# Patient Record
Sex: Female | Born: 1950
Health system: Southern US, Community
[De-identification: ages and names within clinical notes are randomized; demographics above are authoritative.]

## PROBLEM LIST (undated history)

## (undated) DIAGNOSIS — F319 Bipolar disorder, unspecified: Secondary | ICD-10-CM

## (undated) DIAGNOSIS — G250 Essential tremor: Secondary | ICD-10-CM

## (undated) DIAGNOSIS — J45909 Unspecified asthma, uncomplicated: Secondary | ICD-10-CM

## (undated) DIAGNOSIS — E119 Type 2 diabetes mellitus without complications: Secondary | ICD-10-CM

## (undated) DIAGNOSIS — R51 Headache: Secondary | ICD-10-CM

## (undated) DIAGNOSIS — K219 Gastro-esophageal reflux disease without esophagitis: Secondary | ICD-10-CM

## (undated) DIAGNOSIS — E7800 Pure hypercholesterolemia, unspecified: Secondary | ICD-10-CM

## (undated) DIAGNOSIS — K7460 Unspecified cirrhosis of liver: Secondary | ICD-10-CM

## (undated) DIAGNOSIS — F329 Major depressive disorder, single episode, unspecified: Secondary | ICD-10-CM

## (undated) DIAGNOSIS — F32A Depression, unspecified: Secondary | ICD-10-CM

## (undated) DIAGNOSIS — F419 Anxiety disorder, unspecified: Secondary | ICD-10-CM

## (undated) DIAGNOSIS — R519 Headache, unspecified: Secondary | ICD-10-CM

## (undated) DIAGNOSIS — K529 Noninfective gastroenteritis and colitis, unspecified: Secondary | ICD-10-CM

## (undated) HISTORY — DX: Noninfective gastroenteritis and colitis, unspecified: K52.9

## (undated) HISTORY — DX: Anxiety disorder, unspecified: F41.9

## (undated) HISTORY — DX: Headache, unspecified: R51.9

## (undated) HISTORY — DX: Type 2 diabetes mellitus without complications: E11.9

## (undated) HISTORY — DX: Depression, unspecified: F32.A

## (undated) HISTORY — DX: Bipolar disorder, unspecified: F31.9

## (undated) HISTORY — DX: Pure hypercholesterolemia, unspecified: E78.00

## (undated) HISTORY — DX: Unspecified cirrhosis of liver: K74.60

## (undated) HISTORY — DX: Gastro-esophageal reflux disease without esophagitis: K21.9

## (undated) HISTORY — DX: Headache: R51

## (undated) HISTORY — PX: OTHER SURGICAL HISTORY: SHX169

## (undated) HISTORY — DX: Major depressive disorder, single episode, unspecified: F32.9

---
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
## 1995-01-13 HISTORY — PX: ABDOMINAL HYSTERECTOMY: SHX81

## 2001-10-16 ENCOUNTER — Inpatient Hospital Stay (HOSPITAL_COMMUNITY): Admission: AD | Admit: 2001-10-16 | Discharge: 2001-10-17 | Payer: Self-pay | Admitting: Cardiovascular Disease

## 2005-01-12 HISTORY — PX: SKIN CANCER EXCISION: SHX779

## 2005-10-22 ENCOUNTER — Ambulatory Visit (HOSPITAL_COMMUNITY): Payer: Self-pay | Admitting: Psychiatry

## 2005-10-27 ENCOUNTER — Ambulatory Visit (HOSPITAL_COMMUNITY): Payer: Self-pay | Admitting: Psychiatry

## 2005-10-29 ENCOUNTER — Ambulatory Visit (HOSPITAL_COMMUNITY): Payer: Self-pay | Admitting: Psychiatry

## 2005-11-10 ENCOUNTER — Ambulatory Visit (HOSPITAL_COMMUNITY): Payer: Self-pay | Admitting: Psychiatry

## 2005-11-16 ENCOUNTER — Ambulatory Visit (HOSPITAL_COMMUNITY): Payer: Self-pay | Admitting: Psychiatry

## 2008-04-05 ENCOUNTER — Ambulatory Visit (HOSPITAL_COMMUNITY): Payer: Self-pay | Admitting: Psychiatry

## 2008-04-24 ENCOUNTER — Ambulatory Visit (HOSPITAL_COMMUNITY): Payer: Self-pay | Admitting: Psychiatry

## 2008-05-24 ENCOUNTER — Ambulatory Visit (HOSPITAL_COMMUNITY): Payer: Self-pay | Admitting: Psychiatry

## 2008-06-19 ENCOUNTER — Ambulatory Visit (HOSPITAL_COMMUNITY): Payer: Self-pay | Admitting: Psychiatry

## 2008-07-05 ENCOUNTER — Ambulatory Visit (HOSPITAL_COMMUNITY): Payer: Self-pay | Admitting: Psychiatry

## 2008-08-02 ENCOUNTER — Ambulatory Visit (HOSPITAL_COMMUNITY): Payer: Self-pay | Admitting: Psychiatry

## 2008-09-27 ENCOUNTER — Ambulatory Visit (HOSPITAL_COMMUNITY): Payer: Self-pay | Admitting: Psychiatry

## 2008-12-18 ENCOUNTER — Ambulatory Visit (HOSPITAL_COMMUNITY): Payer: Self-pay | Admitting: Psychiatry

## 2009-02-12 ENCOUNTER — Ambulatory Visit (HOSPITAL_COMMUNITY): Payer: Self-pay | Admitting: Psychiatry

## 2009-04-25 ENCOUNTER — Ambulatory Visit (HOSPITAL_COMMUNITY): Payer: Self-pay | Admitting: Psychiatry

## 2009-06-12 HISTORY — PX: OTHER SURGICAL HISTORY: SHX169

## 2009-08-06 ENCOUNTER — Ambulatory Visit (HOSPITAL_COMMUNITY): Payer: Self-pay | Admitting: Psychiatry

## 2009-12-26 ENCOUNTER — Ambulatory Visit (HOSPITAL_COMMUNITY): Payer: Self-pay | Admitting: Psychiatry

## 2010-03-25 ENCOUNTER — Encounter (HOSPITAL_COMMUNITY): Payer: Self-pay | Admitting: Psychiatry

## 2010-03-27 ENCOUNTER — Encounter (HOSPITAL_COMMUNITY): Payer: Self-pay | Admitting: Psychiatry

## 2010-05-30 NOTE — H&P (Signed)
NAME:  BRITTEN, PARADY                    ACCOUNT NO.:  [id number]   MEDICAL RECORD NO.:  [id number]                   PATIENT TYPE:  INP   LOCATION:  2902                                 FACILITY:  [facility]   PHYSICIAN:  Bevelyn Buckles. Bensimhon, M.D. [facility]       DATE OF BIRTH:  10/12/1950   DATE OF ADMISSION:  10/16/2001  DATE OF DISCHARGE:                                HISTORY & PHYSICAL   CHIEF COMPLAINT:  Chest pain.   HISTORY OF PRESENT ILLNESS:  Ms. Montez Morita is a 60 year old white female who  has had two days of intermittent 10/10 chest discomfort. She states that her  discomfort feels like a tightness that is non pleuritic and non positional.  It is not associated with dyspnea, diaphoresis, or palpitations. She has no  radiation of this chest pain.  She states that it is partially relieved with  sitting straight up but not sitting forward.  The pain is not relieved with  Nitroglycerin but Morphine helps a little.   PAST MEDICAL HISTORY:  1. Bipolar disorder.  2. Hysterectomy.  3. Asthma.   ALLERGIES:  No known drug allergies.   MEDICATIONS:  1. Premarin 1.25 mg QD.  2. Xanax 0.5 mg as needed.  3. Risperdal 0.5 mg QHS.  4. Advair 2 puffs twice a day.   SOCIAL HISTORY:  She lives in [place], [place]. with her husband. She works in a  Architectural technologist. She smokes one pack per day and has a 30 pack year  history. She occasionally drinks alcohol. She does not follow a restricted  diet.   FAMILY HISTORY:  Myocardial infarction in her father in his 38's.   REVIEW OF SYSTEMS:  Negative except as per HPI.   PHYSICAL EXAMINATION:  VITAL SIGNS: Temperature 97.6. Blood pressure 94/48.  Heart rate 61.  GENERAL: No acute distress.  HEENT:  Unremarkable.  NECK: Without jugular venous distention. No bruits.  CARDIAC: Regular rate and rhythm. No murmur, rub, or gallop.  LUNGS: Clear.  ABDOMEN: Benign.  EXTREMITIES: No clubbing, cyanosis, or edema.  RECTAL: Heme negative.  NEURO:  Grossly nonfocal.   DIAGNOSTIC STUDIES:  Chest x-ray pending at the time of admission. EKG  revealed normal sinus rhythm and no evidence of ischemia.   LABORATORY DATA:  White count 5.1, hemoglobin and hematocrit 13.2 and 37.  Platelets 190. Sodium 137, potassium 3.7, chloride 106, BUN 14, creatinine  1.0, glucose 109, total bilirubin 0.6, AST 18, ALT 20, alkaline phosphatase  108, albumin 3.5. She had a set of cardiac markers showing CK of 62, MB of  1.7, and troponin I of 0.02.   ASSESSMENT/PLAN:  Atypical chest pain. She was started on Heparin at the  outside facility and we can continue this until she rules out for myocardial  infarction. I do not suspect that she will rule in. We will also continue  aspirin. I will hold the beta blocker and we will try to perform an exercise  Cardiolite  in the morning.                                                 Bevelyn Buckles. Bensimhon, M.D. [facility]    DRB/MEDQ  D:  10/17/2001  T:  10/19/2001  Job:  [id number]

## 2010-05-30 NOTE — Discharge Summary (Signed)
NAME:  Andrea Dickson, Andrea Dickson NO.:  [id number]   MEDICAL RECORD NO.:  [id number]                   PATIENT TYPE:  INP   LOCATION:                                       FACILITY:  [facility]   PHYSICIAN:  Charlton Haws, M.D.                  DATE OF BIRTH:  Jul 13, 1950   DATE OF ADMISSION:  10/16/2001  DATE OF DISCHARGE:  10/17/2001                           DISCHARGE SUMMARY - REFERRING   PROCEDURES:  Cardiolite.   HOSPITAL COURSE:  The patient is a 61 year old female with no known history  of coronary artery disease.  She had intermittent chest discomfort for two  days but no associated symptoms.  Nitroglycerin was not helpful and morphine  helped a little.  The pain was described as a tightness and was non-  pleuritic.  It was non-positional by reports.  She was admitted for followup  evaluation and treatment.   The patient's enzymes were negative overnight and she was scheduled for a  Cardiolite, which was performed on October 18, 2002.  The Cardiolite was  performed under the Bruce protocol and she reached stage 2.  Cardiolite was  injected but her EKG showed some mild ST changes in the inferior leads with  minor T wave changes anteriorly.  There was no significant change, once she  was pain-free.  The patient was evaluated by Dr. Charlies Constable and it was  felt that if the Cardiolite was negative, she could be discharged.   The Cardiolite results showed no ischemia, although there was some breast  attenuation, and her EF was 77%.  A D-dimer was checked to evaluate for  pulmonary embolus and this was less than 0.22.  Since the D-dimer was  negative for pulmonary embolus and there was no significant abnormality on  her chest x-ray and her Cardiolite was negative, she was considered stable  for discharge on February 17, 2001 with outpatient followup.   X-RAY FINDINGS:  Chest x-ray:  Diffuse interstitial prominence with left  lower lobe atelectasis or  scarring.   LABORATORY VALUES:  Hemoglobin 12.5, hematocrit 35.8, WBC 5.5, platelets  163,000.  Sodium 137, potassium 4.0, chloride 105,  CO2 26, BUN 14,  creatinine 1.0, glucose 94.  Total protein 5.4, albumin 2.9, other CMET  values within normal limits.  Total cholesterol 192, triglycerides 280, HDL  40, LDL 96.  TSH 1.141.   DISCHARGE CONDITION:  Stable.   DISCHARGE DIAGNOSES:  1. Chest pain, negative myocardial infarction, negative ischemia by     Cardiolite and negative pulmonary embolus by D-dimer.  2. History of bipolar disorder.  3. History of asthma.  4. Status post hysterectomy.  5. Status post carpal tunnel surgery and tonsillectomy __________.  6. Family history of coronary artery disease.  7. Hypertriglyceridemia.   DISCHARGE INSTRUCTIONS:  1. Activity level is to be as tolerated.  2. She is to follow up with her family physician.  3. She is to stick to a low-fat diet.  4. She is to follow up with cardiology on a p.r.n. basis.   DISCHARGE MEDICATIONS:  1. Motrin 800 mg t.i.d. with food for one week.  2. Xanax 0.5 mg p.r.n.  3. Risperdal 1 mg one-third tablet q.h.s.  4. Advair twice daily.  5. Premarin daily.     Lavella Hammock, P.A. [facility]                  Charlton Haws, M.D.    RG/MEDQ  D:  05/04/2002  T:  05/05/2002  Job:  [id number]   cc:   Charlies Constable, M.D.   [place], [place], [place].D.

## 2010-09-10 DIAGNOSIS — E559 Vitamin D deficiency, unspecified: Secondary | ICD-10-CM | POA: Insufficient documentation

## 2010-11-04 ENCOUNTER — Encounter (INDEPENDENT_AMBULATORY_CARE_PROVIDER_SITE_OTHER): Payer: 59 | Admitting: Psychiatry

## 2010-11-04 DIAGNOSIS — F3189 Other bipolar disorder: Secondary | ICD-10-CM

## 2010-11-05 NOTE — Progress Notes (Signed)
NAME:  Andrea Dickson, Andrea Dickson            ACCOUNT NO.:  [id number]  MEDICAL RECORD NO.:  [id number]  LOCATION:  [facility]                           FACILITY:  [facility]  PHYSICIAN:  Christien Frankl T. Akeria Hedstrom, M.D.   DATE OF BIRTH:  04-11-1950                                PROGRESS NOTE   The patient came in today for her appointment. She was last seen in December 2011.  The patient had hand surgery for her carpal tunnel syndrome.  She is now fully recovered from that.  She has been compliant with her Lamictal and Wellbutrin.  However, lately, she has mentioned that she sometimes gets easily irritable and angry.  The patient reported her mother also noticed that she gets easily agitated.  In the conversation, the patient mentioned that she missed previous appointment, as she was very busy in her life.  She told me that recently she left her husband, as she cannot follow the rules and regulations from him.  This is her 3rd marriage which lasted only 2-1/2 years.  The patient is living with her baby sister.  The patient told that her husband was making her life too difficult.  She was not allowed to see her own child from her previous marriage or to see her relatives. The patient told that since she left him, she is more relaxed, but she is concerned about her mood swings, racing thoughts and poor sleep.  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  CURRENT MEDICATIONS: 1. Nexium 40 mg. 2. Premarin unknown dose. 3. Zetia 10 mg. 4. Vitamin D. 5. Vitamin B12. Her weight today is 175 pounds.  Her medical doctor is Dr. Forde Dandy at [facility].  ALCOHOL AND SUBSTANCE ABUSE HISTORY: The patient denies any history of alcohol or substance abuse.  MENTAL STATUS EXAMINATION: The patient is casually dressed.  She maintains good eye contact.  Her speech is fast and pressured.  Her thought processes are also at times circumstantial but logical.  She denies any active or passive  suicidal thoughts, homicidal thoughts or any auditory or visual hallucinations. There was no paranoia present.  She is alert and oriented times 3.  Her fund of knowledge is adequate.  DIAGNOSES: Axis I:  Bipolar disorder not otherwise specified. Axis II:  Deferred. Axis III:  Gastroesophageal reflux disease, vitamin D and vitamin B12 deficiencies.  See medical history for more details. Axis IV:  Mild to moderate. Axis V:  60-65.  PLAN: I reviewed the chart.  The patient has been tried in the past with Seroquel, Zyprexa, Risperdal, Paxil, Zoloft and Celexa with limited response.  In the past, the patient has been very reluctant to increase her Lamictal but after some discussion, she did agree to try her Lamictal at 150 mg daily.  At this time, the patient reported no side effects of medication including any rash or itching.  She has been getting Wellbutrin XL 300 mg from her primary care doctor.  We will increase her Lamictal to 150 mg daily.  I explained the risks and benefits of medication in detail including any rash.  In that case, she needs to stopped the medicine immediately.  I will see  her again in 4 weeks.  I recommended to call us back if she has any questions or concerns.  We will also get collateral information from her primary care doctor including any recent lab if she has had any done.  The patient is getting medication from [facility].  She recently received a shipment and does not require a new script.  I will see her again in 4 weeks.     Murriel Holwerda T. Lolly Mustache, M.D.     STA/MEDQ  D:  11/04/2010  T:  11/04/2010  Job:  [id number]  Electronically Signed by Kathryne Sharper M.D. on 11/05/2010 03:55:14 PM

## 2010-12-05 ENCOUNTER — Other Ambulatory Visit (HOSPITAL_COMMUNITY): Payer: Self-pay | Admitting: Psychiatry

## 2010-12-05 DIAGNOSIS — F3160 Bipolar disorder, current episode mixed, unspecified: Secondary | ICD-10-CM

## 2010-12-16 ENCOUNTER — Ambulatory Visit (HOSPITAL_COMMUNITY): Payer: 59 | Admitting: Psychiatry

## 2010-12-16 ENCOUNTER — Encounter (HOSPITAL_COMMUNITY): Payer: 59 | Admitting: Psychiatry

## 2010-12-27 ENCOUNTER — Other Ambulatory Visit (HOSPITAL_COMMUNITY): Payer: Self-pay | Admitting: Psychiatry

## 2011-01-13 HISTORY — PX: COLONOSCOPY: SHX174

## 2011-02-13 ENCOUNTER — Other Ambulatory Visit (HOSPITAL_COMMUNITY): Payer: Self-pay | Admitting: Psychiatry

## 2011-09-21 DIAGNOSIS — K219 Gastro-esophageal reflux disease without esophagitis: Secondary | ICD-10-CM | POA: Insufficient documentation

## 2011-09-21 DIAGNOSIS — E782 Mixed hyperlipidemia: Secondary | ICD-10-CM | POA: Insufficient documentation

## 2013-06-15 DIAGNOSIS — F319 Bipolar disorder, unspecified: Secondary | ICD-10-CM | POA: Insufficient documentation

## 2013-06-15 DIAGNOSIS — F17200 Nicotine dependence, unspecified, uncomplicated: Secondary | ICD-10-CM | POA: Insufficient documentation

## 2013-06-15 DIAGNOSIS — R911 Solitary pulmonary nodule: Secondary | ICD-10-CM | POA: Insufficient documentation

## 2013-06-15 DIAGNOSIS — J45909 Unspecified asthma, uncomplicated: Secondary | ICD-10-CM | POA: Insufficient documentation

## 2013-06-15 DIAGNOSIS — J45902 Unspecified asthma with status asthmaticus: Secondary | ICD-10-CM | POA: Insufficient documentation

## 2014-06-15 ENCOUNTER — Telehealth (HOSPITAL_COMMUNITY): Payer: Self-pay | Admitting: *Deleted

## 2014-07-02 ENCOUNTER — Telehealth (HOSPITAL_COMMUNITY): Payer: Self-pay | Admitting: *Deleted

## 2014-07-10 ENCOUNTER — Ambulatory Visit (HOSPITAL_COMMUNITY): Payer: Self-pay | Admitting: Psychology

## 2014-07-26 ENCOUNTER — Ambulatory Visit (INDEPENDENT_AMBULATORY_CARE_PROVIDER_SITE_OTHER): Payer: 59 | Admitting: Psychology

## 2014-07-26 DIAGNOSIS — F3110 Bipolar disorder, current episode manic without psychotic features, unspecified: Secondary | ICD-10-CM

## 2014-07-31 ENCOUNTER — Ambulatory Visit (INDEPENDENT_AMBULATORY_CARE_PROVIDER_SITE_OTHER): Payer: 59 | Admitting: Psychiatry

## 2014-07-31 ENCOUNTER — Encounter (HOSPITAL_COMMUNITY): Payer: Self-pay | Admitting: Psychiatry

## 2014-07-31 VITALS — BP 148/87 | Ht 66.0 in | Wt 175.0 lb

## 2014-07-31 DIAGNOSIS — F3110 Bipolar disorder, current episode manic without psychotic features, unspecified: Secondary | ICD-10-CM | POA: Diagnosis not present

## 2014-07-31 MED ORDER — LAMOTRIGINE 100 MG PO TABS: 100.0000 mg | ORAL_TABLET | Freq: Two times a day (BID) | ORAL | Status: DC

## 2014-07-31 MED ORDER — CLONAZEPAM 0.5 MG PO TABS: 0.5000 mg | ORAL_TABLET | Freq: Two times a day (BID) | ORAL | Status: DC | PRN
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
## 2014-08-31 ENCOUNTER — Encounter (HOSPITAL_COMMUNITY): Payer: Self-pay | Admitting: Psychiatry

## 2014-08-31 ENCOUNTER — Ambulatory Visit (INDEPENDENT_AMBULATORY_CARE_PROVIDER_SITE_OTHER): Payer: 59 | Admitting: Psychiatry

## 2014-08-31 VITALS — BP 144/76 | HR 84 | Ht 66.0 in | Wt 171.0 lb

## 2014-08-31 DIAGNOSIS — F3110 Bipolar disorder, current episode manic without psychotic features, unspecified: Secondary | ICD-10-CM

## 2014-08-31 MED ORDER — CARBAMAZEPINE ER 200 MG PO TB12: 200.0000 mg | ORAL_TABLET | Freq: Two times a day (BID) | ORAL | Status: DC
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
## 2014-09-10 ENCOUNTER — Telehealth (HOSPITAL_COMMUNITY): Payer: Self-pay | Admitting: *Deleted

## 2014-09-10 NOTE — Telephone Encounter (Signed)
phone call from patient, her meds were changed from Lamictal to Tegretol XR 200 mg.  she is having headaches and Tegretol makes her sick.

## 2014-09-11 NOTE — Telephone Encounter (Signed)
phone call from patient, her meds were changed from Lamictal to Tegretol XR 200 mg. she is having headaches and Tegretol makes her sick. Pt [id number]. Per pt, she stopped taking it.

## 2014-09-11 NOTE — Telephone Encounter (Signed)
noted 

## 2014-09-11 NOTE — Telephone Encounter (Signed)
Pt states she is still having severe headaches, urged her to call PCP

## 2014-09-24 DIAGNOSIS — G44219 Episodic tension-type headache, not intractable: Secondary | ICD-10-CM | POA: Insufficient documentation

## 2014-09-25 ENCOUNTER — Telehealth (HOSPITAL_COMMUNITY): Payer: Self-pay | Admitting: *Deleted
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
## 2014-09-25 NOTE — Telephone Encounter (Signed)
phone call from patient, her head was hurting, tension & stress at work.

## 2014-09-25 NOTE — Telephone Encounter (Signed)
lmtcb number provided 

## 2014-09-25 NOTE — Telephone Encounter (Signed)
She will need to come in to discuss, bring FMLA paperwork

## 2014-09-26 NOTE — Telephone Encounter (Signed)
Pt showed understanding. Offered 09-27-14 time slot and pt stated she will take that time slot but she have to see if her FMLA paperwork get mails to her. Per pt, she will call office back and inform office if her FMLA came and if not she will cancel appt and keep her appt for the 22nd.

## 2014-09-26 NOTE — Telephone Encounter (Signed)
lmtcb number provided 

## 2014-09-27 ENCOUNTER — Ambulatory Visit (HOSPITAL_COMMUNITY): Payer: Self-pay | Admitting: Psychiatry

## 2014-10-04 ENCOUNTER — Encounter (HOSPITAL_COMMUNITY): Payer: Self-pay | Admitting: Psychiatry

## 2014-10-04 ENCOUNTER — Ambulatory Visit (INDEPENDENT_AMBULATORY_CARE_PROVIDER_SITE_OTHER): Payer: 59 | Admitting: Psychiatry

## 2014-10-04 VITALS — BP 149/80 | HR 86 | Ht 66.0 in | Wt 172.8 lb

## 2014-10-04 DIAGNOSIS — F3110 Bipolar disorder, current episode manic without psychotic features, unspecified: Secondary | ICD-10-CM | POA: Diagnosis not present
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
## 2014-10-09 ENCOUNTER — Encounter: Payer: Self-pay | Admitting: *Deleted

## 2014-10-10 ENCOUNTER — Encounter: Payer: Self-pay | Admitting: Diagnostic Neuroimaging

## 2014-10-10 ENCOUNTER — Ambulatory Visit (INDEPENDENT_AMBULATORY_CARE_PROVIDER_SITE_OTHER): Payer: Managed Care, Other (non HMO) | Admitting: Diagnostic Neuroimaging

## 2014-10-10 VITALS — BP 124/76 | HR 88 | Ht 66.5 in | Wt 176.0 lb

## 2014-10-10 DIAGNOSIS — G43009 Migraine without aura, not intractable, without status migrainosus: Secondary | ICD-10-CM

## 2014-10-10 NOTE — Progress Notes (Signed)
[facility]  PATIENT: Andrea Dickson DOB: Jul 20, 1950  REFERRING CLINICIAN: Burdine, S HISTORY FROM: patient  REASON FOR VISIT: new consult    HISTORICAL  CHIEF COMPLAINT:  No chief complaint on file.   HISTORY OF PRESENT ILLNESS:   64 year old female here for evaluation of headaches. For past to 3 months patient has had band of pressure and pain, sometimes left-sided throbbing headaches with nausea, dizziness. No photophobia or phonophobia. Patient also has significant insomnia, sleeping 4-5 hours per night. She has had increasing stress and work related responsibilities and duties since April and May 2016. Patient has significant depression, anxiety, racing thoughts. Patient's bipolar medications have been adjusted over the past 6 months. Lamotrigine was tapered off and then Tegretol was started. However this caused side effects and now patient is going back onto the lamotrigine. Amitriptyline has also been started to help with headaches. Patient tried Imitrex but this was ineffective. She tried Medrol Dosepak which helped relieve but not stopped the headaches. Hydrocodone has slightly ease headaches but not fully helping. Patient is now missing work due to significant headaches.   REVIEW OF SYSTEMS: Full 14 system review of systems performed and notable only for weight gain fatigue confusion headache sleepiness dizziness allergies moles not asleep decreased energy.  ALLERGIES: No Known Allergies  HOME MEDICATIONS: Outpatient Prescriptions Prior to Visit  Medication Sig Dispense Refill  . amitriptyline (ELAVIL) 50 MG tablet Take 50 mg by mouth at bedtime.    Marland Kitchen buPROPion (WELLBUTRIN XL) 300 MG 24 hr tablet Take 300 mg by mouth daily.    . clonazePAM (KLONOPIN) 0.5 MG tablet Take 1 tablet (0.5 mg total) by mouth 2 (two) times daily as needed for anxiety. 60 tablet 2  . estrogens, conjugated, (PREMARIN) 1.25 MG tablet Take 1.25 mg by mouth daily.    Marland Kitchen  HYDROcodone-acetaminophen (NORCO) 7.5-325 MG per tablet Take 1 tablet by mouth every 6 (six) hours as needed for moderate pain.     No facility-administered medications prior to visit.    PAST MEDICAL HISTORY: Past Medical History  Diagnosis Date  . Headache   . Depression   . Anxiety   . Bipolar 1 disorder     PAST SURGICAL HISTORY: Past Surgical History  Procedure Laterality Date  . Abdominal hysterectomy  1997  . Carpal tunnel Bilateral 1999, 06/2009  . Ulner nerve   06/2009  . Skin cancer excision  2007  . Tonsillectomy  1965    FAMILY HISTORY: Family History  Problem Relation Age of Onset  . Bipolar disorder Brother   . Diabetes Brother   . Diabetes Father   . Heart attack Father   . Diabetes Sister     SOCIAL HISTORY:  Social History   Social History  . Marital Status: Unknown    Spouse Name: N/A  . Number of Children: 1  . Years of Education: 12   Occupational History  .      employed at [employer] Topics  . Smoking status: Current Every Day Smoker -- 0.30 packs/day for 43 years    Types: Cigarettes  . Smokeless tobacco: Not on file  . Alcohol Use: No  . Drug Use: No  . Sexual Activity: Not Currently   Other Topics Concern  . Not on file   Social History Narrative   Lives alone   Caffeine use- sodas, 2 daily     PHYSICAL EXAM  GENERAL EXAM/CONSTITUTIONAL: Vitals:  Filed Vitals:  10/10/14 1420  BP: 124/76  Pulse: 88  Height: 5' 6.5" (1.689 m)  Weight: 176 lb (79.833 kg)     Body mass index is 27.98 kg/(m^2).  Visual Acuity Screening   Right eye Left eye Both eyes  Without correction: 20/30 20/20   With correction:        Patient is in no distress; well developed, nourished and groomed; neck is supple  CARDIOVASCULAR:  Examination of carotid arteries is normal; no carotid bruits  Regular rate and rhythm, no murmurs  Examination of peripheral vascular system by observation and palpation is  normal  EYES:  Ophthalmoscopic exam of optic discs and posterior segments is normal; no papilledema or hemorrhages  MUSCULOSKELETAL:  Gait, strength, tone, movements noted in Neurologic exam below  NEUROLOGIC: MENTAL STATUS:  No flowsheet data found.  awake, alert, oriented to person, place and time  recent and remote memory intact  normal attention and concentration  language fluent, comprehension intact, naming intact,   fund of knowledge appropriate  CRANIAL NERVE:   2nd - no papilledema on fundoscopic exam  2nd, 3rd, 4th, 6th - pupils equal and reactive to light, visual fields full to confrontation, extraocular muscles intact, no nystagmus  5th - facial sensation symmetric  7th - facial strength symmetric  8th - hearing intact  9th - palate elevates symmetrically, uvula midline  11th - shoulder shrug symmetric  12th - tongue protrusion midline  MOTOR:   normal bulk and tone, full strength in the BUE, BLE  SENSORY:   normal and symmetric to light touch, temperature, vibration   COORDINATION:   finger-nose-finger, fine finger movements normal  REFLEXES:   deep tendon reflexes present and symmetric  GAIT/STATION:   narrow based gait; able to walk on toes, heels and tandem; romberg is negative    DIAGNOSTIC DATA (LABS, IMAGING, TESTING) - I reviewed patient records, labs, notes, testing and imaging myself where available.  No results found for: WBC, HGB, HCT, MCV, PLT No results found for: NA, K, CL, CO2, GLUCOSE, BUN, CREATININE, CALCIUM, PROT, ALBUMIN, AST, ALT, ALKPHOS, BILITOT, GFRNONAA, GFRAA No results found for: CHOL, HDL, LDLCALC, LDLDIRECT, TRIG, CHOLHDL No results found for: HGBA1C No results found for: VITAMINB12 No results found for: TSH  10/01/14 MRI brain [I reviewed report only; images note available. Performed at [facility] diagnostic imaging. -VRP]  - Few tiny white matter hyperintensities which may reflect small vessel ischemic  changes of aging. Other demyelinating conditions such as MS should be considered as well.    ASSESSMENT AND PLAN  64 y.o. year old female here with new onset headaches with migraine features. Neurologic examination and MRI are unremarkable. Patient does have long history of bipolar disorder, insomnia, stress, which may be contributing factors. Patient now on amitriptyline which seems to be helping with her headaches.  Dx: Migraine without aura and without status migrainosus, not intractable    PLAN: - continue amitriptyline; may need to titrate up over time - nutrition, sleep hygeine, exercise advice reviewed - may consider migraine IV cocktail if HA persist  Return in about 6 weeks (around 11/21/2014).    Penni Bombard, MD 3/38/2505, 3:97 PM Certified in Neurology, Neurophysiology and Neuroimaging  [facility] [address], [place], [place] [address] [phone]

## 2014-10-10 NOTE — Patient Instructions (Addendum)
Thank you for coming to see [place] at [facility]. I hope we have been able to provide you high quality care today.  You may receive a patient satisfaction survey over the next few weeks. We would appreciate your feedback and comments so that we may continue to improve ourselves and the health of our patients.  - continue amitriptyline - start headache diary on a calender   ~~~~~~~~~~~~~~~~~~~~~~~~~~~~~~~~~~~~~~~~~~~~~~~~~~~~~~~~~~~~~~~~~  DR. PENUMALLI'S GUIDE TO HAPPY AND HEALTHY LIVING These are some of my general health and wellness recommendations. Some of them may apply to you better than others. Please use common sense as you try these suggestions and feel free to ask me any questions.   ACTIVITY/FITNESS Mental, social, emotional and physical stimulation are very important for brain and body health. Try learning a new activity (arts, music, language, sports, games).  Keep moving your body to the best of your abilities. You can do this at home, inside or outside, the park, community center, gym or anywhere you like. Consider a physical therapist or personal trainer to get started. Consider the app Sworkit. Fitness trackers such as smart-watches, smart-phones or Fitbits can help as well.   NUTRITION Eat more plants: colorful vegetables, nuts, seeds and berries.  Eat less sugar, salt, preservatives and processed foods.  Avoid toxins such as cigarettes and alcohol.  Drink water when you are thirsty. Warm water with a slice of lemon is an excellent morning drink to start the day.  Consider these websites for more information The Nutrition Source (https://www.henry-hernandez.biz/) Precision Nutrition (WindowBlog.ch)   RELAXATION Consider practicing mindfulness meditation or other relaxation techniques such as deep breathing, prayer, yoga, tai chi, massage. See website mindful.org or the apps Headspace or Calm to help get  started.   SLEEP Try to get at least 7-8+ hours sleep per day. Regular exercise and reduced caffeine will help you sleep better. Practice good sleep hygeine techniques. See website sleep.org for more information.   PLANNING Prepare estate planning, living will, healthcare POA documents. Sometimes this is best planned with the help of an attorney. Theconversationproject.org and agingwithdignity.org are excellent resources.

## 2014-10-15 ENCOUNTER — Telehealth (HOSPITAL_COMMUNITY): Payer: Self-pay | Admitting: *Deleted

## 2014-10-15 NOTE — Telephone Encounter (Signed)
phone call from patient, did Dr. Harrington Challenger complete disability for [employer].   Harle Battiest put it in her box.

## 2014-10-16 NOTE — Telephone Encounter (Signed)
Form was completed and was [facility] faxed to [employer] by Butch Penny and on 10-16-14, Ruby called them to confirm if they received the fax and they confirmed that they did on the 10-09-14

## 2014-10-29 ENCOUNTER — Telehealth: Payer: Self-pay | Admitting: Diagnostic Neuroimaging

## 2014-10-29 NOTE — Telephone Encounter (Signed)
Spoke with patient who states she has also taken Aleve, Ibuprofen 800 mg, and/or Klonopin prn to help with HA. She states none have been very helpful. Informed her that in her OV on 10/10/14 Dr Leta Baptist stated he may have to titrate Elavil. Informed her would send to Dr Leta Baptist and call her back either by end of day today or tomorrow with his recommendations. Confirmed her pharmacy as [facility]. She verbalized understanding, appreciation.

## 2014-10-29 NOTE — Telephone Encounter (Signed)
Can go up to amitriptyline 100mg  at bedtime. Also can come in for migraine cocktail infusions. -VRP

## 2014-10-29 NOTE — Telephone Encounter (Signed)
Per Dr Leta Baptist, spoke with patient and informed her to increase Amitriptyline to 100 mg at night and see if her HA resolves. Also informed her she may come into office for migraine cocktail. She states she will try increasing medication first, will call back as needed. She verbalized understanding, appreciation.

## 2014-10-29 NOTE — Telephone Encounter (Signed)
Pt called requesting medication for HA's. Pt said amitriptyline (ELAVIL) 50 MG tablet is not helping. She said it eases pain but never stops it and HA has been constant past 2 days. Please call and advise [phone]. The pharmacy is [facility] in [place], [place]

## 2014-11-06 ENCOUNTER — Encounter (HOSPITAL_COMMUNITY): Payer: Self-pay | Admitting: Psychiatry

## 2014-11-06 ENCOUNTER — Ambulatory Visit (INDEPENDENT_AMBULATORY_CARE_PROVIDER_SITE_OTHER): Payer: 59 | Admitting: Psychiatry

## 2014-11-06 VITALS — BP 155/81 | HR 89 | Ht 66.0 in | Wt 174.8 lb

## 2014-11-06 DIAGNOSIS — F3110 Bipolar disorder, current episode manic without psychotic features, unspecified: Secondary | ICD-10-CM

## 2014-11-06 MED ORDER — CLONAZEPAM 1 MG PO TABS: 1.0000 mg | ORAL_TABLET | Freq: Three times a day (TID) | ORAL | Status: DC

## 2014-11-06 MED ORDER — BUPROPION HCL ER (XL) 300 MG PO TB24: 300.0000 mg | ORAL_TABLET | Freq: Every day | ORAL | Status: DC
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
## 2014-11-19 ENCOUNTER — Telehealth: Payer: Self-pay | Admitting: Diagnostic Neuroimaging

## 2014-11-19 NOTE — Telephone Encounter (Signed)
Patient called to talk to Otila Kluver about infusion, Otila Kluver is out of the office today. Patient will call back to coordinate infusion along with reschedule of previously cancelled appointment.

## 2014-11-21 ENCOUNTER — Ambulatory Visit: Payer: Managed Care, Other (non HMO) | Admitting: Diagnostic Neuroimaging

## 2014-11-21 NOTE — Telephone Encounter (Signed)
Spoke with patient and informed her that Dr Leta Baptist ordered her migraine infusion. Also informed her that Otila Kluver stated she could give the infusion tomorrow at 9 am. Patient states she has out of town guests in her home due to recent death in family. She requests Otila Kluver call her tomorrow morning to schedule. Advised this RN will inform Otila Kluver when she returns to this office tomorrow. Patient verbalized understanding, appreciation.

## 2014-11-21 NOTE — Telephone Encounter (Signed)
Patient returned call and states she is continues to have headaches daily, worse at times. She states that her HA are worse when she is in crowds. She states she has nausea off and on. Informed her would route her request to Dr Leta Baptist to enter orders. She should expect a call back from [place], infusion RN to schedule her migraine infusion. She verbalized understanding, appreciation.

## 2014-11-21 NOTE — Telephone Encounter (Signed)
Left VM requesting patient call back re: phone call she made on 11/19/14. Left this caller's name, number.

## 2014-11-21 NOTE — Telephone Encounter (Signed)
Yes, go ahead with infusion. -VRP

## 2014-11-23 ENCOUNTER — Telehealth (HOSPITAL_COMMUNITY): Payer: Self-pay | Admitting: *Deleted

## 2014-12-05 ENCOUNTER — Ambulatory Visit (INDEPENDENT_AMBULATORY_CARE_PROVIDER_SITE_OTHER): Payer: 59 | Admitting: Psychiatry

## 2014-12-05 ENCOUNTER — Encounter (HOSPITAL_COMMUNITY): Payer: Self-pay | Admitting: Psychiatry

## 2014-12-05 VITALS — BP 144/80 | Ht 66.0 in | Wt 173.0 lb

## 2014-12-05 DIAGNOSIS — F3110 Bipolar disorder, current episode manic without psychotic features, unspecified: Secondary | ICD-10-CM

## 2014-12-05 MED ORDER — LAMOTRIGINE 100 MG PO TABS: 100.0000 mg | ORAL_TABLET | Freq: Two times a day (BID) | ORAL | Status: DC

## 2014-12-05 MED ORDER — DIAZEPAM 10 MG PO TABS: 10.0000 mg | ORAL_TABLET | Freq: Three times a day (TID) | ORAL | Status: DC
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
## 2014-12-10 ENCOUNTER — Telehealth (HOSPITAL_COMMUNITY): Payer: Self-pay | Admitting: *Deleted

## 2014-12-10 NOTE — Telephone Encounter (Signed)
phone call from patient stating the [facility] need something stating what last visit was and that doctor still has her out of work until Jan. 5, 2017.

## 2014-12-17 NOTE — Telephone Encounter (Signed)
We can send them the last office note, usually they have a form to fill out

## 2014-12-28 ENCOUNTER — Encounter: Payer: Self-pay | Admitting: Diagnostic Neuroimaging

## 2015-01-17 ENCOUNTER — Ambulatory Visit (INDEPENDENT_AMBULATORY_CARE_PROVIDER_SITE_OTHER): Payer: 59 | Admitting: Psychiatry

## 2015-01-17 ENCOUNTER — Encounter (HOSPITAL_COMMUNITY): Payer: Self-pay | Admitting: Psychiatry

## 2015-01-17 VITALS — BP 131/72 | HR 78 | Ht 66.0 in | Wt 173.4 lb

## 2015-01-17 DIAGNOSIS — F3110 Bipolar disorder, current episode manic without psychotic features, unspecified: Secondary | ICD-10-CM | POA: Diagnosis not present

## 2015-01-17 MED ORDER — METHYLPHENIDATE HCL 10 MG PO TABS: 10.0000 mg | ORAL_TABLET | Freq: Two times a day (BID) | ORAL | Status: DC

## 2015-01-17 MED ORDER — DIAZEPAM 10 MG PO TABS: 10.0000 mg | ORAL_TABLET | Freq: Three times a day (TID) | ORAL | Status: DC

## 2015-01-17 MED ORDER — LAMOTRIGINE 100 MG PO TABS: 100.0000 mg | ORAL_TABLET | Freq: Two times a day (BID) | ORAL | Status: DC

## 2015-01-17 MED ORDER — BUPROPION HCL ER (XL) 300 MG PO TB24: 300.0000 mg | ORAL_TABLET | Freq: Every day | ORAL | Status: DC
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
## 2015-02-05 ENCOUNTER — Telehealth (HOSPITAL_COMMUNITY): Payer: Self-pay | Admitting: *Deleted

## 2015-02-05 NOTE — Telephone Encounter (Signed)
Spoke with pt to sch appt so she could fill out paperwork from [facility] which is due 02-12-15. Per pt, she will just keep the 02-14-15 appt and call her worker at [facility] and let them know when her next appt is and that's when she will get it filled out.

## 2015-02-12 ENCOUNTER — Telehealth (HOSPITAL_COMMUNITY): Payer: Self-pay | Admitting: *Deleted

## 2015-02-14 ENCOUNTER — Encounter (HOSPITAL_COMMUNITY): Payer: Self-pay | Admitting: Psychiatry

## 2015-02-14 ENCOUNTER — Ambulatory Visit (INDEPENDENT_AMBULATORY_CARE_PROVIDER_SITE_OTHER): Payer: Self-pay | Admitting: Psychiatry

## 2015-02-14 VITALS — BP 126/101 | HR 86 | Ht 66.0 in | Wt 176.4 lb

## 2015-02-14 DIAGNOSIS — F3110 Bipolar disorder, current episode manic without psychotic features, unspecified: Secondary | ICD-10-CM

## 2015-02-14 MED ORDER — METHYLPHENIDATE HCL 10 MG PO TABS: 10.0000 mg | ORAL_TABLET | Freq: Two times a day (BID) | ORAL | Status: DC

## 2015-02-14 MED ORDER — LAMOTRIGINE 100 MG PO TABS: 100.0000 mg | ORAL_TABLET | Freq: Two times a day (BID) | ORAL | Status: DC

## 2015-02-14 MED ORDER — BUPROPION HCL ER (XL) 300 MG PO TB24: 300.0000 mg | ORAL_TABLET | Freq: Every day | ORAL | Status: DC
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
## 2015-02-18 ENCOUNTER — Ambulatory Visit (INDEPENDENT_AMBULATORY_CARE_PROVIDER_SITE_OTHER): Payer: Self-pay | Admitting: Psychology

## 2015-02-18 DIAGNOSIS — F3110 Bipolar disorder, current episode manic without psychotic features, unspecified: Secondary | ICD-10-CM

## 2015-03-06 ENCOUNTER — Telehealth (HOSPITAL_COMMUNITY): Payer: Self-pay | Admitting: *Deleted

## 2015-03-12 ENCOUNTER — Ambulatory Visit (INDEPENDENT_AMBULATORY_CARE_PROVIDER_SITE_OTHER): Payer: Self-pay | Admitting: Psychology

## 2015-03-12 DIAGNOSIS — F3110 Bipolar disorder, current episode manic without psychotic features, unspecified: Secondary | ICD-10-CM

## 2015-03-13 ENCOUNTER — Telehealth (HOSPITAL_COMMUNITY): Payer: Self-pay | Admitting: *Deleted

## 2015-03-13 NOTE — Telephone Encounter (Signed)
peer to peer review regarding Andrea Dickson at 4:00 on 03/14/15

## 2015-03-14 ENCOUNTER — Ambulatory Visit (HOSPITAL_COMMUNITY): Payer: Self-pay | Admitting: Psychiatry

## 2015-03-14 NOTE — Telephone Encounter (Signed)
done

## 2015-03-18 ENCOUNTER — Telehealth (HOSPITAL_COMMUNITY): Payer: Self-pay | Admitting: *Deleted

## 2015-03-19 ENCOUNTER — Ambulatory Visit (HOSPITAL_COMMUNITY): Payer: Self-pay | Admitting: Psychiatry

## 2015-04-04 ENCOUNTER — Emergency Department (HOSPITAL_COMMUNITY)
Admission: EM | Admit: 2015-04-04 | Discharge: 2015-04-05 | Disposition: A | Discharge status: Other Acute Inpatient Hospital | Payer: Self-pay | Attending: Emergency Medicine | Admitting: Emergency Medicine

## 2015-04-04 ENCOUNTER — Encounter (HOSPITAL_COMMUNITY): Payer: Self-pay | Admitting: Emergency Medicine

## 2015-04-04 ENCOUNTER — Ambulatory Visit (INDEPENDENT_AMBULATORY_CARE_PROVIDER_SITE_OTHER): Payer: Self-pay | Admitting: Psychiatry

## 2015-04-04 ENCOUNTER — Encounter (HOSPITAL_COMMUNITY): Payer: Self-pay | Admitting: Psychiatry

## 2015-04-04 VITALS — BP 124/82 | Ht 66.0 in | Wt 174.0 lb

## 2015-04-04 DIAGNOSIS — F17210 Nicotine dependence, cigarettes, uncomplicated: Secondary | ICD-10-CM | POA: Insufficient documentation

## 2015-04-04 DIAGNOSIS — F3110 Bipolar disorder, current episode manic without psychotic features, unspecified: Secondary | ICD-10-CM

## 2015-04-04 DIAGNOSIS — F319 Bipolar disorder, unspecified: Secondary | ICD-10-CM | POA: Insufficient documentation

## 2015-04-04 DIAGNOSIS — F3160 Bipolar disorder, current episode mixed, unspecified: Secondary | ICD-10-CM

## 2015-04-04 DIAGNOSIS — Z79899 Other long term (current) drug therapy: Secondary | ICD-10-CM | POA: Insufficient documentation

## 2015-04-04 DIAGNOSIS — R45851 Suicidal ideations: Secondary | ICD-10-CM | POA: Insufficient documentation

## 2015-04-04 LAB — COMPREHENSIVE METABOLIC PANEL
ALBUMIN: 4.2 g/dL (ref 3.5–5.0)
ALK PHOS: 103 U/L (ref 38–126)
ALT: 30 U/L (ref 14–54)
ANION GAP: 8 (ref 5–15)
AST: 43 U/L — ABNORMAL HIGH (ref 15–41)
BILIRUBIN TOTAL: 0.7 mg/dL (ref 0.3–1.2)
BUN: 12 mg/dL (ref 6–20)
CALCIUM: 8.9 mg/dL (ref 8.9–10.3)
CO2: 26 mmol/L (ref 22–32)
Chloride: 100 mmol/L — ABNORMAL LOW (ref 101–111)
Creatinine, Ser: 0.8 mg/dL (ref 0.44–1.00)
GFR calc non Af Amer: >60 mL/min (ref >60)
GLUCOSE: 92 mg/dL (ref 65–99)
POTASSIUM: 4.1 mmol/L (ref 3.5–5.1)
SODIUM: 134 mmol/L — AB (ref 135–145)
TOTAL PROTEIN: 7.2 g/dL (ref 6.5–8.1)

## 2015-04-04 LAB — CBC
HEMATOCRIT: 41.6 % (ref 36.0–46.0)
Hemoglobin: 14.8 g/dL (ref 12.0–15.0)
MCH: 31.8 pg (ref 26.0–34.0)
MCHC: 35.6 g/dL (ref 30.0–36.0)
MCV: 89.5 fL (ref 78.0–100.0)
PLATELETS: 188 10*3/uL (ref 150–400)
RBC: 4.65 MIL/uL (ref 3.87–5.11)
RDW: 13.2 % (ref 11.5–15.5)
WBC: 6.6 10*3/uL (ref 4.0–10.5)

## 2015-04-04 LAB — RAPID URINE DRUG SCREEN, HOSP PERFORMED
AMPHETAMINES: NOT DETECTED
BENZODIAZEPINES: POSITIVE — AB
Barbiturates: NOT DETECTED
COCAINE: NOT DETECTED
OPIATES: NOT DETECTED
Tetrahydrocannabinol: NOT DETECTED

## 2015-04-04 LAB — ACETAMINOPHEN LEVEL

## 2015-04-04 LAB — SALICYLATE LEVEL

## 2015-04-04 LAB — ETHANOL: Alcohol, Ethyl (B): <5 mg/dL (ref <5)

## 2015-04-04 MED ORDER — AMITRIPTYLINE HCL 50 MG PO TABS
50.0000 mg | ORAL_TABLET | Freq: Every evening | ORAL | Status: DC | PRN
  Filled 2015-04-04: qty 1

## 2015-04-04 MED ORDER — DIAZEPAM 5 MG PO TABS
10.0000 mg | ORAL_TABLET | Freq: Three times a day (TID) | ORAL | Status: DC
  Administered 2015-04-05: 10 mg via ORAL
  Filled 2015-04-04: qty 2

## 2015-04-04 MED ORDER — BUPROPION HCL ER (XL) 300 MG PO TB24
300.0000 mg | ORAL_TABLET | Freq: Every day | ORAL | Status: DC
  Administered 2015-04-05: 300 mg via ORAL
  Filled 2015-04-04 (×4): qty 1

## 2015-04-04 MED ORDER — LAMOTRIGINE 25 MG PO TABS
100.0000 mg | ORAL_TABLET | Freq: Two times a day (BID) | ORAL | Status: DC
  Administered 2015-04-05: 100 mg via ORAL
  Filled 2015-04-04 (×2): qty 4

## 2015-04-04 MED ORDER — ESTROGENS CONJUGATED 1.25 MG PO TABS
1.2500 mg | ORAL_TABLET | Freq: Every day | ORAL | Status: DC
  Administered 2015-04-05: 1.25 mg via ORAL
  Filled 2015-04-04 (×4): qty 1

## 2015-04-04 MED ORDER — METHYLPHENIDATE HCL 5 MG PO TABS
10.0000 mg | ORAL_TABLET | Freq: Two times a day (BID) | ORAL | Status: DC
  Administered 2015-04-05: 10 mg via ORAL
  Filled 2015-04-04: qty 2

## 2015-04-04 MED ORDER — PROMETHAZINE HCL 12.5 MG PO TABS
25.0000 mg | ORAL_TABLET | Freq: Four times a day (QID) | ORAL | Status: DC | PRN
Start: 2015-04-04 — End: 2015-04-05

## 2015-04-04 NOTE — ED Notes (Signed)
pt reports she doesnt know at this time if she is suicidal, pt reported the other day that she was going to OD on pills the other day to end it all.  Pt wanded by security.
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
## 2015-04-04 NOTE — ED Notes (Signed)
Pt asked to go outside and smoke, pt told that she was to stay in waiting room behind triage with family at this time.

## 2015-04-04 NOTE — ED Provider Notes (Signed)
CSN: [id number]     Arrival date & time 04/04/15  94 History   First MD Initiated Contact with Patient 04/04/15 1723     Chief Complaint  Patient presents with  . V70.1     (Consider location/radiation/quality/duration/timing/severity/associated sxs/prior Treatment) Patient is a 65 y.o. female presenting with altered mental status. The history is provided by the patient (Patient states she has not been taking her medicines for bipolar. She has not been sleeping and she's been thinking about hurting herself).  Altered Mental Status Presenting symptoms: behavior changes   Severity:  Severe Most recent episode:  2 days ago Timing:  Constant Progression:  Worsening Chronicity:  Recurrent Associated symptoms: agitation   Associated symptoms: no abdominal pain, no hallucinations, no headaches, no rash and no seizures     Past Medical History  Diagnosis Date  . Headache   . Depression   . Anxiety   . Bipolar 1 disorder [facility])    Past Surgical History  Procedure Laterality Date  . Abdominal hysterectomy  1997  . Carpal tunnel Bilateral 1999, 06/2009  . Ulner nerve   06/2009  . Skin cancer excision  2007  . Tonsillectomy  1965   Family History  Problem Relation Age of Onset  . Bipolar disorder Brother   . Diabetes Brother   . Diabetes Father   . Heart attack Father   . Diabetes Sister    Social History  Substance Use Topics  . Smoking status: Current Every Day Smoker -- 0.30 packs/day for 43 years    Types: Cigarettes  . Smokeless tobacco: None  . Alcohol Use: No   OB History    No data available     Review of Systems  Constitutional: Negative for appetite change and fatigue.  HENT: Negative for congestion, ear discharge and sinus pressure.   Eyes: Negative for discharge.  Respiratory: Negative for cough.   Cardiovascular: Negative for chest pain.  Gastrointestinal: Negative for abdominal pain and diarrhea.  Genitourinary: Negative for frequency and hematuria.   Musculoskeletal: Negative for back pain.  Skin: Negative for rash.  Neurological: Negative for seizures and headaches.  Psychiatric/Behavioral: Positive for agitation. Negative for hallucinations. The patient is hyperactive.       Allergies  Review of patient's allergies indicates no known allergies.  Home Medications   Prior to Admission medications   Medication Sig Start Date End Date Taking? Authorizing Provider  amitriptyline (ELAVIL) 50 MG tablet Take 50 mg by mouth at bedtime as needed for sleep.    Yes Historical Provider, MD  buPROPion (WELLBUTRIN XL) 300 MG 24 hr tablet Take 1 tablet (300 mg total) by mouth daily. 02/14/15  Yes Cloria Spring, MD  diazepam (VALIUM) 10 MG tablet Take 1 tablet (10 mg total) by mouth 3 (three) times daily. 01/17/15  Yes Cloria Spring, MD  estrogens, conjugated, (PREMARIN) 1.25 MG tablet Take 1.25 mg by mouth daily.   Yes Historical Provider, MD  lamoTRIgine (LAMICTAL) 100 MG tablet Take 1 tablet (100 mg total) by mouth 2 (two) times daily. 02/14/15  Yes Cloria Spring, MD  methylphenidate (RITALIN) 10 MG tablet Take 1 tablet (10 mg total) by mouth 2 (two) times daily with breakfast and lunch. 02/14/15 02/14/16 Yes Cloria Spring, MD  promethazine (PHENERGAN) 25 MG tablet Take 25 mg by mouth every 6 (six) hours as needed for nausea or vomiting. 10/10/14 every 4-6 hr prn headache   Yes Historical Provider, MD   BP 130/59 mmHg  Pulse  86  Temp(Src) 98.5 F (36.9 C) (Oral)  Resp 16  Ht 5\' 6"  (1.676 m)  Wt 174 lb (78.926 kg)  BMI 28.10 kg/m2  SpO2 98% Physical Exam  Constitutional: She is oriented to person, place, and time. She appears well-developed.  HENT:  Head: Normocephalic.  Eyes: Conjunctivae and EOM are normal. No scleral icterus.  Neck: Neck supple. No thyromegaly present.  Cardiovascular: Normal rate and regular rhythm.  Exam reveals no gallop and no friction rub.   No murmur heard. Pulmonary/Chest: No stridor. She has no wheezes. She has  no rales. She exhibits no tenderness.  Abdominal: She exhibits no distension. There is no tenderness. There is no rebound.  Musculoskeletal: Normal range of motion. She exhibits no edema.  Lymphadenopathy:    She has no cervical adenopathy.  Neurological: She is oriented to person, place, and time. She exhibits normal muscle tone. Coordination normal.  Skin: No rash noted. No erythema.  Psychiatric:  Patient having flight of ideas. She also states that she has had suicidal thoughts    ED Course  Procedures (including critical care time) Labs Review Labs Reviewed  COMPREHENSIVE METABOLIC PANEL - Abnormal; Notable for the following:    Sodium 134 (*)    Chloride 100 (*)    AST 43 (*)    All other components within normal limits  ACETAMINOPHEN LEVEL - Abnormal; Notable for the following:    Acetaminophen (Tylenol), Serum <10 (*)    All other components within normal limits  URINE RAPID DRUG SCREEN, HOSP PERFORMED - Abnormal; Notable for the following:    Benzodiazepines POSITIVE (*)    All other components within normal limits  ETHANOL  SALICYLATE LEVEL  CBC    Imaging Review No results found. I have personally reviewed and evaluated these images and lab results as part of my medical decision-making.   EKG Interpretation None      MDM   Final diagnoses:  None    Patient was seen by behavioral health and it was recommended for her to get inpatient treatment for her bipolar and suicidal thoughts    Milton Ferguson, MD 04/04/15 2339

## 2015-04-04 NOTE — ED Notes (Signed)
Pt reports Dr. Harrington Challenger sent her here today for suicidal ideation.  Pt reports that she does not have any medication for bipolar disorder.  Pt denies si/hi at this time. Pt reports "well i dont have any suicidal thoughts for now but maybe later."
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
## 2015-04-04 NOTE — Progress Notes (Signed)
Per Dr. De Nurse, patient meets inpatient criteria, on 3/23.  Patient has been referred to the following geriatric facilities: [facility]   [facility] geriatric unit- please refer pt there in am on 3/23. [facility] - left voicemail inquiring about bed status.  At capacity: [facility] per [place], beds will be available on Monday, 3/27.  CSW will continue to seek placement.  Verlon Setting, [facility] Disposition staff 04/04/2015 10:06 PM

## 2015-04-04 NOTE — Patient Instructions (Signed)
Please go to [facility] immediately for psychiatric evaluation for [employer]

## 2015-04-04 NOTE — ED Notes (Signed)
TTS in progress 

## 2015-04-04 NOTE — ED Notes (Signed)
[facility] called to state pt meets inpatient criteria and were looking for placement.

## 2015-04-04 NOTE — ED Notes (Addendum)
Pt states she is bipolar and has not taken her medication x 2 weeks due to cost. Pt states she feels manic and "all the people talking in the waiting room made me feel like I want to do something, like explode". Pt initially denies SI/HI then states "But I honestly don't know, the day isn't over yet". Pt reports thinking about overdosing a few days ago. Pt appears anxious with rapid speech and flight of ideas. Cooperative at this time.

## 2015-04-05 MED ORDER — ACETAMINOPHEN 325 MG PO TABS
650.0000 mg | ORAL_TABLET | ORAL | Status: DC | PRN
  Administered 2015-04-05: 650 mg via ORAL
  Filled 2015-04-05: qty 2

## 2015-04-05 NOTE — ED Notes (Signed)
Pt sitting up at bedside eating breakfast. Denies SI/HI ideations

## 2015-04-05 NOTE — ED Notes (Signed)
Report given to Butch Penny at [facility]. Pt left at this time per [facility] transport

## 2015-04-05 NOTE — ED Notes (Signed)
Pt requesting medication for headache

## 2015-04-05 NOTE — BHH Counselor (Signed)
Accepted to [facility] per [facility]   Accepting physician-Dr. Rosado  Report #: [id number]- can be transported at any time.   Bedelia Person, M.S., LPCA, [place], [facility] Licensed Professional Counselor Associate  Triage Specialist  [facility]  Therapeutic Triage Services Phone: [phone] Fax: [phone]

## 2015-04-05 NOTE — Progress Notes (Signed)
Pt referred to the following facilities with expected openings today: [facility]- per [facility]- per [facility]- per [facility] geriatric unit)- per Warren Lacy, MSW, [place], Disposition  04/05/2015 [phone]

## 2015-04-05 NOTE — ED Notes (Signed)
Pt requested to use phone. Calm and cooperative

## 2015-04-10 ENCOUNTER — Ambulatory Visit (HOSPITAL_COMMUNITY): Payer: Self-pay | Admitting: Psychology

## 2015-04-16 ENCOUNTER — Ambulatory Visit: Payer: Self-pay | Admitting: Physician Assistant

## 2015-04-22 ENCOUNTER — Encounter (HOSPITAL_COMMUNITY): Payer: Self-pay | Admitting: Psychiatry

## 2015-04-22 ENCOUNTER — Ambulatory Visit (INDEPENDENT_AMBULATORY_CARE_PROVIDER_SITE_OTHER): Payer: Self-pay | Admitting: Psychiatry

## 2015-04-22 VITALS — BP 126/74 | HR 86 | Ht 66.0 in | Wt 176.4 lb

## 2015-04-22 DIAGNOSIS — F3110 Bipolar disorder, current episode manic without psychotic features, unspecified: Secondary | ICD-10-CM

## 2015-04-22 MED ORDER — LAMOTRIGINE 100 MG PO TABS: 100.0000 mg | ORAL_TABLET | Freq: Two times a day (BID) | ORAL | Status: DC

## 2015-04-22 MED ORDER — DIAZEPAM 10 MG PO TABS: 10.0000 mg | ORAL_TABLET | Freq: Three times a day (TID) | ORAL | Status: DC
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
## 2015-04-24 ENCOUNTER — Ambulatory Visit: Payer: Self-pay | Admitting: Physician Assistant

## 2015-04-24 ENCOUNTER — Encounter: Payer: Self-pay | Admitting: Physician Assistant

## 2015-04-24 ENCOUNTER — Other Ambulatory Visit: Payer: Self-pay | Admitting: Physician Assistant

## 2015-04-24 VITALS — BP 134/76 | HR 81 | Temp 97.9°F | Ht 66.0 in | Wt 178.2 lb

## 2015-04-24 DIAGNOSIS — K219 Gastro-esophageal reflux disease without esophagitis: Secondary | ICD-10-CM

## 2015-04-24 DIAGNOSIS — F3110 Bipolar disorder, current episode manic without psychotic features, unspecified: Secondary | ICD-10-CM

## 2015-04-24 DIAGNOSIS — F17210 Nicotine dependence, cigarettes, uncomplicated: Secondary | ICD-10-CM

## 2015-04-24 DIAGNOSIS — Z131 Encounter for screening for diabetes mellitus: Secondary | ICD-10-CM

## 2015-04-24 DIAGNOSIS — Z13220 Encounter for screening for lipoid disorders: Secondary | ICD-10-CM

## 2015-04-24 DIAGNOSIS — N951 Menopausal and female climacteric states: Secondary | ICD-10-CM

## 2015-04-24 DIAGNOSIS — Z1239 Encounter for other screening for malignant neoplasm of breast: Secondary | ICD-10-CM

## 2015-04-24 LAB — GLUCOSE, POCT (MANUAL RESULT ENTRY): POC Glucose: 114 mg/dl — AB (ref 70–99)

## 2015-04-24 MED ORDER — OMEPRAZOLE 40 MG PO CPDR: 40.0000 mg | DELAYED_RELEASE_CAPSULE | Freq: Every day | ORAL | Status: DC

## 2015-04-24 NOTE — Patient Instructions (Signed)
mammo Baseline labs Sign up for [facility]- order omeprazole and premarin.  Pt can have Dr Harrington Challenger send lamictal Rx there F/u 1 month

## 2015-04-24 NOTE — Progress Notes (Signed)
BP 134/76 mmHg  Pulse 81  Temp(Src) 97.9 F (36.6 C)  Ht 5\' 6"  (1.676 m)  Wt 178 lb 3.2 oz (80.831 kg)  BMI 28.78 kg/m2  SpO2 98%   Subjective:    Patient ID: Andrea Dickson, female    DOB: [birth year], 65 y.o.   MRN: [id number]  HPI: Andrea Dickson is a 65 y.o. female presenting on 04/24/2015 for New Patient (Initial Visit)   HPI   Pt previously seen at [facility].   Pt was working until september 2016.  She is still seeing Dr Harrington Challenger for bipolar disorder.  Pt had hysterectormy in her 40's .  She says she has been takien off the premarin in the past and her hot flashes were so bad that she had to go back on it.  Discussed the risks/benefits of premarin and she says she wants to continue taking it.  Pt states long time since last mammogram. - her last one was at [facility].  Pt had colonoscopy- done about 5 year ago at [facility]  Relevant past medical, surgical, family and social history reviewed and updated as indicated. Interim medical history since our last visit reviewed. Allergies and medications reviewed and updated.   Current outpatient prescriptions:  .  diazepam (VALIUM) 10 MG tablet, Take 1 tablet (10 mg total) by mouth 3 (three) times daily. (Patient taking differently: Take 10 mg by mouth 3 (three) times daily as needed. ), Disp: 90 tablet, Rfl: 2 .  estrogens, conjugated, (PREMARIN) 1.25 MG tablet, Take 1.25 mg by mouth daily., Disp: , Rfl:  .  lamoTRIgine (LAMICTAL) 100 MG tablet, Take 1 tablet (100 mg total) by mouth 2 (two) times daily., Disp: 60 tablet, Rfl: 2 .  pantoprazole (PROTONIX) 40 MG tablet, Take 40 mg by mouth daily., Disp: , Rfl:      Review of Systems  Constitutional: Positive for fatigue. Negative for fever, chills, diaphoresis, appetite change and unexpected weight change.  HENT: Positive for dental problem. Negative for congestion, drooling, ear pain, facial swelling, hearing loss, mouth sores, sneezing, sore throat, trouble swallowing and  voice change.   Eyes: Negative for pain, discharge, redness, itching and visual disturbance.  Respiratory: Negative for cough, choking, shortness of breath and wheezing.   Cardiovascular: Negative for chest pain, palpitations and leg swelling.  Gastrointestinal: Negative for vomiting, abdominal pain, diarrhea, constipation and blood in stool.  Endocrine: Negative for cold intolerance, heat intolerance and polydipsia.  Genitourinary: Negative for dysuria, hematuria and decreased urine volume.  Musculoskeletal: Negative for back pain, arthralgias and gait problem.  Skin: Negative for rash.  Allergic/Immunologic: Positive for environmental allergies.  Neurological: Positive for headaches. Negative for seizures, syncope and light-headedness.  Hematological: Negative for adenopathy.  Psychiatric/Behavioral: Positive for dysphoric mood and agitation. Negative for suicidal ideas. The patient is nervous/anxious.     Per HPI unless specifically indicated above     Objective:    BP 134/76 mmHg  Pulse 81  Temp(Src) 97.9 F (36.6 C)  Ht 5\' 6"  (1.676 m)  Wt 178 lb 3.2 oz (80.831 kg)  BMI 28.78 kg/m2  SpO2 98%  Wt Readings from Last 3 Encounters:  04/24/15 178 lb 3.2 oz (80.831 kg)  04/22/15 176 lb 6.4 oz (80.015 kg)  04/04/15 174 lb (78.926 kg)    Physical Exam  Constitutional: She is oriented to person, place, and time. She appears well-developed and well-nourished.  HENT:  Head: Normocephalic and atraumatic.  Mouth/Throat: Oropharynx is clear and moist. No oropharyngeal exudate.  Eyes: Conjunctivae and EOM are normal. Pupils are equal, round, and reactive to light.  Neck: Neck supple. No thyromegaly present.  Cardiovascular: Normal rate and regular rhythm.   Pulmonary/Chest: Effort normal and breath sounds normal.  Abdominal: Soft. Bowel sounds are normal. She exhibits no mass. There is no hepatosplenomegaly. There is no tenderness.  Musculoskeletal: She exhibits no edema.   Lymphadenopathy:    She has no cervical adenopathy.  Neurological: She is alert and oriented to person, place, and time. Gait normal.  Skin: Skin is warm and dry.  Psychiatric: She has a normal mood and affect. Her behavior is normal.  Vitals reviewed.   Results for orders placed or performed in visit on 04/24/15  POCT Glucose (CBG)  Result Value Ref Range   POC Glucose 114 (A) 70 - 99 mg/dl      Assessment & Plan:   Encounter Diagnoses  Name Primary?  . Gastroesophageal reflux disease, esophagitis presence not specified Yes  . Menopausal symptoms   . Bipolar I disorder, most recent episode (or current) manic ([place])   . Cigarette nicotine dependence without complication   . Screening for diabetes mellitus   . Screening for breast cancer   . Screening cholesterol level     -order screening mammogram -get Baseline labs -pt Signed up for [facility]- order omeprazole and premarin.  Pt can have Dr Harrington Challenger send lamictal Rx there -requested record from previous colonoscopy -F/u 1 month

## 2015-04-25 LAB — TSH: TSH: 2.5 mIU/L

## 2015-04-25 LAB — LIPID PANEL
CHOL/HDL RATIO: 6.9 ratio — AB (ref <=5.0)
Cholesterol: 208 mg/dL — ABNORMAL HIGH (ref 125–200)
HDL: 30 mg/dL — ABNORMAL LOW (ref >=46)
LDL CALC: 141 mg/dL — AB (ref <130)
TRIGLYCERIDES: 184 mg/dL — AB (ref <150)
VLDL: 37 mg/dL — AB (ref <30)

## 2015-04-26 LAB — HEMOGLOBIN A1C
Hgb A1c MFr Bld: 5.4 % (ref <5.7)
MEAN PLASMA GLUCOSE: 108 mg/dL

## 2015-05-06 ENCOUNTER — Encounter (HOSPITAL_COMMUNITY): Payer: Self-pay | Admitting: Psychology

## 2015-05-06 NOTE — Progress Notes (Signed)
Patient:  Andrea Dickson   DOB: 1950-03-10  MR Number: [id number]  Location: [facility] [address] [place] [address] Dept: [phone]  Start: 2 PM End: 3 PM  Provider/Observer:     Edgardo Roys PSYD  Chief Complaint:      Chief Complaint  Patient presents with  . Agitation  . Anxiety    Reason For Service:     The patient was referred for psychotherapeutic interventions in psychiatric care by Dr. Pleas Koch. The patient and followed through the gr[facility] per office for psychiatric care for some time. The patient reports that she comes in because of her bipolar affective disorder and losing control and developing mania. She reports that she has been stressed by most everything in her life. She reports that she is being very nervous and feeling quite hyper. She reports that this is been going on for the past several months. The patient reports that she can tell when she begins to get hyper and manic. She saw Dr. Adele Schilder in [place] and was taking Limictal and Wellbutrin. The patient reports that she is becoming increasingly manic and feels like she is flying off the hook of promised anything. Her job knows about her psychiatric condition and knows about what is going on in currently in her job is not in jeopardy. She reports today that would change your life tends to set her off. She lives by herself and is not drinking or using any substances. The patient reports for the past several months that she felt this coming on. She reports that she has done a lot of different medications in the past. She reports that she is been counting more and seen "traces" visually and has been experiencing visual lag been very busy.   Interventions Strategy:  Cognitive/behavioral psychotherapeutic interventions  Participation Level:   Active  Participation Quality:  Appropriate      Behavioral  Observation:  Well Groomed, Alert, and Appropriate.   Current Psychosocial Factors: The patient reports that she is still having issues related to manic issues. She is being followed by our clinic for issues related to her underlying bipolar disorder. The patient reports that as she began having more stress that psychiatrist wanted her to be seen back for therapeutic interventions.  Content of Session:   Reviewed current symptoms and work on therapeutic interventions around issues related to her bipolar disorder and difficulties that developed particular during manic episodes.  Current Status:   The patient reports that she is in a hypomanic and/or manic phase of her condition and that this regularly creates issues in her life.  Patient Progress:   The patient is coping well with current medication regimens but continues to have manic episodes.  Target Goals:   Target goals include building better coping skills and strategies to help stabilize her life and reduce the negative impact of her bipolar disorder on her life functioning. Reduce the frequency, duration, and intensity of various mood swings.  Last Reviewed:   02/18/2015  Goals Addressed Today:    Goals today had to do with dealing with building better coping skills and strategies.  Impression/Diagnosis:   The patient is long history of bipolar affective disorder is been treated with mood stabilizing medications for some time. She reports that she has been having a renewed manic episode.   Diagnosis:    Axis I: Bipolar I disorder, most recent episode (or current) manic ([place])

## 2015-05-08 ENCOUNTER — Other Ambulatory Visit: Payer: Self-pay | Admitting: Physician Assistant

## 2015-05-08 MED ORDER — ESTROGENS CONJUGATED 0.9 MG PO TABS: 0.9000 mg | ORAL_TABLET | Freq: Every day | ORAL | Status: DC

## 2015-05-09 ENCOUNTER — Encounter: Payer: Self-pay | Admitting: Physician Assistant

## 2015-05-13 ENCOUNTER — Ambulatory Visit (HOSPITAL_COMMUNITY): Payer: Self-pay | Admitting: Psychiatry

## 2015-05-16 ENCOUNTER — Ambulatory Visit (INDEPENDENT_AMBULATORY_CARE_PROVIDER_SITE_OTHER): Payer: Self-pay | Admitting: Psychiatry

## 2015-05-16 ENCOUNTER — Encounter (HOSPITAL_COMMUNITY): Payer: Self-pay | Admitting: Psychiatry

## 2015-05-16 VITALS — BP 159/88 | HR 84 | Ht 66.0 in | Wt 180.4 lb

## 2015-05-16 DIAGNOSIS — F3110 Bipolar disorder, current episode manic without psychotic features, unspecified: Secondary | ICD-10-CM

## 2015-05-16 MED ORDER — ESCITALOPRAM OXALATE 10 MG PO TABS: 10.0000 mg | ORAL_TABLET | Freq: Every day | ORAL | Status: DC
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
## 2015-05-23 ENCOUNTER — Ambulatory Visit: Payer: Self-pay | Admitting: Physician Assistant

## 2015-05-23 ENCOUNTER — Encounter: Payer: Self-pay | Admitting: Physician Assistant

## 2015-05-23 VITALS — BP 126/68 | HR 88 | Temp 97.9°F | Ht 66.0 in | Wt 179.0 lb

## 2015-05-23 DIAGNOSIS — E785 Hyperlipidemia, unspecified: Secondary | ICD-10-CM

## 2015-05-23 DIAGNOSIS — N951 Menopausal and female climacteric states: Secondary | ICD-10-CM

## 2015-05-23 DIAGNOSIS — F17210 Nicotine dependence, cigarettes, uncomplicated: Secondary | ICD-10-CM

## 2015-05-23 DIAGNOSIS — K219 Gastro-esophageal reflux disease without esophagitis: Secondary | ICD-10-CM

## 2015-05-23 DIAGNOSIS — Z1239 Encounter for other screening for malignant neoplasm of breast: Secondary | ICD-10-CM

## 2015-05-23 MED ORDER — ATORVASTATIN CALCIUM 20 MG PO TABS: 20.0000 mg | ORAL_TABLET | Freq: Every day | ORAL | Status: DC

## 2015-05-23 NOTE — Progress Notes (Signed)
BP 126/68 mmHg  Pulse 88  Temp(Src) 97.9 F (36.6 C)  Ht 5\' 6"  (1.676 m)  Wt 179 lb (81.194 kg)  BMI 28.91 kg/m2  SpO2 98%   Subjective:    Patient ID: Andrea Dickson, female    DOB: [birth year], 65 y.o.   MRN: [id number]  HPI: Andrea Dickson is a 65 y.o. female presenting on 05/23/2015 for Gastroesophageal Reflux   HPI   Pt sees dr Harrington Challenger for psych.  Had appointment there last week  Other than her [place] issues, pt says she is doing well  Relevant past medical, surgical, family and social history reviewed and updated as indicated. Interim medical history since our last visit reviewed. Allergies and medications reviewed and updated.  CURRENT MEDS: Diazepam escitalopram Premarin lamictal omeprazole  Review of Systems  Constitutional: Positive for fatigue and unexpected weight change. Negative for fever, chills, diaphoresis and appetite change.  HENT: Negative for congestion, dental problem, drooling, ear pain, facial swelling, hearing loss, mouth sores, sneezing, sore throat, trouble swallowing and voice change.   Eyes: Negative for pain, discharge, redness, itching and visual disturbance.  Respiratory: Negative for cough, choking, shortness of breath and wheezing.   Cardiovascular: Positive for leg swelling. Negative for chest pain and palpitations.  Gastrointestinal: Negative for vomiting, abdominal pain, diarrhea, constipation and blood in stool.  Endocrine: Negative for cold intolerance, heat intolerance and polydipsia.  Genitourinary: Negative for dysuria, hematuria and decreased urine volume.  Musculoskeletal: Negative for back pain, arthralgias and gait problem.  Skin: Negative for rash.  Allergic/Immunologic: Positive for environmental allergies.  Neurological: Positive for headaches. Negative for seizures, syncope and light-headedness.  Hematological: Negative for adenopathy.    Per HPI unless specifically indicated above     Objective:    BP 126/68  mmHg  Pulse 88  Temp(Src) 97.9 F (36.6 C)  Ht 5\' 6"  (1.676 m)  Wt 179 lb (81.194 kg)  BMI 28.91 kg/m2  SpO2 98%  Wt Readings from Last 3 Encounters:  05/23/15 179 lb (81.194 kg)  05/16/15 180 lb 6.4 oz (81.829 kg)  04/24/15 178 lb 3.2 oz (80.831 kg)    Physical Exam  Constitutional: She is oriented to person, place, and time. She appears well-developed and well-nourished.  HENT:  Head: Normocephalic and atraumatic.  Neck: Neck supple.  Cardiovascular: Normal rate and regular rhythm.   Pulmonary/Chest: Effort normal and breath sounds normal.  Abdominal: Soft. Bowel sounds are normal. She exhibits no mass. There is no hepatosplenomegaly. There is no tenderness.  Musculoskeletal: She exhibits no edema.  Lymphadenopathy:    She has no cervical adenopathy.  Neurological: She is alert and oriented to person, place, and time.  Skin: Skin is warm and dry.  Psychiatric: She has a normal mood and affect. Her behavior is normal.  Vitals reviewed.   Results for orders placed or performed in visit on 04/24/15  Lipid Profile  Result Value Ref Range   Cholesterol 208 (H) 125 - 200 mg/dL   Triglycerides 184 (H) <150 mg/dL   HDL 30 (L) >=46 mg/dL   Total CHOL/HDL Ratio 6.9 (H) <=5.0 Ratio   VLDL 37 (H) <30 mg/dL   LDL Cholesterol 141 (H) <130 mg/dL  TSH  Result Value Ref Range   TSH 2.50 mIU/L  HgB A1c  Result Value Ref Range   Hgb A1c MFr Bld 5.4 <5.7 %   Mean Plasma Glucose 108 mg/dL  POCT Glucose (CBG)  Result Value Ref Range   POC Glucose 114 (A)  70 - 99 mg/dl      Assessment & Plan:   Encounter Diagnoses  Name Primary?  . Hyperlipidemia Yes  . Gastroesophageal reflux disease, esophagitis presence not specified   . Menopausal symptoms   . Cigarette nicotine dependence without complication   . Screening for breast cancer     -Reviewed labs with pt -rx atorvastatin and counseled on lowfat diet -order mammogra -counseled on smoking cessation.  Pt states she does not  want to quit -F/u 3 month. RTO sooner prn

## 2015-05-23 NOTE — Patient Instructions (Signed)

## 2015-05-24 DIAGNOSIS — E785 Hyperlipidemia, unspecified: Secondary | ICD-10-CM | POA: Insufficient documentation

## 2015-05-31 ENCOUNTER — Telehealth (HOSPITAL_COMMUNITY): Payer: Self-pay | Admitting: *Deleted

## 2015-05-31 NOTE — Telephone Encounter (Signed)
[email or web address]

## 2015-05-31 NOTE — Telephone Encounter (Signed)
Spoke with pt and she stated she will call [facility] to get transfer of her Lamictal to [facility]. Per pt, she will call later if [facility] don't transfer

## 2015-05-31 NOTE — Telephone Encounter (Signed)
phone call from patient, she need Lamictal to called in [facility].

## 2015-06-06 ENCOUNTER — Ambulatory Visit (HOSPITAL_COMMUNITY)
Admission: RE | Admit: 2015-06-06 | Discharge: 2015-06-06 | Disposition: A | Discharge status: Home / Self Care | Payer: Self-pay | Source: Ambulatory Visit | Attending: Physician Assistant | Admitting: Physician Assistant

## 2015-06-06 ENCOUNTER — Other Ambulatory Visit (HOSPITAL_COMMUNITY): Payer: Self-pay | Admitting: Physician Assistant

## 2015-06-06 ENCOUNTER — Encounter (HOSPITAL_COMMUNITY): Payer: Self-pay

## 2015-06-06 DIAGNOSIS — Z1231 Encounter for screening mammogram for malignant neoplasm of breast: Secondary | ICD-10-CM

## 2015-06-19 ENCOUNTER — Encounter (HOSPITAL_COMMUNITY): Payer: Self-pay | Admitting: Psychology

## 2015-06-19 NOTE — Progress Notes (Signed)
Patient:  Andrea Dickson   DOB: 24-Oct-1950  MR Number: [id number]  Location: [facility] [address] [place] [address] Dept: [phone]  Start: 2 PM End: 3 PM  Provider/Observer:     Edgardo Roys PSYD  Chief Complaint:      Chief Complaint  Patient presents with  . Depression  . Anxiety  . Agitation  . Stress    Reason For Service:     The patient was referred for psychotherapeutic interventions in psychiatric care by Dr. Pleas Koch. The patient and followed through the grays per office for psychiatric care for some time. The patient reports that she comes in because of her bipolar affective disorder and losing control and developing mania. She reports that she has been stressed by most everything in her life. She reports that she is being very nervous and feeling quite hyper. She reports that this is been going on for the past several months. The patient reports that she can tell when she begins to get hyper and manic. She saw Dr. Adele Schilder in [place] and was taking Limictal and Wellbutrin. The patient reports that she is becoming increasingly manic and feels like she is flying off the hook of promised anything. Her job knows about her psychiatric condition and knows about what is going on in currently in her job is not in jeopardy. She reports today that would change your life tends to set her off. She lives by herself and is not drinking or using any substances. The patient reports for the past several months that she felt this coming on. She reports that she has done a lot of different medications in the past. She reports that she is been counting more and seen "traces" visually and has been experiencing visual lag been very busy.   Interventions Strategy:  Cognitive/behavioral psychotherapeutic interventions  Participation Level:   Active  Participation Quality:  Appropriate       Behavioral Observation:  Well Groomed, Alert, and Appropriate.   Current Psychosocial Factors: The patient reports that she is still having issues related to manic issues. She is being followed by our clinic for issues related to her underlying bipolar disorder. The patient reports that as she began having more stress that psychiatrist wanted her to be seen back for therapeutic interventions.  Content of Session:   Reviewed current symptoms and work on therapeutic interventions around issues related to her bipolar disorder and difficulties that developed particular during manic episodes.  Current Status:   The patient reports that she is in a hypomanic and/or manic phase of her condition and that this regularly creates issues in her life.  Patient Progress:   The patient is coping well with current medication regimens but continues to have manic episodes.  Target Goals:   Target goals include building better coping skills and strategies to help stabilize her life and reduce the negative impact of her bipolar disorder on her life functioning. Reduce the frequency, duration, and intensity of various mood swings.  Last Reviewed:   03/12/2015  Goals Addressed Today:    Goals today had to do with dealing with building better coping skills and strategies.  Impression/Diagnosis:   The patient is long history of bipolar affective disorder is been treated with mood stabilizing medications for some time. She reports that she has been having a renewed manic episode.   Diagnosis:    Axis I: Bipolar I disorder, most recent episode (or  current) manic ([place])

## 2015-06-26 ENCOUNTER — Ambulatory Visit (INDEPENDENT_AMBULATORY_CARE_PROVIDER_SITE_OTHER): Payer: Self-pay | Admitting: Psychiatry

## 2015-06-26 ENCOUNTER — Encounter (HOSPITAL_COMMUNITY): Payer: Self-pay | Admitting: Psychiatry

## 2015-06-26 VITALS — BP 123/66 | HR 78 | Ht 66.0 in | Wt 176.4 lb

## 2015-06-26 DIAGNOSIS — F3110 Bipolar disorder, current episode manic without psychotic features, unspecified: Secondary | ICD-10-CM

## 2015-06-26 MED ORDER — LAMOTRIGINE 100 MG PO TABS: 100.0000 mg | ORAL_TABLET | Freq: Three times a day (TID) | ORAL | Status: DC

## 2015-06-26 MED ORDER — ALPRAZOLAM 1 MG PO TABS: 1.0000 mg | ORAL_TABLET | Freq: Three times a day (TID) | ORAL | Status: DC | PRN
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
## 2015-07-10 ENCOUNTER — Telehealth (HOSPITAL_COMMUNITY): Payer: Self-pay | Admitting: *Deleted

## 2015-07-10 NOTE — Telephone Encounter (Signed)
PEER TO PEER REVIEW transferred call to Dr. Harrington Challenger.

## 2015-07-15 ENCOUNTER — Telehealth (HOSPITAL_COMMUNITY): Payer: Self-pay | Admitting: *Deleted

## 2015-07-15 NOTE — Telephone Encounter (Signed)
Pt called stating she is not sure which medication is giving her problems but she thinks it's her Xanax. Per pt, she takes 0.5 tablets of her Xanax in AM and sometimes takes an extra 0.5 during the day and use a whole tablet at bedtime to help her sleep. Per pt, she is sleepy and groggy all morning and day and sway side to side. Per pt, this has been happening for about a week. Pt would like to know what to do. Pt is aware that provider is out of office today 07-15-15 and office will call her back on 07-17-15.

## 2015-07-17 ENCOUNTER — Telehealth (HOSPITAL_COMMUNITY): Payer: Self-pay | Admitting: *Deleted

## 2015-07-17 NOTE — Telephone Encounter (Signed)
Pt called and lm on office voicemail on July 4th at 3:30pm stating office was to talk to provider and all her back on July 4th. Office called pt on July 5th at 8:03am and lmtcb and number provided.

## 2015-07-17 NOTE — Telephone Encounter (Signed)
Pt called office back. Office informed pt with what provider stated and pt verbalized understanding.

## 2015-07-17 NOTE — Telephone Encounter (Signed)
Called pt and lmtcb and office number provided.  

## 2015-07-17 NOTE — Telephone Encounter (Signed)
Tell her to take it only at bedtime

## 2015-08-05 ENCOUNTER — Ambulatory Visit (INDEPENDENT_AMBULATORY_CARE_PROVIDER_SITE_OTHER): Payer: Self-pay | Admitting: Psychiatry

## 2015-08-05 ENCOUNTER — Telehealth (HOSPITAL_COMMUNITY): Payer: Self-pay | Admitting: *Deleted

## 2015-08-05 ENCOUNTER — Encounter (HOSPITAL_COMMUNITY): Payer: Self-pay | Admitting: Psychiatry

## 2015-08-05 VITALS — BP 141/74 | HR 93 | Ht 66.0 in | Wt 179.4 lb

## 2015-08-05 DIAGNOSIS — F3110 Bipolar disorder, current episode manic without psychotic features, unspecified: Secondary | ICD-10-CM

## 2015-08-05 MED ORDER — LAMOTRIGINE 100 MG PO TABS: 100.0000 mg | ORAL_TABLET | Freq: Three times a day (TID) | ORAL | 2 refills | Status: DC

## 2015-08-05 MED ORDER — ALPRAZOLAM 1 MG PO TABS: 1.0000 mg | ORAL_TABLET | Freq: Three times a day (TID) | ORAL | 2 refills | Status: DC | PRN

## 2015-08-05 NOTE — Telephone Encounter (Signed)
Pt called wanting to see if Dr. Harrington Challenger wanted to keep her on Lamictal while she's on Latuda or if she needed to stop taking Lamictal. Informed pt that office will have to call her back. Called pt and informed her of what provider wrote in her chart. Pt verbalized understanding.
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
## 2015-08-15 ENCOUNTER — Telehealth (HOSPITAL_COMMUNITY): Payer: Self-pay | Admitting: *Deleted

## 2015-08-15 NOTE — Telephone Encounter (Signed)
voice message from patient, said the Anette Guarneri is making her sick as a dog.  She wants another prescription written for Latuda.

## 2015-08-19 ENCOUNTER — Other Ambulatory Visit (HOSPITAL_COMMUNITY): Payer: Self-pay | Admitting: Psychiatry

## 2015-08-19 ENCOUNTER — Telehealth (HOSPITAL_COMMUNITY): Payer: Self-pay | Admitting: *Deleted

## 2015-08-19 ENCOUNTER — Other Ambulatory Visit: Payer: Self-pay

## 2015-08-19 DIAGNOSIS — E785 Hyperlipidemia, unspecified: Secondary | ICD-10-CM

## 2015-08-19 MED ORDER — LURASIDONE HCL 20 MG PO TABS: 20.0000 mg | ORAL_TABLET | Freq: Every day | ORAL | 1 refills | Status: DC

## 2015-08-19 NOTE — Telephone Encounter (Signed)
Called pt due to previous message. lmtcb and number provided. Per previous call, pt stated that her Anette Guarneri was making her sick as a dog and needed another [place] script to be written for her. Needed more information but lmtcb on number in chart. Will call back at another time. Pt number is [phone]

## 2015-08-19 NOTE — Telephone Encounter (Signed)
Per Dr. Harrington Challenger to send Latuda 20 mg QD with 2 refills. Called pt to verify pharmacy and she stated [facility] is a mail order pharmacy. Informed pt with new directions for Latuda and pt verbalized understanding. Will send 90 tabs and 1 refills due to pharmacy being a mail order.

## 2015-08-19 NOTE — Telephone Encounter (Signed)
Please call in latuda 20 mg # 30 one daily with supper, 2 refills

## 2015-08-19 NOTE — Telephone Encounter (Signed)
Pt called back. Per pt, when she started on Latuda 20 mg she was fine. Per pt, when she moved to [place] 40 on the 3rd or 4th day, she started having major stomach cramps. Per pt she is taking with food. Per pt, the 7 days that she was on 20 mg Latuda, she was fine. Per pt, she don't know if Dr. Harrington Challenger would like to keep her on this or not. Pt number is [phone]

## 2015-08-19 NOTE — Telephone Encounter (Signed)
Pt is aware with new instructions from provider

## 2015-08-19 NOTE — Telephone Encounter (Signed)
voice message from patient left on 08/16/15 at 12:26 p.m.  stated that the Latuda 40 mg. is making her sick.

## 2015-08-19 NOTE — Progress Notes (Unsigned)
latuda

## 2015-08-19 NOTE — Telephone Encounter (Signed)
Sent 90 tabs 1 refill to pt pharmacy for Latuda 20 mg QD. Pt pharmacy is a mail order and normally they require 3 months supply.

## 2015-08-23 LAB — COMPLETE METABOLIC PANEL WITH GFR
ALT: 34 U/L — ABNORMAL HIGH (ref 6–29)
AST: 66 U/L — ABNORMAL HIGH (ref 10–35)
Albumin: 3.8 g/dL (ref 3.6–5.1)
Alkaline Phosphatase: 204 U/L — ABNORMAL HIGH (ref 33–130)
BUN: 13 mg/dL (ref 7–25)
CHLORIDE: 104 mmol/L (ref 98–110)
CO2: 22 mmol/L (ref 20–31)
Calcium: 8.8 mg/dL (ref 8.6–10.4)
Creat: 0.71 mg/dL (ref 0.50–0.99)
Glucose, Bld: 178 mg/dL — ABNORMAL HIGH (ref 65–99)
Potassium: 4.3 mmol/L (ref 3.5–5.3)
Sodium: 140 mmol/L (ref 135–146)
Total Bilirubin: 0.8 mg/dL (ref 0.2–1.2)
Total Protein: 6.1 g/dL (ref 6.1–8.1)

## 2015-08-23 LAB — LIPID PANEL
Cholesterol: 148 mg/dL (ref 125–200)
HDL: 19 mg/dL — AB (ref >=46)
LDL CALC: 89 mg/dL (ref <130)
TRIGLYCERIDES: 202 mg/dL — AB (ref <150)
Total CHOL/HDL Ratio: 7.8 Ratio — ABNORMAL HIGH (ref <=5.0)
VLDL: 40 mg/dL — AB (ref <30)

## 2015-08-28 ENCOUNTER — Ambulatory Visit: Payer: Self-pay | Admitting: Physician Assistant

## 2015-09-03 ENCOUNTER — Ambulatory Visit: Payer: Self-pay | Admitting: Physician Assistant

## 2015-09-03 ENCOUNTER — Encounter: Payer: Self-pay | Admitting: Physician Assistant

## 2015-09-03 VITALS — BP 132/64 | HR 76 | Temp 97.7°F | Ht 66.0 in | Wt 178.8 lb

## 2015-09-03 DIAGNOSIS — E785 Hyperlipidemia, unspecified: Secondary | ICD-10-CM

## 2015-09-03 DIAGNOSIS — N951 Menopausal and female climacteric states: Secondary | ICD-10-CM

## 2015-09-03 DIAGNOSIS — K219 Gastro-esophageal reflux disease without esophagitis: Secondary | ICD-10-CM

## 2015-09-03 DIAGNOSIS — F17210 Nicotine dependence, cigarettes, uncomplicated: Secondary | ICD-10-CM

## 2015-09-03 NOTE — Progress Notes (Signed)
BP 132/64 (BP Location: Left Arm, Patient Position: Sitting, Cuff Size: Normal)   Pulse 76   Temp 97.7 F (36.5 C) (Other (Comment))   Ht 5\' 6"  (1.676 m)   Wt 178 lb 12.8 oz (81.1 kg)   SpO2 95%   BMI 28.86 kg/m    Subjective:    Patient ID: Andrea Dickson, female    DOB: 1950/04/18, 65 y.o.   MRN: [id number]  HPI: DENEE LUNDVALL is a 65 y.o. female presenting on 09/03/2015 for Hyperlipidemia   HPI   Pt is doing well.  She states her only problems is her MH.  She is continuing with Dr Harrington Challenger for [facility] issues.   Relevant past medical, surgical, family and social history reviewed and updated as indicated. Interim medical history since our last visit reviewed. Allergies and medications reviewed and updated.   Current Outpatient Prescriptions:  .  ALPRAZolam (XANAX) 1 MG tablet, Take 1 tablet (1 mg total) by mouth 3 (three) times daily as needed for anxiety., Disp: 90 tablet, Rfl: 2 .  atorvastatin (LIPITOR) 20 MG tablet, Take 1 tablet (20 mg total) by mouth at bedtime., Disp: 90 tablet, Rfl: 1 .  estrogens, conjugated, (PREMARIN) 0.9 MG tablet, Take 1 tablet (0.9 mg total) by mouth daily. Take daily for 21 days then do not take for 7 days., Disp: 70 tablet, Rfl: 1 .  lamoTRIgine (LAMICTAL) 100 MG tablet, Take 1 tablet (100 mg total) by mouth 3 (three) times daily., Disp: 270 tablet, Rfl: 2 .  omeprazole (PRILOSEC) 40 MG capsule, Take 1 capsule (40 mg total) by mouth daily., Disp: 90 capsule, Rfl: 1 .  lurasidone (LATUDA) 20 MG TABS tablet, Take 1 tablet (20 mg total) by mouth daily. (Patient not taking: Reported on 09/03/2015), Disp: 90 tablet, Rfl: 1  Review of Systems  Constitutional: Positive for diaphoresis and fatigue. Negative for appetite change, chills, fever and unexpected weight change.  HENT: Positive for dental problem. Negative for congestion, drooling, ear pain, facial swelling, hearing loss, mouth sores, sneezing, sore throat, trouble swallowing and voice change.    Eyes: Negative for pain, discharge, redness, itching and visual disturbance.  Respiratory: Negative for cough, choking, shortness of breath and wheezing.   Cardiovascular: Negative for chest pain, palpitations and leg swelling.  Gastrointestinal: Positive for diarrhea. Negative for abdominal pain, blood in stool, constipation and vomiting.  Endocrine: Negative for cold intolerance, heat intolerance and polydipsia.  Genitourinary: Negative for decreased urine volume, dysuria and hematuria.  Musculoskeletal: Negative for arthralgias, back pain and gait problem.  Skin: Negative for rash.  Allergic/Immunologic: Positive for environmental allergies.  Neurological: Positive for headaches. Negative for seizures, syncope and light-headedness.  Hematological: Negative for adenopathy.  Psychiatric/Behavioral: Positive for agitation and dysphoric mood. Negative for suicidal ideas. The patient is nervous/anxious.      Current Outpatient Prescriptions:  .  ALPRAZolam (XANAX) 1 MG tablet, Take 1 tablet (1 mg total) by mouth 3 (three) times daily as needed for anxiety., Disp: 90 tablet, Rfl: 2 .  atorvastatin (LIPITOR) 20 MG tablet, Take 1 tablet (20 mg total) by mouth at bedtime., Disp: 90 tablet, Rfl: 1 .  estrogens, conjugated, (PREMARIN) 0.9 MG tablet, Take 1 tablet (0.9 mg total) by mouth daily. Take daily for 21 days then do not take for 7 days., Disp: 70 tablet, Rfl: 1 .  lamoTRIgine (LAMICTAL) 100 MG tablet, Take 1 tablet (100 mg total) by mouth 3 (three) times daily., Disp: 270 tablet, Rfl: 2 .  omeprazole (  PRILOSEC) 40 MG capsule, Take 1 capsule (40 mg total) by mouth daily., Disp: 90 capsule, Rfl: 1 .  lurasidone (LATUDA) 20 MG TABS tablet, Take 1 tablet (20 mg total) by mouth daily. (Patient not taking: Reported on 09/03/2015), Disp: 90 tablet, Rfl: 1   Per HPI unless specifically indicated above     Objective:    BP 132/64 (BP Location: Left Arm, Patient Position: Sitting, Cuff Size:  Normal)   Pulse 76   Temp 97.7 F (36.5 C) (Other (Comment))   Ht 5\' 6"  (1.676 m)   Wt 178 lb 12.8 oz (81.1 kg)   SpO2 95%   BMI 28.86 kg/m   Wt Readings from Last 3 Encounters:  09/03/15 178 lb 12.8 oz (81.1 kg)  05/23/15 179 lb (81.2 kg)  04/24/15 178 lb 3.2 oz (80.8 kg)    Physical Exam  Constitutional: She is oriented to person, place, and time. She appears well-developed and well-nourished.  HENT:  Head: Normocephalic and atraumatic.  Neck: Neck supple.  Cardiovascular: Normal rate and regular rhythm.   Pulmonary/Chest: Effort normal and breath sounds normal.  Abdominal: Soft. Bowel sounds are normal. She exhibits no mass. There is no hepatosplenomegaly. There is no tenderness.  Musculoskeletal: She exhibits no edema.  Lymphadenopathy:    She has no cervical adenopathy.  Neurological: She is alert and oriented to person, place, and time.  Skin: Skin is warm and dry.  Psychiatric: She has a normal mood and affect. Her behavior is normal.  Vitals reviewed.   Results for orders placed or performed in visit on 08/19/15  COMPLETE METABOLIC PANEL WITH GFR  Result Value Ref Range   Sodium 140 135 - 146 mmol/L   Potassium 4.3 3.5 - 5.3 mmol/L   Chloride 104 98 - 110 mmol/L   CO2 22 20 - 31 mmol/L   Glucose, Bld 178 (H) 65 - 99 mg/dL   BUN 13 7 - 25 mg/dL   Creat 0.71 0.50 - 0.99 mg/dL   Total Bilirubin 0.8 0.2 - 1.2 mg/dL   Alkaline Phosphatase 204 (H) 33 - 130 U/L   AST 66 (H) 10 - 35 U/L   ALT 34 (H) 6 - 29 U/L   Total Protein 6.1 6.1 - 8.1 g/dL   Albumin 3.8 3.6 - 5.1 g/dL   Calcium 8.8 8.6 - 10.4 mg/dL   GFR, Est African American >89 >=60 mL/min   GFR, Est Non African American >89 >=60 mL/min  Lipid Profile  Result Value Ref Range   Cholesterol 148 125 - 200 mg/dL   Triglycerides 202 (H) <150 mg/dL   HDL 19 (L) >=46 mg/dL   Total CHOL/HDL Ratio 7.8 (H) <=5.0 Ratio   VLDL 40 (H) <30 mg/dL   LDL Cholesterol 89 <130 mg/dL      Assessment & Plan:   Encounter  Diagnoses  Name Primary?  . Hyperlipidemia Yes  . Cigarette nicotine dependence without complication   . Gastroesophageal reflux disease, esophagitis presence not specified   . Menopausal symptoms     -reviewed labs with pt -continue current medications -continue with Dr Harrington Challenger for [facility] issues -discussed with pt that she will need new PCP next year when she goes on medicare -f/u 3 months.  RTO sooner prn

## 2015-09-12 ENCOUNTER — Ambulatory Visit (INDEPENDENT_AMBULATORY_CARE_PROVIDER_SITE_OTHER): Payer: Self-pay | Admitting: Psychiatry

## 2015-09-12 ENCOUNTER — Telehealth (HOSPITAL_COMMUNITY): Payer: Self-pay | Admitting: *Deleted

## 2015-09-12 ENCOUNTER — Encounter (HOSPITAL_COMMUNITY): Payer: Self-pay | Admitting: Psychiatry

## 2015-09-12 VITALS — BP 142/86 | HR 95 | Ht 66.0 in | Wt 174.2 lb

## 2015-09-12 DIAGNOSIS — F3110 Bipolar disorder, current episode manic without psychotic features, unspecified: Secondary | ICD-10-CM

## 2015-09-12 MED ORDER — ALPRAZOLAM 1 MG PO TABS: 1.0000 mg | ORAL_TABLET | Freq: Three times a day (TID) | ORAL | 2 refills | Status: DC | PRN

## 2015-09-12 MED ORDER — DIVALPROEX SODIUM ER 250 MG PO TB24: 250.0000 mg | ORAL_TABLET | Freq: Every day | ORAL | 2 refills | Status: DC

## 2015-09-12 NOTE — Patient Instructions (Signed)
Start depakote and taper off lamictal Depakote level in 2 weeks
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
## 2015-09-12 NOTE — Telephone Encounter (Signed)
Pt pharmacy [facility]) called for medication verification. Informed Luke per pt chart and providers note, pt is taper off her Lamictal and to start Depakote ER. Luke verbalized understanding.

## 2015-10-07 ENCOUNTER — Telehealth (HOSPITAL_COMMUNITY): Payer: Self-pay | Admitting: *Deleted

## 2015-10-07 NOTE — Telephone Encounter (Signed)
Usually drug rashes are on the trunk but if she can come in this week I will look at it

## 2015-10-07 NOTE — Telephone Encounter (Signed)
Pt called stating she' been on Depakote for the past 2 weeks and I noticing a rash on her chin. Pt would like to know what to do. Informed pt provider is not in office but message will be sent to her. Informed pt if rash become worse to please go to urgent care. Pt agreed and verbalized understanding.

## 2015-10-08 ENCOUNTER — Ambulatory Visit: Payer: Self-pay | Admitting: Physician Assistant

## 2015-10-08 ENCOUNTER — Encounter: Payer: Self-pay | Admitting: Physician Assistant

## 2015-10-08 VITALS — BP 134/66 | HR 76 | Temp 98.1°F | Wt 179.6 lb

## 2015-10-08 DIAGNOSIS — N3090 Cystitis, unspecified without hematuria: Secondary | ICD-10-CM

## 2015-10-08 DIAGNOSIS — R300 Dysuria: Secondary | ICD-10-CM

## 2015-10-08 LAB — POCT URINALYSIS DIPSTICK
Glucose, UA: NEGATIVE
Ketones, UA: 15
NITRITE UA: POSITIVE
PH UA: 6
PROTEIN UA: 30
Spec Grav, UA: 1.015
UROBILINOGEN UA: 1

## 2015-10-08 MED ORDER — SULFAMETHOXAZOLE-TRIMETHOPRIM 800-160 MG PO TABS: 1.0000 | ORAL_TABLET | Freq: Two times a day (BID) | ORAL | 0 refills | Status: AC

## 2015-10-08 NOTE — Progress Notes (Signed)
   BP 134/66 (BP Location: Left Arm, Patient Position: Sitting, Cuff Size: Normal)   Pulse 76   Temp 98.1 F (36.7 C)   Wt 179 lb 9.6 oz (81.5 kg)   SpO2 97%   BMI 28.99 kg/m    Subjective:    Patient ID: Andrea Dickson, female    DOB: 05-07-1950, 65 y.o.   MRN: [id number]  HPI: NIMRIT PANTEL is a 65 y.o. female presenting on 10/08/2015 for Dysphagia (burning and pain when urinating. low back pain)   HPI Chief Complaint  Patient presents with  . Dysphagia    burning and pain when urinating. low back pain    Symptoms x 4 d. No fever, n/v.  Relevant past medical, surgical, family and social history reviewed and updated as indicated. Interim medical history since our last visit reviewed. Allergies and medications reviewed and updated.  CURRENT MEDS: Xanax lipitor depakote Premarin prilosec  Review of Systems  Constitutional: Negative for appetite change, chills, diaphoresis, fatigue, fever and unexpected weight change.  HENT: Negative for congestion, drooling, ear pain, facial swelling, hearing loss, mouth sores, sneezing, sore throat, trouble swallowing and voice change.   Eyes: Negative for pain, discharge, redness, itching and visual disturbance.  Respiratory: Negative for cough, choking, shortness of breath and wheezing.   Cardiovascular: Negative for chest pain, palpitations and leg swelling.  Gastrointestinal: Negative for abdominal pain, blood in stool, constipation, diarrhea and vomiting.  Endocrine: Negative for cold intolerance, heat intolerance and polydipsia.  Genitourinary: Positive for dysuria. Negative for decreased urine volume and hematuria.  Musculoskeletal: Negative for arthralgias, back pain and gait problem.  Skin: Negative for rash.  Allergic/Immunologic: Negative for environmental allergies.  Neurological: Negative for seizures, syncope, light-headedness and headaches.  Hematological: Negative for adenopathy.  Psychiatric/Behavioral:  Negative for agitation, dysphoric mood and suicidal ideas. The patient is not nervous/anxious.     Per HPI unless specifically indicated above     Objective:    BP 134/66 (BP Location: Left Arm, Patient Position: Sitting, Cuff Size: Normal)   Pulse 76   Temp 98.1 F (36.7 C)   Wt 179 lb 9.6 oz (81.5 kg)   SpO2 97%   BMI 28.99 kg/m   Wt Readings from Last 3 Encounters:  10/08/15 179 lb 9.6 oz (81.5 kg)  09/03/15 178 lb 12.8 oz (81.1 kg)  05/23/15 179 lb (81.2 kg)    Physical Exam  Constitutional: She is oriented to person, place, and time. She appears well-developed and well-nourished.  HENT:  Head: Normocephalic and atraumatic.  Neck: Neck supple.  Cardiovascular: Normal rate and regular rhythm.   Pulmonary/Chest: Effort normal and breath sounds normal.  Abdominal: Soft. Bowel sounds are normal. She exhibits no mass. There is no hepatosplenomegaly. There is no tenderness.  Musculoskeletal: She exhibits no edema.  Lymphadenopathy:    She has no cervical adenopathy.  Neurological: She is alert and oriented to person, place, and time.  Skin: Skin is warm and dry.  Psychiatric: She has a normal mood and affect. Her behavior is normal.  Vitals reviewed.  UA + nit     Assessment & Plan:   Encounter Diagnoses  Name Primary?  . Cystitis Yes  . Dysuria     -rx septra ds. Fluids -f/u as scheduled.  RTO sooner if symptoms worsen or persist

## 2015-10-08 NOTE — Telephone Encounter (Signed)
Pt called back and office informed her with what Dr. Harrington Challenger stated. Per pt it's not itching like the first time but it's still there and she'll just wait until her f/u appt to discuss rash.

## 2015-10-08 NOTE — Telephone Encounter (Signed)
[email or web address]

## 2015-10-14 ENCOUNTER — Other Ambulatory Visit: Payer: Self-pay | Admitting: Physician Assistant

## 2015-10-15 LAB — VALPROIC ACID LEVEL: VALPROIC ACID LVL: 83 ug/mL (ref 50.0–100.0)

## 2015-10-24 ENCOUNTER — Ambulatory Visit (INDEPENDENT_AMBULATORY_CARE_PROVIDER_SITE_OTHER): Payer: Self-pay | Admitting: Psychiatry

## 2015-10-24 ENCOUNTER — Encounter (HOSPITAL_COMMUNITY): Payer: Self-pay | Admitting: Psychiatry

## 2015-10-24 VITALS — BP 149/81 | HR 74 | Ht 66.0 in | Wt 175.8 lb

## 2015-10-24 DIAGNOSIS — F17210 Nicotine dependence, cigarettes, uncomplicated: Secondary | ICD-10-CM

## 2015-10-24 DIAGNOSIS — F3110 Bipolar disorder, current episode manic without psychotic features, unspecified: Secondary | ICD-10-CM

## 2015-10-24 DIAGNOSIS — Z79899 Other long term (current) drug therapy: Secondary | ICD-10-CM

## 2015-10-24 DIAGNOSIS — Z818 Family history of other mental and behavioral disorders: Secondary | ICD-10-CM

## 2015-10-24 DIAGNOSIS — Z8249 Family history of ischemic heart disease and other diseases of the circulatory system: Secondary | ICD-10-CM

## 2015-10-24 DIAGNOSIS — Z833 Family history of diabetes mellitus: Secondary | ICD-10-CM

## 2015-10-24 MED ORDER — ALPRAZOLAM 1 MG PO TABS: 1.0000 mg | ORAL_TABLET | Freq: Three times a day (TID) | ORAL | 2 refills | Status: DC | PRN

## 2015-10-24 MED ORDER — ESCITALOPRAM OXALATE 20 MG PO TABS: 20.0000 mg | ORAL_TABLET | Freq: Every day | ORAL | 2 refills | Status: DC
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
## 2015-10-24 NOTE — Patient Instructions (Signed)
Cut down depakote to two at night

## 2015-11-11 ENCOUNTER — Telehealth (HOSPITAL_COMMUNITY): Payer: Self-pay | Admitting: *Deleted

## 2015-11-11 NOTE — Telephone Encounter (Signed)
voice message from patient said Lexapro not covered. Please call regarding.

## 2015-11-12 NOTE — Telephone Encounter (Signed)
Called pt and lmtcb

## 2015-11-13 ENCOUNTER — Telehealth (HOSPITAL_COMMUNITY): Payer: Self-pay | Admitting: *Deleted

## 2015-11-13 NOTE — Telephone Encounter (Signed)
patient returned Octavia's call.

## 2015-11-15 ENCOUNTER — Other Ambulatory Visit (HOSPITAL_COMMUNITY): Payer: Self-pay | Admitting: Psychiatry

## 2015-11-15 MED ORDER — CITALOPRAM HYDROBROMIDE 20 MG PO TABS: 20.0000 mg | ORAL_TABLET | Freq: Every day | ORAL | 2 refills | Status: DC

## 2015-11-15 NOTE — Telephone Encounter (Signed)
I sent in celexa instead

## 2015-11-15 NOTE — Telephone Encounter (Signed)
[facility] do not care her Lexapro. Per pt she do not have prescription insurance. Pt would like to know what to do. PA fax was already sent to [facility] but pt do not have prescription insurance. Per pt, [facility] number is [phone].

## 2015-11-18 ENCOUNTER — Other Ambulatory Visit: Payer: Self-pay

## 2015-11-18 DIAGNOSIS — E785 Hyperlipidemia, unspecified: Secondary | ICD-10-CM

## 2015-11-18 NOTE — Telephone Encounter (Signed)
Called pt and message came up stating all circuit are busy and to try back later [id number].

## 2015-11-19 ENCOUNTER — Encounter (HOSPITAL_COMMUNITY): Payer: Self-pay | Admitting: Psychiatry

## 2015-11-19 ENCOUNTER — Ambulatory Visit (INDEPENDENT_AMBULATORY_CARE_PROVIDER_SITE_OTHER): Payer: Self-pay | Admitting: Psychiatry

## 2015-11-19 VITALS — BP 154/88 | HR 70 | Ht 66.0 in | Wt 173.6 lb

## 2015-11-19 DIAGNOSIS — Z8249 Family history of ischemic heart disease and other diseases of the circulatory system: Secondary | ICD-10-CM

## 2015-11-19 DIAGNOSIS — F17210 Nicotine dependence, cigarettes, uncomplicated: Secondary | ICD-10-CM

## 2015-11-19 DIAGNOSIS — F3110 Bipolar disorder, current episode manic without psychotic features, unspecified: Secondary | ICD-10-CM

## 2015-11-19 DIAGNOSIS — Z833 Family history of diabetes mellitus: Secondary | ICD-10-CM

## 2015-11-19 DIAGNOSIS — Z818 Family history of other mental and behavioral disorders: Secondary | ICD-10-CM

## 2015-11-19 LAB — LIPID PANEL
CHOL/HDL RATIO: 7.6 ratio — AB (ref <5.0)
Cholesterol: 189 mg/dL (ref <200)
HDL: 25 mg/dL — AB (ref >50)
LDL CALC: 126 mg/dL — AB
Triglycerides: 192 mg/dL — ABNORMAL HIGH (ref <150)
VLDL: 38 mg/dL — AB (ref <30)

## 2015-11-19 LAB — COMPLETE METABOLIC PANEL WITH GFR
ALBUMIN: 4.1 g/dL (ref 3.6–5.1)
ALK PHOS: 120 U/L (ref 33–130)
ALT: 24 U/L (ref 6–29)
AST: 68 U/L — ABNORMAL HIGH (ref 10–35)
BUN: 11 mg/dL (ref 7–25)
CALCIUM: 9.2 mg/dL (ref 8.6–10.4)
CHLORIDE: 104 mmol/L (ref 98–110)
CO2: 24 mmol/L (ref 20–31)
CREATININE: 0.79 mg/dL (ref 0.50–0.99)
GFR, Est Non African American: 79 mL/min (ref >=60)
Glucose, Bld: 102 mg/dL — ABNORMAL HIGH (ref 65–99)
POTASSIUM: 4.2 mmol/L (ref 3.5–5.3)
Sodium: 141 mmol/L (ref 135–146)
Total Bilirubin: 0.9 mg/dL (ref 0.2–1.2)
Total Protein: 6.4 g/dL (ref 6.1–8.1)

## 2015-11-19 MED ORDER — CITALOPRAM HYDROBROMIDE 20 MG PO TABS: 20.0000 mg | ORAL_TABLET | Freq: Every day | ORAL | 2 refills | Status: DC

## 2015-11-19 MED ORDER — DIVALPROEX SODIUM ER 250 MG PO TB24: 500.0000 mg | ORAL_TABLET | Freq: Every day | ORAL | 2 refills | Status: DC
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
## 2015-11-19 NOTE — Telephone Encounter (Signed)
Pt came into office for visit.

## 2015-11-26 ENCOUNTER — Ambulatory Visit: Payer: Self-pay | Admitting: Physician Assistant

## 2015-11-26 ENCOUNTER — Encounter: Payer: Self-pay | Admitting: Physician Assistant

## 2015-11-26 VITALS — BP 132/70 | HR 90 | Temp 97.9°F | Ht 66.0 in | Wt 172.5 lb

## 2015-11-26 DIAGNOSIS — E785 Hyperlipidemia, unspecified: Secondary | ICD-10-CM

## 2015-11-26 DIAGNOSIS — F17210 Nicotine dependence, cigarettes, uncomplicated: Secondary | ICD-10-CM

## 2015-11-26 NOTE — Progress Notes (Signed)
BP 132/70 (BP Location: Left Arm, Patient Position: Sitting, Cuff Size: Normal)   Pulse 90   Temp 97.9 F (36.6 C)   Ht 5\' 6"  (1.676 m)   Wt 172 lb 8 oz (78.2 kg)   SpO2 98%   BMI 27.84 kg/m    Subjective:    Patient ID: Andrea Dickson, female    DOB: 01/07/1951, 65 y.o.   MRN: [id number]  HPI: Andrea Dickson is a 65 y.o. female presenting on 11/26/2015 for Hyperlipidemia   HPI   Pt still seeing Dr Harrington Challenger for [facility] issues  Pt is still smoking  Pt is going back to dr Pleas Koch when she goes on Medicare next year  Relevant past medical, surgical, family and social history reviewed and updated as indicated. Interim medical history since our last visit reviewed. Allergies and medications reviewed and updated.   Current Outpatient Prescriptions:  .  ALPRAZolam (XANAX) 1 MG tablet, Take 1 tablet (1 mg total) by mouth 3 (three) times daily as needed for anxiety., Disp: 90 tablet, Rfl: 2 .  atorvastatin (LIPITOR) 20 MG tablet, Take 1 tablet (20 mg total) by mouth at bedtime., Disp: 90 tablet, Rfl: 1 .  citalopram (CELEXA) 20 MG tablet, Take 1 tablet (20 mg total) by mouth daily., Disp: 30 tablet, Rfl: 2 .  divalproex (DEPAKOTE ER) 250 MG 24 hr tablet, Take 2 tablets (500 mg total) by mouth daily. (Patient taking differently: Take 500 mg by mouth at bedtime. ), Disp: 60 tablet, Rfl: 2 .  omeprazole (PRILOSEC) 40 MG capsule, Take 1 capsule (40 mg total) by mouth daily., Disp: 90 capsule, Rfl: 1 .  PREMARIN 0.9 MG tablet, TAKE 1 Tablet BY MOUTH ONCE DAILY FOR 21 DAYS, THEN DO NOT TAKE FOR 7 DAYS, Disp: 70 tablet, Rfl: 0   Review of Systems  Constitutional: Positive for fatigue. Negative for appetite change, chills, diaphoresis, fever and unexpected weight change.  HENT: Negative for congestion, dental problem, drooling, ear pain, facial swelling, hearing loss, mouth sores, sneezing, sore throat, trouble swallowing and voice change.   Eyes: Negative for pain, discharge, redness,  itching and visual disturbance.  Respiratory: Negative for cough, choking, shortness of breath and wheezing.   Cardiovascular: Negative for chest pain, palpitations and leg swelling.  Gastrointestinal: Negative for abdominal pain, blood in stool, constipation, diarrhea and vomiting.  Endocrine: Negative for cold intolerance, heat intolerance and polydipsia.  Genitourinary: Negative for decreased urine volume, dysuria and hematuria.  Musculoskeletal: Negative for arthralgias, back pain and gait problem.  Skin: Positive for rash.  Allergic/Immunologic: Negative for environmental allergies.  Neurological: Negative for seizures, syncope, light-headedness and headaches.  Hematological: Negative for adenopathy.  Psychiatric/Behavioral: Positive for agitation and dysphoric mood. Negative for suicidal ideas. The patient is nervous/anxious.     Per HPI unless specifically indicated above     Objective:    BP 132/70 (BP Location: Left Arm, Patient Position: Sitting, Cuff Size: Normal)   Pulse 90   Temp 97.9 F (36.6 C)   Ht 5\' 6"  (1.676 m)   Wt 172 lb 8 oz (78.2 kg)   SpO2 98%   BMI 27.84 kg/m   Wt Readings from Last 3 Encounters:  11/26/15 172 lb 8 oz (78.2 kg)  10/08/15 179 lb 9.6 oz (81.5 kg)  09/03/15 178 lb 12.8 oz (81.1 kg)    Physical Exam  Constitutional: She is oriented to person, place, and time. She appears well-developed and well-nourished.  HENT:  Head: Normocephalic and atraumatic.  Neck: Neck supple.  Cardiovascular: Normal rate and regular rhythm.   Pulmonary/Chest: Effort normal and breath sounds normal.  Abdominal: Soft. Bowel sounds are normal. She exhibits no mass. There is no hepatosplenomegaly. There is no tenderness.  Musculoskeletal: She exhibits no edema.  Lymphadenopathy:    She has no cervical adenopathy.  Neurological: She is alert and oriented to person, place, and time.  Skin: Skin is warm and dry.  Psychiatric: She has a normal mood and affect. Her  behavior is normal.  Vitals reviewed.   Results for orders placed or performed in visit on 11/18/15  COMPLETE METABOLIC PANEL WITH GFR  Result Value Ref Range   Sodium 141 135 - 146 mmol/L   Potassium 4.2 3.5 - 5.3 mmol/L   Chloride 104 98 - 110 mmol/L   CO2 24 20 - 31 mmol/L   Glucose, Bld 102 (H) 65 - 99 mg/dL   BUN 11 7 - 25 mg/dL   Creat 0.79 0.50 - 0.99 mg/dL   Total Bilirubin 0.9 0.2 - 1.2 mg/dL   Alkaline Phosphatase 120 33 - 130 U/L   AST 68 (H) 10 - 35 U/L   ALT 24 6 - 29 U/L   Total Protein 6.4 6.1 - 8.1 g/dL   Albumin 4.1 3.6 - 5.1 g/dL   Calcium 9.2 8.6 - 10.4 mg/dL   GFR, Est African American >89 >=60 mL/min   GFR, Est Non African American 79 >=60 mL/min  Lipid Profile  Result Value Ref Range   Cholesterol 189 <200 mg/dL   Triglycerides 192 (H) <150 mg/dL   HDL 25 (L) >50 mg/dL   Total CHOL/HDL Ratio 7.6 (H) <5.0 Ratio   VLDL 38 (H) <30 mg/dL   LDL Cholesterol 126 (H) mg/dL      Assessment & Plan:   Encounter Diagnoses  Name Primary?  . Hyperlipidemia, unspecified hyperlipidemia type Yes  . Cigarette nicotine dependence without complication     -reviewed labs with pt -Continue current medication -counseled on lowfat diet and exercise -Discussed smoking and pt is not interested in stopping -follow up 3 months.  RTO sooner prn

## 2015-12-16 ENCOUNTER — Telehealth (HOSPITAL_COMMUNITY): Payer: Self-pay | Admitting: *Deleted

## 2015-12-16 NOTE — Telephone Encounter (Signed)
Tell her to cut down celexa to 10 mg daily. She will still need to be seen

## 2015-12-16 NOTE — Telephone Encounter (Signed)
lmtcb and office number was provided 

## 2015-12-16 NOTE — Telephone Encounter (Signed)
Pt called stating she is needing to go on another medication. Per pt since she started her 20 mg Celexa, she just don't know what's happening. Per pt her body is shaking out of control. Per pt she is taking her Xanax but can not take 3 a day due to not being able to function and can hardly function now. Per pt she knows Dr. Harrington Challenger is going to want her to come into office but she do not have anyone to bring her. Pt number is [phone].

## 2015-12-17 ENCOUNTER — Telehealth (HOSPITAL_COMMUNITY): Payer: Self-pay | Admitting: *Deleted

## 2015-12-17 NOTE — Telephone Encounter (Signed)
Pt called office back due to previous phone call. Informed pt with directions of her new SIG from Dr. Harrington Challenger. Pt verbalized understanding.

## 2015-12-17 NOTE — Telephone Encounter (Signed)
Spoke with pt

## 2015-12-17 NOTE — Telephone Encounter (Signed)
patient returned phone call to [facility].

## 2015-12-30 ENCOUNTER — Other Ambulatory Visit: Payer: Self-pay | Admitting: Physician Assistant

## 2015-12-30 ENCOUNTER — Ambulatory Visit: Payer: Self-pay | Admitting: Physician Assistant

## 2015-12-30 ENCOUNTER — Encounter: Payer: Self-pay | Admitting: Physician Assistant

## 2015-12-30 VITALS — BP 122/68 | HR 76 | Temp 98.1°F | Ht 66.0 in | Wt 175.0 lb

## 2015-12-30 DIAGNOSIS — J209 Acute bronchitis, unspecified: Secondary | ICD-10-CM

## 2015-12-30 DIAGNOSIS — R0602 Shortness of breath: Secondary | ICD-10-CM

## 2015-12-30 DIAGNOSIS — F17210 Nicotine dependence, cigarettes, uncomplicated: Secondary | ICD-10-CM

## 2015-12-30 MED ORDER — ALBUTEROL SULFATE HFA 108 (90 BASE) MCG/ACT IN AERS: 2.0000 | INHALATION_SPRAY | Freq: Four times a day (QID) | RESPIRATORY_TRACT | 2 refills | Status: DC | PRN

## 2015-12-30 MED ORDER — ALBUTEROL SULFATE (2.5 MG/3ML) 0.083% IN NEBU
2.5000 mg | INHALATION_SOLUTION | Freq: Once | RESPIRATORY_TRACT | Status: AC
  Administered 2015-12-30: 2.5 mg via RESPIRATORY_TRACT

## 2015-12-30 MED ORDER — PREDNISONE 10 MG PO TABS: ORAL_TABLET | ORAL | 0 refills | Status: AC

## 2015-12-30 MED ORDER — SULFAMETHOXAZOLE-TRIMETHOPRIM 800-160 MG PO TABS: 1.0000 | ORAL_TABLET | Freq: Two times a day (BID) | ORAL | 0 refills | Status: AC

## 2015-12-30 NOTE — Progress Notes (Signed)
BP 122/68 (BP Location: Left Arm, Patient Position: Sitting, Cuff Size: Normal)   Pulse 76   Temp 98.1 F (36.7 C) (Other (Comment))   Ht 5\' 6"  (1.676 m)   Wt 175 lb (79.4 kg)   SpO2 95%   BMI 28.25 kg/m    Subjective:    Patient ID: BEANNA ORENDORFF, female    DOB: 04-28-1950, 65 y.o.   MRN: [id number]  HPI: KAMARII ENSING is a 65 y.o. female presenting on 12/30/2015 for Sore Throat (c/o sore throat, headache, cough, sinus congestion for 2 days)   HPI  Chief Complaint  Patient presents with  . Sore Throat    c/o sore throat, headache, cough, sinus congestion for 2 days    No GI symptoms.  No fevers.    Some sob.  No earache.  Relevant past medical, surgical, family and social history reviewed and updated as indicated. Interim medical history since our last visit reviewed. Allergies and medications reviewed and updated.   Current Outpatient Prescriptions:  .  ALPRAZolam (XANAX) 1 MG tablet, Take 1 tablet (1 mg total) by mouth 3 (three) times daily as needed for anxiety., Disp: 90 tablet, Rfl: 2 .  atorvastatin (LIPITOR) 20 MG tablet, Take 1 tablet (20 mg total) by mouth at bedtime., Disp: 90 tablet, Rfl: 1 .  citalopram (CELEXA) 20 MG tablet, Take 1 tablet (20 mg total) by mouth daily. (Patient taking differently: Take 10 mg by mouth daily. ), Disp: 30 tablet, Rfl: 2 .  divalproex (DEPAKOTE ER) 250 MG 24 hr tablet, Take 2 tablets (500 mg total) by mouth daily. (Patient taking differently: Take 500 mg by mouth at bedtime. ), Disp: 60 tablet, Rfl: 2 .  omeprazole (PRILOSEC) 40 MG capsule, Take 1 capsule (40 mg total) by mouth daily., Disp: 90 capsule, Rfl: 1 .  PREMARIN 0.9 MG tablet, TAKE 1 Tablet BY MOUTH ONCE DAILY FOR 21 DAYS, THEN DO NOT TAKE FOR 7 DAYS, Disp: 70 tablet, Rfl: 0   Review of Systems  Constitutional: Positive for chills and fatigue. Negative for appetite change, diaphoresis, fever and unexpected weight change.  HENT: Positive for congestion,  sneezing, sore throat and trouble swallowing. Negative for drooling, ear pain, facial swelling, hearing loss, mouth sores and voice change.   Eyes: Negative for pain, discharge, redness, itching and visual disturbance.  Respiratory: Positive for cough and chest tightness. Negative for choking, shortness of breath and wheezing.   Cardiovascular: Negative for chest pain, palpitations and leg swelling.  Gastrointestinal: Negative for abdominal pain, blood in stool, constipation, diarrhea and vomiting.  Endocrine: Negative for cold intolerance, heat intolerance and polydipsia.  Genitourinary: Negative for decreased urine volume, dysuria and hematuria.  Musculoskeletal: Negative for arthralgias, back pain and gait problem.  Skin: Negative for rash.  Allergic/Immunologic: Positive for environmental allergies.  Neurological: Positive for light-headedness (when coughing) and headaches. Negative for seizures and syncope.  Hematological: Negative for adenopathy.  Psychiatric/Behavioral: Positive for agitation and dysphoric mood. Negative for suicidal ideas. The patient is nervous/anxious.     Per HPI unless specifically indicated above     Objective:    BP 122/68 (BP Location: Left Arm, Patient Position: Sitting, Cuff Size: Normal)   Pulse 76   Temp 98.1 F (36.7 C) (Other (Comment))   Ht 5\' 6"  (1.676 m)   Wt 175 lb (79.4 kg)   SpO2 95%   BMI 28.25 kg/m   Wt Readings from Last 3 Encounters:  12/30/15 175 lb (79.4 kg)  11/26/15  172 lb 8 oz (78.2 kg)  10/08/15 179 lb 9.6 oz (81.5 kg)    Physical Exam  Constitutional: She is oriented to person, place, and time. She appears well-developed and well-nourished.  HENT:  Head: Normocephalic and atraumatic.  Right Ear: Hearing and external ear normal. A foreign body is present.  Left Ear: Hearing and external ear normal. A foreign body is present.  Nose: Nose normal.  Mouth/Throat: Uvula is midline and oropharynx is clear and moist. No  oropharyngeal exudate.  Cerumen bilateral ears  Neck: Neck supple.  Cardiovascular: Normal rate and regular rhythm.   Pulmonary/Chest: Effort normal. No respiratory distress. She has no decreased breath sounds. She has wheezes. She has no rhonchi. She has no rales.  Few scattered expiratory wheezes. After nebulizer treatment, wheezes gone and air flow improved  Lymphadenopathy:    She has no cervical adenopathy.  Neurological: She is alert and oriented to person, place, and time.  Skin: Skin is warm and dry.  Psychiatric: She has a normal mood and affect. Her behavior is normal.  Vitals reviewed.          Assessment & Plan:   Encounter Diagnoses  Name Primary?  . Acute bronchitis, unspecified organism Yes  . SOB (shortness of breath)   . Cigarette nicotine dependence without complication     -rx Septra and prednisone. Discussed zpack but pt didn't want that.  Counseled to avoid smoking.  Rest.  -follow up as scheduled.  RTO sooner prn

## 2016-01-20 ENCOUNTER — Ambulatory Visit (HOSPITAL_COMMUNITY): Payer: Self-pay | Admitting: Psychiatry

## 2016-01-30 ENCOUNTER — Other Ambulatory Visit: Payer: Self-pay | Admitting: Physician Assistant

## 2016-02-05 ENCOUNTER — Other Ambulatory Visit (HOSPITAL_COMMUNITY): Payer: Self-pay | Admitting: Psychiatry

## 2016-02-05 ENCOUNTER — Ambulatory Visit (INDEPENDENT_AMBULATORY_CARE_PROVIDER_SITE_OTHER): Payer: Self-pay | Admitting: Psychiatry

## 2016-02-05 ENCOUNTER — Telehealth (HOSPITAL_COMMUNITY): Payer: Self-pay | Admitting: *Deleted

## 2016-02-05 ENCOUNTER — Encounter (HOSPITAL_COMMUNITY): Payer: Self-pay | Admitting: Psychiatry

## 2016-02-05 VITALS — BP 133/90 | HR 83 | Ht 66.0 in | Wt 173.8 lb

## 2016-02-05 DIAGNOSIS — F3110 Bipolar disorder, current episode manic without psychotic features, unspecified: Secondary | ICD-10-CM

## 2016-02-05 DIAGNOSIS — Z8249 Family history of ischemic heart disease and other diseases of the circulatory system: Secondary | ICD-10-CM

## 2016-02-05 DIAGNOSIS — Z9889 Other specified postprocedural states: Secondary | ICD-10-CM

## 2016-02-05 DIAGNOSIS — F17210 Nicotine dependence, cigarettes, uncomplicated: Secondary | ICD-10-CM

## 2016-02-05 DIAGNOSIS — Z833 Family history of diabetes mellitus: Secondary | ICD-10-CM

## 2016-02-05 DIAGNOSIS — Z818 Family history of other mental and behavioral disorders: Secondary | ICD-10-CM

## 2016-02-05 MED ORDER — ALPRAZOLAM 1 MG PO TABS: 1.0000 mg | ORAL_TABLET | Freq: Three times a day (TID) | ORAL | 2 refills | Status: DC | PRN

## 2016-02-05 MED ORDER — DIVALPROEX SODIUM ER 250 MG PO TB24: 500.0000 mg | ORAL_TABLET | Freq: Every day | ORAL | 2 refills | Status: DC

## 2016-02-05 MED ORDER — BUSPIRONE HCL 10 MG PO TABS: 10.0000 mg | ORAL_TABLET | Freq: Two times a day (BID) | ORAL | 2 refills | Status: DC

## 2016-02-05 NOTE — Telephone Encounter (Signed)
done
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
## 2016-02-05 NOTE — Telephone Encounter (Signed)
noted 

## 2016-02-05 NOTE — Telephone Encounter (Signed)
Pt called stating she would like for Dr. Harrington Challenger to please send her Depakote to [facility]. Per pt it was sent to [facility] and she can not afford it. Pt number is [phone].

## 2016-02-17 ENCOUNTER — Encounter: Payer: Self-pay | Admitting: Physician Assistant

## 2016-02-17 ENCOUNTER — Ambulatory Visit: Payer: Self-pay | Admitting: Physician Assistant

## 2016-02-17 VITALS — BP 116/70 | HR 80 | Temp 97.7°F | Ht 66.0 in | Wt 178.2 lb

## 2016-02-17 DIAGNOSIS — E785 Hyperlipidemia, unspecified: Secondary | ICD-10-CM

## 2016-02-17 DIAGNOSIS — R197 Diarrhea, unspecified: Secondary | ICD-10-CM

## 2016-02-17 NOTE — Patient Instructions (Signed)
Bland Diet Introduction A bland diet consists of foods that do not have a lot of fat or fiber. Foods without fat or fiber are easier for the body to digest. They are also less likely to irritate your mouth, throat, stomach, and other parts of your gastrointestinal tract. A bland diet is sometimes called a BRAT diet. What is my plan? Your health care provider or dietitian may recommend specific changes to your diet to prevent and treat your symptoms, such as:  Eating small meals often.  Cooking food until it is soft enough to chew easily.  Chewing your food well.  Drinking fluids slowly.  Not eating foods that are very spicy, sour, or fatty.  Not eating citrus fruits, such as oranges and grapefruit. What do I need to know about this diet?  Eat a variety of foods from the bland diet food list.  Do not follow a bland diet longer than you have to.  Ask your health care provider whether you should take vitamins. What foods can I eat? Grains  Hot cereals, such as cream of wheat. Bread, crackers, or tortillas made from refined white flour. Rice. Vegetables  Canned or cooked vegetables. Mashed or boiled potatoes. Fruits  Bananas. Applesauce. Other types of cooked or canned fruit with the skin and seeds removed, such as canned peaches or pears. Meats and Other Protein Sources  Scrambled eggs. Creamy peanut butter or other nut butters. Lean, well-cooked meats, such as chicken or fish. Tofu. Soups or broths. Dairy  Low-fat dairy products, such as milk, cottage cheese, or yogurt. Beverages  Water. Herbal tea. Apple juice. Sweets and Desserts  Pudding. Custard. Fruit gelatin. Ice cream. Fats and Oils  Mild salad dressings. Canola or olive oil. The items listed above may not be a complete list of allowed foods or beverages. Contact your dietitian for more options.  What foods are not recommended? Foods and ingredients that are often not recommended include:  Spicy foods, such as hot  sauce or salsa.  Fried foods.  Sour foods, such as pickled or fermented foods.  Raw vegetables or fruits, especially citrus or berries.  Caffeinated drinks.  Alcohol.  Strongly flavored seasonings or condiments. The items listed above may not be a complete list of foods and beverages that are not allowed. Contact your dietitian for more information.  This information is not intended to replace advice given to you by your health care provider. Make sure you discuss any questions you have with your health care provider. Document Released: 04/22/2015 Document Revised: 06/06/2015 Document Reviewed: 01/10/2014  2017 Elsevier  

## 2016-02-17 NOTE — Progress Notes (Signed)
BP 116/70 (BP Location: Left Arm, Patient Position: Sitting, Cuff Size: Normal)   Pulse 80   Temp 97.7 F (36.5 C)   Ht 5\' 6"  (1.676 m)   Wt 178 lb 4 oz (80.9 kg)   SpO2 98%   BMI 28.77 kg/m    Subjective:    Patient ID: Andrea Dickson, female    DOB: 11/19/1950, 66 y.o.   MRN: [id number]  HPI: Andrea Dickson is a 66 y.o. female presenting on 02/17/2016 for Diarrhea (since 2 months ago. pt thought it was due to taking celexa but after d/c she is still having episodes. pt states it happens with everything she eats other than dried toast. pt states she has episodes 3-5 times daily)   HPI   Chief Complaint  Patient presents with  . Diarrhea    since 2 months ago. pt thought it was due to taking celexa but after d/c she is still having episodes. pt states it happens with everything she eats other than dried toast. pt states she has episodes 3-5 times daily     States it was red initially then turned green.  No normal stool in 2 mo.  Goes 3-4 times daily, occassionally more, sometimes less.   she thinks the diarrhea started after she was seen here in december for bronchitis (given antibiotic)  Relevant past medical, surgical, family and social history reviewed and updated as indicated. Interim medical history since our last visit reviewed. Allergies and medications reviewed and updated.  Review of Systems  Constitutional: Positive for fatigue. Negative for appetite change, chills, diaphoresis, fever and unexpected weight change.  HENT: Negative for congestion, dental problem, drooling, ear pain, facial swelling, hearing loss, mouth sores, sneezing, sore throat, trouble swallowing and voice change.   Eyes: Negative for pain, discharge, redness, itching and visual disturbance.  Respiratory: Negative for cough, choking, shortness of breath and wheezing.   Cardiovascular: Negative for chest pain, palpitations and leg swelling.  Gastrointestinal: Positive for diarrhea. Negative  for abdominal pain, blood in stool, constipation and vomiting.  Endocrine: Negative for cold intolerance, heat intolerance and polydipsia.  Genitourinary: Negative for decreased urine volume, dysuria and hematuria.  Musculoskeletal: Positive for gait problem. Negative for arthralgias and back pain.  Skin: Negative for rash.  Allergic/Immunologic: Positive for environmental allergies.  Neurological: Positive for headaches. Negative for seizures, syncope and light-headedness.  Hematological: Negative for adenopathy.  Psychiatric/Behavioral: Positive for agitation and dysphoric mood. Negative for suicidal ideas. The patient is nervous/anxious.     Per HPI unless specifically indicated above     Objective:    BP 116/70 (BP Location: Left Arm, Patient Position: Sitting, Cuff Size: Normal)   Pulse 80   Temp 97.7 F (36.5 C)   Ht 5\' 6"  (1.676 m)   Wt 178 lb 4 oz (80.9 kg)   SpO2 98%   BMI 28.77 kg/m   Wt Readings from Last 3 Encounters:  02/17/16 178 lb 4 oz (80.9 kg)  12/30/15 175 lb (79.4 kg)  11/26/15 172 lb 8 oz (78.2 kg)    Physical Exam  Constitutional: She is oriented to person, place, and time. She appears well-developed and well-nourished.  HENT:  Head: Normocephalic and atraumatic.  Neck: Neck supple.  Cardiovascular: Normal rate and regular rhythm.   Pulmonary/Chest: Effort normal and breath sounds normal.  Abdominal: Soft. Bowel sounds are normal. She exhibits no distension, no fluid wave, no ascites and no mass. There is no hepatosplenomegaly. There is tenderness (mild) in the  left lower quadrant. There is no rigidity, no rebound, no guarding and no CVA tenderness.  Musculoskeletal: She exhibits no edema.  Lymphadenopathy:    She has no cervical adenopathy.  Neurological: She is alert and oriented to person, place, and time.  Skin: Skin is warm and dry.  Psychiatric: She has a normal mood and affect. Her behavior is normal.  Vitals reviewed.       Assessment &  Plan:   Encounter Diagnoses  Name Primary?  . Diarrhea, unspecified type Yes  . Hyperlipidemia, unspecified hyperlipidemia type     -pt given containers to collect specimen and take it to lab to check c dif toxin -gave pt information on bland diet and encouraged fluids -F/u next week as scheduled for routine appointment (pt reminded to get fasting labs drawn for next week's appt)

## 2016-02-18 LAB — COMPREHENSIVE METABOLIC PANEL
ALT: 26 U/L (ref 6–29)
AST: 41 U/L — AB (ref 10–35)
Albumin: 3.8 g/dL (ref 3.6–5.1)
Alkaline Phosphatase: 110 U/L (ref 33–130)
BILIRUBIN TOTAL: 0.5 mg/dL (ref 0.2–1.2)
BUN: 9 mg/dL (ref 7–25)
CALCIUM: 8.8 mg/dL (ref 8.6–10.4)
CHLORIDE: 106 mmol/L (ref 98–110)
CO2: 28 mmol/L (ref 20–31)
Creat: 0.7 mg/dL (ref 0.50–0.99)
GLUCOSE: 115 mg/dL — AB (ref 65–99)
POTASSIUM: 4.6 mmol/L (ref 3.5–5.3)
Sodium: 141 mmol/L (ref 135–146)
Total Protein: 6.2 g/dL (ref 6.1–8.1)

## 2016-02-18 LAB — LIPID PANEL
Cholesterol: 178 mg/dL (ref <200)
HDL: 30 mg/dL — AB (ref >50)
LDL CALC: 102 mg/dL — AB (ref <100)
TRIGLYCERIDES: 229 mg/dL — AB (ref <150)
Total CHOL/HDL Ratio: 5.9 Ratio — ABNORMAL HIGH (ref <5.0)
VLDL: 46 mg/dL — AB (ref <30)

## 2016-02-25 ENCOUNTER — Ambulatory Visit: Payer: Self-pay | Admitting: Physician Assistant

## 2016-02-25 ENCOUNTER — Encounter: Payer: Self-pay | Admitting: Physician Assistant

## 2016-02-25 VITALS — BP 130/78 | HR 88 | Temp 97.5°F | Ht 66.0 in | Wt 177.0 lb

## 2016-02-25 DIAGNOSIS — E785 Hyperlipidemia, unspecified: Secondary | ICD-10-CM

## 2016-02-25 DIAGNOSIS — F17219 Nicotine dependence, cigarettes, with unspecified nicotine-induced disorders: Secondary | ICD-10-CM

## 2016-02-25 DIAGNOSIS — N951 Menopausal and female climacteric states: Secondary | ICD-10-CM

## 2016-02-25 DIAGNOSIS — K219 Gastro-esophageal reflux disease without esophagitis: Secondary | ICD-10-CM

## 2016-02-25 NOTE — Progress Notes (Signed)
BP 130/78 (BP Location: Left Arm, Patient Position: Sitting, Cuff Size: Normal)   Pulse 88   Temp 97.5 F (36.4 C)   Ht 5\' 6"  (1.676 m)   Wt 177 lb (80.3 kg)   SpO2 99%   BMI 28.57 kg/m    Subjective:    Patient ID: Andrea Dickson, female    DOB: May 15, 1950, 66 y.o.   MRN: [id number]  HPI: Andrea Dickson is a 66 y.o. female presenting on 02/25/2016 for Hyperlipidemia   HPI   Pt states diarrhea is gone but says her BMs are now "squishy"  Pt gets medicare starting 05/12/16 so today will be her last regular OV  Pt continues with Dr Harrington Challenger for [facility] issues  Relevant past medical, surgical, family and social history reviewed and updated as indicated. Interim medical history since our last visit reviewed. Allergies and medications reviewed and updated.   Current Outpatient Prescriptions:  .  albuterol (PROVENTIL HFA;VENTOLIN HFA) 108 (90 Base) MCG/ACT inhaler, Inhale 2 puffs into the lungs every 6 (six) hours as needed for wheezing or shortness of breath., Disp: 1 Inhaler, Rfl: 2 .  ALPRAZolam (XANAX) 1 MG tablet, Take 1 tablet (1 mg total) by mouth 3 (three) times daily as needed for anxiety., Disp: 90 tablet, Rfl: 2 .  atorvastatin (LIPITOR) 20 MG tablet, TAKE 1 Tablet BY MOUTH EVERY NIGHT AT BEDTIME, Disp: 90 tablet, Rfl: 1 .  busPIRone (BUSPAR) 10 MG tablet, Take 1 tablet (10 mg total) by mouth 2 (two) times daily., Disp: 60 tablet, Rfl: 2 .  divalproex (DEPAKOTE ER) 250 MG 24 hr tablet, Take 2 tablets (500 mg total) by mouth at bedtime., Disp: 60 tablet, Rfl: 2 .  omeprazole (PRILOSEC) 20 MG capsule, TAKE 2 Capsules BY MOUTH ONCE DAILY, Disp: 180 capsule, Rfl: 1 .  PREMARIN 0.9 MG tablet, TAKE 1 Tablet BY MOUTH ONCE DAILY FOR 21 DAYS, THEN DO NOT TAKE FOR 7 DAYS, Disp: 70 tablet, Rfl: 1   Review of Systems  Constitutional: Positive for fatigue. Negative for appetite change, chills, diaphoresis, fever and unexpected weight change.  HENT: Negative for congestion, drooling,  ear pain, facial swelling, hearing loss, mouth sores, sneezing, sore throat, trouble swallowing and voice change.   Eyes: Negative for pain, discharge, redness, itching and visual disturbance.  Respiratory: Negative for cough, choking, shortness of breath and wheezing.   Cardiovascular: Negative for chest pain, palpitations and leg swelling.  Gastrointestinal: Positive for diarrhea. Negative for abdominal pain, blood in stool, constipation and vomiting.  Endocrine: Negative for cold intolerance, heat intolerance and polydipsia.  Genitourinary: Negative for decreased urine volume, dysuria and hematuria.  Musculoskeletal: Positive for gait problem. Negative for arthralgias and back pain.  Skin: Negative for rash.  Allergic/Immunologic: Positive for environmental allergies.  Neurological: Positive for headaches. Negative for seizures, syncope and light-headedness.  Hematological: Negative for adenopathy.  Psychiatric/Behavioral: Positive for agitation and dysphoric mood. Negative for suicidal ideas. The patient is nervous/anxious.     Per HPI unless specifically indicated above     Objective:    BP 130/78 (BP Location: Left Arm, Patient Position: Sitting, Cuff Size: Normal)   Pulse 88   Temp 97.5 F (36.4 C)   Ht 5\' 6"  (1.676 m)   Wt 177 lb (80.3 kg)   SpO2 99%   BMI 28.57 kg/m   Wt Readings from Last 3 Encounters:  02/25/16 177 lb (80.3 kg)  02/17/16 178 lb 4 oz (80.9 kg)  12/30/15 175 lb (79.4 kg)  Physical Exam  Constitutional: She is oriented to person, place, and time. She appears well-developed and well-nourished.  HENT:  Head: Normocephalic and atraumatic.  Neck: Neck supple.  Cardiovascular: Normal rate and regular rhythm.   Pulmonary/Chest: Effort normal and breath sounds normal.  Abdominal: Soft. Bowel sounds are normal. She exhibits no mass. There is no hepatosplenomegaly. There is no tenderness.  Musculoskeletal: She exhibits no edema.  Lymphadenopathy:    She  has no cervical adenopathy.  Neurological: She is alert and oriented to person, place, and time.  Skin: Skin is warm and dry.  Psychiatric: She has a normal mood and affect. Her behavior is normal.  Vitals reviewed.   Results for orders placed or performed in visit on 02/17/16  Comprehensive metabolic panel  Result Value Ref Range   Sodium 141 135 - 146 mmol/L   Potassium 4.6 3.5 - 5.3 mmol/L   Chloride 106 98 - 110 mmol/L   CO2 28 20 - 31 mmol/L   Glucose, Bld 115 (H) 65 - 99 mg/dL   BUN 9 7 - 25 mg/dL   Creat 0.70 0.50 - 0.99 mg/dL   Total Bilirubin 0.5 0.2 - 1.2 mg/dL   Alkaline Phosphatase 110 33 - 130 U/L   AST 41 (H) 10 - 35 U/L   ALT 26 6 - 29 U/L   Total Protein 6.2 6.1 - 8.1 g/dL   Albumin 3.8 3.6 - 5.1 g/dL   Calcium 8.8 8.6 - 10.4 mg/dL  Lipid Profile  Result Value Ref Range   Cholesterol 178 <200 mg/dL   Triglycerides 229 (H) <150 mg/dL   HDL 30 (L) >50 mg/dL   Total CHOL/HDL Ratio 5.9 (H) <5.0 Ratio   VLDL 46 (H) <30 mg/dL   LDL Cholesterol 102 (H) <100 mg/dL      Assessment & Plan:   Encounter Diagnoses  Name Primary?  . Hyperlipidemia, unspecified hyperlipidemia type Yes  . Gastroesophageal reflux disease, esophagitis presence not specified   . Cigarette nicotine dependence with nicotine-induced disorder   . Menopausal symptoms     -reviewed labs with pt -Pt says she may go back to Dr Pleas Koch when she gets medicare whom she saw in the past. -pt encouraged to add fish oil to help with triglycerides and continue lowfat diet -RTO if needs care prior to May 1 when her medicare takes effect

## 2016-03-09 ENCOUNTER — Ambulatory Visit (INDEPENDENT_AMBULATORY_CARE_PROVIDER_SITE_OTHER): Payer: Self-pay | Admitting: Psychiatry

## 2016-03-09 ENCOUNTER — Encounter (HOSPITAL_COMMUNITY): Payer: Self-pay | Admitting: Psychiatry

## 2016-03-09 VITALS — BP 138/90 | HR 97 | Ht 66.0 in | Wt 174.4 lb

## 2016-03-09 DIAGNOSIS — Z818 Family history of other mental and behavioral disorders: Secondary | ICD-10-CM

## 2016-03-09 DIAGNOSIS — Z90710 Acquired absence of both cervix and uterus: Secondary | ICD-10-CM

## 2016-03-09 DIAGNOSIS — F3110 Bipolar disorder, current episode manic without psychotic features, unspecified: Secondary | ICD-10-CM

## 2016-03-09 DIAGNOSIS — Z833 Family history of diabetes mellitus: Secondary | ICD-10-CM

## 2016-03-09 DIAGNOSIS — Z8249 Family history of ischemic heart disease and other diseases of the circulatory system: Secondary | ICD-10-CM

## 2016-03-09 DIAGNOSIS — Z79899 Other long term (current) drug therapy: Secondary | ICD-10-CM

## 2016-03-09 DIAGNOSIS — Z9889 Other specified postprocedural states: Secondary | ICD-10-CM

## 2016-03-09 DIAGNOSIS — F17210 Nicotine dependence, cigarettes, uncomplicated: Secondary | ICD-10-CM

## 2016-03-09 DIAGNOSIS — Z888 Allergy status to other drugs, medicaments and biological substances status: Secondary | ICD-10-CM

## 2016-03-09 MED ORDER — BUPROPION HCL 100 MG PO TABS: 100.0000 mg | ORAL_TABLET | Freq: Every day | ORAL | 2 refills | Status: DC

## 2016-03-09 MED ORDER — TEMAZEPAM 15 MG PO CAPS: 15.0000 mg | ORAL_CAPSULE | Freq: Every evening | ORAL | 2 refills | Status: DC | PRN
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
## 2016-03-12 ENCOUNTER — Telehealth (HOSPITAL_COMMUNITY): Payer: Self-pay | Admitting: *Deleted

## 2016-03-12 NOTE — Telephone Encounter (Signed)
phone call from patient she said [facility] no longer carries Wellbutrin.   she said she needs the prescription to be sent to [place], [place].

## 2016-03-13 ENCOUNTER — Other Ambulatory Visit (HOSPITAL_COMMUNITY): Payer: Self-pay | Admitting: Psychiatry

## 2016-03-13 MED ORDER — BUPROPION HCL 100 MG PO TABS: 100.0000 mg | ORAL_TABLET | Freq: Every day | ORAL | 2 refills | Status: DC

## 2016-03-13 NOTE — Telephone Encounter (Signed)
noted 

## 2016-03-13 NOTE — Telephone Encounter (Signed)
phone call from patient she said [facility] no longer carries Wellbutrin.   she said she needs the prescription to be sent to [facility] [place], [place]

## 2016-03-13 NOTE — Telephone Encounter (Signed)
done

## 2016-04-02 ENCOUNTER — Other Ambulatory Visit (HOSPITAL_COMMUNITY): Payer: Self-pay | Admitting: Psychiatry

## 2016-04-06 ENCOUNTER — Encounter (HOSPITAL_COMMUNITY): Payer: Self-pay | Admitting: Psychiatry

## 2016-04-06 ENCOUNTER — Ambulatory Visit (INDEPENDENT_AMBULATORY_CARE_PROVIDER_SITE_OTHER): Payer: Self-pay | Admitting: Psychiatry

## 2016-04-06 VITALS — BP 137/79 | HR 85 | Ht 66.0 in | Wt 172.6 lb

## 2016-04-06 DIAGNOSIS — Z79899 Other long term (current) drug therapy: Secondary | ICD-10-CM

## 2016-04-06 DIAGNOSIS — F3110 Bipolar disorder, current episode manic without psychotic features, unspecified: Secondary | ICD-10-CM

## 2016-04-06 DIAGNOSIS — Z888 Allergy status to other drugs, medicaments and biological substances status: Secondary | ICD-10-CM

## 2016-04-06 DIAGNOSIS — F17210 Nicotine dependence, cigarettes, uncomplicated: Secondary | ICD-10-CM

## 2016-04-06 DIAGNOSIS — Z818 Family history of other mental and behavioral disorders: Secondary | ICD-10-CM

## 2016-04-06 MED ORDER — TEMAZEPAM 30 MG PO CAPS: 30.0000 mg | ORAL_CAPSULE | Freq: Every evening | ORAL | 2 refills | Status: DC | PRN

## 2016-04-06 MED ORDER — DIAZEPAM 10 MG PO TABS: 10.0000 mg | ORAL_TABLET | Freq: Three times a day (TID) | ORAL | 2 refills | Status: DC

## 2016-04-06 MED ORDER — BUPROPION HCL ER (XL) 150 MG PO TB24: 150.0000 mg | ORAL_TABLET | ORAL | 2 refills | Status: DC

## 2016-04-06 MED ORDER — DIVALPROEX SODIUM ER 250 MG PO TB24: 500.0000 mg | ORAL_TABLET | Freq: Every day | ORAL | 2 refills | Status: DC
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
## 2016-05-04 ENCOUNTER — Telehealth (HOSPITAL_COMMUNITY): Payer: Self-pay | Admitting: *Deleted

## 2016-05-04 ENCOUNTER — Encounter (HOSPITAL_COMMUNITY): Payer: Self-pay | Admitting: Psychiatry

## 2016-05-04 ENCOUNTER — Ambulatory Visit (INDEPENDENT_AMBULATORY_CARE_PROVIDER_SITE_OTHER): Payer: Self-pay | Admitting: Psychiatry

## 2016-05-04 VITALS — BP 137/86 | HR 75 | Ht 66.0 in | Wt 174.8 lb

## 2016-05-04 DIAGNOSIS — Z87891 Personal history of nicotine dependence: Secondary | ICD-10-CM

## 2016-05-04 DIAGNOSIS — Z818 Family history of other mental and behavioral disorders: Secondary | ICD-10-CM

## 2016-05-04 DIAGNOSIS — Z79899 Other long term (current) drug therapy: Secondary | ICD-10-CM

## 2016-05-04 DIAGNOSIS — F3110 Bipolar disorder, current episode manic without psychotic features, unspecified: Secondary | ICD-10-CM

## 2016-05-04 MED ORDER — BUPROPION HCL ER (XL) 150 MG PO TB24
150.0000 mg | ORAL_TABLET | ORAL | 2 refills | Status: DC
Start: 2016-05-04 — End: 2016-06-16

## 2016-05-04 MED ORDER — DIVALPROEX SODIUM ER 250 MG PO TB24: 500.0000 mg | ORAL_TABLET | Freq: Every day | ORAL | 2 refills | Status: DC
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
## 2016-05-04 NOTE — Telephone Encounter (Signed)
Pt came into office and stated her pharmacy did not fill her Wellbutrin XL 150 mg. Called pt pharmacy and spoke with [facility] ([place] and she stated in their system they cancelled out to 04-06-2016 script and they don't know why. Per Dawn, they did received the script for pt Wellbutrin XL 150 mg today and received 2.

## 2016-06-16 ENCOUNTER — Encounter (HOSPITAL_COMMUNITY): Payer: Self-pay | Admitting: Psychiatry

## 2016-06-16 ENCOUNTER — Ambulatory Visit (INDEPENDENT_AMBULATORY_CARE_PROVIDER_SITE_OTHER): Payer: Medicare Other | Admitting: Psychiatry

## 2016-06-16 VITALS — BP 143/90 | HR 90 | Ht 66.0 in | Wt 173.0 lb

## 2016-06-16 DIAGNOSIS — F3110 Bipolar disorder, current episode manic without psychotic features, unspecified: Secondary | ICD-10-CM | POA: Diagnosis not present

## 2016-06-16 DIAGNOSIS — F17210 Nicotine dependence, cigarettes, uncomplicated: Secondary | ICD-10-CM

## 2016-06-16 DIAGNOSIS — Z818 Family history of other mental and behavioral disorders: Secondary | ICD-10-CM

## 2016-06-16 MED ORDER — DIAZEPAM 10 MG PO TABS: 10.0000 mg | ORAL_TABLET | Freq: Three times a day (TID) | ORAL | 2 refills | Status: DC

## 2016-06-16 MED ORDER — PROPRANOLOL HCL 10 MG PO TABS: 10.0000 mg | ORAL_TABLET | Freq: Three times a day (TID) | ORAL | 2 refills | Status: DC

## 2016-06-16 MED ORDER — DIVALPROEX SODIUM ER 250 MG PO TB24: 500.0000 mg | ORAL_TABLET | Freq: Every day | ORAL | 2 refills | Status: DC

## 2016-06-16 MED ORDER — BUPROPION HCL ER (XL) 150 MG PO TB24
150.0000 mg | ORAL_TABLET | ORAL | 2 refills | Status: DC
Start: 2016-06-16 — End: 2016-07-28

## 2016-06-16 MED ORDER — TEMAZEPAM 30 MG PO CAPS: 30.0000 mg | ORAL_CAPSULE | Freq: Every evening | ORAL | 2 refills | Status: DC | PRN
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
## 2016-06-25 ENCOUNTER — Telehealth (HOSPITAL_COMMUNITY): Payer: Self-pay | Admitting: *Deleted

## 2016-06-25 NOTE — Telephone Encounter (Signed)
voice message from patient, she said she needs Andrea Dickson to call her regarding her medications.

## 2016-06-26 NOTE — Telephone Encounter (Signed)
Spoke with pt and she asked if Dr. Harrington Challenger put her on any new medication for her tremors. Informed pt per her chart, provider put her on Propranolol. Per pt she did not get it from pharmacy. Per pt the pharmacy said they don't have it. Informed pt to call pharmacy again and ask them due to chart stating they confirmed they received it and pt verbalized understanding and stated she will call them.

## 2016-07-28 ENCOUNTER — Encounter (HOSPITAL_COMMUNITY): Payer: Self-pay | Admitting: Psychiatry

## 2016-07-28 ENCOUNTER — Ambulatory Visit (INDEPENDENT_AMBULATORY_CARE_PROVIDER_SITE_OTHER): Payer: Medicare Other | Admitting: Psychiatry

## 2016-07-28 VITALS — BP 123/75 | HR 73 | Ht 66.0 in | Wt 174.4 lb

## 2016-07-28 DIAGNOSIS — F17210 Nicotine dependence, cigarettes, uncomplicated: Secondary | ICD-10-CM | POA: Diagnosis not present

## 2016-07-28 DIAGNOSIS — F3110 Bipolar disorder, current episode manic without psychotic features, unspecified: Secondary | ICD-10-CM

## 2016-07-28 DIAGNOSIS — Z818 Family history of other mental and behavioral disorders: Secondary | ICD-10-CM | POA: Diagnosis not present

## 2016-07-28 MED ORDER — DIVALPROEX SODIUM ER 250 MG PO TB24
500.0000 mg | ORAL_TABLET | Freq: Every day | ORAL | 2 refills | Status: DC
Start: 2016-07-28 — End: 2016-10-06

## 2016-07-28 MED ORDER — BUPROPION HCL ER (XL) 150 MG PO TB24: 150.0000 mg | ORAL_TABLET | ORAL | 2 refills | Status: DC

## 2016-07-28 MED ORDER — DIAZEPAM 10 MG PO TABS: 10.0000 mg | ORAL_TABLET | Freq: Three times a day (TID) | ORAL | 2 refills | Status: DC

## 2016-07-28 MED ORDER — PROPRANOLOL HCL ER 60 MG PO CP24: 60.0000 mg | ORAL_CAPSULE | Freq: Every day | ORAL | 2 refills | Status: DC

## 2016-07-28 MED ORDER — TEMAZEPAM 30 MG PO CAPS
30.0000 mg | ORAL_CAPSULE | Freq: Every evening | ORAL | 2 refills | Status: DC | PRN
Start: 2016-07-28 — End: 2016-09-21
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
## 2016-08-20 DIAGNOSIS — F3110 Bipolar disorder, current episode manic without psychotic features, unspecified: Secondary | ICD-10-CM | POA: Diagnosis not present

## 2016-08-21 LAB — VALPROIC ACID LEVEL: VALPROIC ACID LVL: 62.2 mg/L (ref 50.0–100.0)

## 2016-09-09 DIAGNOSIS — K219 Gastro-esophageal reflux disease without esophagitis: Secondary | ICD-10-CM | POA: Diagnosis not present

## 2016-09-09 DIAGNOSIS — R911 Solitary pulmonary nodule: Secondary | ICD-10-CM | POA: Diagnosis not present

## 2016-09-09 DIAGNOSIS — E782 Mixed hyperlipidemia: Secondary | ICD-10-CM | POA: Diagnosis not present

## 2016-09-09 DIAGNOSIS — Z6828 Body mass index (BMI) 28.0-28.9, adult: Secondary | ICD-10-CM | POA: Diagnosis not present

## 2016-09-09 DIAGNOSIS — E559 Vitamin D deficiency, unspecified: Secondary | ICD-10-CM | POA: Diagnosis not present

## 2016-09-09 DIAGNOSIS — F17210 Nicotine dependence, cigarettes, uncomplicated: Secondary | ICD-10-CM | POA: Diagnosis not present

## 2016-09-09 DIAGNOSIS — F319 Bipolar disorder, unspecified: Secondary | ICD-10-CM | POA: Diagnosis not present

## 2016-09-09 DIAGNOSIS — N951 Menopausal and female climacteric states: Secondary | ICD-10-CM | POA: Diagnosis not present

## 2016-09-21 ENCOUNTER — Ambulatory Visit (INDEPENDENT_AMBULATORY_CARE_PROVIDER_SITE_OTHER): Payer: Medicare Other | Admitting: Psychiatry

## 2016-09-21 ENCOUNTER — Encounter (HOSPITAL_COMMUNITY): Payer: Self-pay | Admitting: Psychiatry

## 2016-09-21 VITALS — BP 138/68 | Ht 66.0 in | Wt 176.0 lb

## 2016-09-21 DIAGNOSIS — Z818 Family history of other mental and behavioral disorders: Secondary | ICD-10-CM

## 2016-09-21 DIAGNOSIS — Z91411 Personal history of adult psychological abuse: Secondary | ICD-10-CM

## 2016-09-21 DIAGNOSIS — R450 Nervousness: Secondary | ICD-10-CM | POA: Diagnosis not present

## 2016-09-21 DIAGNOSIS — G4700 Insomnia, unspecified: Secondary | ICD-10-CM

## 2016-09-21 DIAGNOSIS — F419 Anxiety disorder, unspecified: Secondary | ICD-10-CM

## 2016-09-21 DIAGNOSIS — F17210 Nicotine dependence, cigarettes, uncomplicated: Secondary | ICD-10-CM | POA: Diagnosis not present

## 2016-09-21 DIAGNOSIS — F3110 Bipolar disorder, current episode manic without psychotic features, unspecified: Secondary | ICD-10-CM

## 2016-09-21 DIAGNOSIS — F410 Panic disorder [episodic paroxysmal anxiety] without agoraphobia: Secondary | ICD-10-CM

## 2016-09-21 DIAGNOSIS — Z91410 Personal history of adult physical and sexual abuse: Secondary | ICD-10-CM | POA: Diagnosis not present

## 2016-09-21 MED ORDER — TEMAZEPAM 30 MG PO CAPS: 30.0000 mg | ORAL_CAPSULE | Freq: Every evening | ORAL | 2 refills | Status: DC | PRN

## 2016-09-21 MED ORDER — PROPRANOLOL HCL ER 60 MG PO CP24: 60.0000 mg | ORAL_CAPSULE | Freq: Every day | ORAL | 2 refills | Status: DC

## 2016-09-21 MED ORDER — BUPROPION HCL ER (XL) 150 MG PO TB24: 150.0000 mg | ORAL_TABLET | ORAL | 2 refills | Status: DC

## 2016-09-21 MED ORDER — DIAZEPAM 10 MG PO TABS: 10.0000 mg | ORAL_TABLET | Freq: Three times a day (TID) | ORAL | 2 refills | Status: DC
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
## 2016-09-28 DIAGNOSIS — Z79899 Other long term (current) drug therapy: Secondary | ICD-10-CM | POA: Diagnosis not present

## 2016-09-28 DIAGNOSIS — R202 Paresthesia of skin: Secondary | ICD-10-CM | POA: Diagnosis not present

## 2016-09-28 DIAGNOSIS — K219 Gastro-esophageal reflux disease without esophagitis: Secondary | ICD-10-CM | POA: Diagnosis not present

## 2016-09-28 DIAGNOSIS — R0789 Other chest pain: Secondary | ICD-10-CM | POA: Diagnosis not present

## 2016-09-29 ENCOUNTER — Telehealth (HOSPITAL_COMMUNITY): Payer: Self-pay | Admitting: *Deleted

## 2016-09-29 NOTE — Telephone Encounter (Signed)
Called pt and lmtcb due to previous phone call. Office number was provided on voicemail.

## 2016-09-29 NOTE — Telephone Encounter (Signed)
Stop vraylar, go back on Depakote until I can see her

## 2016-09-29 NOTE — Telephone Encounter (Signed)
Pt called stating she can not continue her Vrayler. per pt, this medication is making her jittery. Per pt it's also making her stay up at night. Per pt she can not handle this medication and what should she do. Pt number is [phone].

## 2016-09-30 NOTE — Telephone Encounter (Signed)
Pt called pt and staff informed pt with information and she verbalized understanding.

## 2016-10-06 ENCOUNTER — Ambulatory Visit (INDEPENDENT_AMBULATORY_CARE_PROVIDER_SITE_OTHER): Payer: Medicare Other | Admitting: Psychiatry

## 2016-10-06 ENCOUNTER — Encounter (HOSPITAL_COMMUNITY): Payer: Self-pay | Admitting: Psychiatry

## 2016-10-06 VITALS — BP 162/74 | HR 90 | Ht 66.0 in | Wt 179.0 lb

## 2016-10-06 DIAGNOSIS — Z91410 Personal history of adult physical and sexual abuse: Secondary | ICD-10-CM | POA: Diagnosis not present

## 2016-10-06 DIAGNOSIS — F17210 Nicotine dependence, cigarettes, uncomplicated: Secondary | ICD-10-CM

## 2016-10-06 DIAGNOSIS — F3110 Bipolar disorder, current episode manic without psychotic features, unspecified: Secondary | ICD-10-CM | POA: Diagnosis not present

## 2016-10-06 DIAGNOSIS — R4586 Emotional lability: Secondary | ICD-10-CM | POA: Diagnosis not present

## 2016-10-06 DIAGNOSIS — R5383 Other fatigue: Secondary | ICD-10-CM | POA: Diagnosis not present

## 2016-10-06 DIAGNOSIS — R454 Irritability and anger: Secondary | ICD-10-CM | POA: Diagnosis not present

## 2016-10-06 DIAGNOSIS — Z818 Family history of other mental and behavioral disorders: Secondary | ICD-10-CM | POA: Diagnosis not present

## 2016-10-06 DIAGNOSIS — K219 Gastro-esophageal reflux disease without esophagitis: Secondary | ICD-10-CM | POA: Diagnosis not present

## 2016-10-06 DIAGNOSIS — R450 Nervousness: Secondary | ICD-10-CM | POA: Diagnosis not present

## 2016-10-06 DIAGNOSIS — G4700 Insomnia, unspecified: Secondary | ICD-10-CM

## 2016-10-06 DIAGNOSIS — N951 Menopausal and female climacteric states: Secondary | ICD-10-CM | POA: Diagnosis not present

## 2016-10-06 DIAGNOSIS — R0789 Other chest pain: Secondary | ICD-10-CM | POA: Diagnosis not present

## 2016-10-06 DIAGNOSIS — F419 Anxiety disorder, unspecified: Secondary | ICD-10-CM | POA: Diagnosis not present

## 2016-10-06 DIAGNOSIS — R300 Dysuria: Secondary | ICD-10-CM | POA: Diagnosis not present

## 2016-10-06 DIAGNOSIS — Z91411 Personal history of adult psychological abuse: Secondary | ICD-10-CM

## 2016-10-06 DIAGNOSIS — E782 Mixed hyperlipidemia: Secondary | ICD-10-CM | POA: Diagnosis not present

## 2016-10-06 MED ORDER — LITHIUM CARBONATE 300 MG PO TABS: ORAL_TABLET | ORAL | 2 refills | Status: DC
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
## 2016-10-06 NOTE — Patient Instructions (Signed)
Check lithium level in about 10 days

## 2016-10-12 DIAGNOSIS — R51 Headache: Secondary | ICD-10-CM | POA: Diagnosis not present

## 2016-10-12 DIAGNOSIS — R918 Other nonspecific abnormal finding of lung field: Secondary | ICD-10-CM | POA: Diagnosis not present

## 2016-10-12 DIAGNOSIS — I700 Atherosclerosis of aorta: Secondary | ICD-10-CM | POA: Diagnosis not present

## 2016-10-12 DIAGNOSIS — J209 Acute bronchitis, unspecified: Secondary | ICD-10-CM | POA: Diagnosis not present

## 2016-10-12 DIAGNOSIS — E782 Mixed hyperlipidemia: Secondary | ICD-10-CM | POA: Diagnosis not present

## 2016-10-14 ENCOUNTER — Telehealth (HOSPITAL_COMMUNITY): Payer: Self-pay | Admitting: *Deleted

## 2016-10-14 NOTE — Telephone Encounter (Signed)
Called pt due to previous phone call. Pe rpt she have pneumonia and do not feel well and don't know if she should go get her Lithium levels checked. Per pt she's just have not been feeling well with all of that and her other doctor telling her that they found something in her lungs. Staff suggested that pt to please try to go to the lab to get her levels checked due to it being the instructions from the provider and being on Lithium. Per pt, if she feels well enough tomorrow morning, she will go to the lab to get it done but will update staff on what's going on tomorrow.

## 2016-10-14 NOTE — Telephone Encounter (Signed)
Noted. Would advise her to check level sooner if she experiences tremors, confusion, dizziness, difficulty with gait or dysarthria. Also advise her to take enough water (as dehydration can cause increase in lithium level).

## 2016-10-14 NOTE — Telephone Encounter (Signed)
voice message from patient for Andrea Dickson to call her.

## 2016-10-16 LAB — LITHIUM LEVEL: Lithium Lvl: 0.6 mmol/L (ref 0.6–1.2)

## 2016-10-19 NOTE — Telephone Encounter (Signed)
Spoke with pt and she stated she got her lithium lab done on 10-15-2016.

## 2016-10-19 NOTE — Telephone Encounter (Signed)
Please call her to get it checked this week

## 2016-10-27 ENCOUNTER — Ambulatory Visit (INDEPENDENT_AMBULATORY_CARE_PROVIDER_SITE_OTHER): Payer: Medicare Other | Admitting: Licensed Clinical Social Worker

## 2016-10-27 DIAGNOSIS — F3110 Bipolar disorder, current episode manic without psychotic features, unspecified: Secondary | ICD-10-CM | POA: Diagnosis not present
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
## 2016-11-02 ENCOUNTER — Encounter (HOSPITAL_COMMUNITY): Payer: Self-pay | Admitting: Psychiatry

## 2016-11-02 ENCOUNTER — Ambulatory Visit (INDEPENDENT_AMBULATORY_CARE_PROVIDER_SITE_OTHER): Payer: Medicare Other | Admitting: Psychiatry

## 2016-11-02 VITALS — BP 138/80 | HR 84 | Ht 66.0 in | Wt 171.0 lb

## 2016-11-02 DIAGNOSIS — Z79899 Other long term (current) drug therapy: Secondary | ICD-10-CM | POA: Diagnosis not present

## 2016-11-02 DIAGNOSIS — G252 Other specified forms of tremor: Secondary | ICD-10-CM | POA: Diagnosis not present

## 2016-11-02 DIAGNOSIS — G4700 Insomnia, unspecified: Secondary | ICD-10-CM

## 2016-11-02 DIAGNOSIS — F3110 Bipolar disorder, current episode manic without psychotic features, unspecified: Secondary | ICD-10-CM

## 2016-11-02 DIAGNOSIS — Z91410 Personal history of adult physical and sexual abuse: Secondary | ICD-10-CM | POA: Diagnosis not present

## 2016-11-02 DIAGNOSIS — Z91411 Personal history of adult psychological abuse: Secondary | ICD-10-CM | POA: Diagnosis not present

## 2016-11-02 DIAGNOSIS — F17210 Nicotine dependence, cigarettes, uncomplicated: Secondary | ICD-10-CM

## 2016-11-02 MED ORDER — BUPROPION HCL ER (XL) 150 MG PO TB24: 150.0000 mg | ORAL_TABLET | ORAL | 2 refills | Status: DC

## 2016-11-02 MED ORDER — LITHIUM CARBONATE 300 MG PO TABS: 600.0000 mg | ORAL_TABLET | Freq: Every day | ORAL | 2 refills | Status: DC
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
## 2016-11-17 DIAGNOSIS — I700 Atherosclerosis of aorta: Secondary | ICD-10-CM | POA: Diagnosis not present

## 2016-11-17 DIAGNOSIS — R918 Other nonspecific abnormal finding of lung field: Secondary | ICD-10-CM | POA: Diagnosis not present

## 2016-11-17 DIAGNOSIS — I2510 Atherosclerotic heart disease of native coronary artery without angina pectoris: Secondary | ICD-10-CM | POA: Diagnosis not present

## 2016-11-18 ENCOUNTER — Ambulatory Visit (INDEPENDENT_AMBULATORY_CARE_PROVIDER_SITE_OTHER): Payer: Medicare Other | Admitting: Licensed Clinical Social Worker

## 2016-11-18 ENCOUNTER — Encounter (HOSPITAL_COMMUNITY): Payer: Self-pay | Admitting: Licensed Clinical Social Worker

## 2016-11-18 DIAGNOSIS — F3110 Bipolar disorder, current episode manic without psychotic features, unspecified: Secondary | ICD-10-CM
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
## 2016-12-08 DIAGNOSIS — K219 Gastro-esophageal reflux disease without esophagitis: Secondary | ICD-10-CM | POA: Diagnosis not present

## 2016-12-08 DIAGNOSIS — R5383 Other fatigue: Secondary | ICD-10-CM | POA: Diagnosis not present

## 2016-12-08 DIAGNOSIS — E782 Mixed hyperlipidemia: Secondary | ICD-10-CM | POA: Diagnosis not present

## 2016-12-11 DIAGNOSIS — R51 Headache: Secondary | ICD-10-CM | POA: Diagnosis not present

## 2016-12-11 DIAGNOSIS — N951 Menopausal and female climacteric states: Secondary | ICD-10-CM | POA: Diagnosis not present

## 2016-12-11 DIAGNOSIS — K219 Gastro-esophageal reflux disease without esophagitis: Secondary | ICD-10-CM | POA: Diagnosis not present

## 2016-12-11 DIAGNOSIS — Z23 Encounter for immunization: Secondary | ICD-10-CM | POA: Diagnosis not present

## 2016-12-11 DIAGNOSIS — G44219 Episodic tension-type headache, not intractable: Secondary | ICD-10-CM | POA: Diagnosis not present

## 2016-12-11 DIAGNOSIS — E782 Mixed hyperlipidemia: Secondary | ICD-10-CM | POA: Diagnosis not present

## 2016-12-14 ENCOUNTER — Ambulatory Visit (INDEPENDENT_AMBULATORY_CARE_PROVIDER_SITE_OTHER): Payer: Medicare Other | Admitting: Psychiatry

## 2016-12-14 ENCOUNTER — Encounter (HOSPITAL_COMMUNITY): Payer: Self-pay | Admitting: Psychiatry

## 2016-12-14 VITALS — BP 140/78 | HR 84 | Ht 66.0 in | Wt 173.0 lb

## 2016-12-14 DIAGNOSIS — F3110 Bipolar disorder, current episode manic without psychotic features, unspecified: Secondary | ICD-10-CM

## 2016-12-14 DIAGNOSIS — Z818 Family history of other mental and behavioral disorders: Secondary | ICD-10-CM

## 2016-12-14 DIAGNOSIS — Z91411 Personal history of adult psychological abuse: Secondary | ICD-10-CM | POA: Diagnosis not present

## 2016-12-14 DIAGNOSIS — R5383 Other fatigue: Secondary | ICD-10-CM | POA: Diagnosis not present

## 2016-12-14 DIAGNOSIS — Z91410 Personal history of adult physical and sexual abuse: Secondary | ICD-10-CM

## 2016-12-14 DIAGNOSIS — R251 Tremor, unspecified: Secondary | ICD-10-CM

## 2016-12-14 DIAGNOSIS — F419 Anxiety disorder, unspecified: Secondary | ICD-10-CM

## 2016-12-14 DIAGNOSIS — R5381 Other malaise: Secondary | ICD-10-CM | POA: Diagnosis not present

## 2016-12-14 DIAGNOSIS — F17210 Nicotine dependence, cigarettes, uncomplicated: Secondary | ICD-10-CM | POA: Diagnosis not present

## 2016-12-14 MED ORDER — PROPRANOLOL HCL ER 60 MG PO CP24: 60.0000 mg | ORAL_CAPSULE | Freq: Every day | ORAL | 11 refills | Status: DC

## 2016-12-14 MED ORDER — DIAZEPAM 10 MG PO TABS: 10.0000 mg | ORAL_TABLET | Freq: Three times a day (TID) | ORAL | 2 refills | Status: DC

## 2016-12-14 MED ORDER — BUPROPION HCL ER (XL) 150 MG PO TB24: 150.0000 mg | ORAL_TABLET | ORAL | 2 refills | Status: DC

## 2016-12-14 MED ORDER — LITHIUM CARBONATE 300 MG PO TABS: 600.0000 mg | ORAL_TABLET | Freq: Every day | ORAL | 2 refills | Status: DC
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
## 2016-12-15 ENCOUNTER — Ambulatory Visit (HOSPITAL_COMMUNITY): Payer: Self-pay | Admitting: Licensed Clinical Social Worker

## 2016-12-18 ENCOUNTER — Encounter (HOSPITAL_COMMUNITY): Payer: Self-pay | Admitting: Licensed Clinical Social Worker

## 2016-12-18 ENCOUNTER — Ambulatory Visit (INDEPENDENT_AMBULATORY_CARE_PROVIDER_SITE_OTHER): Payer: Medicare Other | Admitting: Licensed Clinical Social Worker

## 2016-12-18 DIAGNOSIS — F3110 Bipolar disorder, current episode manic without psychotic features, unspecified: Secondary | ICD-10-CM

## 2016-12-18 NOTE — Progress Notes (Signed)
   THERAPIST PROGRESS NOTE  Session Time: 11:00 am-11:50 am  Participation Level: Active  Behavioral Response: CasualAlertIrritable  Type of Therapy: Individual Therapy  Treatment Goals addressed: Coping  Interventions: CBT and Solution Focused  Summary: Andrea Dickson is a 66 y.o. female who presents oriented x5 (person, place, situation, time, and object), alert, average height, average weight, casually dressed, appropriately groomed, and cooperative to address mood. Patient has minimal history of medical treatment. Patient has a history of mental health treatment that includes outpatient therapy, medication management, and hospitalization. Patient admits to symptoms of mania including racing thoughts, lack of sleep but increased energy, and irritability. Patient admits to passive thoughts of suicide and passive homicidal thoughts toward her ex husband. Patient denies plans or means. Patient denies psychosis but admits to seeing "black tracers" that go by her quickly at times. Patient denies substance abuse. She is at low risk for lethality at this time.   Patient had an average score of 6.75 out of 10 on the Outcome Rating Scale. Patient reported that her mood and anxiety have improved. Patient explained that she has been getting out of the house with her mother, sister and by herself. Patient reported that she feels like she is doing better. Patient rated the session 10 out of 10 on the Session Rating Scale.   Patient engaged in session. She responded well to interventions. Patient continues to meet criteria for Bipolar I disorder, most recent episode (or current) manic. Patient will continue in outpatient therapy due to being the least restrictive service to meet her needs at this time. Patient made  Moderate progress on her goals.   Suicidal/Homicidal: Negativewithout intent/plan  Therapist Response: Therapist reviewed patient's recent thoughts and behaviors. Therapist utilized CBT  to address mood. Therapist processed patient's feelings to identify triggers for mood. Therapist discussed patient's mood, anxiety and how she has been able to get out of the home.  Therapist adminstered the Outcome Rating Scale and the Session Rating Scale.   Plan: Return again in 2-3 weeks. Therapist will review patient goals on or before 01.16.2018  Diagnosis: Axis I: Bipolar, Manic    Axis II: No diagnosis    Glori Bickers, LCSW 12/18/2016

## 2017-01-22 ENCOUNTER — Ambulatory Visit (INDEPENDENT_AMBULATORY_CARE_PROVIDER_SITE_OTHER): Payer: Medicare Other | Admitting: Licensed Clinical Social Worker

## 2017-01-22 ENCOUNTER — Encounter (HOSPITAL_COMMUNITY): Payer: Self-pay | Admitting: Licensed Clinical Social Worker

## 2017-01-22 DIAGNOSIS — F3110 Bipolar disorder, current episode manic without psychotic features, unspecified: Secondary | ICD-10-CM | POA: Diagnosis not present
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
## 2017-02-15 ENCOUNTER — Ambulatory Visit (HOSPITAL_COMMUNITY): Payer: Self-pay | Admitting: Psychiatry

## 2017-02-16 ENCOUNTER — Encounter (HOSPITAL_COMMUNITY): Payer: Self-pay | Admitting: Psychiatry

## 2017-02-16 ENCOUNTER — Ambulatory Visit (INDEPENDENT_AMBULATORY_CARE_PROVIDER_SITE_OTHER): Payer: Medicare Other | Admitting: Psychiatry

## 2017-02-16 VITALS — BP 144/86 | HR 101 | Ht 66.0 in | Wt 173.0 lb

## 2017-02-16 DIAGNOSIS — F17210 Nicotine dependence, cigarettes, uncomplicated: Secondary | ICD-10-CM | POA: Diagnosis not present

## 2017-02-16 DIAGNOSIS — R251 Tremor, unspecified: Secondary | ICD-10-CM | POA: Diagnosis not present

## 2017-02-16 DIAGNOSIS — Z818 Family history of other mental and behavioral disorders: Secondary | ICD-10-CM

## 2017-02-16 DIAGNOSIS — Z79899 Other long term (current) drug therapy: Secondary | ICD-10-CM | POA: Diagnosis not present

## 2017-02-16 DIAGNOSIS — M791 Myalgia, unspecified site: Secondary | ICD-10-CM | POA: Diagnosis not present

## 2017-02-16 DIAGNOSIS — Z91411 Personal history of adult psychological abuse: Secondary | ICD-10-CM | POA: Diagnosis not present

## 2017-02-16 DIAGNOSIS — Z91410 Personal history of adult physical and sexual abuse: Secondary | ICD-10-CM | POA: Diagnosis not present

## 2017-02-16 DIAGNOSIS — F310 Bipolar disorder, current episode hypomanic: Secondary | ICD-10-CM | POA: Diagnosis not present

## 2017-02-16 MED ORDER — DIAZEPAM 10 MG PO TABS: 10.0000 mg | ORAL_TABLET | Freq: Three times a day (TID) | ORAL | 2 refills | Status: DC

## 2017-02-16 MED ORDER — LITHIUM CARBONATE 300 MG PO TABS: 300.0000 mg | ORAL_TABLET | Freq: Two times a day (BID) | ORAL | 2 refills | Status: DC

## 2017-02-16 MED ORDER — BUPROPION HCL ER (XL) 150 MG PO TB24: 150.0000 mg | ORAL_TABLET | ORAL | 2 refills | Status: DC
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
## 2017-02-20 DIAGNOSIS — R300 Dysuria: Secondary | ICD-10-CM | POA: Diagnosis not present

## 2017-02-22 ENCOUNTER — Encounter (HOSPITAL_COMMUNITY): Payer: Self-pay | Admitting: Licensed Clinical Social Worker

## 2017-02-22 ENCOUNTER — Ambulatory Visit (INDEPENDENT_AMBULATORY_CARE_PROVIDER_SITE_OTHER): Payer: Medicare Other | Admitting: Licensed Clinical Social Worker

## 2017-02-22 DIAGNOSIS — F310 Bipolar disorder, current episode hypomanic: Secondary | ICD-10-CM | POA: Diagnosis not present

## 2017-02-22 NOTE — Progress Notes (Signed)
   THERAPIST PROGRESS NOTE  Session Time: 1:00 pm-1:45 pm  Participation Level: Active  Behavioral Response: CasualAlertIrritable  Type of Therapy: Individual Therapy  Treatment Goals addressed: Coping  Interventions: CBT and Solution Focused  Summary: Andrea Dickson is a 67 y.o. female who presents oriented x5 (person, place, situation, time, and object), alert, average height, average weight, casually dressed, appropriately groomed, and cooperative to address mood. Patient has minimal history of medical treatment. Patient has a history of mental health treatment that includes outpatient therapy, medication management, and hospitalization. Patient admits to symptoms of mania including racing thoughts, lack of sleep but increased energy, and irritability. Patient admits to passive thoughts of suicide and passive homicidal thoughts toward her ex husband. Patient denies plans or means. Patient denies psychosis but admits to seeing "black tracers" that go by her quickly at times. Patient denies substance abuse. She is at low risk for lethality at this time.   Physically: Patient has been sick. She has had a bladder infection and has had aches in her legs. Patient continues to have headaches from time to time.  Spiritually/values: No issues identified.  Relationships: Patient continues to talk to her family (stepmother, sisters). She spends time with her ex husband including going out to dinner.  Emotional/Mental/Behavior: Patient noted that she has been able to go out and is attempting to build up her distress tolerance. Patient went to [facility] on a Saturday night and it was very busy. Patient was able to be in that environment for over an hour and a half and have a good time before she felt like she had to leave. Patient agreed that this is improvement from before. Patient plans to continue go out and build up her tolerance.   Patient engaged in session. She responded well to  interventions. Patient continues to meet criteria for Bipolar I disorder, most recent episode (or current) manic. Patient will continue in outpatient therapy due to being the least restrictive service to meet her needs at this time. Patient made moderate progress on her goals.   Suicidal/Homicidal: Negativewithout intent/plan  Therapist Response: Therapist reviewed patient's recent thoughts and behaviors. Therapist utilized CBT to address mood. Therapist processed patient's feelings to identify triggers for mood. Therapist discussed distress tolerance and how patient can continue to build up her tolerance.   Plan: Return again in 2-3 weeks. Therapist will review patient goals on or before 01.16.2018  Diagnosis: Axis I: Bipolar, Manic    Axis II: No diagnosis    Glori Bickers, LCSW 02/22/2017

## 2017-02-23 LAB — LITHIUM LEVEL: Lithium Lvl: 0.6 mmol/L (ref 0.6–1.2)

## 2017-03-08 DIAGNOSIS — G252 Other specified forms of tremor: Secondary | ICD-10-CM | POA: Diagnosis not present

## 2017-03-08 DIAGNOSIS — G44219 Episodic tension-type headache, not intractable: Secondary | ICD-10-CM | POA: Diagnosis not present

## 2017-03-15 DIAGNOSIS — R9089 Other abnormal findings on diagnostic imaging of central nervous system: Secondary | ICD-10-CM | POA: Diagnosis not present

## 2017-03-15 DIAGNOSIS — G252 Other specified forms of tremor: Secondary | ICD-10-CM | POA: Diagnosis not present

## 2017-03-15 DIAGNOSIS — R251 Tremor, unspecified: Secondary | ICD-10-CM | POA: Diagnosis not present

## 2017-03-15 DIAGNOSIS — I6782 Cerebral ischemia: Secondary | ICD-10-CM | POA: Diagnosis not present

## 2017-03-15 DIAGNOSIS — G44219 Episodic tension-type headache, not intractable: Secondary | ICD-10-CM | POA: Diagnosis not present

## 2017-03-23 ENCOUNTER — Encounter (HOSPITAL_COMMUNITY): Payer: Self-pay | Admitting: Licensed Clinical Social Worker

## 2017-03-23 ENCOUNTER — Ambulatory Visit (INDEPENDENT_AMBULATORY_CARE_PROVIDER_SITE_OTHER): Payer: Medicare Other | Admitting: Licensed Clinical Social Worker

## 2017-03-23 DIAGNOSIS — F3110 Bipolar disorder, current episode manic without psychotic features, unspecified: Secondary | ICD-10-CM | POA: Diagnosis not present
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
## 2017-03-30 ENCOUNTER — Encounter (HOSPITAL_COMMUNITY): Payer: Self-pay | Admitting: Psychiatry

## 2017-03-30 ENCOUNTER — Ambulatory Visit (INDEPENDENT_AMBULATORY_CARE_PROVIDER_SITE_OTHER): Payer: Medicare Other | Admitting: Psychiatry

## 2017-03-30 VITALS — BP 133/80 | HR 90 | Ht 66.0 in | Wt 168.0 lb

## 2017-03-30 DIAGNOSIS — F3110 Bipolar disorder, current episode manic without psychotic features, unspecified: Secondary | ICD-10-CM

## 2017-03-30 DIAGNOSIS — R251 Tremor, unspecified: Secondary | ICD-10-CM

## 2017-03-30 DIAGNOSIS — Z91411 Personal history of adult psychological abuse: Secondary | ICD-10-CM | POA: Diagnosis not present

## 2017-03-30 DIAGNOSIS — Z818 Family history of other mental and behavioral disorders: Secondary | ICD-10-CM

## 2017-03-30 DIAGNOSIS — F17210 Nicotine dependence, cigarettes, uncomplicated: Secondary | ICD-10-CM

## 2017-03-30 DIAGNOSIS — Z91410 Personal history of adult physical and sexual abuse: Secondary | ICD-10-CM

## 2017-03-30 MED ORDER — BUPROPION HCL ER (XL) 300 MG PO TB24: 300.0000 mg | ORAL_TABLET | ORAL | 2 refills | Status: DC

## 2017-03-30 MED ORDER — LITHIUM CARBONATE 300 MG PO TABS: 300.0000 mg | ORAL_TABLET | Freq: Two times a day (BID) | ORAL | 2 refills | Status: DC

## 2017-03-30 MED ORDER — DIAZEPAM 10 MG PO TABS: 10.0000 mg | ORAL_TABLET | Freq: Three times a day (TID) | ORAL | 2 refills | Status: DC
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
## 2017-04-27 ENCOUNTER — Encounter (HOSPITAL_COMMUNITY): Payer: Self-pay | Admitting: Licensed Clinical Social Worker

## 2017-04-27 ENCOUNTER — Ambulatory Visit (INDEPENDENT_AMBULATORY_CARE_PROVIDER_SITE_OTHER): Payer: Medicare Other | Admitting: Licensed Clinical Social Worker

## 2017-04-27 DIAGNOSIS — F310 Bipolar disorder, current episode hypomanic: Secondary | ICD-10-CM | POA: Diagnosis not present

## 2017-04-27 NOTE — Progress Notes (Signed)
   THERAPIST PROGRESS NOTE  Session Time: 1:00 pm-1:45 pm  Participation Level: Active  Behavioral Response: CasualAlertIrritable  Type of Therapy: Individual Therapy  Treatment Goals addressed: Coping  Interventions: CBT and Solution Focused  Summary: Andrea Dickson is a 67 y.o. female who presents oriented x5 (person, place, situation, time, and object), alert, average height, average weight, casually dressed, appropriately groomed, and cooperative to address mood. Patient has minimal history of medical treatment. Patient has a history of mental health treatment that includes outpatient therapy, medication management, and hospitalization. Patient admits to symptoms of mania including racing thoughts, lack of sleep but increased energy, and irritability. Patient admits to passive thoughts of suicide and passive homicidal thoughts toward her ex husband. Patient denies plans or means. Patient denies psychosis but admits to seeing "black tracers" that go by her quickly at times. Patient denies substance abuse. She is at low risk for lethality at this time.   Physically: Patient's tremors have increased. She has difficulty drinking out of a cup or mug, putting on makeup and brushing her teeth. She is going to get a brain scan to rule out anything medically.  Spiritually/values: No issues identified.  Relationships: Patient's relationships continue to go well. She is seeing her father and stepmother daily.  Emotional/Mental/Behavior: Patient noted her mood has been stable. She is worried that her tremors are a result of parkinson's or MS. After discussion, patient stated that no matter the result of the scan, she is going to do the best that she can.   Patient engaged in session. She responded well to interventions. Patient continues to meet criteria for Bipolar I disorder, most recent episode (or current) manic. Patient will continue in outpatient therapy due to being the least restrictive  service to meet her needs at this time. Patient made moderate progress on her goals.   Suicidal/Homicidal: Negativewithout intent/plan  Therapist Response: Therapist reviewed patient's recent thoughts and behaviors. Therapist utilized CBT to address mood. Therapist processed patient's feelings to identify triggers for mood. Therapist discussed patient's physical health and how it impacts her mood.    Plan: Return again in 2-3 weeks. Therapist will review patient goals on or before 01.16.2018  Diagnosis: Axis I: Bipolar, Manic    Axis II: No diagnosis    Glori Bickers, LCSW 04/27/2017

## 2017-05-14 ENCOUNTER — Encounter: Payer: Self-pay | Admitting: Diagnostic Neuroimaging

## 2017-05-14 ENCOUNTER — Ambulatory Visit (INDEPENDENT_AMBULATORY_CARE_PROVIDER_SITE_OTHER): Payer: Medicare Other | Admitting: Diagnostic Neuroimaging

## 2017-05-14 VITALS — BP 120/76 | HR 78 | Ht 66.0 in | Wt 166.4 lb

## 2017-05-14 DIAGNOSIS — G250 Essential tremor: Secondary | ICD-10-CM | POA: Diagnosis not present

## 2017-05-14 MED ORDER — PRIMIDONE 50 MG PO TABS: 25.0000 mg | ORAL_TABLET | Freq: Two times a day (BID) | ORAL | 12 refills | Status: DC

## 2017-05-14 NOTE — Patient Instructions (Signed)
-   start primidone 25mg  daily --> then increase up to 25mg  twice a day, then up to 50mg  twice a day; wait 1 week before each change

## 2017-05-14 NOTE — Progress Notes (Signed)
[facility]  PATIENT: Andrea Dickson DOB: May 25, 1950  REFERRING CLINICIAN: S Burdine  HISTORY FROM: patient  REASON FOR VISIT: new consult    HISTORICAL  CHIEF COMPLAINT:  Chief Complaint  Patient presents with  . NX  Dr. Pleas Koch  Tremors    Seen in 2016 at [facility] (Dr. Leta Baptist) for migraines.  Has had tremors for years and the last 6 months has gotten worse.  Has tried propranolol twice by pcp and psychologist and no improvement.      HISTORY OF PRESENT ILLNESS:   67 year old female with bipolar disorder, hypercholesteremia, here for evaluation of tremors.  For past 5 years patient has had onset of tremors in her hands, mainly with action and posture.  Handwriting is affected her handwriting, eating, ability to hold things.  She has some head tremor in her head and neck.  No resting tremor.  Patient also has fatigue, palpitations, memory loss, confusion, insomnia, weakness, dizziness, depression, anxiety, racing thoughts.  REVIEW OF SYSTEMS: Full 14 system review of systems performed and negative with exception of: As per HPI.  ALLERGIES: Allergies  Allergen Reactions  . Pramipexole Other (See Comments)    Shaking, palpitations, headache, faint feeling  . Propranolol     Not effective for tremors 2019  . Pollen Extract Other (See Comments)    Eyes and nose run    HOME MEDICATIONS: Outpatient Medications Prior to Visit  Medication Sig Dispense Refill  . atorvastatin (LIPITOR) 20 MG tablet TAKE 1 Tablet BY MOUTH EVERY NIGHT AT BEDTIME 90 tablet 1  . buPROPion (WELLBUTRIN XL) 300 MG 24 hr tablet Take 1 tablet (300 mg total) by mouth every morning. 30 tablet 2  . cetirizine (ZYRTEC) 10 MG tablet Take 10 mg by mouth daily.    . diazepam (VALIUM) 10 MG tablet Take 1 tablet (10 mg total) by mouth 3 (three) times daily. (Patient taking differently: Take 10 mg by mouth 3 (three) times daily. Takes 1 tablet at bedtime and prn during day.) 90 tablet 2  .  estrogens, conjugated, (PREMARIN) 1.25 MG tablet Take 1.25 mg by mouth daily. Take one tablet for 21 days and then do not take for 7 days.    Marland Kitchen lithium 300 MG tablet Take 1 tablet (300 mg total) by mouth 2 (two) times daily. 60 tablet 2  . naproxen sodium (ALEVE) 220 MG tablet Take 220 mg by mouth daily as needed.    Marland Kitchen omeprazole (PRILOSEC) 20 MG capsule TAKE 2 Capsules BY MOUTH ONCE DAILY 180 capsule 1  . albuterol (PROVENTIL HFA;VENTOLIN HFA) 108 (90 Base) MCG/ACT inhaler Inhale 2 puffs into the lungs every 6 (six) hours as needed for wheezing or shortness of breath. (Patient not taking: Reported on 05/14/2017) 1 Inhaler 2  . omega-3 acid ethyl esters (LOVAZA) 1 g capsule Take 1 g by mouth daily.    Marland Kitchen PREMARIN 0.9 MG tablet TAKE 1 Tablet BY MOUTH ONCE DAILY FOR 21 DAYS, THEN DO NOT TAKE FOR 7 DAYS (Patient not taking: Reported on 05/14/2017) 70 tablet 1   No facility-administered medications prior to visit.     PAST MEDICAL HISTORY: Past Medical History:  Diagnosis Date  . Anxiety   . Bipolar 1 disorder ([place])   . Depression   . Headache   . High cholesterol     PAST SURGICAL HISTORY: Past Surgical History:  Procedure Laterality Date  . ABDOMINAL HYSTERECTOMY  1997  . carpal tunnel Bilateral 1999, 06/2009  . SKIN CANCER EXCISION  2007  . TONSILLECTOMY  1965  . ulner nerve   06/2009   decompression    FAMILY HISTORY: Family History  Problem Relation Age of Onset  . Bipolar disorder Brother   . Diabetes Brother   . Heart disease Brother   . Diabetes Father   . Heart attack Father   . Heart disease Father   . Diabetes Sister   . Diabetes Mother   . Brain cancer Mother     SOCIAL HISTORY:  Social History   Socioeconomic History  . Marital status: Unknown    Spouse name: Not on file  . Number of children: 1  . Years of education: 61  . Highest education level: Not on file  Occupational History    Comment: employed at [employer]  . Financial  resource strain: Not on file  . Food insecurity:    Worry: Not on file    Inability: Not on file  . Transportation needs:    Medical: Not on file    Non-medical: Not on file  Tobacco Use  . Smoking status: Current Every Day Smoker    Packs/day: 1.00    Years: 43.00    Pack years: 43.00    Types: Cigarettes  . Smokeless tobacco: Never Used  . Tobacco comment: smoking since 67yrs old.    Substance and Sexual Activity  . Alcohol use: No    Alcohol/week: 0.0 oz  . Drug use: No  . Sexual activity: Not Currently  Lifestyle  . Physical activity:    Days per week: Not on file    Minutes per session: Not on file  . Stress: Not on file  Relationships  . Social connections:    Talks on phone: Not on file    Gets together: Not on file    Attends religious service: Not on file    Active member of club or organization: Not on file    Attends meetings of clubs or organizations: Not on file    Relationship status: Not on file  . Intimate partner violence:    Fear of current or ex partner: Not on file    Emotionally abused: Not on file    Physically abused: Not on file    Forced sexual activity: Not on file  Other Topics Concern  . Not on file  Social History Narrative   Lives alone.  Retired.  Education 12th grade.  One child.     Caffeine use- sodas, 2 daily     PHYSICAL EXAM  GENERAL EXAM/CONSTITUTIONAL: Vitals:  Vitals:   05/14/17 1103  BP: 120/76  Pulse: 78  Weight: 166 lb 6.4 oz (75.5 kg)  Height: 5\' 6"  (1.676 m)     Body mass index is 26.86 kg/m.  Visual Acuity Screening   Right eye Left eye Both eyes  Without correction:     With correction: 20/50 20/40      Patient is in no distress; well developed, nourished and groomed; neck is supple  CARDIOVASCULAR:  Examination of carotid arteries is normal; no carotid bruits  Regular rate and rhythm, no murmurs  Examination of peripheral vascular system by observation and palpation is  normal  EYES:  Ophthalmoscopic exam of optic discs and posterior segments is normal; no papilledema or hemorrhages  MUSCULOSKELETAL:  Gait, strength, tone, movements noted in Neurologic exam below  NEUROLOGIC: MENTAL STATUS:  No flowsheet data found.  awake, alert, oriented to person, place and time  recent and remote memory  intact  normal attention and concentration  language fluent, comprehension intact, naming intact,   fund of knowledge appropriate  CRANIAL NERVE:   2nd - no papilledema on fundoscopic exam  2nd, 3rd, 4th, 6th - pupils equal and reactive to light, visual fields full to confrontation, extraocular muscles intact, no nystagmus  5th - facial sensation symmetric  7th - facial strength symmetric  8th - hearing intact  9th - palate elevates symmetrically, uvula midline  11th - shoulder shrug symmetric  12th - tongue protrusion midline  MOTOR:   POSTURAL AND ACTION TREMOR IN BUE  MILD HEAD TREMOR  MILD BRADYKINESIA IN BUE --> WITH CONTRALATERAL RESTING TREMOR WITH RAM  NO RIGIDITY  normal bulk; full strength in the BUE, BLE  SENSORY:   normal and symmetric to light touch, temperature, vibration  COORDINATION:   finger-nose-finger, fine finger movements normal  REFLEXES:   deep tendon reflexes present and symmetric  GAIT/STATION:   narrow based gait; DECREASED LEFT ARM SWING    DIAGNOSTIC DATA (LABS, IMAGING, TESTING) - I reviewed patient records, labs, notes, testing and imaging myself where available.  Lab Results  Component Value Date   WBC 6.6 04/04/2015   HGB 14.8 04/04/2015   HCT 41.6 04/04/2015   MCV 89.5 04/04/2015   PLT 188 04/04/2015      Component Value Date/Time   NA 141 02/17/2016 1022   K 4.6 02/17/2016 1022   CL 106 02/17/2016 1022   CO2 28 02/17/2016 1022   GLUCOSE 115 (H) 02/17/2016 1022   BUN 9 02/17/2016 1022   CREATININE 0.70 02/17/2016 1022   CALCIUM 8.8 02/17/2016 1022   PROT 6.2 02/17/2016  1022   ALBUMIN 3.8 02/17/2016 1022   AST 41 (H) 02/17/2016 1022   ALT 26 02/17/2016 1022   ALKPHOS 110 02/17/2016 1022   BILITOT 0.5 02/17/2016 1022   GFRNONAA 79 11/18/2015 0911   GFRAA >89 11/18/2015 0911   Lab Results  Component Value Date   CHOL 178 02/17/2016   HDL 30 (L) 02/17/2016   LDLCALC 102 (H) 02/17/2016   TRIG 229 (H) 02/17/2016   CHOLHDL 5.9 (H) 02/17/2016   Lab Results  Component Value Date   HGBA1C 5.4 04/24/2015   No results found for: [id number] Lab Results  Component Value Date   TSH 2.50 04/24/2015    10/01/14 MRI brain - tiny non-specific T2 hyperintensities    ASSESSMENT AND PLAN  67 y.o. year old female here with action and postural tremor since 2014, likely essential tremor.  Patient is tried and failed propranolol, although patient does not know what dose and it may have been too low. Also with excessive caffeine intake as well as anxiety issues, both of which can aggravate tremor.   Dx:  1. Essential tremor      PLAN:  - trial of primidone 25mg  daily --> then increase up to 25mg  twice a day, then up to 50mg  twice a day - gradually reduce caffeine  Meds ordered this encounter  Medications  . primidone (MYSOLINE) 50 MG tablet    Sig: Take 0.5-1 tablets (25-50 mg total) by mouth 2 (two) times daily.    Dispense:  60 tablet    Refill:  12   Return in about 6 months (around 11/14/2017).    Penni Bombard, MD 07/12/6965, 89:38 AM Certified in Neurology, Neurophysiology and Neuroimaging  [facility] [address] [place], [place] [address] [phone]

## 2017-06-01 ENCOUNTER — Encounter (HOSPITAL_COMMUNITY): Payer: Self-pay | Admitting: Psychiatry

## 2017-06-01 ENCOUNTER — Ambulatory Visit (INDEPENDENT_AMBULATORY_CARE_PROVIDER_SITE_OTHER): Payer: Medicare Other | Admitting: Psychiatry

## 2017-06-01 VITALS — BP 124/79 | HR 82 | Ht 66.0 in | Wt 166.0 lb

## 2017-06-01 DIAGNOSIS — F310 Bipolar disorder, current episode hypomanic: Secondary | ICD-10-CM | POA: Diagnosis not present

## 2017-06-01 DIAGNOSIS — R251 Tremor, unspecified: Secondary | ICD-10-CM | POA: Diagnosis not present

## 2017-06-01 DIAGNOSIS — F17210 Nicotine dependence, cigarettes, uncomplicated: Secondary | ICD-10-CM

## 2017-06-01 DIAGNOSIS — Z818 Family history of other mental and behavioral disorders: Secondary | ICD-10-CM

## 2017-06-01 MED ORDER — DIAZEPAM 10 MG PO TABS: 10.0000 mg | ORAL_TABLET | Freq: Two times a day (BID) | ORAL | 2 refills | Status: DC

## 2017-06-01 MED ORDER — LITHIUM CARBONATE 300 MG PO TABS: 300.0000 mg | ORAL_TABLET | Freq: Two times a day (BID) | ORAL | 2 refills | Status: DC

## 2017-06-01 MED ORDER — BUPROPION HCL ER (XL) 300 MG PO TB24: 300.0000 mg | ORAL_TABLET | ORAL | 2 refills | Status: DC
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
## 2017-06-10 ENCOUNTER — Ambulatory Visit (INDEPENDENT_AMBULATORY_CARE_PROVIDER_SITE_OTHER): Payer: Medicare Other | Admitting: Licensed Clinical Social Worker

## 2017-06-10 ENCOUNTER — Encounter (HOSPITAL_COMMUNITY): Payer: Self-pay | Admitting: Licensed Clinical Social Worker

## 2017-06-10 DIAGNOSIS — E782 Mixed hyperlipidemia: Secondary | ICD-10-CM | POA: Diagnosis not present

## 2017-06-10 DIAGNOSIS — G252 Other specified forms of tremor: Secondary | ICD-10-CM | POA: Diagnosis not present

## 2017-06-10 DIAGNOSIS — R5383 Other fatigue: Secondary | ICD-10-CM | POA: Diagnosis not present

## 2017-06-10 DIAGNOSIS — K219 Gastro-esophageal reflux disease without esophagitis: Secondary | ICD-10-CM | POA: Diagnosis not present

## 2017-06-10 DIAGNOSIS — F310 Bipolar disorder, current episode hypomanic: Secondary | ICD-10-CM

## 2017-06-10 DIAGNOSIS — R739 Hyperglycemia, unspecified: Secondary | ICD-10-CM | POA: Diagnosis not present

## 2017-06-10 NOTE — Progress Notes (Signed)
   THERAPIST PROGRESS NOTE  Session Time: 1:00 pm-1:45 pm  Participation Level: Active  Behavioral Response: CasualAlertIrritable  Type of Therapy: Individual Therapy  Treatment Goals addressed: Coping  Interventions: CBT and Solution Focused  Summary: Andrea Dickson is a 67 y.o. female who presents oriented x5 (person, place, situation, time, and object), alert, average height, average weight, casually dressed, appropriately groomed, and cooperative to address mood. Patient has minimal history of medical treatment. Patient has a history of mental health treatment that includes outpatient therapy, medication management, and hospitalization. Patient admits to symptoms of mania including racing thoughts, lack of sleep but increased energy, and irritability. Patient admits to passive thoughts of suicide and passive homicidal thoughts toward her ex husband. Patient denies plans or means. Patient denies psychosis but admits to seeing "black tracers" that go by her quickly at times. Patient denies substance abuse. She is at low risk for lethality at this time.   Physically: Patient's found out that she has essential tremors. It is not parkinson's or MS related. She was given medication to assist in managing her tremors. She feels like her medications are where they need to be as well. She has not felt this well in a long time.  Spiritually/values: No issues identified.  Relationships: Patient's relationships are going well. She was able to attend her family members birthday party with no issues or feeling irritable/overwhelmed.  Emotional/Mental/Behavior: Patient noted her mood has been stable. She was served divorce papers. She has been separated for 7 years so it was not a big deal for her. She was frustrated that the name was wrong on the divorce papers but has moved on from it. Overall, patient reported that she is doing well.   Patient engaged in session. She responded well to interventions.  Patient continues to meet criteria for Bipolar I disorder, most recent episode (or current) manic. Patient will continue in outpatient therapy due to being the least restrictive service to meet her needs at this time. Patient made moderate progress on her goals.   Suicidal/Homicidal: Negativewithout intent/plan  Therapist Response: Therapist reviewed patient's recent thoughts and behaviors. Therapist utilized CBT to address mood. Therapist processed patient's feelings to identify triggers for mood. Therapist examined how patient has managed her mood and how her life will be different going forward due to the medical diagnosis and the divorce.   Plan: Return again in 2-3 weeks.   Diagnosis: Axis I: Bipolar, Manic    Axis II: No diagnosis    Glori Bickers, LCSW 06/10/2017

## 2017-06-14 DIAGNOSIS — E782 Mixed hyperlipidemia: Secondary | ICD-10-CM | POA: Diagnosis not present

## 2017-06-14 DIAGNOSIS — Z23 Encounter for immunization: Secondary | ICD-10-CM | POA: Diagnosis not present

## 2017-06-14 DIAGNOSIS — Z0001 Encounter for general adult medical examination with abnormal findings: Secondary | ICD-10-CM | POA: Diagnosis not present

## 2017-06-14 DIAGNOSIS — Z8249 Family history of ischemic heart disease and other diseases of the circulatory system: Secondary | ICD-10-CM | POA: Diagnosis not present

## 2017-06-21 DIAGNOSIS — H6122 Impacted cerumen, left ear: Secondary | ICD-10-CM | POA: Diagnosis not present

## 2017-06-21 DIAGNOSIS — H6502 Acute serous otitis media, left ear: Secondary | ICD-10-CM | POA: Diagnosis not present

## 2017-06-22 DIAGNOSIS — R52 Pain, unspecified: Secondary | ICD-10-CM | POA: Diagnosis not present

## 2017-06-22 DIAGNOSIS — B349 Viral infection, unspecified: Secondary | ICD-10-CM | POA: Diagnosis not present

## 2017-06-22 DIAGNOSIS — Z85828 Personal history of other malignant neoplasm of skin: Secondary | ICD-10-CM | POA: Diagnosis not present

## 2017-06-22 DIAGNOSIS — Z79899 Other long term (current) drug therapy: Secondary | ICD-10-CM | POA: Diagnosis not present

## 2017-06-22 DIAGNOSIS — M791 Myalgia, unspecified site: Secondary | ICD-10-CM | POA: Diagnosis not present

## 2017-06-22 DIAGNOSIS — K219 Gastro-esophageal reflux disease without esophagitis: Secondary | ICD-10-CM | POA: Diagnosis not present

## 2017-06-24 DIAGNOSIS — E782 Mixed hyperlipidemia: Secondary | ICD-10-CM | POA: Diagnosis not present

## 2017-06-24 DIAGNOSIS — M791 Myalgia, unspecified site: Secondary | ICD-10-CM | POA: Diagnosis not present

## 2017-06-24 DIAGNOSIS — R5383 Other fatigue: Secondary | ICD-10-CM | POA: Diagnosis not present

## 2017-06-28 ENCOUNTER — Encounter: Payer: Self-pay | Admitting: Gastroenterology

## 2017-06-30 DIAGNOSIS — H6502 Acute serous otitis media, left ear: Secondary | ICD-10-CM | POA: Diagnosis not present

## 2017-06-30 DIAGNOSIS — M791 Myalgia, unspecified site: Secondary | ICD-10-CM | POA: Diagnosis not present

## 2017-07-02 DIAGNOSIS — Z1231 Encounter for screening mammogram for malignant neoplasm of breast: Secondary | ICD-10-CM | POA: Diagnosis not present

## 2017-07-06 ENCOUNTER — Telehealth: Payer: Self-pay | Admitting: Diagnostic Neuroimaging

## 2017-07-06 NOTE — Telephone Encounter (Signed)
Patient states primidone (MYSOLINE) 50 MG tablet is not working. Can another medication be called in? Patient uses [facility] in [place]. Please call and discuss.

## 2017-07-06 NOTE — Telephone Encounter (Signed)
Spoke with patient and asked how she is taking Primidone. She stated that she worked up to taking 50 mg twice daily as Dr Leta Baptist instructed. She has not noticed any improvement in her hand tremors. She stated she is trying to cut back on caffeine and drink more water.  This RN stated will discuss with Dr Leta Baptist and call her back. She verbalized understanding, appreciation.

## 2017-07-07 NOTE — Telephone Encounter (Signed)
Spoke with patient and gave her Dr Gladstone Lighter instructions for increasing Primidone. She repeated instructions correctly. This RN asked her to call back in 2-3 weeks to update. Advised her if new dosing is helpful Dr Leta Baptist will send in new prescription. She verbalized understanding, appreciation.

## 2017-07-07 NOTE — Telephone Encounter (Signed)
May increase primidone to 50mg  / 100mg  x 2 weeks; then up to 100mg  twice a day.

## 2017-07-08 DIAGNOSIS — M810 Age-related osteoporosis without current pathological fracture: Secondary | ICD-10-CM | POA: Diagnosis not present

## 2017-07-22 ENCOUNTER — Encounter (HOSPITAL_COMMUNITY): Payer: Self-pay | Admitting: Licensed Clinical Social Worker

## 2017-07-22 ENCOUNTER — Ambulatory Visit (INDEPENDENT_AMBULATORY_CARE_PROVIDER_SITE_OTHER): Payer: Medicare Other | Admitting: Licensed Clinical Social Worker

## 2017-07-22 DIAGNOSIS — F310 Bipolar disorder, current episode hypomanic: Secondary | ICD-10-CM

## 2017-07-22 NOTE — Progress Notes (Signed)
   THERAPIST PROGRESS NOTE  Session Time: 3:00 pm-3:45 pm  Participation Level: Active  Behavioral Response: CasualAlertIrritable  Type of Therapy: Individual Therapy  Treatment Goals addressed: Coping  Interventions: CBT and Solution Focused  Summary: Andrea Dickson is a 67 y.o. female who presents oriented x5 (person, place, situation, time, and object), alert, average height, average weight, casually dressed, appropriately groomed, and cooperative to address mood. Patient has minimal history of medical treatment. Patient has a history of mental health treatment that includes outpatient therapy, medication management, and hospitalization. Patient admits to symptoms of mania including racing thoughts, lack of sleep but increased energy, and irritability. Patient admits to passive thoughts of suicide and passive homicidal thoughts toward her ex husband. Patient denies plans or means. Patient denies psychosis but admits to seeing "black tracers" that go by her quickly at times. Patient denies substance abuse. She is at low risk for lethality at this time.   Physically: Patient's tremors have increased.  Spiritually/values: No issues identified.  Relationships: Patient is now divorced. She is getting along well with others.  Emotional/Mental/Behavior: Patient is not experiencing any irritability. Her tremors have increased but she is managing it well.   Patient engaged in session. She responded well to interventions. Patient continues to meet criteria for Bipolar I disorder, most recent episode (or current) manic. Patient will continue in outpatient therapy due to being the least restrictive service to meet her needs at this time. Patient made moderate progress on her goals.   Suicidal/Homicidal: Negativewithout intent/plan  Therapist Response: Therapist reviewed patient's recent thoughts and behaviors. Therapist utilized CBT to address mood. Therapist processed patient's feelings to  identify triggers for mood. Therapist updated treatment plan.   Plan: Return again in 2 months.    Diagnosis: Axis I: Bipolar, Manic    Axis II: No diagnosis    Glori Bickers, LCSW 07/22/2017

## 2017-08-10 DIAGNOSIS — M545 Low back pain: Secondary | ICD-10-CM | POA: Diagnosis not present

## 2017-08-10 DIAGNOSIS — K219 Gastro-esophageal reflux disease without esophagitis: Secondary | ICD-10-CM | POA: Diagnosis not present

## 2017-08-10 DIAGNOSIS — M791 Myalgia, unspecified site: Secondary | ICD-10-CM | POA: Diagnosis not present

## 2017-08-17 DIAGNOSIS — M6281 Muscle weakness (generalized): Secondary | ICD-10-CM | POA: Diagnosis not present

## 2017-08-17 DIAGNOSIS — M545 Low back pain: Secondary | ICD-10-CM | POA: Diagnosis not present

## 2017-08-17 DIAGNOSIS — Z723 Lack of physical exercise: Secondary | ICD-10-CM | POA: Diagnosis not present

## 2017-08-17 DIAGNOSIS — M2560 Stiffness of unspecified joint, not elsewhere classified: Secondary | ICD-10-CM | POA: Diagnosis not present

## 2017-08-20 DIAGNOSIS — M545 Low back pain: Secondary | ICD-10-CM | POA: Diagnosis not present

## 2017-08-20 DIAGNOSIS — M2560 Stiffness of unspecified joint, not elsewhere classified: Secondary | ICD-10-CM | POA: Diagnosis not present

## 2017-08-20 DIAGNOSIS — Z723 Lack of physical exercise: Secondary | ICD-10-CM | POA: Diagnosis not present

## 2017-08-20 DIAGNOSIS — M6281 Muscle weakness (generalized): Secondary | ICD-10-CM | POA: Diagnosis not present

## 2017-08-24 DIAGNOSIS — M545 Low back pain: Secondary | ICD-10-CM | POA: Diagnosis not present

## 2017-08-24 DIAGNOSIS — M2560 Stiffness of unspecified joint, not elsewhere classified: Secondary | ICD-10-CM | POA: Diagnosis not present

## 2017-08-24 DIAGNOSIS — Z723 Lack of physical exercise: Secondary | ICD-10-CM | POA: Diagnosis not present

## 2017-08-24 DIAGNOSIS — M6281 Muscle weakness (generalized): Secondary | ICD-10-CM | POA: Diagnosis not present

## 2017-08-27 DIAGNOSIS — M6281 Muscle weakness (generalized): Secondary | ICD-10-CM | POA: Diagnosis not present

## 2017-08-27 DIAGNOSIS — M2560 Stiffness of unspecified joint, not elsewhere classified: Secondary | ICD-10-CM | POA: Diagnosis not present

## 2017-08-27 DIAGNOSIS — Z723 Lack of physical exercise: Secondary | ICD-10-CM | POA: Diagnosis not present

## 2017-08-27 DIAGNOSIS — M545 Low back pain: Secondary | ICD-10-CM | POA: Diagnosis not present

## 2017-09-01 ENCOUNTER — Telehealth: Payer: Self-pay | Admitting: Diagnostic Neuroimaging

## 2017-09-01 ENCOUNTER — Encounter (HOSPITAL_COMMUNITY): Payer: Self-pay | Admitting: Psychiatry

## 2017-09-01 ENCOUNTER — Ambulatory Visit (INDEPENDENT_AMBULATORY_CARE_PROVIDER_SITE_OTHER): Payer: Medicare Other | Admitting: Psychiatry

## 2017-09-01 VITALS — BP 128/77 | HR 86 | Ht 66.0 in | Wt 163.0 lb

## 2017-09-01 DIAGNOSIS — F310 Bipolar disorder, current episode hypomanic: Secondary | ICD-10-CM | POA: Diagnosis not present

## 2017-09-01 DIAGNOSIS — E782 Mixed hyperlipidemia: Secondary | ICD-10-CM | POA: Diagnosis not present

## 2017-09-01 DIAGNOSIS — R197 Diarrhea, unspecified: Secondary | ICD-10-CM | POA: Diagnosis not present

## 2017-09-01 DIAGNOSIS — K219 Gastro-esophageal reflux disease without esophagitis: Secondary | ICD-10-CM | POA: Diagnosis not present

## 2017-09-01 MED ORDER — LITHIUM CARBONATE 300 MG PO TABS: 300.0000 mg | ORAL_TABLET | Freq: Two times a day (BID) | ORAL | 2 refills | Status: DC

## 2017-09-01 MED ORDER — DIAZEPAM 10 MG PO TABS: 10.0000 mg | ORAL_TABLET | Freq: Two times a day (BID) | ORAL | 2 refills | Status: DC

## 2017-09-01 MED ORDER — BUPROPION HCL ER (XL) 300 MG PO TB24: 300.0000 mg | ORAL_TABLET | ORAL | 2 refills | Status: DC

## 2017-09-01 NOTE — Telephone Encounter (Signed)
Pt needs refill for primidone (MYSOLINE) 50 MG tablet with new directions of 2am and 2pm and qty to [facility]
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
## 2017-09-02 MED ORDER — PRIMIDONE 50 MG PO TABS: ORAL_TABLET | ORAL | 5 refills | Status: DC

## 2017-09-02 NOTE — Telephone Encounter (Signed)
Per MD's instructions on 07/06/2017 (see telephone note) ok to increase Primidone 50 mg to a total of 100 mg twice per day.  Rx submitted to [facility] in [place]. MB RN.

## 2017-09-09 ENCOUNTER — Encounter: Payer: Self-pay | Admitting: Internal Medicine

## 2017-09-23 ENCOUNTER — Ambulatory Visit: Payer: Self-pay | Admitting: Gastroenterology

## 2017-10-05 ENCOUNTER — Ambulatory Visit: Payer: Self-pay | Admitting: Gastroenterology

## 2017-10-11 ENCOUNTER — Ambulatory Visit (INDEPENDENT_AMBULATORY_CARE_PROVIDER_SITE_OTHER): Payer: Medicare Other | Admitting: Licensed Clinical Social Worker

## 2017-10-11 ENCOUNTER — Encounter (HOSPITAL_COMMUNITY): Payer: Self-pay | Admitting: Licensed Clinical Social Worker

## 2017-10-11 ENCOUNTER — Other Ambulatory Visit: Payer: Self-pay

## 2017-10-11 DIAGNOSIS — F310 Bipolar disorder, current episode hypomanic: Secondary | ICD-10-CM

## 2017-10-11 NOTE — Progress Notes (Signed)
   THERAPIST PROGRESS NOTE  Session Time: 1:00 pm-1:45 pm  Participation Level: Active  Behavioral Response: CasualAlertIrritable  Type of Therapy: Individual Therapy  Treatment Goals addressed: Coping  Interventions: CBT and Solution Focused  Summary: Andrea Dickson is a 67 y.o. female who presents oriented x5 (person, place, situation, time, and object), alert, average height, average weight, casually dressed, appropriately groomed, and cooperative to address mood. Patient has minimal history of medical treatment. Patient has a history of mental health treatment that includes outpatient therapy, medication management, and hospitalization. Patient admits to symptoms of mania including racing thoughts, lack of sleep but increased energy, and irritability. Patient admits to passive thoughts of suicide and passive homicidal thoughts toward her ex husband. Patient denies plans or means. Patient denies psychosis but admits to seeing "black tracers" that go by her quickly at times. Patient denies substance abuse. She is at low risk for lethality at this time.   Physically: Patient is doing well. Her tremors are ok but are being managed by medication.  Spiritually/values: No issues identified.  Relationships: Patient has a man that calls and texts her daily. She feels he is nice but doesn't want to go on a date with him.  Emotional/Mental/Behavior: Patient's mood is stable. She is feeling well physically and emotionally. Patient understands that she needs to continue to take medication and avoid situations that cause her to become overly irritable. Patient agreed to continue to work on distress tolerance and expose herself to social situations.    Patient engaged in session. She responded well to interventions. Patient continues to meet criteria for Bipolar I disorder, most recent episode (or current) manic. Patient will continue in outpatient therapy due to being the least restrictive service to  meet her needs at this time. Patient made moderate progress on her goals.   Suicidal/Homicidal: Negativewithout intent/plan  Therapist Response: Therapist reviewed patient's recent thoughts and behaviors. Therapist utilized CBT to address mood. Therapist processed patient's feelings to identify triggers for mood. Therapist discussed mood stability and how to maintain it.   Plan: Return again in 2 months.    Diagnosis: Axis I: Bipolar, Manic    Axis II: No diagnosis    Glori Bickers, LCSW 10/11/2017

## 2017-10-11 NOTE — Patient Outreach (Signed)
[facility]) Care Management  10/11/2017  Andrea Dickson 1951-01-07 [id number]   Medication Adherence call to Andrea Dickson spoke with patient she is no longer taking Atorvastatin 20 mg  doctor took her off because of side effects. Andrea Dickson is showing past due under [place].   [facility] Management Direct Dial [phone]  Fax [phone] [email or web address]

## 2017-10-12 LAB — BASIC METABOLIC PANEL
BUN: 9 mg/dL (ref 7–25)
CO2: 26 mmol/L (ref 20–32)
CREATININE: 0.85 mg/dL (ref 0.50–0.99)
Calcium: 9.6 mg/dL (ref 8.6–10.4)
Chloride: 105 mmol/L (ref 98–110)
GLUCOSE: 116 mg/dL — AB (ref 65–99)
Potassium: 4.1 mmol/L (ref 3.5–5.3)
Sodium: 139 mmol/L (ref 135–146)

## 2017-10-12 LAB — LITHIUM LEVEL: Lithium Lvl: 0.7 mmol/L (ref 0.6–1.2)

## 2017-10-12 LAB — TSH: TSH: 2.83 m[IU]/L (ref 0.40–4.50)

## 2017-10-14 DIAGNOSIS — N3001 Acute cystitis with hematuria: Secondary | ICD-10-CM | POA: Diagnosis not present

## 2017-10-14 DIAGNOSIS — Z23 Encounter for immunization: Secondary | ICD-10-CM | POA: Diagnosis not present

## 2017-10-14 DIAGNOSIS — R300 Dysuria: Secondary | ICD-10-CM | POA: Diagnosis not present

## 2017-10-14 DIAGNOSIS — M25572 Pain in left ankle and joints of left foot: Secondary | ICD-10-CM | POA: Diagnosis not present

## 2017-11-01 ENCOUNTER — Ambulatory Visit (HOSPITAL_COMMUNITY): Payer: Self-pay | Admitting: Licensed Clinical Social Worker

## 2017-11-22 ENCOUNTER — Ambulatory Visit (INDEPENDENT_AMBULATORY_CARE_PROVIDER_SITE_OTHER): Payer: Medicare Other | Admitting: Licensed Clinical Social Worker

## 2017-11-22 ENCOUNTER — Encounter (HOSPITAL_COMMUNITY): Payer: Self-pay | Admitting: Licensed Clinical Social Worker

## 2017-11-22 DIAGNOSIS — F310 Bipolar disorder, current episode hypomanic: Secondary | ICD-10-CM

## 2017-11-22 NOTE — Progress Notes (Signed)
   THERAPIST PROGRESS NOTE  Session Time: 1:00 pm-1:45 pm  Participation Level: Active  Behavioral Response: CasualAlertIrritable  Type of Therapy: Individual Therapy  Treatment Goals addressed: Coping  Interventions: CBT and Solution Focused  Summary: Andrea Dickson is a 67 y.o. female who presents oriented x5 (person, place, situation, time, and object), alert, average height, average weight, casually dressed, appropriately groomed, and cooperative to address mood. Patient has minimal history of medical treatment. Patient has a history of mental health treatment that includes outpatient therapy, medication management, and hospitalization. Patient admits to symptoms of mania including racing thoughts, lack of sleep but increased energy, and irritability. Patient admits to passive thoughts of suicide and passive homicidal thoughts toward her ex husband. Patient denies plans or means. Patient denies psychosis but admits to seeing "black tracers" that go by her quickly at times. Patient denies substance abuse. She is at low risk for lethality at this time.   Physically: Patient is doing well. Patient is managing her tremors by adapting.  Spiritually/values: No issues identified.  Relationships: Patient's relationships are going well. She is annoyed with her siblings because they don't help her father out very much and patient has taken responsibility for her parents.  Emotional/Mental/Behavior: Patient's mood is stable. She is feeling well physically and emotionally. Patient feels like she used to.   Patient engaged in session. She responded well to interventions. Patient continues to meet criteria for Bipolar I disorder, most recent episode (or current) manic. Patient will continue in outpatient therapy due to being the least restrictive service to meet her needs at this time. Patient made moderate progress on her goals.   Suicidal/Homicidal: Negativewithout intent/plan  Therapist  Response: Therapist reviewed patient's recent thoughts and behaviors. Therapist utilized CBT to address mood. Therapist processed patient's feelings to identify triggers for mood. Therapist discussed maintaining mood.   Plan: Return again in 2 months.    Diagnosis: Axis I: Bipolar, Manic    Axis II: No diagnosis    Glori Bickers, LCSW 11/22/2017

## 2017-11-23 ENCOUNTER — Encounter: Payer: Self-pay | Admitting: Diagnostic Neuroimaging

## 2017-11-23 ENCOUNTER — Ambulatory Visit (INDEPENDENT_AMBULATORY_CARE_PROVIDER_SITE_OTHER): Payer: Medicare Other | Admitting: Diagnostic Neuroimaging

## 2017-11-23 VITALS — BP 120/71 | HR 83 | Ht 66.0 in | Wt 168.2 lb

## 2017-11-23 DIAGNOSIS — G251 Drug-induced tremor: Secondary | ICD-10-CM | POA: Insufficient documentation

## 2017-11-23 NOTE — Progress Notes (Signed)
[facility]  PATIENT: Andrea Dickson DOB: November 08, 1950  REFERRING CLINICIAN: S Burdine  HISTORY FROM: patient  REASON FOR VISIT: follow up   HISTORICAL  CHIEF COMPLAINT:  Chief Complaint  Patient presents with  . Follow-up    6 month follow up. Sister present. Rm 6. Patient mentioned that she has times where her tremors are so bad that she can't do anything.     HISTORY OF PRESENT ILLNESS:   UPDATE (11/23/17, [facility]): Since last visit, doing about the same. Primidone not helping. Tremors are stable. Mood stabilization meds are working.   PRIOR HPI (05/14/17): 67 year old female with bipolar disorder, hypercholesteremia, here for evaluation of tremors. For past 5 years patient has had onset of tremors in her hands, mainly with action and posture.  Handwriting is affected her handwriting, eating, ability to hold things.  She has some head tremor in her head and neck.  No resting tremor. Patient also has fatigue, palpitations, memory loss, confusion, insomnia, weakness, dizziness, depression, anxiety, racing thoughts.   REVIEW OF SYSTEMS: Full 14 system review of systems performed and negative with exception of: As per HPI.  ALLERGIES: Allergies  Allergen Reactions  . Pramipexole Other (See Comments)    Shaking, palpitations, headache, faint feeling  . Propranolol     Not effective for tremors 2019  . Pollen Extract Other (See Comments)    Eyes and nose run    HOME MEDICATIONS: Outpatient Medications Prior to Visit  Medication Sig Dispense Refill  . atorvastatin (LIPITOR) 20 MG tablet TAKE 1 Tablet BY MOUTH EVERY NIGHT AT BEDTIME 90 tablet 1  . buPROPion (WELLBUTRIN XL) 300 MG 24 hr tablet Take 1 tablet (300 mg total) by mouth every morning. 30 tablet 2  . cetirizine (ZYRTEC) 10 MG tablet Take 10 mg by mouth daily.    . diazepam (VALIUM) 10 MG tablet Take 1 tablet (10 mg total) by mouth 2 (two) times daily. 60 tablet 2  . estrogens, conjugated, (PREMARIN)  1.25 MG tablet Take 1.25 mg by mouth daily. Take one tablet for 21 days and then do not take for 7 days.    Marland Kitchen lithium 300 MG tablet Take 1 tablet (300 mg total) by mouth 2 (two) times daily. 60 tablet 2  . naproxen sodium (ALEVE) 220 MG tablet Take 220 mg by mouth daily as needed.    Marland Kitchen omeprazole (PRILOSEC) 20 MG capsule TAKE 2 Capsules BY MOUTH ONCE DAILY 180 capsule 1  . primidone (MYSOLINE) 50 MG tablet Take 2 tablets twice daily. 120 tablet 5   No facility-administered medications prior to visit.     PAST MEDICAL HISTORY: Past Medical History:  Diagnosis Date  . Anxiety   . Bipolar 1 disorder ([place])   . Depression   . Headache   . High cholesterol     PAST SURGICAL HISTORY: Past Surgical History:  Procedure Laterality Date  . ABDOMINAL HYSTERECTOMY  1997  . carpal tunnel Bilateral 1999, 06/2009  . SKIN CANCER EXCISION  2007  . TONSILLECTOMY  1965  . ulner nerve   06/2009   decompression    FAMILY HISTORY: Family History  Problem Relation Age of Onset  . Bipolar disorder Brother   . Diabetes Brother   . Heart disease Brother   . Diabetes Father   . Heart attack Father   . Heart disease Father   . Diabetes Sister   . Diabetes Mother   . Brain cancer Mother     SOCIAL HISTORY:  Social History   Socioeconomic History  . Marital status: Unknown    Spouse name: Not on file  . Number of children: 1  . Years of education: 71  . Highest education level: Not on file  Occupational History    Comment: employed at [employer]  . Financial resource strain: Not on file  . Food insecurity:    Worry: Not on file    Inability: Not on file  . Transportation needs:    Medical: Not on file    Non-medical: Not on file  Tobacco Use  . Smoking status: Current Every Day Smoker    Packs/day: 1.00    Years: 43.00    Pack years: 43.00    Types: Cigarettes  . Smokeless tobacco: Never Used  . Tobacco comment: smoking since 67yrs old.    Substance and  Sexual Activity  . Alcohol use: No    Alcohol/week: 0.0 standard drinks  . Drug use: No  . Sexual activity: Not Currently  Lifestyle  . Physical activity:    Days per week: Not on file    Minutes per session: Not on file  . Stress: Not on file  Relationships  . Social connections:    Talks on phone: Not on file    Gets together: Not on file    Attends religious service: Not on file    Active member of club or organization: Not on file    Attends meetings of clubs or organizations: Not on file    Relationship status: Not on file  . Intimate partner violence:    Fear of current or ex partner: Not on file    Emotionally abused: Not on file    Physically abused: Not on file    Forced sexual activity: Not on file  Other Topics Concern  . Not on file  Social History Narrative   Lives alone.  Retired.  Education 12th grade.  One child.     Caffeine use- sodas, 2 daily     PHYSICAL EXAM  GENERAL EXAM/CONSTITUTIONAL: Vitals:  Vitals:   11/23/17 1039  BP: 120/71  Pulse: 83  Weight: 168 lb 3.2 oz (76.3 kg)  Height: 5\' 6"  (1.676 m)   Body mass index is 27.15 kg/m. No exam data present  Patient is in no distress; well developed, nourished and groomed; neck is supple  CARDIOVASCULAR:  Examination of carotid arteries is normal; no carotid bruits  Regular rate and rhythm, no murmurs  Examination of peripheral vascular system by observation and palpation is normal  EYES:  Ophthalmoscopic exam of optic discs and posterior segments is normal; no papilledema or hemorrhages  MUSCULOSKELETAL:  Gait, strength, tone, movements noted in Neurologic exam below  NEUROLOGIC: MENTAL STATUS:  No flowsheet data found.  awake, alert, oriented to person, place and time  recent and remote memory intact  normal attention and concentration  language fluent, comprehension intact, naming intact,   fund of knowledge appropriate  CRANIAL NERVE:   2nd - no papilledema on  fundoscopic exam  2nd, 3rd, 4th, 6th - pupils equal and reactive to light, visual fields full to confrontation, extraocular muscles intact, no nystagmus  5th - facial sensation symmetric  7th - facial strength symmetric  8th - hearing intact  9th - palate elevates symmetrically, uvula midline  11th - shoulder shrug symmetric  12th - tongue protrusion midline  MOTOR:   MILD POSTURAL TREMOR IN BUE  NO BRADYKINESIA; NO RIGIDITY  normal bulk; full strength  in the BUE, BLE  SENSORY:   normal and symmetric to light touch  COORDINATION:   finger-nose-finger, fine finger movements normal  REFLEXES:   deep tendon reflexes TRACE and symmetric  GAIT/STATION:   narrow based gait    DIAGNOSTIC DATA (LABS, IMAGING, TESTING) - I reviewed patient records, labs, notes, testing and imaging myself where available.  Lab Results  Component Value Date   WBC 6.6 04/04/2015   HGB 14.8 04/04/2015   HCT 41.6 04/04/2015   MCV 89.5 04/04/2015   PLT 188 04/04/2015      Component Value Date/Time   NA 139 10/11/2017 1236   K 4.1 10/11/2017 1236   CL 105 10/11/2017 1236   CO2 26 10/11/2017 1236   GLUCOSE 116 (H) 10/11/2017 1236   BUN 9 10/11/2017 1236   CREATININE 0.85 10/11/2017 1236   CALCIUM 9.6 10/11/2017 1236   PROT 6.2 02/17/2016 1022   ALBUMIN 3.8 02/17/2016 1022   AST 41 (H) 02/17/2016 1022   ALT 26 02/17/2016 1022   ALKPHOS 110 02/17/2016 1022   BILITOT 0.5 02/17/2016 1022   GFRNONAA 79 11/18/2015 0911   GFRAA >89 11/18/2015 0911   Lab Results  Component Value Date   CHOL 178 02/17/2016   HDL 30 (L) 02/17/2016   LDLCALC 102 (H) 02/17/2016   TRIG 229 (H) 02/17/2016   CHOLHDL 5.9 (H) 02/17/2016   Lab Results  Component Value Date   HGBA1C 5.4 04/24/2015   No results found for: [id number] Lab Results  Component Value Date   TSH 2.83 10/11/2017    10/01/14 MRI brain - tiny non-specific T2 hyperintensities    ASSESSMENT AND PLAN  67 y.o. year old  female here with action and postural tremor since 2014. Empiric trial of essential tremor meds not helpful. Therefore, may represent medication associated tremor or enhanced physiologic tremor.  Patient has tried and failed propranolol and primidone.    Dx:  1. Medication-induced postural tremor     PLAN:  POSTURAL TREMOR (? Medication associated tremor related to lithium and wellbutrin; vs enhanced physiologic tremor) - taper off primidone (not effective) --> 50mg  twice a day x 1 month; then 25mg  twice a day x 1 month; then stop - patient may ask psychiatry to slightly reduce or change mood stabilization meds; however this would be challenging as her bipolar d/o is finally under good control with current meds  Return if symptoms worsen or fail to improve, for return to PCP.    Penni Bombard, MD 33/54/5625, 63:89 AM Certified in Neurology, Neurophysiology and Neuroimaging  [facility] [address] [place], [place] [address] [phone]

## 2017-11-23 NOTE — Patient Instructions (Signed)
  POSTURAL TREMOR (? Medication associated tremor related to lithium and wellbutrin) - taper off primidone (not effective) --> 50mg  twice a day x 1 month; then 25mg  twice a day x 1 month; then stop

## 2017-11-25 DIAGNOSIS — N3001 Acute cystitis with hematuria: Secondary | ICD-10-CM | POA: Diagnosis not present

## 2017-11-25 DIAGNOSIS — G251 Drug-induced tremor: Secondary | ICD-10-CM | POA: Diagnosis not present

## 2017-11-25 DIAGNOSIS — Z7689 Persons encountering health services in other specified circumstances: Secondary | ICD-10-CM | POA: Diagnosis not present

## 2017-11-25 DIAGNOSIS — R300 Dysuria: Secondary | ICD-10-CM | POA: Diagnosis not present

## 2017-11-29 ENCOUNTER — Ambulatory Visit (INDEPENDENT_AMBULATORY_CARE_PROVIDER_SITE_OTHER): Payer: Medicare Other | Admitting: Psychiatry

## 2017-11-29 ENCOUNTER — Encounter (HOSPITAL_COMMUNITY): Payer: Self-pay | Admitting: Psychiatry

## 2017-11-29 VITALS — BP 137/82 | HR 88 | Ht 66.0 in | Wt 162.0 lb

## 2017-11-29 DIAGNOSIS — F310 Bipolar disorder, current episode hypomanic: Secondary | ICD-10-CM

## 2017-11-29 MED ORDER — LITHIUM CARBONATE 300 MG PO TABS: 300.0000 mg | ORAL_TABLET | Freq: Two times a day (BID) | ORAL | 2 refills | Status: DC

## 2017-11-29 MED ORDER — DIAZEPAM 10 MG PO TABS: 10.0000 mg | ORAL_TABLET | Freq: Two times a day (BID) | ORAL | 2 refills | Status: DC

## 2017-11-29 MED ORDER — BUPROPION HCL ER (XL) 150 MG PO TB24: 150.0000 mg | ORAL_TABLET | ORAL | 2 refills | Status: DC
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
## 2017-12-10 DIAGNOSIS — K219 Gastro-esophageal reflux disease without esophagitis: Secondary | ICD-10-CM | POA: Diagnosis not present

## 2017-12-10 DIAGNOSIS — R739 Hyperglycemia, unspecified: Secondary | ICD-10-CM | POA: Diagnosis not present

## 2017-12-10 DIAGNOSIS — E782 Mixed hyperlipidemia: Secondary | ICD-10-CM | POA: Diagnosis not present

## 2017-12-10 DIAGNOSIS — E559 Vitamin D deficiency, unspecified: Secondary | ICD-10-CM | POA: Diagnosis not present

## 2017-12-15 DIAGNOSIS — R197 Diarrhea, unspecified: Secondary | ICD-10-CM | POA: Diagnosis not present

## 2017-12-15 DIAGNOSIS — K219 Gastro-esophageal reflux disease without esophagitis: Secondary | ICD-10-CM | POA: Diagnosis not present

## 2017-12-15 DIAGNOSIS — E782 Mixed hyperlipidemia: Secondary | ICD-10-CM | POA: Diagnosis not present

## 2017-12-21 ENCOUNTER — Telehealth: Payer: Self-pay | Admitting: *Deleted

## 2017-12-21 ENCOUNTER — Other Ambulatory Visit: Payer: Self-pay

## 2017-12-21 ENCOUNTER — Ambulatory Visit (INDEPENDENT_AMBULATORY_CARE_PROVIDER_SITE_OTHER): Payer: Medicare Other | Admitting: Gastroenterology

## 2017-12-21 ENCOUNTER — Encounter: Payer: Self-pay | Admitting: Gastroenterology

## 2017-12-21 DIAGNOSIS — K529 Noninfective gastroenteritis and colitis, unspecified: Secondary | ICD-10-CM

## 2017-12-21 DIAGNOSIS — R159 Full incontinence of feces: Secondary | ICD-10-CM

## 2017-12-21 MED ORDER — CLENPIQ 10-3.5-12 MG-GM -GM/160ML PO SOLN: 1.0000 | Freq: Once | ORAL | 0 refills | Status: AC

## 2017-12-21 NOTE — Telephone Encounter (Signed)
Pre-op scheduled for 01/11/18 at 11:00am. Letter mailed. Patient aware.

## 2017-12-21 NOTE — Patient Instructions (Signed)
1. Colonoscopy as scheduled. See separate instructions.  

## 2017-12-21 NOTE — Progress Notes (Addendum)
Primary Care Physician:  Curlene Labrum, MD  Primary Gastroenterologist:  Garfield Cornea, MD   Chief Complaint  Patient presents with  . Consult    TCS last done 2013. Vomiting with last prep    HPI:  Andrea Dickson is a 67 y.o. female here at the request of Dr. Pleas Koch for colonoscopy.  Her last colonoscopy was around 2013.  She says she was told to come back in 5 years.  Chronic intermittent diarrhea. Occurring for more than a year. Never constipation. Occasional solid stools. Wears a pad in case of fecal incontinence.  Postprandial fecal urgency.  On bad days she has about 3 stools a day.  No nocturnal BM. No melena, brbpr. Some mild lower abdominal discomfort at times. Gradual 15 pound weight loss per patient.  Weights available to me indicate same weight 6 months ago.  Heartburn controlled.     Current Outpatient Medications  Medication Sig Dispense Refill  . buPROPion (WELLBUTRIN XL) 150 MG 24 hr tablet Take 1 tablet (150 mg total) by mouth every morning. 30 tablet 2  . cetirizine (ZYRTEC) 10 MG tablet Take 10 mg by mouth daily.    . Cholecalciferol (VITAMIN D3) 50 MCG (2000 UT) TABS Take 1 tablet by mouth daily.    . diazepam (VALIUM) 10 MG tablet Take 1 tablet (10 mg total) by mouth 2 (two) times daily. 60 tablet 2  . estrogens, conjugated, (PREMARIN) 1.25 MG tablet Take 1.25 mg by mouth daily. Take one tablet for 21 days and then do not take for 7 days.    Marland Kitchen lithium 300 MG tablet Take 1 tablet (300 mg total) by mouth 2 (two) times daily. 60 tablet 2  . naproxen sodium (ALEVE) 220 MG tablet Take 220 mg by mouth daily as needed.    Marland Kitchen omeprazole (PRILOSEC) 20 MG capsule TAKE 2 Capsules BY MOUTH ONCE DAILY (Patient taking differently: Take 40 mg by mouth daily. ) 180 capsule 1  . primidone (MYSOLINE) 50 MG tablet Take 2 tablets twice daily. (Patient taking differently: Take 25 mg by mouth daily. Take 2 tablets twice daily.) 120 tablet 5   No current facility-administered  medications for this visit.     Allergies as of 12/21/2017 - Review Complete 12/21/2017  Allergen Reaction Noted  . Pramipexole Other (See Comments) 04/24/2015  . Propranolol  05/14/2017  . Pollen extract Other (See Comments) 04/24/2015    Past Medical History:  Diagnosis Date  . Anxiety   . Bipolar 1 disorder ([place])   . Depression   . GERD (gastroesophageal reflux disease)   . Headache   . High cholesterol     Past Surgical History:  Procedure Laterality Date  . ABDOMINAL HYSTERECTOMY  1997  . carpal tunnel Bilateral 1999, 06/2009  . SKIN CANCER EXCISION  2007  . TONSILLECTOMY  1965  . ulner nerve  Left 06/2009   decompression    Family History  Problem Relation Age of Onset  . Bipolar disorder Brother   . Diabetes Brother   . Heart disease Brother   . Diabetes Father   . Heart attack Father   . Heart disease Father   . Diabetes Sister   . Diabetes Mother   . Brain cancer Mother   . Colon cancer Neg Hx     Social History   Socioeconomic History  . Marital status: Unknown    Spouse name: Not on file  . Number of children: 1  . Years of education: 17  .  Highest education level: Not on file  Occupational History    Comment: employed at [employer]  . Financial resource strain: Not on file  . Food insecurity:    Worry: Not on file    Inability: Not on file  . Transportation needs:    Medical: Not on file    Non-medical: Not on file  Tobacco Use  . Smoking status: Current Every Day Smoker    Packs/day: 1.00    Years: 43.00    Pack years: 43.00    Types: Cigarettes  . Smokeless tobacco: Never Used  . Tobacco comment: smoking since 67yrs old.    Substance and Sexual Activity  . Alcohol use: No    Alcohol/week: 0.0 standard drinks  . Drug use: No  . Sexual activity: Not Currently  Lifestyle  . Physical activity:    Days per week: Not on file    Minutes per session: Not on file  . Stress: Not on file  Relationships  . Social  connections:    Talks on phone: Not on file    Gets together: Not on file    Attends religious service: Not on file    Active member of club or organization: Not on file    Attends meetings of clubs or organizations: Not on file    Relationship status: Not on file  . Intimate partner violence:    Fear of current or ex partner: Not on file    Emotionally abused: Not on file    Physically abused: Not on file    Forced sexual activity: Not on file  Other Topics Concern  . Not on file  Social History Narrative   Lives alone.  Retired.  Education 12th grade.  One child.     Caffeine use- sodas, 2 daily      ROS:  General: Negative for anorexia, weight loss, fever, chills, fatigue, weakness. Eyes: Negative for vision changes.  ENT: Negative for hoarseness, difficulty swallowing , nasal congestion. CV: Negative for chest pain, angina, palpitations, dyspnea on exertion, peripheral edema.  Respiratory: Negative for dyspnea at rest, dyspnea on exertion, cough, sputum, wheezing.  GI: See history of present illness. GU:  Negative for dysuria, hematuria, urinary incontinence, urinary frequency, nocturnal urination.  MS: Negative for joint pain, low back pain.  Derm: Negative for rash or itching.  Neuro: Negative for weakness, abnormal sensation, seizure, , memory loss, confusion.  History of headache Psych: Negative forsuicidal ideation, hallucinations.  Positive anxiety and depression Endo: Negative for unusual weight change.  Heme: Negative for bruising or bleeding. Allergy: Negative for rash or hives.    Physical Examination:  BP 132/80   Pulse 94   Temp (!) 97.3 F (36.3 C) (Oral)   Ht 5\' 6"  (1.676 m)   Wt 165 lb 6.4 oz (75 kg)   BMI 26.70 kg/m    General: Well-nourished, well-developed in no acute distress.  Head: Normocephalic, atraumatic.   Eyes: Conjunctiva pink, no icterus. Mouth: Oropharyngeal mucosa moist and pink , no lesions erythema or exudate. Neck: Supple  without thyromegaly, masses, or lymphadenopathy.  Lungs: Clear to auscultation bilaterally.  Heart: Regular rate and rhythm, no murmurs rubs or gallops.  Abdomen: Bowel sounds are normal, nontender, nondistended, no hepatosplenomegaly or masses, no abdominal bruits or    hernia , no rebound or guarding.   Rectal: Not performed Extremities: No lower extremity edema. No clubbing or deformities.  Neuro: Alert and oriented x 4 , grossly normal neurologically.  Skin: Warm and dry, no rash or jaundice.   Psych: Alert and cooperative, normal mood and affect.  Labs: Lab Results  Component Value Date   CREATININE 0.85 10/11/2017   BUN 9 10/11/2017   NA 139 10/11/2017   K 4.1 10/11/2017   CL 105 10/11/2017   CO2 26 10/11/2017    Lab Results  Component Value Date   TSH 2.83 10/11/2017      Imaging Studies: No results found.

## 2017-12-24 ENCOUNTER — Encounter: Payer: Self-pay | Admitting: Gastroenterology

## 2017-12-24 NOTE — Assessment & Plan Note (Addendum)
67 year old female reports she is due for colonoscopy, last one in 2013.  Patient states Dr. Britta Mccreedy advised to have a repeat in 5 years due to the quality of prep.  Complaining of chronic intermittent diarrhea with fecal urgency, incontinence at times for more than a year. ?microscopic colitis.  Plan for colonoscopy with deep sedation in the near future (polypharmacy).  Consider random colon biopsies to evaluate diarrhea.  I have discussed the risks, alternatives, benefits with regards to but not limited to the risk of reaction to medication, bleeding, infection, perforation and the patient is agreeable to proceed. Written consent to be obtained.

## 2017-12-24 NOTE — Progress Notes (Signed)
Received a copy of her colonoscopy report dated October 09, 2011 by Dr. Britta Mccreedy.  No colonic or rectal polyps.  The quality of the prep was fair.  Repeat in 5 years.

## 2017-12-24 NOTE — H&P (View-Only) (Signed)
Received a copy of her colonoscopy report dated October 09, 2011 by Dr. Britta Mccreedy.  No colonic or rectal polyps.  The quality of the prep was fair.  Repeat in 5 years.

## 2017-12-27 NOTE — Progress Notes (Signed)
cc'd to pcp 

## 2018-01-11 ENCOUNTER — Encounter (HOSPITAL_COMMUNITY)
Admission: RE | Admit: 2018-01-11 | Discharge: 2018-01-11 | Disposition: A | Discharge status: Home / Self Care | Payer: Medicare Other | Source: Ambulatory Visit | Attending: Internal Medicine | Admitting: Internal Medicine

## 2018-01-17 ENCOUNTER — Encounter (HOSPITAL_COMMUNITY): Payer: Self-pay | Admitting: *Deleted

## 2018-01-17 ENCOUNTER — Ambulatory Visit (HOSPITAL_COMMUNITY): Payer: Medicare Other | Admitting: Anesthesiology

## 2018-01-17 ENCOUNTER — Encounter (HOSPITAL_COMMUNITY)
Admission: RE | Disposition: A | Discharge status: Home / Self Care | Payer: Self-pay | Source: Home / Self Care | Attending: Internal Medicine

## 2018-01-17 ENCOUNTER — Ambulatory Visit (HOSPITAL_COMMUNITY)
Admission: RE | Admit: 2018-01-17 | Discharge: 2018-01-17 | Disposition: A | Discharge status: Home / Self Care | Payer: Medicare Other | Attending: Internal Medicine | Admitting: Internal Medicine

## 2018-01-17 DIAGNOSIS — D175 Benign lipomatous neoplasm of intra-abdominal organs: Secondary | ICD-10-CM | POA: Diagnosis not present

## 2018-01-17 DIAGNOSIS — F17200 Nicotine dependence, unspecified, uncomplicated: Secondary | ICD-10-CM | POA: Diagnosis not present

## 2018-01-17 DIAGNOSIS — K529 Noninfective gastroenteritis and colitis, unspecified: Secondary | ICD-10-CM | POA: Diagnosis not present

## 2018-01-17 DIAGNOSIS — D122 Benign neoplasm of ascending colon: Secondary | ICD-10-CM | POA: Insufficient documentation

## 2018-01-17 DIAGNOSIS — F419 Anxiety disorder, unspecified: Secondary | ICD-10-CM | POA: Insufficient documentation

## 2018-01-17 DIAGNOSIS — R51 Headache: Secondary | ICD-10-CM | POA: Diagnosis not present

## 2018-01-17 DIAGNOSIS — F319 Bipolar disorder, unspecified: Secondary | ICD-10-CM | POA: Diagnosis not present

## 2018-01-17 DIAGNOSIS — K219 Gastro-esophageal reflux disease without esophagitis: Secondary | ICD-10-CM | POA: Diagnosis not present

## 2018-01-17 DIAGNOSIS — K319 Disease of stomach and duodenum, unspecified: Secondary | ICD-10-CM | POA: Diagnosis not present

## 2018-01-17 DIAGNOSIS — R159 Full incontinence of feces: Secondary | ICD-10-CM

## 2018-01-17 HISTORY — DX: Essential tremor: G25.0

## 2018-01-17 HISTORY — PX: COLONOSCOPY WITH PROPOFOL: SHX5780

## 2018-01-17 HISTORY — PX: BIOPSY: SHX5522

## 2018-01-17 HISTORY — PX: POLYPECTOMY: SHX5525

## 2018-01-17 SURGERY — COLONOSCOPY WITH PROPOFOL
Anesthesia: Monitor Anesthesia Care

## 2018-01-17 MED ORDER — LACTATED RINGERS IV SOLN
INTRAVENOUS | Status: DC | PRN
  Administered 2018-01-17: 13:00:00 via INTRAVENOUS

## 2018-01-17 MED ORDER — PROPOFOL 10 MG/ML IV BOLUS
INTRAVENOUS | Status: AC
  Filled 2018-01-17: qty 20

## 2018-01-17 MED ORDER — ONDANSETRON HCL 4 MG/2ML IJ SOLN
INTRAMUSCULAR | Status: DC | PRN
  Administered 2018-01-17: 4 mg via INTRAVENOUS

## 2018-01-17 MED ORDER — LACTATED RINGERS IV SOLN: INTRAVENOUS | Status: DC

## 2018-01-17 MED ORDER — HYDROMORPHONE HCL 1 MG/ML IJ SOLN: 0.2500 mg | INTRAMUSCULAR | Status: DC | PRN

## 2018-01-17 MED ORDER — PROMETHAZINE HCL 25 MG/ML IJ SOLN: 6.2500 mg | INTRAMUSCULAR | Status: DC | PRN

## 2018-01-17 MED ORDER — MIDAZOLAM HCL 2 MG/2ML IJ SOLN
INTRAMUSCULAR | Status: AC
  Filled 2018-01-17: qty 2

## 2018-01-17 MED ORDER — PROPOFOL 10 MG/ML IV BOLUS
INTRAVENOUS | Status: DC | PRN
  Administered 2018-01-17 (×2): 15 mg via INTRAVENOUS

## 2018-01-17 MED ORDER — CHLORHEXIDINE GLUCONATE CLOTH 2 % EX PADS: 6.0000 | MEDICATED_PAD | Freq: Once | CUTANEOUS | Status: DC

## 2018-01-17 MED ORDER — HYDROCODONE-ACETAMINOPHEN 7.5-325 MG PO TABS: 1.0000 | ORAL_TABLET | Freq: Once | ORAL | Status: DC | PRN

## 2018-01-17 MED ORDER — MIDAZOLAM HCL 5 MG/5ML IJ SOLN
INTRAMUSCULAR | Status: DC | PRN
  Administered 2018-01-17: 2 mg via INTRAVENOUS

## 2018-01-17 MED ORDER — PROPOFOL 500 MG/50ML IV EMUL
INTRAVENOUS | Status: DC | PRN
  Administered 2018-01-17: 14:00:00 via INTRAVENOUS
  Administered 2018-01-17: 125 ug/kg/min via INTRAVENOUS
  Administered 2018-01-17: 150 ug/kg/min via INTRAVENOUS

## 2018-01-17 MED ORDER — MEPERIDINE HCL 100 MG/ML IJ SOLN: 6.2500 mg | INTRAMUSCULAR | Status: DC | PRN

## 2018-01-17 MED ORDER — ONDANSETRON HCL 4 MG/2ML IJ SOLN
INTRAMUSCULAR | Status: AC
  Filled 2018-01-17: qty 2

## 2018-01-17 MED ORDER — LACTATED RINGERS IV SOLN
INTRAVENOUS | Status: DC
  Administered 2018-01-17: 13:00:00 via INTRAVENOUS

## 2018-01-17 NOTE — Interval H&P Note (Signed)
History and Physical Interval Note:  01/17/2018 1:31 PM  Andrea Dickson  has presented today for surgery, with the diagnosis of chronic diarrhea, fecal incontinence  The various methods of treatment have been discussed with the patient and family. After consideration of risks, benefits and other options for treatment, the patient has consented to  Procedure(s) with comments: COLONOSCOPY WITH PROPOFOL (N/A) - 2:00pm as a surgical intervention .  The patient's history has been reviewed, patient examined, no change in status, stable for surgery.  I have reviewed the patient's chart and labs.  Questions were answered to the patient's satisfaction.     Robert Rourk  No change.  Diagnostic colonoscopy per plan.  The risks, benefits, limitations, alternatives and imponderables have been reviewed with the patient. Questions have been answered. All parties are agreeable.

## 2018-01-17 NOTE — Transfer of Care (Signed)
Immediate Anesthesia Transfer of Care Note  Patient: Andrea Dickson  Procedure(s) Performed: COLONOSCOPY WITH PROPOFOL (N/A ) BIOPSY POLYPECTOMY  Patient Location: PACU  Anesthesia Type:General  Level of Consciousness: awake and patient cooperative  Airway & Oxygen Therapy: Patient Spontanous Breathing  Post-op Assessment: Report given to RN  Post vital signs: Reviewed and stable  Last Vitals:  Vitals Value Taken Time  BP    Temp    Pulse 77 01/17/2018  2:03 PM  Resp 15 01/17/2018  2:03 PM  SpO2 83 % 01/17/2018  2:03 PM  Vitals shown include unvalidated device data.  Last Pain:  Vitals:   01/17/18 1335  TempSrc:   PainSc: 0-No pain      Patients Stated Pain Goal: 6 ([id number])  Complications: No apparent anesthesia complications

## 2018-01-17 NOTE — Discharge Instructions (Signed)
Colonoscopy Discharge Instructions  Read the instructions outlined below and refer to this sheet in the next few weeks. These discharge instructions provide you with general information on caring for yourself after you leave the hospital. Your doctor may also give you specific instructions. While your treatment has been planned according to the most current medical practices available, unavoidable complications occasionally occur. If you have any problems or questions after discharge, call Dr. Gala Romney at [phone]. ACTIVITY  You may resume your regular activity, but move at a slower pace for the next 24 hours.   Take frequent rest periods for the next 24 hours.   Walking will help get rid of the air and reduce the bloated feeling in your belly (abdomen).   No driving for 24 hours (because of the medicine (anesthesia) used during the test).    Do not sign any important legal documents or operate any machinery for 24 hours (because of the anesthesia used during the test).  NUTRITION  Drink plenty of fluids.   You may resume your normal diet as instructed by your doctor.   Begin with a light meal and progress to your normal diet. Heavy or fried foods are harder to digest and may make you feel sick to your stomach (nauseated).   Avoid alcoholic beverages for 24 hours or as instructed.  MEDICATIONS  You may resume your normal medications unless your doctor tells you otherwise.  WHAT YOU CAN EXPECT TODAY  Some feelings of bloating in the abdomen.   Passage of more gas than usual.   Spotting of blood in your stool or on the toilet paper.  IF YOU HAD POLYPS REMOVED DURING THE COLONOSCOPY:  No aspirin products for 7 days or as instructed.   No alcohol for 7 days or as instructed.   Eat a soft diet for the next 24 hours.  FINDING OUT THE RESULTS OF YOUR TEST Not all test results are available during your visit. If your test results are not back during the visit, make an appointment  with your caregiver to find out the results. Do not assume everything is normal if you have not heard from your caregiver or the medical facility. It is important for you to follow up on all of your test results.  SEEK IMMEDIATE MEDICAL ATTENTION IF:  You have more than a spotting of blood in your stool.   Your belly is swollen (abdominal distention).   You are nauseated or vomiting.   You have a temperature over 101.   You have abdominal pain or discomfort that is severe or gets worse throughout the day.     Colon polyp information provided  Further recommendations to follow pending review of pathology report  Office visit with [place] in 3 months     Colon Polyps  Polyps are tissue growths inside the body. Polyps can grow in many places, including the large intestine (colon). A polyp may be a round bump or a mushroom-shaped growth. You could have one polyp or several. Most colon polyps are noncancerous (benign). However, some colon polyps can become cancerous over time. Finding and removing the polyps early can help prevent this. What are the causes? The exact cause of colon polyps is not known. What increases the risk? You are more likely to develop this condition if you:  Have a family history of colon cancer or colon polyps.  Are older than 55 or older than 45 if you are African American.  Have inflammatory bowel disease, such as  ulcerative colitis or Crohn's disease.  Have certain hereditary conditions, such as: ? Familial adenomatous polyposis. ? Lynch syndrome. ? Turcot syndrome. ? Peutz-Jeghers syndrome.  Are overweight.  Smoke cigarettes.  Do not get enough exercise.  Drink too much alcohol.  Eat a diet that is high in fat and red meat and low in fiber.  Had childhood cancer that was treated with abdominal radiation. What are the signs or symptoms? Most polyps do not cause symptoms. If you have symptoms, they may include:  Blood coming from your rectum  when having a bowel movement.  Blood in your stool. The stool may look dark red or black.  Abdominal pain.  A change in bowel habits, such as constipation or diarrhea. How is this diagnosed? This condition is diagnosed with a colonoscopy. This is a procedure in which a lighted, flexible scope is inserted into the anus and then passed into the colon to examine the area. Polyps are sometimes found when a colonoscopy is done as part of routine cancer screening tests. How is this treated? Treatment for this condition involves removing any polyps that are found. Most polyps can be removed during a colonoscopy. Those polyps will then be tested for cancer. Additional treatment may be needed depending on the results of testing. Follow these instructions at home: Lifestyle  Maintain a healthy weight, or lose weight if recommended by your health care provider.  Exercise every day or as told by your health care provider.  Do not use any products that contain nicotine or tobacco, such as cigarettes and e-cigarettes. If you need help quitting, ask your health care provider.  If you drink alcohol, limit how much you have: ? 0-1 drink a day for women. ? 0-2 drinks a day for men.  Be aware of how much alcohol is in your drink. In the [place] one drink equals one 12 oz bottle of beer (355 mL), one 5 oz glass of wine (148 mL), or one 1 oz shot of hard liquor (44 mL). Eating and drinking   Eat foods that are high in fiber, such as fruits, vegetables, and whole grains.  Eat foods that are high in calcium and vitamin D, such as milk, cheese, yogurt, eggs, liver, fish, and broccoli.  Limit foods that are high in fat, such as fried foods and desserts.  Limit the amount of red meat and processed meat you eat, such as hot dogs, sausage, bacon, and lunch meats. General instructions  Keep all follow-up visits as told by your health care provider. This is important. ? This includes having regularly  scheduled colonoscopies. ? Talk to your health care provider about when you need a colonoscopy. Contact a health care provider if:  You have new or worsening bleeding during a bowel movement.  You have new or increased blood in your stool.  You have a change in bowel habits.  You lose weight for no known reason. Summary  Polyps are tissue growths inside the body. Polyps can grow in many places, including the colon.  Most colon polyps are noncancerous (benign), but some can become cancerous over time.  This condition is diagnosed with a colonoscopy.  Treatment for this condition involves removing any polyps that are found. Most polyps can be removed during a colonoscopy. This information is not intended to replace advice given to you by your health care provider. Make sure you discuss any questions you have with your health care provider. Document Released: 09/25/2003 Document Revised: 04/15/2017 Document Reviewed: 04/15/2017  Elsevier Interactive Patient Education  2019 [place], Care After These instructions provide you with information about caring for yourself after your procedure. Your health care provider may also give you more specific instructions. Your treatment has been planned according to current medical practices, but problems sometimes occur. Call your health care provider if you have any problems or questions after your procedure. What can I expect after the procedure? After your procedure, you may:  Feel sleepy for several hours.  Feel clumsy and have poor balance for several hours.  Feel forgetful about what happened after the procedure.  Have poor judgment for several hours.  Feel nauseous or vomit.  Have a sore throat if you had a breathing tube during the procedure. Follow these instructions at home: For at least 24 hours after the procedure:      Have a responsible adult stay with you. It is important to have  someone help care for you until you are awake and alert.  Rest as needed.  Do not: ? Participate in activities in which you could fall or become injured. ? Drive. ? Use heavy machinery. ? Drink alcohol. ? Take sleeping pills or medicines that cause drowsiness. ? Make important decisions or sign legal documents. ? Take care of children on your own. Eating and drinking  Follow the diet that is recommended by your health care provider.  If you vomit, drink water, juice, or soup when you can drink without vomiting.  Make sure you have little or no nausea before eating solid foods. General instructions  Take over-the-counter and prescription medicines only as told by your health care provider.  If you have sleep apnea, surgery and certain medicines can increase your risk for breathing problems. Follow instructions from your health care provider about wearing your sleep device: ? Anytime you are sleeping, including during daytime naps. ? While taking prescription pain medicines, sleeping medicines, or medicines that make you drowsy.  If you smoke, do not smoke without supervision.  Keep all follow-up visits as told by your health care provider. This is important. Contact a health care provider if:  You keep feeling nauseous or you keep vomiting.  You feel light-headed.  You develop a rash.  You have a fever. Get help right away if:  You have trouble breathing. Summary  For several hours after your procedure, you may feel sleepy and have poor judgment.  Have a responsible adult stay with you for at least 24 hours or until you are awake and alert. This information is not intended to replace advice given to you by your health care provider. Make sure you discuss any questions you have with your health care provider. Document Released: 04/21/2015 Document Revised: 08/14/2016 Document Reviewed: 04/21/2015 Elsevier Interactive Patient Education  2019 [employer].

## 2018-01-17 NOTE — Op Note (Signed)
[facility] Patient Name: Andrea Dickson Procedure Date: 01/17/2018 1:21 PM MRN: [id number] Date of Birth: 06/10/1950 Attending MD: Norvel Richards , MD CSN: [id number] Age: 68 Admit Type: Outpatient Procedure:                Colonoscopy Indications:              Chronic diarrhea Providers:                Norvel Richards, MD, Jeanann Lewandowsky. Sharon Seller, RN,                            Aram Candela Referring MD:             Curlene Labrum Medicines:                Propofol per Anesthesia Complications:            No immediate complications. Estimated Blood Loss:     Estimated blood loss was minimal. Procedure:                Pre-Anesthesia Assessment:                           - Prior to the procedure, a History and Physical                            was performed, and patient medications and                            allergies were reviewed. The patient's tolerance of                            previous anesthesia was also reviewed. The risks                            and benefits of the procedure and the sedation                            options and risks were discussed with the patient.                            All questions were answered, and informed consent                            was obtained. Prior Anticoagulants: The patient has                            taken no previous anticoagulant or antiplatelet                            agents. ASA Grade Assessment: II - A patient with                            mild systemic disease. After reviewing the risks  and benefits, the patient was deemed in                            satisfactory condition to undergo the procedure.                           After obtaining informed consent, the colonoscope                            was passed under direct vision. Throughout the                            procedure, the patient's blood pressure, pulse, and                            oxygen  saturations were monitored continuously. The                            [id number]-[id number] ([id number]) scope was introduced through                            the and advanced to the the cecum, identified by                            appendiceal orifice and ileocecal valve. The                            colonoscopy was performed without difficulty. The                            patient tolerated the procedure well. The quality                            of the bowel preparation was adequate. The entire                            colon was well visualized. Scope In: 1:39:38 PM Scope Out: 1:55:56 PM Scope Withdrawal Time: 0 hours 11 minutes 48 seconds  Total Procedure Duration: 0 hours 16 minutes 18 seconds  Findings:      The perianal and digital rectal examinations were normal.      Two sessile polyps were found in the ascending colon. The polyps were 4       to 6 mm in size. Estimated blood loss was minimal. Segmental biopsies of       right and left colon also taken to assess for chronic diarrhea. Patient       had a 2 cm yellowish submucosal nodule in the transverse segment.       Positive pillow sign. Consistent with a lipoma.      The remainder of the colon appeared normal. Impression:               - Two 4 to 6 mm polyps in the ascending colon.  Segmental biopsy. Colonic lipoma. Moderate Sedation:      Moderate (conscious) sedation was personally administered by an       anesthesia professional. The following parameters were monitored: oxygen       saturation, heart rate, blood pressure, respiratory rate, EKG, adequacy       of pulmonary ventilation, and response to care. Recommendation:           - Patient has a contact number available for                            emergencies. The signs and symptoms of potential                            delayed complications were discussed with the                            patient. Return to normal activities tomorrow.                             Written discharge instructions were provided to the                            patient.                           - Advance diet as tolerated.                           - Continue present medications.                           - Repeat colonoscopy date to be determined after                            pending pathology results are reviewed for                            surveillance based on pathology results.                           - Return to GI office in 3 months. Procedure Code(s):        --- Professional ---                           [phone], Colonoscopy, flexible; diagnostic, including                            collection of specimen(s) by brushing or washing,                            when performed (separate procedure) Diagnosis Code(s):        --- Professional ---                           D12.2, Benign neoplasm of ascending colon  K52.9, Noninfective gastroenteritis and colitis,                            unspecified CPT copyright 2018 American Medical Association. All rights reserved. The codes documented in this report are preliminary and upon coder review may  be revised to meet current compliance requirements. Cristopher Estimable. Rourk, MD Norvel Richards, MD 01/17/2018 2:06:00 PM This report has been signed electronically. Number of Addenda: 0

## 2018-01-17 NOTE — Anesthesia Postprocedure Evaluation (Signed)
Anesthesia Post Note  Patient: Andrea Dickson  Procedure(s) Performed: COLONOSCOPY WITH PROPOFOL (N/A ) BIOPSY POLYPECTOMY  Patient location during evaluation: PACU Anesthesia Type: General Level of consciousness: awake and patient cooperative Pain management: pain level controlled Vital Signs Assessment: post-procedure vital signs reviewed and stable Respiratory status: spontaneous breathing, nonlabored ventilation and respiratory function stable Cardiovascular status: blood pressure returned to baseline Postop Assessment: no apparent nausea or vomiting Anesthetic complications: no     Last Vitals:  Vitals:   01/17/18 1301  BP: (!) 146/74  Pulse: 84  Resp: 14  Temp: 37 C  SpO2: 96%    Last Pain:  Vitals:   01/17/18 1335  TempSrc:   PainSc: 0-No pain                 [name] J

## 2018-01-17 NOTE — Anesthesia Preprocedure Evaluation (Signed)
Anesthesia Evaluation    Airway Mallampati: II       Dental  (+) Upper Dentures, Dental Advidsory Given   Pulmonary Current Smoker,    breath sounds clear to auscultation       Cardiovascular  Rhythm:regular     Neuro/Psych  Headaches, PSYCHIATRIC DISORDERS Anxiety Depression Bipolar Disorder    GI/Hepatic GERD  ,  Endo/Other    Renal/GU      Musculoskeletal   Abdominal   Peds  Hematology   Anesthesia Other Findings   Reproductive/Obstetrics                             Anesthesia Physical Anesthesia Plan  ASA: III  Anesthesia Plan: MAC   Post-op Pain Management:    Induction:   PONV Risk Score and Plan:   Airway Management Planned:   Additional Equipment:   Intra-op Plan:   Post-operative Plan:   Informed Consent: I have reviewed the patients History and Physical, chart, labs and discussed the procedure including the risks, benefits and alternatives for the proposed anesthesia with the patient or authorized representative who has indicated his/her understanding and acceptance.   Dental Advisory Given  Plan Discussed with: Anesthesiologist  Anesthesia Plan Comments:         Anesthesia Quick Evaluation

## 2018-01-20 ENCOUNTER — Ambulatory Visit (HOSPITAL_COMMUNITY): Payer: Medicare Other | Admitting: Licensed Clinical Social Worker

## 2018-01-21 ENCOUNTER — Encounter: Payer: Self-pay | Admitting: Internal Medicine

## 2018-01-28 ENCOUNTER — Encounter (HOSPITAL_COMMUNITY): Payer: Self-pay | Admitting: Internal Medicine

## 2018-01-31 ENCOUNTER — Ambulatory Visit (INDEPENDENT_AMBULATORY_CARE_PROVIDER_SITE_OTHER): Payer: Medicare Other | Admitting: Psychiatry

## 2018-01-31 ENCOUNTER — Encounter (HOSPITAL_COMMUNITY): Payer: Self-pay | Admitting: Psychiatry

## 2018-01-31 VITALS — BP 150/82 | HR 97 | Ht 66.0 in | Wt 165.0 lb

## 2018-01-31 DIAGNOSIS — F310 Bipolar disorder, current episode hypomanic: Secondary | ICD-10-CM

## 2018-01-31 MED ORDER — LITHIUM CARBONATE 300 MG PO TABS: 600.0000 mg | ORAL_TABLET | Freq: Every day | ORAL | 2 refills | Status: DC

## 2018-01-31 MED ORDER — DIAZEPAM 10 MG PO TABS: 10.0000 mg | ORAL_TABLET | Freq: Two times a day (BID) | ORAL | 2 refills | Status: DC

## 2018-01-31 MED ORDER — BUPROPION HCL ER (XL) 150 MG PO TB24: 150.0000 mg | ORAL_TABLET | ORAL | 2 refills | Status: DC
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
## 2018-02-07 DIAGNOSIS — D126 Benign neoplasm of colon, unspecified: Secondary | ICD-10-CM | POA: Diagnosis not present

## 2018-02-07 DIAGNOSIS — R509 Fever, unspecified: Secondary | ICD-10-CM | POA: Diagnosis not present

## 2018-02-07 DIAGNOSIS — J209 Acute bronchitis, unspecified: Secondary | ICD-10-CM | POA: Diagnosis not present

## 2018-02-07 DIAGNOSIS — R51 Headache: Secondary | ICD-10-CM | POA: Diagnosis not present

## 2018-02-08 ENCOUNTER — Ambulatory Visit (HOSPITAL_COMMUNITY): Payer: Medicare Other | Admitting: Licensed Clinical Social Worker

## 2018-03-15 DIAGNOSIS — R739 Hyperglycemia, unspecified: Secondary | ICD-10-CM | POA: Diagnosis not present

## 2018-03-15 DIAGNOSIS — E782 Mixed hyperlipidemia: Secondary | ICD-10-CM | POA: Diagnosis not present

## 2018-03-15 DIAGNOSIS — G252 Other specified forms of tremor: Secondary | ICD-10-CM | POA: Diagnosis not present

## 2018-03-15 DIAGNOSIS — G44219 Episodic tension-type headache, not intractable: Secondary | ICD-10-CM | POA: Diagnosis not present

## 2018-03-15 DIAGNOSIS — K219 Gastro-esophageal reflux disease without esophagitis: Secondary | ICD-10-CM | POA: Diagnosis not present

## 2018-03-17 DIAGNOSIS — E782 Mixed hyperlipidemia: Secondary | ICD-10-CM | POA: Diagnosis not present

## 2018-03-17 DIAGNOSIS — R911 Solitary pulmonary nodule: Secondary | ICD-10-CM | POA: Diagnosis not present

## 2018-03-17 DIAGNOSIS — G44219 Episodic tension-type headache, not intractable: Secondary | ICD-10-CM | POA: Diagnosis not present

## 2018-03-17 DIAGNOSIS — G251 Drug-induced tremor: Secondary | ICD-10-CM | POA: Diagnosis not present

## 2018-04-25 ENCOUNTER — Other Ambulatory Visit (HOSPITAL_COMMUNITY): Payer: Self-pay | Admitting: *Deleted

## 2018-04-26 ENCOUNTER — Other Ambulatory Visit: Payer: Self-pay

## 2018-04-26 ENCOUNTER — Ambulatory Visit (INDEPENDENT_AMBULATORY_CARE_PROVIDER_SITE_OTHER): Payer: Medicare Other | Admitting: Gastroenterology

## 2018-04-26 ENCOUNTER — Encounter: Payer: Self-pay | Admitting: Gastroenterology

## 2018-04-26 DIAGNOSIS — K529 Noninfective gastroenteritis and colitis, unspecified: Secondary | ICD-10-CM

## 2018-04-26 MED ORDER — DICYCLOMINE HCL 10 MG PO CAPS: ORAL_CAPSULE | ORAL | 1 refills | Status: DC

## 2018-04-26 NOTE — Progress Notes (Signed)
CC'D TO PCP °

## 2018-04-26 NOTE — Patient Instructions (Signed)
1. Keep a stool diary detailing number of stools per day, stool consistency (hard, formed but soft, loose, watery) in any particular foods that you ate especially 24 hours or less before any loose stools. 2. Review FODMAP foods.  Limit any foods in the high FODMAP column as these will be more likely to increase abdominal bloating and gas. 3. Consider using lactose free products which are available for both milk and ice cream.  Other options are to add Lactaid supplements when he choose to eat these products.  He can purchase over-the-counter. 4. Trial of dicyclomine for abdominal cramping and loose stools.  Start out taking 1 every morning to try and prevent symptoms.  You may take up to 3/day however as needed.  Hold for constipation. 5. Return to the office in 6 months or call sooner if needed.   Low-FODMAP Eating Plan  FODMAPs (fermentable oligosaccharides, disaccharides, monosaccharides, and polyols) are sugars that are hard for some people to digest. A low-FODMAP eating plan may help some people who have bowel (intestinal) diseases to manage their symptoms. This meal plan can be complicated to follow. Work with a diet and nutrition specialist (dietitian) to make a low-FODMAP eating plan that is right for you. A dietitian can make sure that you get enough nutrition from this diet. What are tips for following this plan? Reading food labels  Check labels for hidden FODMAPs such as: ? High-fructose syrup. ? Honey. ? Agave. ? Natural fruit flavors. ? Onion or garlic powder.  Choose low-FODMAP foods that contain 3-4 grams of fiber per serving.  Check food labels for serving sizes. Eat only one serving at a time to make sure FODMAP levels stay low. Meal planning  Follow a low-FODMAP eating plan for up to 6 weeks, or as told by your health care provider or dietitian.  To follow the eating plan: 1. Eliminate high-FODMAP foods from your diet completely. 2. Gradually reintroduce high-FODMAP  foods into your diet one at a time. Most people should wait a few days after introducing one high-FODMAP food before they introduce the next high-FODMAP food. Your dietitian can recommend how quickly you may reintroduce foods. 3. Keep a daily record of what you eat and drink, and make note of any symptoms that you have after eating. 4. Review your daily record with a dietitian regularly. Your dietitian can help you identify which foods you can eat and which foods you should avoid. General tips  Drink enough fluid each day to keep your urine pale yellow.  Avoid processed foods. These often have added sugar and may be high in FODMAPs.  Avoid most dairy products, whole grains, and sweeteners.  Work with a dietitian to make sure you get enough fiber in your diet. Recommended foods Grains  Gluten-free grains, such as rice, oats, buckwheat, quinoa, corn, polenta, and millet. Gluten-free pasta, bread, or cereal. Rice noodles. Corn tortillas. Vegetables  Eggplant, zucchini, cucumber, peppers, green beans, Brussels sprouts, bean sprouts, lettuce, arugula, kale, Swiss chard, spinach, collard greens, bok choy, summer squash, potato, and tomato. Limited amounts of corn, carrot, and sweet potato. Green parts of scallions. Fruits  Bananas, oranges, lemons, limes, blueberries, raspberries, strawberries, grapes, cantaloupe, honeydew melon, kiwi, papaya, passion fruit, and pineapple. Limited amounts of dried cranberries, banana chips, and shredded coconut. Dairy  Lactose-free milk, yogurt, and kefir. Lactose-free cottage cheese and ice cream. Non-dairy milks, such as almond, coconut, hemp, and rice milk. Yogurts made of non-dairy milks. Limited amounts of goat cheese, brie, mozzarella, parmesan,  swiss, and other hard cheeses. Meats and other protein foods  Unseasoned beef, pork, poultry, or fish. Eggs. Berniece Salines. Tofu (firm) and tempeh. Limited amounts of nuts and seeds, such as almonds, walnuts, [place] nuts,  pecans, peanuts, pumpkin seeds, chia seeds, and sunflower seeds. Fats and oils  Butter-free spreads. Vegetable oils, such as olive, canola, and sunflower oil. Seasoning and other foods  Artificial sweeteners with names that do not end in "ol" such as aspartame, saccharine, and stevia. Maple syrup, white table sugar, raw sugar, brown sugar, and molasses. Fresh basil, coriander, parsley, rosemary, and thyme. Beverages  Water and mineral water. Sugar-sweetened soft drinks. Small amounts of orange juice or cranberry juice. Black and green tea. Most dry wines. Coffee. This may not be a complete list of low-FODMAP foods. Talk with your dietitian for more information. Foods to avoid Grains  Wheat, including kamut, durum, and semolina. Barley and bulgur. Couscous. Wheat-based cereals. Wheat noodles, bread, crackers, and pastries. Vegetables  Chicory root, artichoke, asparagus, cabbage, snow peas, sugar snap peas, mushrooms, and cauliflower. Onions, garlic, leeks, and the white part of scallions. Fruits  Fresh, dried, and juiced forms of apple, pear, watermelon, peach, plum, cherries, apricots, blackberries, boysenberries, figs, nectarines, and mango. Avocado. Dairy  Milk, yogurt, ice cream, and soft cheese. Cream and sour cream. Milk-based sauces. Custard. Meats and other protein foods  Fried or fatty meat. Sausage. Cashews and pistachios. Soybeans, baked beans, black beans, chickpeas, kidney beans, fava beans, navy beans, lentils, and split peas. Seasoning and other foods  Any sugar-free gum or candy. Foods that contain artificial sweeteners such as sorbitol, mannitol, isomalt, or xylitol. Foods that contain honey, high-fructose corn syrup, or agave. Bouillon, vegetable stock, beef stock, and chicken stock. Garlic and onion powder. Condiments made with onion, such as hummus, chutney, pickles, relish, salad dressing, and salsa. Tomato paste. Beverages  Chicory-based drinks. Coffee  substitutes. Chamomile tea. Fennel tea. Sweet or fortified wines such as port or sherry. Diet soft drinks made with isomalt, mannitol, maltitol, sorbitol, or xylitol. Apple, pear, and mango juice. Juices with high-fructose corn syrup. This may not be a complete list of high-FODMAP foods. Talk with your dietitian to discuss what dietary choices are best for you.  Summary  A low-FODMAP eating plan is a short-term diet that eliminates FODMAPs from your diet to help ease symptoms of certain bowel diseases.  The eating plan usually lasts up to 6 weeks. After that, high-FODMAP foods are restarted gradually, one at a time, so you can find out which may be causing symptoms.  A low-FODMAP eating plan can be complicated. It is best to work with a dietitian who has experience with this type of plan. This information is not intended to replace advice given to you by your health care provider. Make sure you discuss any questions you have with your health care provider. Document Released: 08/25/2016 Document Revised: 08/25/2016 Document Reviewed: 08/25/2016 Elsevier Interactive Patient Education  [employer].

## 2018-04-26 NOTE — Progress Notes (Signed)
Primary Care Physician:  Curlene Labrum, MD Primary GI:  Garfield Cornea, MD   Patient Location: Home  Provider Location: [facility] office  Reason for Phone Visit: Follow-up diarrhea, colonoscopy  Persons present on the phone encounter, with roles: Patient, myself (provider), Charma Igo, CMA (updated meds and allergies)  Total time (minutes) spent on medical discussion: 20 minutes  Due to COVID-19, visit was conducted using telephonic method (no video was available).  Visit was requested by patient.  Virtual Visit via Telephone only  I connected with Andrea Dickson on 04/26/18 at  9:30 AM EDT by telephone and verified that I am speaking with the correct person using two identifiers.   I discussed the limitations, risks, security and privacy concerns of performing an evaluation and management service by telephone and the availability of in person appointments. I also discussed with the patient that there may be a patient responsible charge related to this service. The patient expressed understanding and agreed to proceed.  Chief Complaint  Patient presents with  . Diarrhea    having to wear a pad all the time bc she does not know when she will have an episode. Episodes can be 2-3 times a day and then can go a day or 2 w/o BM  . Bloated    when she eats anything she "blows up"     HPI:   Patient is a pleasant 68 year old female who presents for telephone visit regarding diarrhea, follow-up colonoscopy.  She was seen back in December for chronic diarrhea.  Symptoms occurring intermittently over 1 year.  She had a colonoscopy back in January.  She had a couple of tubular adenomas removed.  Random colon biopsies were negative for microscopic colitis.  Also had a colonic lipoma.  Next colonoscopy planned for 5 years.  Intermittent diarrhea still happening. More good days then bad days however. Has a lot of urgency with incontinence.  She is most discouraged by having to wear a pad all  the time.  Sometimes she goes to urinate and will have smearing of stool in the pad.  She also wears pads when she goes out in case she has fecal urgency.  No nocturnal stools lately. Has rarely happened.  Loose stools associated with abdominal cramping in the lower abdomen. Goes away with BMs. No N/V. No heartburn on omeprazole. No unintentional weight loss.  Often has postprandial abdominal bloating.  Does not really matter what she eats.  She does note that when she has milk with her cereal in the morning she has fecal urgency.  She does eat ice cream and cheese quite a bit. Uses imodium when has to go out. A lot of times will take it after has a couple of BMs. Takes one. And it will stop the stools.    Current Outpatient Medications  Medication Sig Dispense Refill  . buPROPion (WELLBUTRIN XL) 150 MG 24 hr tablet Take 1 tablet (150 mg total) by mouth every morning. 30 tablet 2  . cetirizine (ZYRTEC) 10 MG tablet Take 10 mg by mouth daily.    . Cholecalciferol (VITAMIN D3) 50 MCG (2000 UT) TABS Take 1 tablet by mouth daily.    . diazepam (VALIUM) 10 MG tablet Take 1 tablet (10 mg total) by mouth 2 (two) times daily. 60 tablet 2  . estrogens, conjugated, (PREMARIN) 1.25 MG tablet Take 1.25 mg by mouth daily.     Marland Kitchen lithium 300 MG tablet Take 2 tablets (600 mg total) by  mouth at bedtime. 60 tablet 2  . naproxen sodium (ALEVE) 220 MG tablet Take 220 mg by mouth daily as needed (pain).     Marland Kitchen omeprazole (PRILOSEC) 20 MG capsule TAKE 2 Capsules BY MOUTH ONCE DAILY (Patient taking differently: Take 20 mg by mouth daily. ) 180 capsule 1   No current facility-administered medications for this visit.     Past Medical History:  Diagnosis Date  . Anxiety   . Bipolar 1 disorder ([place])   . Depression   . Essential tremor   . GERD (gastroesophageal reflux disease)   . Headache   . High cholesterol     Past Surgical History:  Procedure Laterality Date  . ABDOMINAL HYSTERECTOMY  1997  . BIOPSY   01/17/2018   Procedure: BIOPSY;  Surgeon: Daneil Dolin, MD;  [facility];  Service: Endoscopy;;  colon  . carpal tunnel Bilateral 1999, 06/2009  . COLONOSCOPY  2013   Dr. Britta Mccreedy: no colon polyps. quality of prep was fair. repeat colonoscopy in five years.   . COLONOSCOPY WITH PROPOFOL N/A 01/17/2018   Dr. Gala Romney: Colonic lipoma, 2 tubular adenomas removed.  Random colon biopsies negative.  Next colonoscopy 5 years.  Marland Kitchen POLYPECTOMY  01/17/2018   Procedure: POLYPECTOMY;  Surgeon: Daneil Dolin, MD;  [facility];  Service: Endoscopy;;  colon  . SKIN CANCER EXCISION  2007  . TONSILLECTOMY  1965  . ulner nerve  Left 06/2009   decompression    Family History  Problem Relation Age of Onset  . Bipolar disorder Brother   . Diabetes Brother   . Heart disease Brother   . Diabetes Father   . Heart attack Father   . Heart disease Father   . Diabetes Sister   . Diabetes Mother   . Brain cancer Mother   . Colon cancer Neg Hx     Social History   Socioeconomic History  . Marital status: Unknown    Spouse name: Not on file  . Number of children: 1  . Years of education: 24  . Highest education level: Not on file  Occupational History    Comment: employed at [employer]  . Financial resource strain: Not on file  . Food insecurity:    Worry: Not on file    Inability: Not on file  . Transportation needs:    Medical: Not on file    Non-medical: Not on file  Tobacco Use  . Smoking status: Current Every Day Smoker    Packs/day: 1.00    Years: 43.00    Pack years: 43.00    Types: Cigarettes  . Smokeless tobacco: Never Used  . Tobacco comment: smoking since 68yrs old.    Substance and Sexual Activity  . Alcohol use: No    Alcohol/week: 0.0 standard drinks  . Drug use: No  . Sexual activity: Not Currently  Lifestyle  . Physical activity:    Days per week: Not on file    Minutes per session: Not on file  . Stress: Not on file  Relationships   . Social connections:    Talks on phone: Not on file    Gets together: Not on file    Attends religious service: Not on file    Active member of club or organization: Not on file    Attends meetings of clubs or organizations: Not on file    Relationship status: Not on file  . Intimate partner violence:  Fear of current or ex partner: Not on file    Emotionally abused: Not on file    Physically abused: Not on file    Forced sexual activity: Not on file  Other Topics Concern  . Not on file  Social History Narrative   Lives alone.  Retired.  Education 12th grade.  One child.     Caffeine use- sodas, 2 daily      ROS:  General: Negative for anorexia, weight loss, fever, chills, fatigue, weakness. Eyes: Negative for vision changes.  ENT: Negative for hoarseness, difficulty swallowing , nasal congestion. CV: Negative for chest pain, angina, palpitations, dyspnea on exertion, peripheral edema.  Respiratory: Negative for dyspnea at rest, dyspnea on exertion, cough, sputum, wheezing.  GI: See history of present illness. GU:  Negative for dysuria, hematuria, urinary incontinence, urinary frequency, nocturnal urination.  MS: Negative for joint pain, low back pain.  Derm: Negative for rash or itching.  Neuro: Negative for weakness, abnormal sensation, seizure, frequent headaches, memory loss, confusion.  Psych: Negative for anxiety, depression, suicidal ideation, hallucinations.  Endo: Negative for unusual weight change.  Heme: Negative for bruising or bleeding. Allergy: Negative for rash or hives.   Observations/Objective: Patient sounds well on the phone.  Pleasant with positive affect.  No distress.  Assessment and Plan: Pleasant 68 year old female with intermittent chronic diarrhea for greater than a year.  Suspect functional/IBS.  Recent colonoscopy with random colon biopsies.  Unlikely infectious etiology given chronicity as well as the fact that she can go up a few days without  a bowel movement.  When she takes Imodium he generally will work.  She does have some abdominal cramping and bloating which may be related to foods that she is eating.  We will try an antispasmodic.  Limit high FODMAP foods.  I encouraged her to keep a stool diary with regards to number and consistency of stools, and foods that she is eating to see if she can find any common denominator that is increasing her symptoms such as dairy products.  We will have her come back in 6 months but she can call sooner if needed.  Follow Up Instructions:    I discussed the assessment and treatment plan with the patient. The patient was provided an opportunity to ask questions and all were answered. The patient agreed with the plan and demonstrated an understanding of the instructions. AVS mailed to patient's home address.   The patient was advised to call back or seek an in-person evaluation if the symptoms worsen or if the condition fails to improve as anticipated.  I provided 20 minutes of non-face-to-face time during this encounter.   Neil Crouch, PA-C

## 2018-04-27 ENCOUNTER — Encounter: Payer: Self-pay | Admitting: Gastroenterology

## 2018-05-02 ENCOUNTER — Ambulatory Visit (HOSPITAL_COMMUNITY): Payer: Medicare Other | Admitting: Psychiatry

## 2018-05-11 ENCOUNTER — Telehealth: Payer: Self-pay | Admitting: *Deleted

## 2018-05-12 MED ORDER — CIPROFLOXACIN HCL 500 MG PO TABS: 500.0000 mg | ORAL_TABLET | Freq: Two times a day (BID) | ORAL | 0 refills | Status: AC

## 2018-05-12 MED ORDER — METRONIDAZOLE 500 MG PO TABS: 500.0000 mg | ORAL_TABLET | Freq: Three times a day (TID) | ORAL | 0 refills | Status: AC

## 2018-05-12 NOTE — Telephone Encounter (Signed)
LMOVM for pt 

## 2018-05-12 NOTE — Telephone Encounter (Signed)
Spoke with patient and she states the bentyl is not helping. She took 2 yesterday plus imodium and was "sicker than a dog and I was doubled over". She hasn't took any today d/t stomach hurting very bad. Denies nausea/vomiting today. Last BM was yesterday, not straining, very loose stool, no blood in stool. ABD pain located left side "down low", last 2-3 days constant pain. She was going to go to the hospital the other day but d/t COVID-19 she did not.

## 2018-05-12 NOTE — Telephone Encounter (Signed)
Having diarrhea and cramps real bad. Down on the lower left side, having cramps. Doesn't matter what eats, still going through her.  Yesterday BM 3 stools, first two solid, the last one was very loose. No blood in the stool. Abdominal pain persistent for 3 days. No better with BMs.  No fever.  Has been on Bentyl since her visit 2 weeks ago.  Taking initially 1 daily and gradually work up to 2 daily.  Yesterday had to add back Imodium.  Almost went to the emergency department 2 nights ago due to the pain.  He is eliminated milk, ice cream from her diet.  We discussed options of CT and labs to evaluate her symptoms.  We also discussed empirical treatment with Cipro and Flagyl for possibility of infectious etiology.  She takes Care of her elderly father and prefers not to go for testing right now she does not have to due to risk of exposure and potentially bringing that to her parents.  I have sent in Rx for Cipro and Flagyl to [facility] in the evening.  She knows how to contact her providers after hours.  She will call next week with a progress report.

## 2018-05-17 ENCOUNTER — Telehealth: Payer: Self-pay | Admitting: *Deleted

## 2018-05-17 MED ORDER — ONDANSETRON HCL 4 MG PO TABS: 4.0000 mg | ORAL_TABLET | Freq: Three times a day (TID) | ORAL | 0 refills | Status: DC | PRN

## 2018-05-17 NOTE — Telephone Encounter (Signed)
If she feels like symptoms worse on cipro/flagyl, she can stop both of those. She should have completed 6-7 days by now.   Patient declined labs, stools, ct last week due to novel coronavirus concerns. It's difficult to further evaluate symptoms without obtaining stools and labs which is reason we empirically treated with cipro/flagyl.   I will send in Zofran to use for nausea.  Let's see if symptoms get better after stopping cipro/flagyl.  She can take imodium up to bid if needed for diarrhea.

## 2018-05-17 NOTE — Telephone Encounter (Signed)
Spoke with pt, Cipro and Flagyl has caused diarrhea to worsen. Pt is having diarrhea several times a day. Diarrhea isn't watery and very loose. Pt changed pads three times due to the diarrhea coming out uncontrollably. This morning pt has had 3 bowel movements. No matter what pt eats or drinks, pt stomach begins to hurt and cramp before diarrhea comes. Pt took one Imodium pill last week, Dicyclomine was also taken a couple times a day last week only. Pt also has c/o nausea. Pt was taking generic Zofran when she had it. Pt took her last Zofran 05/14/2018.

## 2018-05-17 NOTE — Telephone Encounter (Signed)
Pt notified of LSL recommendations. Pt will d/c medications and try Zofran up to bid daily if needed for nausea and take Imodium if needed.

## 2018-05-20 DIAGNOSIS — N3000 Acute cystitis without hematuria: Secondary | ICD-10-CM | POA: Diagnosis not present

## 2018-05-24 ENCOUNTER — Ambulatory Visit (INDEPENDENT_AMBULATORY_CARE_PROVIDER_SITE_OTHER): Payer: Medicare Other | Admitting: Psychiatry

## 2018-05-24 ENCOUNTER — Other Ambulatory Visit: Payer: Self-pay

## 2018-05-24 ENCOUNTER — Encounter (HOSPITAL_COMMUNITY): Payer: Self-pay | Admitting: Psychiatry

## 2018-05-24 DIAGNOSIS — F310 Bipolar disorder, current episode hypomanic: Secondary | ICD-10-CM

## 2018-05-24 MED ORDER — BUPROPION HCL ER (XL) 150 MG PO TB24: 150.0000 mg | ORAL_TABLET | ORAL | 2 refills | Status: DC

## 2018-05-24 MED ORDER — LITHIUM CARBONATE 300 MG PO TABS: 600.0000 mg | ORAL_TABLET | Freq: Every day | ORAL | 2 refills | Status: DC

## 2018-05-24 MED ORDER — DIAZEPAM 10 MG PO TABS: 10.0000 mg | ORAL_TABLET | Freq: Two times a day (BID) | ORAL | 2 refills | Status: DC
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
## 2018-06-09 DIAGNOSIS — K219 Gastro-esophageal reflux disease without esophagitis: Secondary | ICD-10-CM | POA: Diagnosis not present

## 2018-06-09 DIAGNOSIS — R7989 Other specified abnormal findings of blood chemistry: Secondary | ICD-10-CM | POA: Diagnosis not present

## 2018-06-09 DIAGNOSIS — E782 Mixed hyperlipidemia: Secondary | ICD-10-CM | POA: Diagnosis not present

## 2018-06-09 DIAGNOSIS — R739 Hyperglycemia, unspecified: Secondary | ICD-10-CM | POA: Diagnosis not present

## 2018-06-09 DIAGNOSIS — R5383 Other fatigue: Secondary | ICD-10-CM | POA: Diagnosis not present

## 2018-06-09 DIAGNOSIS — E559 Vitamin D deficiency, unspecified: Secondary | ICD-10-CM | POA: Diagnosis not present

## 2018-06-13 DIAGNOSIS — E1169 Type 2 diabetes mellitus with other specified complication: Secondary | ICD-10-CM | POA: Diagnosis not present

## 2018-06-13 DIAGNOSIS — E782 Mixed hyperlipidemia: Secondary | ICD-10-CM | POA: Diagnosis not present

## 2018-06-13 DIAGNOSIS — G44219 Episodic tension-type headache, not intractable: Secondary | ICD-10-CM | POA: Diagnosis not present

## 2018-06-22 DIAGNOSIS — J9811 Atelectasis: Secondary | ICD-10-CM | POA: Diagnosis not present

## 2018-06-22 DIAGNOSIS — R918 Other nonspecific abnormal finding of lung field: Secondary | ICD-10-CM | POA: Diagnosis not present

## 2018-06-22 DIAGNOSIS — R911 Solitary pulmonary nodule: Secondary | ICD-10-CM | POA: Diagnosis not present

## 2018-06-22 DIAGNOSIS — I700 Atherosclerosis of aorta: Secondary | ICD-10-CM | POA: Diagnosis not present

## 2018-07-05 ENCOUNTER — Other Ambulatory Visit: Payer: Self-pay

## 2018-07-05 ENCOUNTER — Telehealth: Payer: Self-pay | Admitting: Internal Medicine

## 2018-07-05 DIAGNOSIS — R197 Diarrhea, unspecified: Secondary | ICD-10-CM

## 2018-07-05 NOTE — Telephone Encounter (Signed)
[place] PATIENT, SAID SHE IS HAVING BAD STOMACH PROBLEMS AND WAS TOLD THAT SHE HAD IBS BY PER PCP.

## 2018-07-05 NOTE — Telephone Encounter (Signed)
Pt is going to have stool studies done tomorrow. Pt answered yes to all questions. Awaiting results once stool test is submitted.

## 2018-07-05 NOTE — Telephone Encounter (Signed)
Spoke with pt. No matter what the patient eats, she had loose stool/diarrhea 2-3 x daily. Pt wears a pad because sometimes she does know when her bowels are going to move. Pt was prescribed Dicyclomine and it didn't help her loose stool. Pt then started Imodium and it's not stopping the loose stool either. If was mentioned in pts chart that she declined stool studies previously. Pt was on Cipro for a UTI earlier in the month. Please advise in the absence of LSL.

## 2018-07-05 NOTE — Telephone Encounter (Signed)
I feel having a set of stool studies (especially in light of antibiotic exposure) would be helpful. After review of this, could consider low dose Viberzi. I reviewed chart and want to ensure the following:  1. Does she still have her gallbladder? 2. Denies alcohol? 3. Denies history of pancreatitis?   If yes to all above, we can trial Viberzi 75 mg po BID with food BUT only if we get stool studies first.

## 2018-07-07 NOTE — Telephone Encounter (Signed)
Agree with plan 

## 2018-07-12 DIAGNOSIS — E119 Type 2 diabetes mellitus without complications: Secondary | ICD-10-CM | POA: Diagnosis not present

## 2018-07-12 DIAGNOSIS — E7800 Pure hypercholesterolemia, unspecified: Secondary | ICD-10-CM | POA: Diagnosis not present

## 2018-07-21 ENCOUNTER — Other Ambulatory Visit: Payer: Self-pay | Admitting: Gastroenterology

## 2018-08-10 DIAGNOSIS — H00015 Hordeolum externum left lower eyelid: Secondary | ICD-10-CM | POA: Diagnosis not present

## 2018-08-12 DIAGNOSIS — E1169 Type 2 diabetes mellitus with other specified complication: Secondary | ICD-10-CM | POA: Diagnosis not present

## 2018-08-12 DIAGNOSIS — E782 Mixed hyperlipidemia: Secondary | ICD-10-CM | POA: Diagnosis not present

## 2018-08-12 DIAGNOSIS — I10 Essential (primary) hypertension: Secondary | ICD-10-CM | POA: Diagnosis not present

## 2018-08-24 ENCOUNTER — Ambulatory Visit (HOSPITAL_COMMUNITY): Payer: Medicare Other | Admitting: Psychiatry

## 2018-09-01 ENCOUNTER — Telehealth: Payer: Self-pay | Admitting: Internal Medicine

## 2018-09-01 DIAGNOSIS — R197 Diarrhea, unspecified: Secondary | ICD-10-CM

## 2018-09-01 MED ORDER — VIBERZI 75 MG PO TABS: 75.0000 mg | ORAL_TABLET | Freq: Two times a day (BID) | ORAL | 2 refills | Status: DC

## 2018-09-01 NOTE — Telephone Encounter (Signed)
Lab will only accept loose stool, cannot be formed. Does not have to be watery. She has been reluctant to go to lab or any studies due to Covid. Empiric trial with cipro/flagyl previously made symptoms worse.   She can take imodium 2mg  tid, hold for constipation.  OR  We can try low dose Viberzi 75mg  bid with food. Do not drink etoh.  NEEDS OV IN 2-3 WEEKS WITH ANY PROVIDER

## 2018-09-01 NOTE — Telephone Encounter (Signed)
Pt continues to have diarrhea. In 06/2018, pt was asked to get her stool studies done and she hasn't completed them because she was told by the lab that it needs to be water liquid only. Pt states today her stool is watery. When asked if she could do stool studies today, pt says she's at per parents house and can't complete the stool studies.  Pt is having diarrhea 4-5 times daily. Pt's bowels move with or without eating foods. Certain food trigger her diarrhea. Pt does have some abdominal pain/cramping on her lower lt side. cramping sometimes goes across pt's lower abdomen. Pt said her PCP said she had IBS and pt hasn't been told that by anyone else. Pt's father passed away last week and pt states that she's had diarrhea long before this. Pt takes Imodium as needed. Imodium helps some.pt sometimes takes the naseua medication and it helps her stomach with the nausea. No vomiting to report.

## 2018-09-01 NOTE — Addendum Note (Signed)
Addended by: Mahala Menghini on: 09/01/2018 02:56 PM   Modules accepted: Orders

## 2018-09-01 NOTE — Telephone Encounter (Signed)
Pt called asking for LSL. She needs something to stop her diarrhea. She uses [facility]. [phone]

## 2018-09-01 NOTE — Telephone Encounter (Signed)
Spoke with pt. She would like to try the Viberzi 75 mg bid with food. Pt states that she is going to try to complete the stool studies. Ov made for 09/14/2018 @ 8:00 AM with LSL.

## 2018-09-02 NOTE — Telephone Encounter (Signed)
Noted. Left a detailed message letting pt know her stool study orders have been updated. Pt is aware that orders will be mailed and if she would like to complete stool studies before she receives her orders in the mail, she can go to the lab and the orders will be in the system.

## 2018-09-02 NOTE — Telephone Encounter (Addendum)
Noted. RX was sent in yesterday.  I will need to order stools again because they had been deleted.

## 2018-09-02 NOTE — Addendum Note (Signed)
Addended by: Mahala Menghini on: 09/02/2018 08:00 AM   Modules accepted: Orders

## 2018-09-02 NOTE — Telephone Encounter (Signed)
Received a PA for Viberzi. PA was completed through [email or web address]. waiting on an approval or denial.

## 2018-09-09 DIAGNOSIS — K219 Gastro-esophageal reflux disease without esophagitis: Secondary | ICD-10-CM | POA: Diagnosis not present

## 2018-09-09 DIAGNOSIS — R739 Hyperglycemia, unspecified: Secondary | ICD-10-CM | POA: Diagnosis not present

## 2018-09-09 DIAGNOSIS — R5383 Other fatigue: Secondary | ICD-10-CM | POA: Diagnosis not present

## 2018-09-09 DIAGNOSIS — E782 Mixed hyperlipidemia: Secondary | ICD-10-CM | POA: Diagnosis not present

## 2018-09-09 DIAGNOSIS — R7989 Other specified abnormal findings of blood chemistry: Secondary | ICD-10-CM | POA: Diagnosis not present

## 2018-09-13 DIAGNOSIS — R7989 Other specified abnormal findings of blood chemistry: Secondary | ICD-10-CM | POA: Diagnosis not present

## 2018-09-13 DIAGNOSIS — E1169 Type 2 diabetes mellitus with other specified complication: Secondary | ICD-10-CM | POA: Diagnosis not present

## 2018-09-13 DIAGNOSIS — R911 Solitary pulmonary nodule: Secondary | ICD-10-CM | POA: Diagnosis not present

## 2018-09-13 DIAGNOSIS — Z23 Encounter for immunization: Secondary | ICD-10-CM | POA: Diagnosis not present

## 2018-09-14 ENCOUNTER — Encounter: Payer: Self-pay | Admitting: Gastroenterology

## 2018-09-14 ENCOUNTER — Other Ambulatory Visit: Payer: Self-pay

## 2018-09-14 ENCOUNTER — Ambulatory Visit (INDEPENDENT_AMBULATORY_CARE_PROVIDER_SITE_OTHER): Payer: Medicare Other | Admitting: Gastroenterology

## 2018-09-14 VITALS — BP 132/76 | HR 81 | Temp 98.2°F | Ht 66.0 in | Wt 162.0 lb

## 2018-09-14 DIAGNOSIS — R1032 Left lower quadrant pain: Secondary | ICD-10-CM | POA: Diagnosis not present

## 2018-09-14 DIAGNOSIS — K529 Noninfective gastroenteritis and colitis, unspecified: Secondary | ICD-10-CM

## 2018-09-14 DIAGNOSIS — K921 Melena: Secondary | ICD-10-CM | POA: Diagnosis not present

## 2018-09-14 LAB — CBC WITH DIFFERENTIAL/PLATELET
Absolute Monocytes: 230 cells/uL (ref 200–950)
Basophils Absolute: 29 cells/uL (ref 0–200)
Basophils Relative: 0.6 %
Eosinophils Absolute: 132 cells/uL (ref 15–500)
Eosinophils Relative: 2.7 %
HCT: 40.1 % (ref 35.0–45.0)
Hemoglobin: 13.7 g/dL (ref 11.7–15.5)
Lymphs Abs: 1181 cells/uL (ref 850–3900)
MCH: 30.9 pg (ref 27.0–33.0)
MCHC: 34.2 g/dL (ref 32.0–36.0)
MCV: 90.5 fL (ref 80.0–100.0)
MPV: 11.1 fL (ref 7.5–12.5)
Monocytes Relative: 4.7 %
Neutro Abs: 3327 cells/uL (ref 1500–7800)
Neutrophils Relative %: 67.9 %
Platelets: 121 10*3/uL — ABNORMAL LOW (ref 140–400)
RBC: 4.43 10*6/uL (ref 3.80–5.10)
RDW: 13.3 % (ref 11.0–15.0)
Total Lymphocyte: 24.1 %
WBC: 4.9 10*3/uL (ref 3.8–10.8)

## 2018-09-14 LAB — BASIC METABOLIC PANEL
BUN: 8 mg/dL (ref 7–25)
CO2: 27 mmol/L (ref 20–32)
Calcium: 9.3 mg/dL (ref 8.6–10.4)
Chloride: 107 mmol/L (ref 98–110)
Creat: 0.73 mg/dL (ref 0.50–0.99)
Glucose, Bld: 132 mg/dL (ref 65–139)
Potassium: 4.2 mmol/L (ref 3.5–5.3)
Sodium: 141 mmol/L (ref 135–146)

## 2018-09-14 MED ORDER — ONDANSETRON HCL 4 MG PO TABS: ORAL_TABLET | ORAL | 0 refills | Status: DC

## 2018-09-14 NOTE — Assessment & Plan Note (Signed)
Chronic intermittent diarrhea associated with abdominal cramping, work-up including colonoscopy with random colon biopsies as outlined.  Suspect IBS-D.  Recently some improvement with dicyclomine.  Insurance would not pay for [employer].  Suspect GI symptoms somewhat exacerbated in the setting of her father's illness, stress, metformin use.  Things seem to have settled down.  Encouraged dicyclomine up to 4 times a day as needed for abdominal cramping and diarrhea.

## 2018-09-14 NOTE — Assessment & Plan Note (Signed)
Left lower quadrant pain seems to be unrelated to her bowel function.  Persisting now for several months.  Previously declined CT because of COVID-19 and her father's illness.  At this point would pursue CT abdomen pelvis with contrast to rule out diverticulitis, occult malignancy.

## 2018-09-14 NOTE — Assessment & Plan Note (Signed)
Reports 1 week history of black stools.  Doubt true melena.  Will check hemoglobin to be on the safe side.  If she develops any weakness, lightheadedness, fatigue she will let us know or go to the ED.

## 2018-09-14 NOTE — Patient Instructions (Signed)
No PA needed for CT abd/pelvis per [employer] website. 

## 2018-09-14 NOTE — Patient Instructions (Signed)
1. Please complete labs. 2. CT scan of your abdomen to evaluate your pain.  3. Try dicyclomine up to four times daily for LLQ pain, diarrhea, abdominal cramping.

## 2018-09-14 NOTE — Progress Notes (Signed)
Primary Care Physician: Curlene Labrum, MD  Primary Gastroenterologist:  Garfield Cornea, MD   Chief Complaint  Patient presents with   Diarrhea    last episode 1 week ago. Bentyl helped but ran out about 1 week ago. Could not get viberzi    HPI: Andrea Dickson is a 68 y.o. female here for follow-up of diarrhea.  She was seen during telemedicine visit back in April.  She has a history of chronic intermittent diarrhea for over a year.  Colonoscopy in January, couple of tubular adenomas removed.  Random colon biopsies were negative for microscopic colitis.  She had a colonic lipoma.  Next colonoscopy planned for 5 years.  History of fecal urgency with incontinence at times.  Wears protective pads.  Associated with abdominal cramping, pain goes away with bowel movements.  No nocturnal symptoms.  Some fecal urgency after consuming milk.  I prescribed her dicyclomine back in April.  She tried initially but didn't feel like it really helped.  Because of persisting symptoms and left lower quadrant pain we empirically treated her for possible diverticulitis.  She most went to the ED due to the pain.  6 to 7 days into April and Flagyl she felt like her symptoms were worse so we had her stop the medication.  She continued to decline labs, stools, CT scan due to COVID-19 concerns.  She called in recently with recurrent diarrhea, father recently passed away.  We have ordered stool studies (C. difficile) a couple times on her this year but she has not completed.  We prescribed Viberzi but her insurance would not cover it.  She took some of her sister's Bentyl and felt like it helped.  Heartburn well controlled. Constant nausea though. Sister has the same bowel issues.  Last loose stool 1 week ago.  Took dicyclomine just a couple of times.  She notes her stools have been black for a week.  When she is having diarrhea they were black as well.  Denies Pepto or any other bismuth products.  No red blood  per rectum.  She has frequent left lower quadrant pain which seems to be unrelated to her bowel function.  Happens about every day.  Not better with BM.  She has abdominal cramping preceding bowel movements, improves after bowel movements.  Nausea but no vomiting.no fatigue, lightheadedness, dizziness.  Zofran takes edge off of her lower abdominal pain. does not take zofran every day, but a few times per week.   She wonders if her GI symptoms are worse and starting metformin a few months ago.  She was on 500 mg twice daily but this recently moved back down to once daily.  She was under a lot of stress over the last 6 weeks.  Father ultimately passed away 2 weeks ago.  Current Outpatient Medications  Medication Sig Dispense Refill   buPROPion (WELLBUTRIN XL) 150 MG 24 hr tablet Take 1 tablet (150 mg total) by mouth every morning. 30 tablet 2   cetirizine (ZYRTEC) 10 MG tablet Take 10 mg by mouth daily.     Cholecalciferol (VITAMIN D3) 50 MCG (2000 UT) TABS Take 1 tablet by mouth daily.     diazepam (VALIUM) 10 MG tablet Take 1 tablet (10 mg total) by mouth 2 (two) times daily. 60 tablet 2   estrogens, conjugated, (PREMARIN) 1.25 MG tablet Take 1.25 mg by mouth daily.      lithium 300 MG tablet Take 2 tablets (600 mg total)  by mouth at bedtime. 60 tablet 2   metFORMIN (GLUCOPHAGE) 500 MG tablet Take 500 mg by mouth daily with breakfast.     naproxen sodium (ALEVE) 220 MG tablet Take 220 mg by mouth daily as needed (pain).      omeprazole (PRILOSEC) 20 MG capsule TAKE 2 Capsules BY MOUTH ONCE DAILY (Patient taking differently: Take 20 mg by mouth daily. ) 180 capsule 1   ondansetron (ZOFRAN) 4 MG tablet TAKE 1 TABLET BY MOUTH EVERY 8 HOURS AS NEEDED FOR NAUSEA AND VOMITING 20 tablet 0   dicyclomine (BENTYL) 10 MG capsule Take one capsule every morning to prevent loose stool. You may take up to three times a day as needed for abdominal cramps and loose stool. (Patient not taking: Reported on  09/14/2018) 90 capsule 1   No current facility-administered medications for this visit.     Allergies as of 09/14/2018 - Review Complete 09/14/2018  Allergen Reaction Noted   Pramipexole Other (See Comments) 04/24/2015   Propranolol  05/14/2017   Pollen extract Other (See Comments) 04/24/2015    ROS:  General: Negative for anorexia, weight loss, fever, chills, fatigue, weakness. ENT: Negative for hoarseness, difficulty swallowing , nasal congestion. CV: Negative for chest pain, angina, palpitations, dyspnea on exertion, peripheral edema.  Respiratory: Negative for dyspnea at rest, dyspnea on exertion, cough, sputum, wheezing.  GI: See history of present illness. GU:  Negative for dysuria, hematuria, urinary incontinence, urinary frequency, nocturnal urination.  Endo: Negative for unusual weight change.    Physical Examination:   BP 132/76    Pulse 81    Temp 98.2 F (36.8 C) (Oral)    Ht 5\' 6"  (1.676 m)    Wt 162 lb (73.5 kg)    BMI 26.15 kg/m   General: Well-nourished, well-developed in no acute distress.  Eyes: No icterus. Mouth: Oropharyngeal mucosa moist and pink , no lesions erythema or exudate. Lungs: Clear to auscultation bilaterally.  Heart: Regular rate and rhythm, no murmurs rubs or gallops.  Abdomen: Bowel sounds are normal,nondistended, no hepatosplenomegaly or masses, no abdominal bruits or hernia , no rebound or guarding.  Moderate left lower quadrant tenderness Extremities: No lower extremity edema. No clubbing or deformities. Neuro: Alert and oriented x 4   Skin: Warm and dry, no jaundice.   Psych: Alert and cooperative, normal mood and affect.   Imaging Studies: No results found.

## 2018-09-18 ENCOUNTER — Other Ambulatory Visit (HOSPITAL_COMMUNITY): Payer: Self-pay | Admitting: Psychiatry

## 2018-09-28 ENCOUNTER — Ambulatory Visit (HOSPITAL_COMMUNITY): Payer: Medicare Other

## 2018-09-30 NOTE — Telephone Encounter (Signed)
PA denial from is scanned in pt's chart for Viberiz. Pt is aware and followed up with provider. Provider is aware that Viberzi wasn't covered under insurance.

## 2018-10-03 ENCOUNTER — Ambulatory Visit (HOSPITAL_COMMUNITY)
Admission: RE | Admit: 2018-10-03 | Discharge: 2018-10-03 | Disposition: A | Discharge status: Home / Self Care | Payer: Medicare Other | Source: Ambulatory Visit | Attending: Gastroenterology | Admitting: Gastroenterology

## 2018-10-03 ENCOUNTER — Other Ambulatory Visit: Payer: Self-pay

## 2018-10-03 DIAGNOSIS — R1032 Left lower quadrant pain: Secondary | ICD-10-CM | POA: Diagnosis not present

## 2018-10-03 MED ORDER — IOHEXOL 300 MG/ML  SOLN
100.0000 mL | Freq: Once | INTRAMUSCULAR | Status: AC | PRN
  Administered 2018-10-03: 100 mL via INTRAVENOUS

## 2018-10-11 ENCOUNTER — Telehealth: Payer: Self-pay | Admitting: *Deleted

## 2018-10-11 NOTE — Telephone Encounter (Signed)
Wants CT results.  [phone]

## 2018-10-11 NOTE — Telephone Encounter (Signed)
Pt is inquiring about CT results.  

## 2018-10-11 NOTE — Telephone Encounter (Signed)
Noted  

## 2018-10-11 NOTE — Telephone Encounter (Signed)
See CT result note

## 2018-10-12 ENCOUNTER — Ambulatory Visit (INDEPENDENT_AMBULATORY_CARE_PROVIDER_SITE_OTHER): Payer: Medicare Other | Admitting: Psychiatry

## 2018-10-12 ENCOUNTER — Encounter (HOSPITAL_COMMUNITY): Payer: Self-pay | Admitting: Psychiatry

## 2018-10-12 ENCOUNTER — Other Ambulatory Visit: Payer: Self-pay

## 2018-10-12 DIAGNOSIS — I10 Essential (primary) hypertension: Secondary | ICD-10-CM | POA: Diagnosis not present

## 2018-10-12 DIAGNOSIS — F310 Bipolar disorder, current episode hypomanic: Secondary | ICD-10-CM

## 2018-10-12 DIAGNOSIS — E782 Mixed hyperlipidemia: Secondary | ICD-10-CM | POA: Diagnosis not present

## 2018-10-12 MED ORDER — LITHIUM CARBONATE 300 MG PO TABS: 600.0000 mg | ORAL_TABLET | Freq: Every day | ORAL | 2 refills | Status: DC

## 2018-10-12 MED ORDER — DIAZEPAM 2 MG PO TABS: 2.0000 mg | ORAL_TABLET | Freq: Every day | ORAL | 2 refills | Status: DC

## 2018-10-12 MED ORDER — PROPRANOLOL HCL ER 60 MG PO CP24: 60.0000 mg | ORAL_CAPSULE | Freq: Every day | ORAL | 2 refills | Status: DC

## 2018-10-12 MED ORDER — BUPROPION HCL ER (XL) 150 MG PO TB24: ORAL_TABLET | ORAL | 2 refills | Status: DC

## 2018-10-12 MED ORDER — DIAZEPAM 10 MG PO TABS: 10.0000 mg | ORAL_TABLET | Freq: Every day | ORAL | 2 refills | Status: DC
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
## 2018-10-13 ENCOUNTER — Other Ambulatory Visit: Payer: Self-pay | Admitting: Gastroenterology

## 2018-10-13 DIAGNOSIS — K7460 Unspecified cirrhosis of liver: Secondary | ICD-10-CM

## 2018-10-14 LAB — LITHIUM LEVEL: Lithium Lvl: 0.4 mmol/L — ABNORMAL LOW (ref 0.6–1.2)

## 2018-10-14 LAB — TSH: TSH: 2.27 mIU/L (ref 0.40–4.50)

## 2018-10-14 NOTE — Progress Notes (Signed)
ON RECALL AND SCHEDULED FOR FOLLOW UP  °

## 2018-10-19 DIAGNOSIS — R300 Dysuria: Secondary | ICD-10-CM | POA: Diagnosis not present

## 2018-10-19 DIAGNOSIS — K766 Portal hypertension: Secondary | ICD-10-CM | POA: Diagnosis not present

## 2018-10-19 DIAGNOSIS — K7460 Unspecified cirrhosis of liver: Secondary | ICD-10-CM | POA: Diagnosis not present

## 2018-10-21 LAB — COMPREHENSIVE METABOLIC PANEL
AG Ratio: 1.9 (calc) (ref 1.0–2.5)
ALT: 13 U/L (ref 6–29)
AST: 25 U/L (ref 10–35)
Albumin: 4.3 g/dL (ref 3.6–5.1)
Alkaline phosphatase (APISO): 111 U/L (ref 37–153)
BUN: 12 mg/dL (ref 7–25)
CO2: 26 mmol/L (ref 20–32)
Calcium: 9.7 mg/dL (ref 8.6–10.4)
Chloride: 108 mmol/L (ref 98–110)
Creat: 0.8 mg/dL (ref 0.50–0.99)
Globulin: 2.3 g/dL (calc) (ref 1.9–3.7)
Glucose, Bld: 200 mg/dL — ABNORMAL HIGH (ref 65–139)
Potassium: 4 mmol/L (ref 3.5–5.3)
Sodium: 141 mmol/L (ref 135–146)
Total Bilirubin: 0.6 mg/dL (ref 0.2–1.2)
Total Protein: 6.6 g/dL (ref 6.1–8.1)

## 2018-10-21 LAB — HEPATITIS B SURFACE ANTIBODY,QUALITATIVE: Hep B S Ab: NONREACTIVE

## 2018-10-21 LAB — PROTIME-INR
INR: 1
Prothrombin Time: 10.4 s (ref 9.0–11.5)

## 2018-10-21 LAB — IGG, IGA, IGM
IgG (Immunoglobin G), Serum: 710 mg/dL (ref 600–1540)
IgM, Serum: 110 mg/dL (ref 50–300)
Immunoglobulin A: 254 mg/dL (ref 70–320)

## 2018-10-21 LAB — HEPATITIS A ANTIBODY, TOTAL: Hepatitis A AB,Total: NONREACTIVE

## 2018-10-21 LAB — IRON,TIBC AND FERRITIN PANEL
%SAT: 24 % (calc) (ref 16–45)
Ferritin: 61 ng/mL (ref 16–288)
Iron: 84 ug/dL (ref 45–160)
TIBC: 347 mcg/dL (calc) (ref 250–450)

## 2018-10-21 LAB — HEPATITIS B SURFACE ANTIGEN: Hepatitis B Surface Ag: NONREACTIVE

## 2018-10-21 LAB — ANTI-SMOOTH MUSCLE ANTIBODY, IGG: Actin (Smooth Muscle) Antibody (IGG): <20 U (ref <20)

## 2018-10-21 LAB — MITOCHONDRIAL ANTIBODIES: Mitochondrial M2 Ab, IgG: <=20 U

## 2018-10-21 LAB — HEPATITIS C ANTIBODY
Hepatitis C Ab: NONREACTIVE
SIGNAL TO CUT-OFF: 0.02 (ref <1.00)

## 2018-10-21 LAB — ANA: Anti Nuclear Antibody (ANA): NEGATIVE

## 2018-10-21 LAB — HEPATITIS B CORE ANTIBODY, TOTAL: Hep B Core Total Ab: NONREACTIVE

## 2018-10-25 ENCOUNTER — Other Ambulatory Visit: Payer: Self-pay

## 2018-10-25 DIAGNOSIS — K7460 Unspecified cirrhosis of liver: Secondary | ICD-10-CM

## 2018-10-27 ENCOUNTER — Ambulatory Visit: Payer: Medicare Other | Admitting: Gastroenterology

## 2018-10-27 ENCOUNTER — Ambulatory Visit: Payer: Self-pay | Admitting: Gastroenterology

## 2018-11-23 ENCOUNTER — Telehealth: Payer: Self-pay

## 2018-11-23 NOTE — Telephone Encounter (Signed)
Noted  

## 2018-11-23 NOTE — Telephone Encounter (Signed)
Received appeal paperwork from [facility] for viberzi, which was sent in to the pharmacy,  for the pt in June.  I called the pt, she stated the last medication that LSL gave her was working pretty good, so she didn't want to try the medication anyway. I advised the patient that I would not be filling out the appeal form if she didn't want the medicine and she stated that was fine.

## 2018-12-05 DIAGNOSIS — J209 Acute bronchitis, unspecified: Secondary | ICD-10-CM | POA: Diagnosis not present

## 2018-12-12 DIAGNOSIS — E1165 Type 2 diabetes mellitus with hyperglycemia: Secondary | ICD-10-CM | POA: Diagnosis not present

## 2018-12-12 DIAGNOSIS — E782 Mixed hyperlipidemia: Secondary | ICD-10-CM | POA: Diagnosis not present

## 2018-12-19 DIAGNOSIS — E1169 Type 2 diabetes mellitus with other specified complication: Secondary | ICD-10-CM | POA: Diagnosis not present

## 2018-12-19 DIAGNOSIS — R7989 Other specified abnormal findings of blood chemistry: Secondary | ICD-10-CM | POA: Diagnosis not present

## 2018-12-19 DIAGNOSIS — K219 Gastro-esophageal reflux disease without esophagitis: Secondary | ICD-10-CM | POA: Diagnosis not present

## 2018-12-19 DIAGNOSIS — R739 Hyperglycemia, unspecified: Secondary | ICD-10-CM | POA: Diagnosis not present

## 2018-12-19 DIAGNOSIS — K7460 Unspecified cirrhosis of liver: Secondary | ICD-10-CM | POA: Diagnosis not present

## 2018-12-22 DIAGNOSIS — K766 Portal hypertension: Secondary | ICD-10-CM | POA: Diagnosis not present

## 2018-12-22 DIAGNOSIS — E1169 Type 2 diabetes mellitus with other specified complication: Secondary | ICD-10-CM | POA: Diagnosis not present

## 2018-12-22 DIAGNOSIS — Z23 Encounter for immunization: Secondary | ICD-10-CM | POA: Diagnosis not present

## 2018-12-22 DIAGNOSIS — R7989 Other specified abnormal findings of blood chemistry: Secondary | ICD-10-CM | POA: Diagnosis not present

## 2018-12-22 DIAGNOSIS — K7460 Unspecified cirrhosis of liver: Secondary | ICD-10-CM | POA: Diagnosis not present

## 2018-12-28 DIAGNOSIS — Z719 Counseling, unspecified: Secondary | ICD-10-CM | POA: Diagnosis not present

## 2019-01-10 DIAGNOSIS — R109 Unspecified abdominal pain: Secondary | ICD-10-CM | POA: Diagnosis not present

## 2019-01-11 ENCOUNTER — Ambulatory Visit (INDEPENDENT_AMBULATORY_CARE_PROVIDER_SITE_OTHER): Payer: Medicare Other | Admitting: Psychiatry

## 2019-01-11 ENCOUNTER — Other Ambulatory Visit: Payer: Self-pay

## 2019-01-11 ENCOUNTER — Encounter (HOSPITAL_COMMUNITY): Payer: Self-pay | Admitting: Psychiatry

## 2019-01-11 DIAGNOSIS — F310 Bipolar disorder, current episode hypomanic: Secondary | ICD-10-CM | POA: Diagnosis not present

## 2019-01-11 MED ORDER — BUPROPION HCL ER (XL) 150 MG PO TB24: ORAL_TABLET | ORAL | 2 refills | Status: DC

## 2019-01-11 MED ORDER — DIAZEPAM 2 MG PO TABS: 2.0000 mg | ORAL_TABLET | Freq: Every day | ORAL | 2 refills | Status: DC

## 2019-01-11 MED ORDER — LITHIUM CARBONATE 300 MG PO TABS: 600.0000 mg | ORAL_TABLET | Freq: Every day | ORAL | 2 refills | Status: DC

## 2019-01-11 MED ORDER — PROPRANOLOL HCL ER 60 MG PO CP24: 60.0000 mg | ORAL_CAPSULE | Freq: Every day | ORAL | 2 refills | Status: DC

## 2019-01-11 MED ORDER — DIAZEPAM 10 MG PO TABS: 10.0000 mg | ORAL_TABLET | Freq: Every day | ORAL | 2 refills | Status: DC
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
## 2019-01-26 ENCOUNTER — Telehealth: Payer: Self-pay

## 2019-01-26 NOTE — Telephone Encounter (Signed)
VM received today 01/26/2019. Lmom, waiting on a return call from pt.

## 2019-01-26 NOTE — Telephone Encounter (Signed)
Pt returned call. Pt called with c/o lt side pain that goes around to her back and nausea. Pt is taking zofran to control the nausea. Pt said the pain was abdominal pain and states it's more her lt side/back. Pt saw her PCP recently for the pain and was told it was dealing with her musculoskeletal system. Pt was given some muscle relaxer's for her pain. Pt is taking the Bentyl bid and is aware that per her last ov, she could take it up to 4 times a day. Pt states that the pain get to a 8 with 10 being the highest. Pts bowels are moving a few times during the day and are a mix between formed/diarrhea.

## 2019-01-27 NOTE — Telephone Encounter (Signed)
If severe pain she should go to er. Otherwise she needs an ov for reevaluation. Last seen in 09/2018.

## 2019-01-27 NOTE — Telephone Encounter (Signed)
Noted. Apt was moved up to 01/30/2019 @ 3:30 pm with [place]. If pts symptoms worsen, pt is advised to go to the EG.

## 2019-01-29 NOTE — Progress Notes (Signed)
Referring Provider: Curlene Labrum, MD Primary Care Physician:  Curlene Labrum, MD Primary GI Physician: Dr. Gala Romney  Chief Complaint  Patient presents with  . Abdominal Pain    "left side and goes all the way up into left side of back"  . Nausea    no vomiting    HPI:   Andrea Dickson is a 69 y.o. female presenting today with a history of chronic intermittent diarrhea with associated urgency and incontinence at times.  Last colonoscopy in January 2020 with a couple of tubular adenomas removed.  Random colon biopsies were negative for microscopic colitis.  She had a colonic lipoma.  Next colonoscopy due in 2025.  Last seen in our office on 09/14/2018 for LLQ abdominal pain present for several months unrelated to bowel function, melena x1 week although doubted true melena, and chronic intermittent diarrhea with associated abdominal cramping with recent improvement with dicyclomine.  Plans were to continue dicyclomine up to 4 times daily, obtain CBC and BMP, and CT A/P with contrast to rule out diverticulitis or occult malignancy as etiology of LLQ pain.  Labs on 09/14/2018 with CBC normal except for platelets slightly low at 121.  BMP normal.  CT abdomen and pelvis on 10/11/2018: Impression: 1.  No acute findings in the abdomen and pelvis, specifically no evidence of acute diverticulitis with only minimal diverticulosis. 2.  Signs of nodular liver with findings of liver disease/cirrhosis and portal hypertension. 3.  Question of rectosigmoid thickening at the rectosigmoid junction versus peristaltic activity.  CT findings were discussed with Dr. Gala Romney.  He looked back at TCS report from January 2020 and felt this area was well seen and did not recommend follow-up at that time.  Additional labs were obtained to evaluate new finding of cirrhosis.  Hepatitis A, B, C negative.  LFTs normal.  No autoimmune hepatitis, PBC, iron overload.  MELD score 6.  Order for Hep A and B vaccine  mailed.  Patient called on 01/26/2019 with complaint of left-sided abdominal pain that goes around to her back with nausea.  Reported seeing her PCP recently and was told she was dealing with musculoskeletal pain and was given a muscle relaxer.  Patient reported pain getting up to an 8/10.  She is advised here to the ER if her pain was severe.  Otherwise, would schedule an office visit for reevaluation.  Today:  LLQ pain has never resolved. Present for almost 1 year. Pain is present daily. Constant. Will fluctuate in severity. Radiates from LLQ to upper abdomen, up through her chest, and into her upper back. Has hurt so bad it has doubled her over. Feels nauseated with severe pain but without vomiting. Went to PCP. She was started on a muscle relaxer which helped for a day or two but pain came right back.  Severity ranges from 3/10 up to an 8/10. Hasn't identified triggers.  Not associated with bowel movements or eating.  When bending over pain worsens. Having a BM daily. Stools start out formed but before she finishes, they are mushy. Typically one a day. Will occasionally have flares of diarrhea. Saturday had diarrhea. Had about 10 stools. Stools were mushy initially and watery thereafter. Didn't have any bentyl with her to take. She was at her moms house. Diarrhea resolved by the evening. Typically takes bentyl every morning, sometimes 2 a day. Bentyl doesn't help with LLQ pain. No blood in the stool. No weight loss. No black stools.    Reflux symptoms  well controlled on Omeprazole 20 mg.  No urinary symptoms.  Denies urinary burning, pain with urination, urinary frequency, hematuria.  Cirrhosis:  Reports being overweight for several years in the past. She is a diabetic. Doesn't drink alcohol. Drank a little alcohol many years ago. This was occasional/social. No prior EGD. Has not had Hep A or Hep B vaccine. Needs prior authorization for Hep A. Has prescription we mailed to her, she just hasn't gotten  them yet. No jaundice, confusion, dark urine, bruising, or swelling in LE or abdomen.    Past Medical History:  Diagnosis Date  . Anxiety   . Bipolar 1 disorder ([place])   . Depression   . Diabetes ([place])   . Essential tremor   . GERD (gastroesophageal reflux disease)   . Headache   . High cholesterol     Past Surgical History:  Procedure Laterality Date  . ABDOMINAL HYSTERECTOMY  1997  . BIOPSY  01/17/2018   Procedure: BIOPSY;  Surgeon: Daneil Dolin, MD;  [facility];  Service: Endoscopy;;  colon  . carpal tunnel Bilateral 1999, 06/2009  . COLONOSCOPY  2013   Dr. Britta Mccreedy: no colon polyps. quality of prep was fair. repeat colonoscopy in five years.   . COLONOSCOPY WITH PROPOFOL N/A 01/17/2018   Dr. Gala Romney: Colonic lipoma, 2 tubular adenomas removed.  Random colon biopsies negative.  Next colonoscopy 5 years.  Marland Kitchen POLYPECTOMY  01/17/2018   Procedure: POLYPECTOMY;  Surgeon: Daneil Dolin, MD;  [facility];  Service: Endoscopy;;  colon  . SKIN CANCER EXCISION  2007  . TONSILLECTOMY  1965  . ulner nerve  Left 06/2009   decompression    Current Outpatient Medications  Medication Sig Dispense Refill  . buPROPion (WELLBUTRIN XL) 150 MG 24 hr tablet TAKE 1 TABLET BY MOUTH ONCE DAILY IN THE MORNING 30 tablet 2  . cetirizine (ZYRTEC) 10 MG tablet Take 10 mg by mouth daily.    . Cholecalciferol (VITAMIN D3) 50 MCG (2000 UT) TABS Take 1 tablet by mouth daily.    . diazepam (VALIUM) 10 MG tablet Take 1 tablet (10 mg total) by mouth at bedtime. 60 tablet 2  . diazepam (VALIUM) 2 MG tablet Take 1 tablet (2 mg total) by mouth daily. 30 tablet 2  . dicyclomine (BENTYL) 10 MG capsule Take one capsule every morning to prevent loose stool. You may take up to three times a day as needed for abdominal cramps and loose stool. 90 capsule 1  . estrogens, conjugated, (PREMARIN) 1.25 MG tablet Take 1.25 mg by mouth daily.     Marland Kitchen lithium 300 MG tablet Take 2 tablets (600 mg total) by mouth  at bedtime. 60 tablet 2  . metFORMIN (GLUCOPHAGE) 500 MG tablet Take 500 mg by mouth daily with breakfast.    . methocarbamol (ROBAXIN) 750 MG tablet Take 750 mg by mouth 4 (four) times daily. Taking at bedtime    . omeprazole (PRILOSEC) 20 MG capsule TAKE 2 Capsules BY MOUTH ONCE DAILY (Patient taking differently: Take 20 mg by mouth daily. ) 180 capsule 1  . ondansetron (ZOFRAN) 4 MG tablet TAKE 1 TABLET BY MOUTH EVERY 8 HOURS AS NEEDED FOR NAUSEA AND VOMITING 20 tablet 0  . propranolol ER (INDERAL LA) 60 MG 24 hr capsule Take 1 capsule (60 mg total) by mouth daily. 30 capsule 2  . rosuvastatin (CRESTOR) 10 MG tablet Take 10 mg by mouth daily. Taking on Monday and Friday.    Marland Kitchen  naproxen sodium (ALEVE) 220 MG tablet Take 220 mg by mouth daily as needed (pain).      No current facility-administered medications for this visit.    Allergies as of 01/30/2019 - Review Complete 01/30/2019  Allergen Reaction Noted  . Pramipexole Other (See Comments) 04/24/2015  . Propranolol  05/14/2017  . Pollen extract Other (See Comments) 04/24/2015    Family History  Problem Relation Age of Onset  . Bipolar disorder Brother   . Diabetes Brother   . Heart disease Brother   . Diabetes Father   . Heart attack Father   . Heart disease Father   . Diabetes Sister   . Diabetes Mother   . Brain cancer Mother   . Colon cancer Neg Hx     Social History   Socioeconomic History  . Marital status: Divorced    Spouse name: Not on file  . Number of children: 1  . Years of education: 13  . Highest education level: Not on file  Occupational History    Comment: employed at [employer]  . Smoking status: Current Every Day Smoker    Packs/day: 1.00    Years: 43.00    Pack years: 43.00    Types: Cigarettes  . Smokeless tobacco: Never Used  . Tobacco comment: smoking since 69yrs old.    Substance and Sexual Activity  . Alcohol use: No    Alcohol/week: 0.0 standard drinks  . Drug use: No   . Sexual activity: Not Currently  Other Topics Concern  . Not on file  Social History Narrative   Lives alone.  Retired.  Education 12th grade.  One child.     Caffeine use- sodas, 2 daily   Social Determinants of Health   Financial Resource Strain:   . Difficulty of Paying Living Expenses: Not on file  Food Insecurity:   . Worried About Charity fundraiser in the Last Year: Not on file  . Ran Out of Food in the Last Year: Not on file  Transportation Needs:   . Lack of Transportation (Medical): Not on file  . Lack of Transportation (Non-Medical): Not on file  Physical Activity:   . Days of Exercise per Week: Not on file  . Minutes of Exercise per Session: Not on file  Stress:   . Feeling of Stress : Not on file  Social Connections:   . Frequency of Communication with Friends and Family: Not on file  . Frequency of Social Gatherings with Friends and Family: Not on file  . Attends Religious Services: Not on file  . Active Member of Clubs or Organizations: Not on file  . Attends Archivist Meetings: Not on file  . Marital Status: Not on file    Review of Systems: Gen: Denies fever, chills, lightheadedness, dizziness, presyncope, or syncope. CV: Denies chest pain or heart palpitations. Resp: Denies shortness of breath or cough. GI: See HPI. Derm: Denies rash Heme: See HPI  Physical Exam: BP 131/72   Pulse 77   Temp (!) 97.3 F (36.3 C) (Oral)   Ht 5\' 6"  (1.676 m)   Wt 167 lb 12.8 oz (76.1 kg)   BMI 27.08 kg/m  General:   Alert and oriented. No distress noted. Pleasant and cooperative.  Head:  Normocephalic and atraumatic. Eyes:  Conjuctiva clear without scleral icterus. Heart:  S1, S2 present without murmurs appreciated. Lungs:  Clear to auscultation bilaterally. No wheezes, rales, or rhonchi. No distress.  Abdomen:  +BS,  soft, and non-distended. Minimal tenderness to palpation in the epigastric area. Significant tenderness to moderate palpation in the LLQ.  No rebound or guarding. No HSM or masses noted.  Back: Tenderness along left lower back,  along left lower rib cage, and around left flank.  Msk:  Symmetrical without gross deformities. Normal posture. Extremities:  Without edema. Neurologic:  Alert and  oriented x4 Psych:  Normal mood and affect.

## 2019-01-30 ENCOUNTER — Other Ambulatory Visit: Payer: Self-pay

## 2019-01-30 ENCOUNTER — Encounter: Payer: Self-pay | Admitting: Gastroenterology

## 2019-01-30 ENCOUNTER — Ambulatory Visit (INDEPENDENT_AMBULATORY_CARE_PROVIDER_SITE_OTHER): Payer: Medicare Other | Admitting: Gastroenterology

## 2019-01-30 VITALS — BP 131/72 | HR 77 | Temp 97.3°F | Ht 66.0 in | Wt 167.8 lb

## 2019-01-30 DIAGNOSIS — K529 Noninfective gastroenteritis and colitis, unspecified: Secondary | ICD-10-CM | POA: Diagnosis not present

## 2019-01-30 DIAGNOSIS — K7460 Unspecified cirrhosis of liver: Secondary | ICD-10-CM | POA: Insufficient documentation

## 2019-01-30 DIAGNOSIS — R1032 Left lower quadrant pain: Secondary | ICD-10-CM | POA: Diagnosis not present

## 2019-01-30 DIAGNOSIS — K219 Gastro-esophageal reflux disease without esophagitis: Secondary | ICD-10-CM

## 2019-01-30 NOTE — Assessment & Plan Note (Addendum)
Persistent LLQ abdominal pain x1 year.  Pain radiates to her upper abdomen into her chest and left back at times.  Pain is present at some severity daily.  Waxes and wanes between 3/10-8/10.  She denies any significant change in symptoms since we saw her in September 2020, maybe little worse.  No identified triggers.  Not associated with bowel movements or eating.  She does state that bending over will worsen her LLQ and back pain.  She is on Bentyl for chronic intermittent diarrhea that does not affect her abdominal pain.  PCP started her on Robaxin which helped for couple days but pain returned.  She has continued taking Robaxin every night.  CT abdomen pelvis on 10/11/2018 with no acute findings in the abdomen or pelvis, specifically no evidence of acute diverticulitis with only minimal diverticulosis.  There was question of rectosigmoid thickening versus peristaltic activity.  Also with new diagnosis of cirrhosis.  Case was discussed with Dr. Gala Romney who did not feel repeat colonoscopy was necessary at that time as her colonoscopy was up-to-date in January 2020.  She denies bright red blood per rectum, melena, or unintentional weight loss.  No urinary symptoms.  She does have nausea without vomiting with episodes of severe pain.  Exam with mild tenderness palpation in the epigastric area, significant tenderness to moderate palpation in the left lower quadrant.  Also with tenderness in her left lower back as well as along her lower left rib cage and around her left flank.  Etiology of her abdominal pain is not clear.  Query whether she has smoldering diverticulitis that was not picked up on prior CT as diverticulosis was mentioned on CT.  Nephrolithiasis is also on the differential. I do suspect there is a MSK component to her pain as she reports some improvement in pain with Robaxin initially and worsening pain with bending over.  Repeat CT abdomen and pelvis with contrast to evaluate for diverticulitis and any  change in noted bowel wall thickening on prior CT.  If this is unrevealing, could consider empirically treating her for diverticulitis.  Would likely need to discuss the role of early interval TCS with Dr. Gala Romney again depending on findings.  She was also advised to follow-up with her PCP for possible MSK component. Continue Zofran as needed. Further recommendations to follow.

## 2019-01-30 NOTE — Assessment & Plan Note (Addendum)
New diagnosis of cirrhosis identified on CT abdomen and pelvis on 10/11/2018.  Impression with signs of nodular liver with findings of liver disease/cirrhosis and portal hypertension.  CBC on 09/14/2018 with normal hemoglobin but slightly low platelets at 121.  Kidney function and electrolytes within normal limits.  Additional labs completed on 10/19/2018 with hepatitis A, B, and C negative.  No immunity to hepatitis A or B.  LFTs were normal.  No autoimmune hepatitis, PBC, or iron overload.  Meld score 6.  Patient denies any significant history of alcohol use.  She does report being much more overweight for several years in the past.  She is also diabetic and has high cholesterol.  Suspect cirrhosis is likely secondary to NASH. She is without any signs or symptoms of advanced liver disease.  Needs EGD for esophageal varices screening.  Proceed with EGD with propofol with Dr. Gala Romney in the near future. The risks, benefits, and alternatives have been discussed in detail with patient. They have stated understanding and desire to proceed.  Propofol due to polypharmacy and history of bipolar disorder. We will obtain AFP now.  Otherwise, she is due for repeat labs in April 2021. Ultrasound due in March 2021. She has prescription for hepatitis A and B that was mailed to her after her labs were completed in October 2020.  She has not completed these yet and states she needs a prior authorization for hepatitis A.  I am discussing this with clinical staff.  Otherwise, she was advised to go ahead and get hepatitis B vaccine. Advised to follow a low-sodium diet, no more than 2 g daily. No more than 2 g of Tylenol daily. Follow-up after EGD.

## 2019-01-30 NOTE — Assessment & Plan Note (Signed)
Chronic intermittent diarrhea with prior work-up including colonoscopy with random colon biopsies that were negative in January 2020.  Suspect IBS-D.  Symptoms have been fairly well controlled on Bentyl once to twice daily.  Typically will have 1 BM daily.  She does still have occasional flares of frequent loose to watery diarrhea.  Last episode was 3 days ago.  Reported having about 10 stools throughout the day with symptoms resolved by the evening and no diarrhea since. Of note, she did not have Bentyl with her to take with diarrhea started. No bright red blood per rectum, melena, or unintentional weight loss.  She was advised to continue Bentyl up to 4 times daily as needed for abdominal cramping and diarrhea.

## 2019-01-30 NOTE — Patient Instructions (Addendum)
Please have labs and CT completed.   We will call with results and further recommendations.  We will get you scheduled for an EGD with Dr. Gala Romney in the near future for esophageal varices surveillance.   Continue omeprazole 20 mg daily.  Continue using Zofran as needed.  Continue Bentyl daily, up to 4 times daily as needed.  Follow-up with PCP regarding abdominal pain/back pain as well as there may be a slow skeletal component.  Follow a low-sodium diet.  Less than 2 g daily.  No more than 2 g tylenol daily.   Have vaccines completed for Hep A and Hep B. Will ask nursing staff about prior authorization for Hep A.   We will plan to see you back in the office after your upper endoscopy.  Aliene Altes, PA-C [facility]

## 2019-01-30 NOTE — Assessment & Plan Note (Signed)
Well-controlled on omeprazole 20 mg daily.  Continue omeprazole.

## 2019-01-31 ENCOUNTER — Telehealth: Payer: Self-pay

## 2019-01-31 ENCOUNTER — Other Ambulatory Visit: Payer: Self-pay

## 2019-01-31 DIAGNOSIS — R1032 Left lower quadrant pain: Secondary | ICD-10-CM | POA: Diagnosis not present

## 2019-01-31 DIAGNOSIS — M791 Myalgia, unspecified site: Secondary | ICD-10-CM | POA: Diagnosis not present

## 2019-01-31 DIAGNOSIS — K7460 Unspecified cirrhosis of liver: Secondary | ICD-10-CM | POA: Diagnosis not present

## 2019-01-31 DIAGNOSIS — K766 Portal hypertension: Secondary | ICD-10-CM | POA: Diagnosis not present

## 2019-01-31 DIAGNOSIS — E1169 Type 2 diabetes mellitus with other specified complication: Secondary | ICD-10-CM | POA: Diagnosis not present

## 2019-01-31 DIAGNOSIS — R7989 Other specified abnormal findings of blood chemistry: Secondary | ICD-10-CM | POA: Diagnosis not present

## 2019-01-31 NOTE — Telephone Encounter (Signed)
No PA needed for CT abd/pelvis w/contrast per [facility] website.   CT scheduled for 02/02/19 at 12:30pm, arrive at 12:15pm. NPO 4 hours prior to test. Pick up contrast prior to appt.  Called and informed pt of CT appt. Also advised her to have lab work done prior to CT.

## 2019-01-31 NOTE — Telephone Encounter (Signed)
PA for EGD submitted via [facility] website. Case approved. PA# [id number], valid 04/06/19-07/05/19.

## 2019-02-01 ENCOUNTER — Telehealth: Payer: Self-pay

## 2019-02-01 ENCOUNTER — Telehealth: Payer: Self-pay | Admitting: Internal Medicine

## 2019-02-01 LAB — BASIC METABOLIC PANEL
BUN: 14 mg/dL (ref 7–25)
CO2: 24 mmol/L (ref 20–32)
Calcium: 9.4 mg/dL (ref 8.6–10.4)
Chloride: 104 mmol/L (ref 98–110)
Creat: 0.92 mg/dL (ref 0.50–0.99)
Glucose, Bld: 317 mg/dL — ABNORMAL HIGH (ref 65–139)
Potassium: 4.6 mmol/L (ref 3.5–5.3)
Sodium: 135 mmol/L (ref 135–146)

## 2019-02-01 LAB — AFP TUMOR MARKER: AFP-Tumor Marker: 1.2 ng/mL

## 2019-02-01 NOTE — Telephone Encounter (Signed)
Noted. Speaking with pts PCP's nurse as directed about labs. Pt is aware .

## 2019-02-01 NOTE — Telephone Encounter (Signed)
[phone]  Patient called and said that she had eaten before they took her lab last time and that is probably why it was so high,  Please call if you have any questions

## 2019-02-01 NOTE — Telephone Encounter (Signed)
Left a message for pts PCP's nurse to call back in reference to [place] vaccinations. Per pt at her apt, a PA is needed. Will discuss with the nurse.

## 2019-02-01 NOTE — Progress Notes (Signed)
Kidney function and electrolytes are within normal limits. Glucose is quite elevated at 317. She has diabetes and is on metformin. She needs to contact her PCP and follow-up with them ASAP on diabetes management.   Andrea Dickson, can you fax results to PCP?

## 2019-02-02 ENCOUNTER — Ambulatory Visit (HOSPITAL_COMMUNITY)
Admission: RE | Admit: 2019-02-02 | Discharge: 2019-02-02 | Disposition: A | Discharge status: Home / Self Care | Payer: Medicare Other | Source: Ambulatory Visit | Attending: Gastroenterology | Admitting: Gastroenterology

## 2019-02-02 ENCOUNTER — Other Ambulatory Visit: Payer: Self-pay

## 2019-02-02 DIAGNOSIS — K7460 Unspecified cirrhosis of liver: Secondary | ICD-10-CM | POA: Diagnosis not present

## 2019-02-02 DIAGNOSIS — R1032 Left lower quadrant pain: Secondary | ICD-10-CM | POA: Insufficient documentation

## 2019-02-02 MED ORDER — IOHEXOL 300 MG/ML  SOLN
100.0000 mL | Freq: Once | INTRAMUSCULAR | Status: AC | PRN
  Administered 2019-02-02: 100 mL via INTRAVENOUS

## 2019-02-03 NOTE — Progress Notes (Signed)
CT without any findings to explain her LLQ abdominal pain. Discussed this with Dr. Gala Romney. We have evaluated GI etiologies extensively without identification of cause. As she has pain in her back on the same side and has had some improvement with robaxin initially, she needs to follow-up with her PCP to have her back/spine evaluated.   Andrea Dickson, please forward CT report to PCP.

## 2019-02-06 DIAGNOSIS — Z23 Encounter for immunization: Secondary | ICD-10-CM | POA: Diagnosis not present

## 2019-02-27 ENCOUNTER — Telehealth: Payer: Self-pay | Admitting: Internal Medicine

## 2019-02-27 NOTE — Telephone Encounter (Signed)
RECALL FOR ULTRASOUND 

## 2019-02-27 NOTE — Telephone Encounter (Signed)
Letter mailed

## 2019-03-07 DIAGNOSIS — Z23 Encounter for immunization: Secondary | ICD-10-CM | POA: Diagnosis not present

## 2019-03-08 ENCOUNTER — Ambulatory Visit (INDEPENDENT_AMBULATORY_CARE_PROVIDER_SITE_OTHER): Payer: Medicare Other | Admitting: Psychiatry

## 2019-03-08 ENCOUNTER — Ambulatory Visit: Payer: Medicare Other | Admitting: Gastroenterology

## 2019-03-08 ENCOUNTER — Other Ambulatory Visit: Payer: Self-pay

## 2019-03-08 ENCOUNTER — Encounter (HOSPITAL_COMMUNITY): Payer: Self-pay | Admitting: Psychiatry

## 2019-03-08 DIAGNOSIS — F310 Bipolar disorder, current episode hypomanic: Secondary | ICD-10-CM | POA: Diagnosis not present

## 2019-03-08 MED ORDER — LITHIUM CARBONATE 300 MG PO TABS: 600.0000 mg | ORAL_TABLET | Freq: Every day | ORAL | 2 refills | Status: DC

## 2019-03-08 MED ORDER — BUPROPION HCL ER (XL) 150 MG PO TB24: ORAL_TABLET | ORAL | 2 refills | Status: DC

## 2019-03-08 MED ORDER — DIAZEPAM 10 MG PO TABS: 10.0000 mg | ORAL_TABLET | Freq: Every day | ORAL | 2 refills | Status: DC

## 2019-03-08 MED ORDER — PROPRANOLOL HCL ER 60 MG PO CP24: 60.0000 mg | ORAL_CAPSULE | Freq: Every day | ORAL | 2 refills | Status: DC

## 2019-03-08 MED ORDER — DIAZEPAM 2 MG PO TABS: 2.0000 mg | ORAL_TABLET | Freq: Every day | ORAL | 2 refills | Status: DC
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
## 2019-03-10 DIAGNOSIS — E7849 Other hyperlipidemia: Secondary | ICD-10-CM | POA: Diagnosis not present

## 2019-03-10 DIAGNOSIS — I10 Essential (primary) hypertension: Secondary | ICD-10-CM | POA: Diagnosis not present

## 2019-03-14 ENCOUNTER — Other Ambulatory Visit: Payer: Self-pay | Admitting: Gastroenterology

## 2019-03-14 ENCOUNTER — Telehealth: Payer: Self-pay

## 2019-03-14 DIAGNOSIS — K529 Noninfective gastroenteritis and colitis, unspecified: Secondary | ICD-10-CM

## 2019-03-14 MED ORDER — DICYCLOMINE HCL 10 MG PO CAPS: ORAL_CAPSULE | ORAL | 1 refills | Status: DC

## 2019-03-14 NOTE — Telephone Encounter (Signed)
Refill request received from [facility] for Dicyclomine 10 mg, #90, take 1 capsule po in the morning to prevent loose stool. May take up to 3 times daily.

## 2019-03-14 NOTE — Telephone Encounter (Signed)
Noted  

## 2019-03-14 NOTE — Telephone Encounter (Signed)
Rx sent 

## 2019-03-16 DIAGNOSIS — E1169 Type 2 diabetes mellitus with other specified complication: Secondary | ICD-10-CM | POA: Diagnosis not present

## 2019-03-16 DIAGNOSIS — K219 Gastro-esophageal reflux disease without esophagitis: Secondary | ICD-10-CM | POA: Diagnosis not present

## 2019-03-16 DIAGNOSIS — E782 Mixed hyperlipidemia: Secondary | ICD-10-CM | POA: Diagnosis not present

## 2019-03-20 DIAGNOSIS — K766 Portal hypertension: Secondary | ICD-10-CM | POA: Diagnosis not present

## 2019-03-20 DIAGNOSIS — Z0001 Encounter for general adult medical examination with abnormal findings: Secondary | ICD-10-CM | POA: Diagnosis not present

## 2019-03-20 DIAGNOSIS — E1169 Type 2 diabetes mellitus with other specified complication: Secondary | ICD-10-CM | POA: Diagnosis not present

## 2019-03-20 DIAGNOSIS — K7460 Unspecified cirrhosis of liver: Secondary | ICD-10-CM | POA: Diagnosis not present

## 2019-04-03 NOTE — Patient Instructions (Signed)
ADIA MCGAREY  04/03/2019     @PREFPERIOPPHARMACY @   Your procedure is scheduled on  04/06/2019 .  Report to [facility] at  1000  A.M.  Call this number if you have problems the morning of surgery:  [phone]   Remember:  Follow the diet instructions given to you by Dr Roseanne Kaufman office.                     Take these medicines the morning of surgery with A SIP OF WATER  Bupropion, valium(if needed), bentyl, omeprazole, zofran, propranolol. DO NOT take any medications for diabetes the morning of your procedure.    Do not wear jewelry, make-up or nail polish.  Do not wear lotions, powders, or perfumes. Please wear deodorant and brush your teeth.  Do not shave 48 hours prior to surgery.  Men may shave face and neck.  Do not bring valuables to the hospital.  [facility] is not responsible for any belongings or valuables.  Contacts, dentures or bridgework may not be worn into surgery.  Leave your suitcase in the car.  After surgery it may be brought to your room.  For patients admitted to the hospital, discharge time will be determined by your treatment team.  Patients discharged the day of surgery will not be allowed to drive home.   Name and phone number of your driver:   family Special instructions:  DO NOT smoke the day of your procedure. Please read over the following fact sheets that you were given. Anesthesia Post-op Instructions and Care and Recovery After Surgery       Upper Endoscopy, Adult, Care After This sheet gives you information about how to care for yourself after your procedure. Your health care provider may also give you more specific instructions. If you have problems or questions, contact your health care provider. What can I expect after the procedure? After the procedure, it is common to have:  A sore throat.  Mild stomach pain or discomfort.  Bloating.  Nausea. Follow these instructions at home:   Follow instructions from your  health care provider about what to eat or drink after your procedure.  Return to your normal activities as told by your health care provider. Ask your health care provider what activities are safe for you.  Take over-the-counter and prescription medicines only as told by your health care provider.  Do not drive for 24 hours if you were given a sedative during your procedure.  Keep all follow-up visits as told by your health care provider. This is important. Contact a health care provider if you have:  A sore throat that lasts longer than one day.  Trouble swallowing. Get help right away if:  You vomit blood or your vomit looks like coffee grounds.  You have: ? A fever. ? Bloody, black, or tarry stools. ? A severe sore throat or you cannot swallow. ? Difficulty breathing. ? Severe pain in your chest or abdomen. Summary  After the procedure, it is common to have a sore throat, mild stomach discomfort, bloating, and nausea.  Do not drive for 24 hours if you were given a sedative during the procedure.  Follow instructions from your health care provider about what to eat or drink after your procedure.  Return to your normal activities as told by your health care provider. This information is not intended to replace advice given to you by your health care provider. Make sure  you discuss any questions you have with your health care provider. Document Revised: 06/22/2017 Document Reviewed: 05/31/2017 Elsevier Patient Education  2020 [place] After These instructions provide you with information about caring for yourself after your procedure. Your health care provider may also give you more specific instructions. Your treatment has been planned according to current medical practices, but problems sometimes occur. Call your health care provider if you have any problems or questions after your procedure. What can I expect after the procedure? After  your procedure, you may:  Feel sleepy for several hours.  Feel clumsy and have poor balance for several hours.  Feel forgetful about what happened after the procedure.  Have poor judgment for several hours.  Feel nauseous or vomit.  Have a sore throat if you had a breathing tube during the procedure. Follow these instructions at home: For at least 24 hours after the procedure:      Have a responsible adult stay with you. It is important to have someone help care for you until you are awake and alert.  Rest as needed.  Do not: ? Participate in activities in which you could fall or become injured. ? Drive. ? Use heavy machinery. ? Drink alcohol. ? Take sleeping pills or medicines that cause drowsiness. ? Make important decisions or sign legal documents. ? Take care of children on your own. Eating and drinking  Follow the diet that is recommended by your health care provider.  If you vomit, drink water, juice, or soup when you can drink without vomiting.  Make sure you have little or no nausea before eating solid foods. General instructions  Take over-the-counter and prescription medicines only as told by your health care provider.  If you have sleep apnea, surgery and certain medicines can increase your risk for breathing problems. Follow instructions from your health care provider about wearing your sleep device: ? Anytime you are sleeping, including during daytime naps. ? While taking prescription pain medicines, sleeping medicines, or medicines that make you drowsy.  If you smoke, do not smoke without supervision.  Keep all follow-up visits as told by your health care provider. This is important. Contact a health care provider if:  You keep feeling nauseous or you keep vomiting.  You feel light-headed.  You develop a rash.  You have a fever. Get help right away if:  You have trouble breathing. Summary  For several hours after your procedure, you may  feel sleepy and have poor judgment.  Have a responsible adult stay with you for at least 24 hours or until you are awake and alert. This information is not intended to replace advice given to you by your health care provider. Make sure you discuss any questions you have with your health care provider. Document Revised: 03/29/2017 Document Reviewed: 04/21/2015 Elsevier Patient Education  [place].

## 2019-04-04 ENCOUNTER — Encounter (HOSPITAL_COMMUNITY)
Admission: RE | Admit: 2019-04-04 | Discharge: 2019-04-04 | Disposition: A | Discharge status: Home / Self Care | Payer: Medicare Other | Source: Ambulatory Visit | Attending: Internal Medicine | Admitting: Internal Medicine

## 2019-04-04 ENCOUNTER — Other Ambulatory Visit: Payer: Self-pay

## 2019-04-04 ENCOUNTER — Encounter (HOSPITAL_COMMUNITY): Payer: Self-pay

## 2019-04-04 ENCOUNTER — Other Ambulatory Visit (HOSPITAL_COMMUNITY)
Admission: RE | Admit: 2019-04-04 | Discharge: 2019-04-04 | Disposition: A | Discharge status: Home / Self Care | Payer: Medicare Other | Source: Ambulatory Visit | Attending: Internal Medicine | Admitting: Internal Medicine

## 2019-04-04 DIAGNOSIS — J45909 Unspecified asthma, uncomplicated: Secondary | ICD-10-CM | POA: Diagnosis not present

## 2019-04-04 DIAGNOSIS — F17210 Nicotine dependence, cigarettes, uncomplicated: Secondary | ICD-10-CM | POA: Diagnosis not present

## 2019-04-04 DIAGNOSIS — K3189 Other diseases of stomach and duodenum: Secondary | ICD-10-CM | POA: Diagnosis not present

## 2019-04-04 DIAGNOSIS — K766 Portal hypertension: Secondary | ICD-10-CM | POA: Diagnosis not present

## 2019-04-04 DIAGNOSIS — Z8249 Family history of ischemic heart disease and other diseases of the circulatory system: Secondary | ICD-10-CM | POA: Diagnosis not present

## 2019-04-04 DIAGNOSIS — Z20822 Contact with and (suspected) exposure to covid-19: Secondary | ICD-10-CM | POA: Diagnosis not present

## 2019-04-04 DIAGNOSIS — Z13810 Encounter for screening for upper gastrointestinal disorder: Secondary | ICD-10-CM | POA: Diagnosis not present

## 2019-04-04 DIAGNOSIS — Z833 Family history of diabetes mellitus: Secondary | ICD-10-CM | POA: Diagnosis not present

## 2019-04-04 DIAGNOSIS — Z85828 Personal history of other malignant neoplasm of skin: Secondary | ICD-10-CM | POA: Diagnosis not present

## 2019-04-04 DIAGNOSIS — E119 Type 2 diabetes mellitus without complications: Secondary | ICD-10-CM | POA: Diagnosis not present

## 2019-04-04 DIAGNOSIS — Z79890 Hormone replacement therapy (postmenopausal): Secondary | ICD-10-CM | POA: Diagnosis not present

## 2019-04-04 DIAGNOSIS — K5900 Constipation, unspecified: Secondary | ICD-10-CM | POA: Diagnosis not present

## 2019-04-04 DIAGNOSIS — Z79899 Other long term (current) drug therapy: Secondary | ICD-10-CM | POA: Diagnosis not present

## 2019-04-04 DIAGNOSIS — K219 Gastro-esophageal reflux disease without esophagitis: Secondary | ICD-10-CM | POA: Diagnosis not present

## 2019-04-04 DIAGNOSIS — K7460 Unspecified cirrhosis of liver: Secondary | ICD-10-CM | POA: Diagnosis not present

## 2019-04-04 DIAGNOSIS — F419 Anxiety disorder, unspecified: Secondary | ICD-10-CM | POA: Diagnosis not present

## 2019-04-04 DIAGNOSIS — Z7984 Long term (current) use of oral hypoglycemic drugs: Secondary | ICD-10-CM | POA: Diagnosis not present

## 2019-04-04 DIAGNOSIS — F319 Bipolar disorder, unspecified: Secondary | ICD-10-CM | POA: Diagnosis not present

## 2019-04-04 DIAGNOSIS — G250 Essential tremor: Secondary | ICD-10-CM | POA: Diagnosis not present

## 2019-04-04 HISTORY — DX: Unspecified asthma, uncomplicated: J45.909

## 2019-04-04 LAB — COMPREHENSIVE METABOLIC PANEL
ALT: 22 U/L (ref 0–44)
AST: 41 U/L (ref 15–41)
Albumin: 4.1 g/dL (ref 3.5–5.0)
Alkaline Phosphatase: 124 U/L (ref 38–126)
Anion gap: 9 (ref 5–15)
BUN: 11 mg/dL (ref 8–23)
CO2: 23 mmol/L (ref 22–32)
Calcium: 9.2 mg/dL (ref 8.9–10.3)
Chloride: 105 mmol/L (ref 98–111)
Creatinine, Ser: 0.79 mg/dL (ref 0.44–1.00)
GFR calc Af Amer: >60 mL/min (ref >60)
GFR calc non Af Amer: >60 mL/min (ref >60)
Glucose, Bld: 224 mg/dL — ABNORMAL HIGH (ref 70–99)
Potassium: 4.3 mmol/L (ref 3.5–5.1)
Sodium: 137 mmol/L (ref 135–145)
Total Bilirubin: 0.9 mg/dL (ref 0.3–1.2)
Total Protein: 7.5 g/dL (ref 6.5–8.1)

## 2019-04-04 LAB — CBC WITH DIFFERENTIAL/PLATELET
Abs Immature Granulocytes: 0.01 10*3/uL (ref 0.00–0.07)
Basophils Absolute: 0 10*3/uL (ref 0.0–0.1)
Basophils Relative: 1 %
Eosinophils Absolute: 0.1 10*3/uL (ref 0.0–0.5)
Eosinophils Relative: 3 %
HCT: 41.5 % (ref 36.0–46.0)
Hemoglobin: 13.5 g/dL (ref 12.0–15.0)
Immature Granulocytes: 0 %
Lymphocytes Relative: 27 %
Lymphs Abs: 1.4 10*3/uL (ref 0.7–4.0)
MCH: 29.6 pg (ref 26.0–34.0)
MCHC: 32.5 g/dL (ref 30.0–36.0)
MCV: 91 fL (ref 80.0–100.0)
Monocytes Absolute: 0.3 10*3/uL (ref 0.1–1.0)
Monocytes Relative: 5 %
Neutro Abs: 3.3 10*3/uL (ref 1.7–7.7)
Neutrophils Relative %: 64 %
Platelets: 110 10*3/uL — ABNORMAL LOW (ref 150–400)
RBC: 4.56 MIL/uL (ref 3.87–5.11)
RDW: 13 % (ref 11.5–15.5)
WBC: 5 10*3/uL (ref 4.0–10.5)
nRBC: 0 % (ref 0.0–0.2)

## 2019-04-04 LAB — PROTIME-INR
INR: 1.1 (ref 0.8–1.2)
Prothrombin Time: 14.3 seconds (ref 11.4–15.2)

## 2019-04-04 LAB — SARS CORONAVIRUS 2 (TAT 6-24 HRS): SARS Coronavirus 2: NEGATIVE

## 2019-04-06 ENCOUNTER — Encounter (HOSPITAL_COMMUNITY)
Admission: RE | Disposition: A | Discharge status: Home / Self Care | Payer: Self-pay | Source: Home / Self Care | Attending: Internal Medicine

## 2019-04-06 ENCOUNTER — Ambulatory Visit (HOSPITAL_COMMUNITY)
Admission: RE | Admit: 2019-04-06 | Discharge: 2019-04-06 | Disposition: A | Discharge status: Home / Self Care | Payer: Medicare Other | Attending: Internal Medicine | Admitting: Internal Medicine

## 2019-04-06 ENCOUNTER — Other Ambulatory Visit: Payer: Self-pay

## 2019-04-06 ENCOUNTER — Ambulatory Visit (HOSPITAL_COMMUNITY): Payer: Medicare Other | Admitting: Anesthesiology

## 2019-04-06 ENCOUNTER — Encounter (HOSPITAL_COMMUNITY): Payer: Self-pay | Admitting: Internal Medicine

## 2019-04-06 DIAGNOSIS — E119 Type 2 diabetes mellitus without complications: Secondary | ICD-10-CM | POA: Insufficient documentation

## 2019-04-06 DIAGNOSIS — Z8249 Family history of ischemic heart disease and other diseases of the circulatory system: Secondary | ICD-10-CM | POA: Insufficient documentation

## 2019-04-06 DIAGNOSIS — Z20822 Contact with and (suspected) exposure to covid-19: Secondary | ICD-10-CM | POA: Insufficient documentation

## 2019-04-06 DIAGNOSIS — K766 Portal hypertension: Secondary | ICD-10-CM | POA: Diagnosis not present

## 2019-04-06 DIAGNOSIS — K7460 Unspecified cirrhosis of liver: Secondary | ICD-10-CM | POA: Diagnosis not present

## 2019-04-06 DIAGNOSIS — K5900 Constipation, unspecified: Secondary | ICD-10-CM | POA: Insufficient documentation

## 2019-04-06 DIAGNOSIS — K3189 Other diseases of stomach and duodenum: Secondary | ICD-10-CM | POA: Insufficient documentation

## 2019-04-06 DIAGNOSIS — F319 Bipolar disorder, unspecified: Secondary | ICD-10-CM | POA: Insufficient documentation

## 2019-04-06 DIAGNOSIS — R109 Unspecified abdominal pain: Secondary | ICD-10-CM

## 2019-04-06 DIAGNOSIS — Z79890 Hormone replacement therapy (postmenopausal): Secondary | ICD-10-CM | POA: Insufficient documentation

## 2019-04-06 DIAGNOSIS — Z7984 Long term (current) use of oral hypoglycemic drugs: Secondary | ICD-10-CM | POA: Diagnosis not present

## 2019-04-06 DIAGNOSIS — Z13810 Encounter for screening for upper gastrointestinal disorder: Secondary | ICD-10-CM | POA: Diagnosis not present

## 2019-04-06 DIAGNOSIS — F419 Anxiety disorder, unspecified: Secondary | ICD-10-CM | POA: Insufficient documentation

## 2019-04-06 DIAGNOSIS — Z79899 Other long term (current) drug therapy: Secondary | ICD-10-CM | POA: Insufficient documentation

## 2019-04-06 DIAGNOSIS — K219 Gastro-esophageal reflux disease without esophagitis: Secondary | ICD-10-CM | POA: Diagnosis not present

## 2019-04-06 DIAGNOSIS — Z833 Family history of diabetes mellitus: Secondary | ICD-10-CM | POA: Diagnosis not present

## 2019-04-06 DIAGNOSIS — J45909 Unspecified asthma, uncomplicated: Secondary | ICD-10-CM | POA: Insufficient documentation

## 2019-04-06 DIAGNOSIS — Z85828 Personal history of other malignant neoplasm of skin: Secondary | ICD-10-CM | POA: Insufficient documentation

## 2019-04-06 DIAGNOSIS — G250 Essential tremor: Secondary | ICD-10-CM | POA: Diagnosis not present

## 2019-04-06 DIAGNOSIS — F17210 Nicotine dependence, cigarettes, uncomplicated: Secondary | ICD-10-CM | POA: Insufficient documentation

## 2019-04-06 HISTORY — PX: ESOPHAGOGASTRODUODENOSCOPY (EGD) WITH PROPOFOL: SHX5813

## 2019-04-06 LAB — GLUCOSE, CAPILLARY: Glucose-Capillary: 147 mg/dL — ABNORMAL HIGH (ref 70–99)

## 2019-04-06 SURGERY — ESOPHAGOGASTRODUODENOSCOPY (EGD) WITH PROPOFOL
Anesthesia: General

## 2019-04-06 MED ORDER — PROPOFOL 10 MG/ML IV BOLUS
INTRAVENOUS | Status: DC | PRN
  Administered 2019-04-06: 60 mg via INTRAVENOUS

## 2019-04-06 MED ORDER — CHLORHEXIDINE GLUCONATE CLOTH 2 % EX PADS: 6.0000 | MEDICATED_PAD | Freq: Once | CUTANEOUS | Status: DC

## 2019-04-06 MED ORDER — LACTATED RINGERS IV SOLN: INTRAVENOUS | Status: DC | PRN

## 2019-04-06 MED ORDER — LIDOCAINE HCL (CARDIAC) PF 50 MG/5ML IV SOSY
PREFILLED_SYRINGE | INTRAVENOUS | Status: DC | PRN
  Administered 2019-04-06: 50 mg via INTRAVENOUS

## 2019-04-06 MED ORDER — GLYCOPYRROLATE 0.2 MG/ML IJ SOLN
INTRAMUSCULAR | Status: DC | PRN
  Administered 2019-04-06: .2 mg via INTRAVENOUS

## 2019-04-06 MED ORDER — PROPOFOL 500 MG/50ML IV EMUL
INTRAVENOUS | Status: DC | PRN
  Administered 2019-04-06: 175 ug/kg/min via INTRAVENOUS

## 2019-04-06 NOTE — H&P (Signed)
@LOGO @   Primary Care Physician:  Curlene Labrum, MD Primary Gastroenterologist:  Dr. Gala Romney  Pre-Procedure History & Physical: HPI:  Andrea CHEESE is a 69 y.o. female here for screening examination for esophageal varices.  Recent new diagnosis of cirrhosis based on cross-sectional imaging.  Patient denies dysphagia.  Still has left lower quadrant abdominal pain patient goes from constipation to diarrhea.  May go a over a day without a bowel movement may have 2-3 loose stools in a given day with some incontinence.  No bleeding.  Getting a rectosigmoid on recent CT but negative colonoscopy a little over 1 year ago.  She denies dysphagia.  Past Medical History:  Diagnosis Date  . Anxiety   . Asthma   . Bipolar 1 disorder ([place])   . Depression   . Diabetes ([place])   . Essential tremor   . GERD (gastroesophageal reflux disease)   . Headache   . High cholesterol     Past Surgical History:  Procedure Laterality Date  . ABDOMINAL HYSTERECTOMY  1997  . BIOPSY  01/17/2018   Procedure: BIOPSY;  Surgeon: Daneil Dolin, MD;  Location: [facility];  Service: Endoscopy;;  colon  . carpal tunnel Bilateral 1999, 06/2009  . COLONOSCOPY  2013   Dr. Britta Mccreedy: no colon polyps. quality of prep was fair. repeat colonoscopy in five years.   . COLONOSCOPY WITH PROPOFOL N/A 01/17/2018   Dr. Gala Romney: Colonic lipoma, 2 tubular adenomas removed.  Random colon biopsies negative.  Next colonoscopy 5 years.  Marland Kitchen POLYPECTOMY  01/17/2018   Procedure: POLYPECTOMY;  Surgeon: Daneil Dolin, MD;  Location: [facility];  Service: Endoscopy;;  colon  . SKIN CANCER EXCISION  2007  . TONSILLECTOMY  1965  . ulner nerve  Left 06/2009   decompression    Prior to Admission medications   Medication Sig Start Date End Date Taking? Authorizing Provider  acetaminophen (TYLENOL) 500 MG tablet Take 1,000 mg by mouth 2 (two) times daily as needed for moderate pain or headache.   Yes [provider]  buPROPion  (WELLBUTRIN XL) 150 MG 24 hr tablet TAKE 1 TABLET BY MOUTH ONCE DAILY IN THE MORNING Patient taking differently: Take 150 mg by mouth daily.  03/08/19  Yes Cloria Spring, MD  cetirizine (ZYRTEC) 10 MG tablet Take 10 mg by mouth daily.   Yes [provider]  Cholecalciferol (VITAMIN D3) 50 MCG (2000 UT) TABS Take 2,000 Units by mouth daily.    Yes [provider]  diazepam (VALIUM) 10 MG tablet Take 1 tablet (10 mg total) by mouth at bedtime. 03/08/19  Yes Cloria Spring, MD  diazepam (VALIUM) 2 MG tablet Take 1 tablet (2 mg total) by mouth daily. Patient taking differently: Take 2 mg by mouth 2 (two) times daily as needed for anxiety.  03/08/19 03/07/20 Yes Cloria Spring, MD  dicyclomine (BENTYL) 10 MG capsule Take one capsule every morning to prevent loose stool. You may take up to three times a day as needed for abdominal cramps and loose stool. Patient taking differently: Take 10 mg by mouth See admin instructions. Take 10 mg every morning to prevent loose stool. You may take an additional 10 mg up to three times a day as needed for abdominal cramps and loose stool. 03/14/19  Yes Erenest Rasher, PA-C  estrogens, conjugated, (PREMARIN) 1.25 MG tablet Take 1.25 mg by mouth daily.    Yes [provider]  lithium 300 MG tablet Take  2 tablets (600 mg total) by mouth at bedtime. 03/08/19  Yes Cloria Spring, MD  metFORMIN (GLUCOPHAGE) 500 MG tablet Take 500 mg by mouth in the morning and at bedtime.    Yes [provider]  naproxen sodium (ALEVE) 220 MG tablet Take 220 mg by mouth daily as needed (pain).   Yes [provider]  omeprazole (PRILOSEC) 40 MG capsule Take 40 mg by mouth daily. 03/11/19  Yes [provider]  ondansetron (ZOFRAN) 4 MG tablet TAKE 1 TABLET BY MOUTH EVERY 8 HOURS AS NEEDED FOR NAUSEA AND VOMITING Patient taking differently: Take 4 mg by mouth every 8 (eight) hours as needed for nausea or vomiting.  09/14/18  Yes Mahala Menghini, PA-C  propranolol ER (INDERAL LA) 60 MG 24 hr capsule Take 1 capsule (60 mg total) by mouth daily. 03/08/19 03/07/20 Yes Cloria Spring, MD  omeprazole (PRILOSEC) 20 MG capsule TAKE 2 Capsules BY MOUTH ONCE DAILY Patient not taking: No sig reported 12/30/15   Soyla Dryer, PA-C    Allergies as of 01/31/2019 - Review Complete 01/30/2019  Allergen Reaction Noted  . Pramipexole Other (See Comments) 04/24/2015  . Propranolol  05/14/2017  . Pollen extract Other (See Comments) 04/24/2015    Family History  Problem Relation Age of Onset  . Bipolar disorder Brother   . Diabetes Brother   . Heart disease Brother   . Diabetes Father   . Heart attack Father   . Heart disease Father   . Diabetes Sister   . Diabetes Mother   . Brain cancer Mother   . Colon cancer Neg Hx     Social History   Socioeconomic History  . Marital status: Divorced    Spouse name: Not on file  . Number of children: 1  . Years of education: 41  . Highest education level: Not on file  Occupational History    Comment: employed at [employer]  . Smoking status: Current Every Day Smoker    Packs/day: 0.50    Years: 43.00    Pack years: 21.50    Types: Cigarettes  . Smokeless tobacco: Never Used  . Tobacco comment: smoking since 69yrs old.    Substance and Sexual Activity  . Alcohol use: No    Alcohol/week: 0.0 standard drinks  . Drug use: No  . Sexual activity: Not Currently  Other Topics Concern  . Not on file  Social History Narrative   Lives alone.  Retired.  Education 12th grade.  One child.     Caffeine use- sodas, 2 daily   Social Determinants of Health   Financial Resource Strain:   . Difficulty of Paying Living Expenses:   Food Insecurity:   . Worried About Charity fundraiser in the Last Year:   . Arboriculturist in the Last Year:   Transportation Needs:   . Film/video editor (Medical):   Marland Kitchen Lack of Transportation (Non-Medical):   Physical Activity:   . Days  of Exercise per Week:   . Minutes of Exercise per Session:   Stress:   . Feeling of Stress :   Social Connections:   . Frequency of Communication with Friends and Family:   . Frequency of Social Gatherings with Friends and Family:   . Attends Religious Services:   . Active Member of Clubs or Organizations:   . Attends Archivist Meetings:   Marland Kitchen Marital Status:   Intimate Partner Violence:   .  Fear of Current or Ex-Partner:   . Emotionally Abused:   Marland Kitchen Physically Abused:   . Sexually Abused:     Review of Systems: See HPI, otherwise negative ROS  Physical Exam: BP 139/73   Temp 98.1 F (36.7 C) (Oral)   Resp 20   SpO2 97%  General:   Alert,  Well-developed, well-nourished, pleasant and cooperative in NAD Skin:  Intact without significant lesions or rashes. Neck:  Supple; no masses or thyromegaly. No significant cervical adenopathy. Lungs:  Clear throughout to auscultation.   No wheezes, crackles, or rhonchi. No acute distress. Heart:  Regular rate and rhythm; no murmurs, clicks, rubs,  or gallops. Abdomen: Non-distended, normal bowel sounds.  Soft and nontender without appreciable mass or hepatosplenomegaly.  Pulses:  Normal pulses noted. Extremities:  Without clubbing or edema.  Impression/Plan: 69 year old lady with new diagnosis of cirrhosis based on cross-sectional imaging.  She has a very good preserved hepatic synthetic function.  No upper GI tract symptoms currently. She is here for screening EGD per plan.  The risks, benefits, limitations, alternatives and imponderables have been reviewed with the patient. Potential for esophageal dilation, biopsy, etc. have also been reviewed.  Questions have been answered. All parties agreeable.     Notice: This dictation was prepared with Dragon dictation along with smaller phrase technology. Any transcriptional errors that result from this process are unintentional and may not be corrected upon review.

## 2019-04-06 NOTE — Transfer of Care (Signed)
Immediate Anesthesia Transfer of Care Note  Patient: Andrea Dickson  Procedure(s) Performed: ESOPHAGOGASTRODUODENOSCOPY (EGD) WITH PROPOFOL (N/A )  Patient Location: PACU  Anesthesia Type:General  Level of Consciousness: awake, alert  and patient cooperative  Airway & Oxygen Therapy: Patient Spontanous Breathing  Post-op Assessment: Report given to RN and Post -op Vital signs reviewed and stable  Post vital signs: Reviewed and stable  Last Vitals:  Vitals Value Taken Time  BP    Temp    Pulse    Resp    SpO2      Last Pain:  Vitals:   04/06/19 1119  TempSrc:   PainSc: 8       Patients Stated Pain Goal: 7 ([id number])  Complications: No apparent anesthesia complications

## 2019-04-06 NOTE — Anesthesia Postprocedure Evaluation (Signed)
Anesthesia Post Note  Patient: Andrea Dickson  Procedure(s) Performed: ESOPHAGOGASTRODUODENOSCOPY (EGD) WITH PROPOFOL (N/A )  Patient location during evaluation: PACU Anesthesia Type: General Level of consciousness: awake and alert and patient cooperative Pain management: satisfactory to patient Vital Signs Assessment: post-procedure vital signs reviewed and stable Respiratory status: spontaneous breathing Cardiovascular status: stable Postop Assessment: no apparent nausea or vomiting Anesthetic complications: no     Last Vitals:  Vitals:   04/06/19 1022 04/06/19 1135  BP: 139/73 (!) 100/57  Resp: 20 20  Temp: 36.7 C (!) 36.3 C  SpO2: 97%     Last Pain:  Vitals:   04/06/19 1119  TempSrc:   PainSc: 8                  Jada Kuhnert

## 2019-04-06 NOTE — Anesthesia Procedure Notes (Signed)
Date/Time: 04/06/2019 11:18 AM Performed by: Vista Deck, CRNA Pre-anesthesia Checklist: Patient identified, Emergency Drugs available, Suction available, Timeout performed and Patient being monitored Patient Re-evaluated:Patient Re-evaluated prior to induction Oxygen Delivery Method: Nasal Cannula

## 2019-04-06 NOTE — Anesthesia Preprocedure Evaluation (Signed)
Anesthesia Evaluation  Patient identified by MRN, date of birth, ID band Patient awake    Reviewed: Allergy & Precautions, H&P , NPO status , Patient's Chart, lab work & pertinent test results, reviewed documented beta blocker date and time   Airway Mallampati: II  TM Distance: >3 FB Neck ROM: full    Dental no notable dental hx. (+) Upper Dentures   Pulmonary asthma , Current Smoker and Patient abstained from smoking.,    Pulmonary exam normal        Cardiovascular negative cardio ROS Normal cardiovascular exam     Neuro/Psych  Headaches, PSYCHIATRIC DISORDERS Anxiety Depression Bipolar Disorder    GI/Hepatic GERD  Medicated,  Endo/Other  diabetes, Type 2  Renal/GU      Musculoskeletal   Abdominal   Peds  Hematology negative hematology ROS (+)   Anesthesia Other Findings   Reproductive/Obstetrics negative OB ROS                             Anesthesia Physical Anesthesia Plan  ASA: II  Anesthesia Plan: General   Post-op Pain Management:    Induction:   PONV Risk Score and Plan: 1 and Propofol infusion  Airway Management Planned:   Additional Equipment:   Intra-op Plan:   Post-operative Plan:   Informed Consent: I have reviewed the patients History and Physical, chart, labs and discussed the procedure including the risks, benefits and alternatives for the proposed anesthesia with the patient or authorized representative who has indicated his/her understanding and acceptance.       Plan Discussed with: CRNA  Anesthesia Plan Comments:         Anesthesia Quick Evaluation

## 2019-04-06 NOTE — Discharge Instructions (Signed)
EGD Discharge instructions Please read the instructions outlined below and refer to this sheet in the next few weeks. These discharge instructions provide you with general information on caring for yourself after you leave the hospital. Your doctor may also give you specific instructions. While your treatment has been planned according to the most current medical practices available, unavoidable complications occasionally occur. If you have any problems or questions after discharge, please call your doctor. ACTIVITY  You may resume your regular activity but move at a slower pace for the next 24 hours.   Take frequent rest periods for the next 24 hours.   Walking will help expel (get rid of) the air and reduce the bloated feeling in your abdomen.   No driving for 24 hours (because of the anesthesia (medicine) used during the test).   You may shower.   Do not sign any important legal documents or operate any machinery for 24 hours (because of the anesthesia used during the test).  NUTRITION  Drink plenty of fluids.   You may resume your normal diet.   Begin with a light meal and progress to your normal diet.   Avoid alcoholic beverages for 24 hours or as instructed by your caregiver.  MEDICATIONS  You may resume your normal medications unless your caregiver tells you otherwise.  WHAT YOU CAN EXPECT TODAY  You may experience abdominal discomfort such as a feeling of fullness or "gas" pains.  FOLLOW-UP  Your doctor will discuss the results of your test with you.  SEEK IMMEDIATE MEDICAL ATTENTION IF ANY OF THE FOLLOWING OCCUR:  Excessive nausea (feeling sick to your stomach) and/or vomiting.   Severe abdominal pain and distention (swelling).   Trouble swallowing.   Temperature over 101 F (37.8 C).   Rectal bleeding or vomiting of blood.    No esophageal varices found on today's examination  I recommend a repeat examination in 2 years  Office visit with [place] in 6 weeks to  further evaluate your left lower quadrant abdominal pain  At patient request, I called Foye Spurling at [phone] and reviewed results     Monitored Anesthesia Care, Care After These instructions provide you with information about caring for yourself after your procedure. Your health care provider may also give you more specific instructions. Your treatment has been planned according to current medical practices, but problems sometimes occur. Call your health care provider if you have any problems or questions after your procedure. What can I expect after the procedure? After your procedure, you may:  Feel sleepy for several hours.  Feel clumsy and have poor balance for several hours.  Feel forgetful about what happened after the procedure.  Have poor judgment for several hours.  Feel nauseous or vomit.  Have a sore throat if you had a breathing tube during the procedure. Follow these instructions at home: For at least 24 hours after the procedure:      Have a responsible adult stay with you. It is important to have someone help care for you until you are awake and alert.  Rest as needed.  Do not: ? Participate in activities in which you could fall or become injured. ? Drive. ? Use heavy machinery. ? Drink alcohol. ? Take sleeping pills or medicines that cause drowsiness. ? Make important decisions or sign legal documents. ? Take care of children on your own. Eating and drinking  Follow the diet that is recommended by your health care provider.  If you vomit, drink water,  juice, or soup when you can drink without vomiting.  Make sure you have little or no nausea before eating solid foods. General instructions  Take over-the-counter and prescription medicines only as told by your health care provider.  If you have sleep apnea, surgery and certain medicines can increase your risk for breathing problems. Follow instructions from your health care provider about  wearing your sleep device: ? Anytime you are sleeping, including during daytime naps. ? While taking prescription pain medicines, sleeping medicines, or medicines that make you drowsy.  If you smoke, do not smoke without supervision.  Keep all follow-up visits as told by your health care provider. This is important. Contact a health care provider if:  You keep feeling nauseous or you keep vomiting.  You feel light-headed.  You develop a rash.  You have a fever. Get help right away if:  You have trouble breathing. Summary  For several hours after your procedure, you may feel sleepy and have poor judgment.  Have a responsible adult stay with you for at least 24 hours or until you are awake and alert. This information is not intended to replace advice given to you by your health care provider. Make sure you discuss any questions you have with your health care provider. Document Revised: 03/29/2017 Document Reviewed: 04/21/2015 Elsevier Patient Education  [place].

## 2019-04-06 NOTE — Op Note (Signed)
[facility] Patient Name: Andrea Dickson Procedure Date: 04/06/2019 11:14 AM MRN: [id number] Date of Birth: 1950/12/18 Attending MD: Norvel Richards , MD CSN: [id number] Age: 69 Admit Type: Outpatient Procedure:                Upper GI endoscopy Indications:              Screening procedure Providers:                Norvel Richards, MD, Jeanann Lewandowsky. Sharon Seller, RN,                            Nelma Rothman, Technician Referring MD:              Medicines:                Propofol per Anesthesia Complications:            No immediate complications. Estimated Blood Loss:     Estimated blood loss: none. Procedure:                Pre-Anesthesia Assessment:                           - Prior to the procedure, a History and Physical                            was performed, and patient medications and                            allergies were reviewed. The patient's tolerance of                            previous anesthesia was also reviewed. The risks                            and benefits of the procedure and the sedation                            options and risks were discussed with the patient.                            All questions were answered, and informed consent                            was obtained. Prior Anticoagulants: The patient has                            taken no previous anticoagulant or antiplatelet                            agents. ASA Grade Assessment: II - A patient with                            mild systemic disease. After reviewing the risks  and benefits, the patient was deemed in                            satisfactory condition to undergo the procedure.                           After obtaining informed consent, the endoscope was                            passed under direct vision. Throughout the                            procedure, the patient's blood pressure, pulse, and                            oxygen  saturations were monitored continuously. The                            [id number]) was introduced through the                            mouth, and advanced to the second part of duodenum.                            The upper GI endoscopy was accomplished without                            difficulty. The patient tolerated the procedure                            well. Scope In: 11:24:35 AM Scope Out: 11:28:13 AM Total Procedure Duration: 0 hours 3 minutes 38 seconds  Findings:      The examined esophagus was normal.      Mild portal hypertensive gastropathy was found in the gastric body.      The exam was otherwise without abnormality.      The duodenal bulb and second portion of the duodenum were normal. Impression:               - Normal esophagus.                           - Portal hypertensive gastropathy.                           - The examination was otherwise normal.                           - Normal duodenal bulb and second portion of the                            duodenum.                           - No specimens collected. Moderate Sedation:      Moderate (conscious) sedation was personally administered by an  anesthesia professional. The following parameters were monitored: oxygen       saturation, heart rate, blood pressure, respiratory rate, EKG, adequacy       of pulmonary ventilation, and response to care. Recommendation:           - Patient has a contact number available for                            emergencies. The signs and symptoms of potential                            delayed complications were discussed with the                            patient. Return to normal activities tomorrow.                            Written discharge instructions were provided to the                            patient.                           - Resume previous diet.                           - Continue present medications.                           - Repeat  upper endoscopy in 2 years for screening                            purposes.                           - Return to GI clinic in 6 weeks to further                            evaluate left lower quadrant abdominal pain.. Procedure Code(s):        --- Professional ---                           [phone], Esophagogastroduodenoscopy, flexible,                            transoral; diagnostic, including collection of                            specimen(s) by brushing or washing, when performed                            (separate procedure) Diagnosis Code(s):        --- Professional ---                           K76.6, Portal hypertension  K31.89, Other diseases of stomach and duodenum                           Z13.810, Encounter for screening for upper                            gastrointestinal disorder CPT copyright 2019 [employer]. All rights reserved. The codes documented in this report are preliminary and upon coder review may  be revised to meet current compliance requirements. Cristopher Estimable. Rourk, MD Norvel Richards, MD 04/06/2019 11:35:21 AM This report has been signed electronically. Number of Addenda: 0

## 2019-04-12 DIAGNOSIS — E7849 Other hyperlipidemia: Secondary | ICD-10-CM | POA: Diagnosis not present

## 2019-04-12 DIAGNOSIS — I10 Essential (primary) hypertension: Secondary | ICD-10-CM | POA: Diagnosis not present

## 2019-05-12 DIAGNOSIS — I10 Essential (primary) hypertension: Secondary | ICD-10-CM | POA: Diagnosis not present

## 2019-05-12 DIAGNOSIS — E7849 Other hyperlipidemia: Secondary | ICD-10-CM | POA: Diagnosis not present

## 2019-05-16 ENCOUNTER — Encounter: Payer: Self-pay | Admitting: Emergency Medicine

## 2019-05-16 ENCOUNTER — Other Ambulatory Visit: Payer: Self-pay | Admitting: Emergency Medicine

## 2019-05-16 DIAGNOSIS — R109 Unspecified abdominal pain: Secondary | ICD-10-CM

## 2019-05-16 DIAGNOSIS — K7460 Unspecified cirrhosis of liver: Secondary | ICD-10-CM

## 2019-05-16 DIAGNOSIS — Z79899 Other long term (current) drug therapy: Secondary | ICD-10-CM

## 2019-05-17 NOTE — Progress Notes (Signed)
Referring Provider: Curlene Labrum, MD Primary Care Physician:  Curlene Labrum, MD Primary GI Physician: Dr. Gala Romney  Chief Complaint  Patient presents with  . Abdominal Pain    left side pain  . Diarrhea    off/on throughout the day  . Nausea    no vomiting    HPI:   Andrea Dickson is a 69 y.o. female presenting today with chronic intermittent diarrhea with associated urgency and incontinence at times, GERD, and chronic left lower quadrant pain.  Colonoscopy up-to-date in January 2020 with a couple tubular adenomas removed.  Random biopsies negative for microscopic colitis.  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  Recommended repeat colonoscopy in 2025.  Also with newly diagnosed well compensated cirrhosis suspected to be secondary to NASH in September 2020. Hepatitis A, B, C negative.  LFTs normal.  No autoimmune hepatitis, PBC, iron overload.  MELD 7.   Patient was last seen in our office 01/30/2019 for follow-up of LLQ pain.  Pain is been present daily for about 1 year.  Radiated from LLQ to upper abdomen upper chest and into her upper back.  Associated nausea without vomiting.  Had recently started on a muscle relaxer which helped for couple of days but pain returned.  No association with BMs or meals.  Bending over worsens pain.  Typically 1 BM daily but occasional flares of diarrhea.  Typically taking Bentyl every morning and sometimes twice a day.  Bentyl did not help with LLQ pain.  GERD symptoms are well controlled on omeprazole daily.  Abdominal exam with mild tenderness palpation in the epigastric area, significant tenderness to palpation in the left lower quadrant.  Concern for possible smoldering diverticulitis versus nephrolithiasis versus musculoskeletal component of left lower quadrant pain.  Plans to repeat CT.  Advised to continue Bentyl up to 4 times daily as needed for abdominal cramping and diarrhea.  For cirrhosis, she was scheduled for EGD and AFP checked.  Otherwise,  repeat ultrasound was due in March 2021 and labs due in April 2021.  She was to complete hepatitis A and B vaccine.  CT A/P with contrast 02/02/2019: No evidence of diverticulitis or other acute findings to explain LLQ pain.  Discussed results with Dr. Gala Romney.  Stated we have evaluated GI etiologies extensively without identification of cause.  She has seen her back on the same side and has had some improvement with Robaxin initially.  She is about to follow-up with PCP to have her\spine evaluated.    AFP was within normal limits. Ultrasound was not completed.  Labs prior to EGD completed 04/04/2019.  CBC essentially normal other than platelets 110 (L).  CMP essentially normal except for glucose 224.  INR within normal limits.  Meld 7. EGD 04/06/2019: Normal esophagus, mild portal hypertensive gastropathy, otherwise normal exam.  Recommend repeat EGD in 2 years for variceal screening.   Today:  LLQ pain: Continues. Daily. Comes and goes. No identified triggers. No association with meals. Sometimes she will have pain prior to diarrhea at times. Other times for no reason. Zofran helps lower abdominal pain. When LLQ pain starts to hurt, she gets nauseated. No vomiting. Not nauseated between spells of pain. At times, her entire lower abdomen will hurt. Not radiating up to her chest and into her back at this time. Occasional pain radiating from left lower back to LLQ. Not as bad as it used to be. Has not seen PCP yet. Has appointment next month.   Not on a  muscle relaxer at this time.   GERD: Well controlled on omeprazole daily.   Diarrhea: Taking bentyl 3-4 times a day. Not helping. 5-6 BMs daily. Denies watery stool. Starts out soft/formed and then ends up passing small balls of stool.  Denies hard stool or constipation.  States she is deftly not constipated.  Has difficulty describing her stool.  Some urgency. Doesn't make it to the bathroom at times. Color is yellow. Has a lot of gas. Feels bloated.  Some abdominal pain prior to BMs that improves after. Bentyl doesn't help pain. No blood in the stool or black stool. Drinks milk and eats cheese regularly. Eating french fries. Eating coliflour, carrots, broccoli.   No recent antibiotics. No well water.   Cirrhosis: No swelling in abdomen or LE. No yellowing of eyes or skin. No mental status changes. No dark urine. No bruising or bleeding. In the process fof completing Hep A/B vaccine series. Has 1 shot left for both A/B.   Taking tylenol as needed for headaches.  No NSAIDs.   Has been on metformin for about 1 year. Dosing was increased to BID recently. No worsening of diarrhea with metformin.   Past Medical History:  Diagnosis Date  . Anxiety   . Asthma   . Bipolar 1 disorder ([place])   . Depression   . Diabetes ([place])   . Essential tremor   . GERD (gastroesophageal reflux disease)   . Headache   . High cholesterol     Past Surgical History:  Procedure Laterality Date  . ABDOMINAL HYSTERECTOMY  1997  . BIOPSY  01/17/2018   Procedure: BIOPSY;  Surgeon: Daneil Dolin, MD;  Location: [facility];  Service: Endoscopy;;  colon  . carpal tunnel Bilateral 1999, 06/2009  . COLONOSCOPY  2013   Dr. Britta Mccreedy: no colon polyps. quality of prep was fair. repeat colonoscopy in five years.   . COLONOSCOPY WITH PROPOFOL N/A 01/17/2018   Dr. Gala Romney: Colonic lipoma, 2 tubular adenomas removed.  Random colon biopsies negative.  Next colonoscopy 5 years.  . ESOPHAGOGASTRODUODENOSCOPY (EGD) WITH PROPOFOL N/A 04/06/2019   Procedure: ESOPHAGOGASTRODUODENOSCOPY (EGD) WITH PROPOFOL;  Surgeon: Daneil Dolin, MD;   Normal esophagus, mild portal hypertensive gastropathy, otherwise normal exam.  Recommend repeat EGD in 2 years for variceal screening.   Marland Kitchen POLYPECTOMY  01/17/2018   Procedure: POLYPECTOMY;  Surgeon: Daneil Dolin, MD;  Location: [facility];  Service: Endoscopy;;  colon  . SKIN CANCER EXCISION  2007  . TONSILLECTOMY  1965  . ulner nerve  Left  06/2009   decompression    Current Outpatient Medications  Medication Sig Dispense Refill  . acetaminophen (TYLENOL) 500 MG tablet Take 1,000 mg by mouth 2 (two) times daily as needed for moderate pain or headache.    Marland Kitchen buPROPion (WELLBUTRIN XL) 150 MG 24 hr tablet TAKE 1 TABLET BY MOUTH ONCE DAILY IN THE MORNING (Patient taking differently: Take 150 mg by mouth daily. ) 30 tablet 2  . cetirizine (ZYRTEC) 10 MG tablet Take 10 mg by mouth daily.    . Cholecalciferol (VITAMIN D3) 50 MCG (2000 UT) TABS Take 2,000 Units by mouth daily.     . diazepam (VALIUM) 10 MG tablet Take 1 tablet (10 mg total) by mouth at bedtime. 60 tablet 2  . diazepam (VALIUM) 2 MG tablet Take 1 tablet (2 mg total) by mouth daily. (Patient taking differently: Take 2 mg by mouth 2 (two) times daily as needed for anxiety. ) 30 tablet  2  . dicyclomine (BENTYL) 10 MG capsule Take one capsule every morning to prevent loose stool. You may take up to three times a day as needed for abdominal cramps and loose stool. (Patient taking differently: Take 10 mg by mouth See admin instructions. Take 10 mg every morning to prevent loose stool. You may take an additional 10 mg up to three times a day as needed for abdominal cramps and loose stool.) 90 capsule 1  . estrogens, conjugated, (PREMARIN) 1.25 MG tablet Take 1.25 mg by mouth daily.     Marland Kitchen lithium 300 MG tablet Take 2 tablets (600 mg total) by mouth at bedtime. 60 tablet 2  . metFORMIN (GLUCOPHAGE) 500 MG tablet Take 500 mg by mouth in the morning and at bedtime.     Marland Kitchen omeprazole (PRILOSEC) 40 MG capsule Take 40 mg by mouth daily.    . ondansetron (ZOFRAN) 4 MG tablet TAKE 1 TABLET BY MOUTH EVERY 8 HOURS AS NEEDED FOR NAUSEA AND VOMITING (Patient taking differently: Take 4 mg by mouth every 8 (eight) hours as needed for nausea or vomiting. ) 20 tablet 0  . propranolol ER (INDERAL LA) 60 MG 24 hr capsule Take 1 capsule (60 mg total) by mouth daily. 30 capsule 2   No current  facility-administered medications for this visit.    Allergies as of 05/18/2019 - Review Complete 05/18/2019  Allergen Reaction Noted  . Pramipexole Other (See Comments) 04/24/2015  . Crestor [rosuvastatin calcium]  03/28/2019  . Pollen extract Other (See Comments) 04/24/2015    Family History  Problem Relation Age of Onset  . Bipolar disorder Brother   . Diabetes Brother   . Heart disease Brother   . Diabetes Father   . Heart attack Father   . Heart disease Father   . Diabetes Sister   . Diabetes Mother   . Brain cancer Mother   . Colon cancer Neg Hx     Social History   Socioeconomic History  . Marital status: Divorced    Spouse name: Not on file  . Number of children: 1  . Years of education: 66  . Highest education level: Not on file  Occupational History    Comment: employed at [employer]  . Smoking status: Current Every Day Smoker    Packs/day: 0.50    Years: 43.00    Pack years: 21.50    Types: Cigarettes  . Smokeless tobacco: Never Used  . Tobacco comment: smoking since 69yrs old.    Substance and Sexual Activity  . Alcohol use: No    Alcohol/week: 0.0 standard drinks  . Drug use: No  . Sexual activity: Not Currently  Other Topics Concern  . Not on file  Social History Narrative   Lives alone.  Retired.  Education 12th grade.  One child.     Caffeine use- sodas, 2 daily   Social Determinants of Health   Financial Resource Strain:   . Difficulty of Paying Living Expenses:   Food Insecurity:   . Worried About Charity fundraiser in the Last Year:   . Arboriculturist in the Last Year:   Transportation Needs:   . Film/video editor (Medical):   Marland Kitchen Lack of Transportation (Non-Medical):   Physical Activity:   . Days of Exercise per Week:   . Minutes of Exercise per Session:   Stress:   . Feeling of Stress :   Social Connections:   . Frequency of Communication  with Friends and Family:   . Frequency of Social Gatherings with  Friends and Family:   . Attends Religious Services:   . Active Member of Clubs or Organizations:   . Attends Archivist Meetings:   Marland Kitchen Marital Status:     Review of Systems: Gen: Denies fever, chills, cold or flulike symptoms, lightheadedness, dizziness, presyncope, syncope..  CV: Denies chest pain or palpitations. Resp: Denies dyspnea or cough. GI: See HPI Derm: Denies rash Heme: See HPI  Physical Exam: BP 137/71   Pulse 76   Temp (!) 97.1 F (36.2 C) (Oral)   Ht 5\' 6"  (1.676 m)   Wt 163 lb 9.6 oz (74.2 kg)   BMI 26.41 kg/m  General:   Alert and oriented. No distress noted. Pleasant and cooperative.  Head:  Normocephalic and atraumatic. Eyes:  Conjuctiva clear without scleral icterus. Heart:  S1, S2 present without murmurs appreciated. Lungs:  Clear to auscultation bilaterally. No wheezes, rales, or rhonchi. No distress.  Abdomen:  +BS, soft and non-distended.  Mild to moderate tenderness to palpation in the left lower quadrant.  No rebound or guarding. No HSM or masses noted. Msk:  Symmetrical without gross deformities. Normal posture. Extremities:  Without edema. Neurologic:  Alert and  oriented x4 Psych:  Normal mood and affect.

## 2019-05-18 ENCOUNTER — Encounter: Payer: Self-pay | Admitting: *Deleted

## 2019-05-18 ENCOUNTER — Other Ambulatory Visit: Payer: Self-pay

## 2019-05-18 ENCOUNTER — Other Ambulatory Visit: Payer: Self-pay | Admitting: *Deleted

## 2019-05-18 ENCOUNTER — Ambulatory Visit (INDEPENDENT_AMBULATORY_CARE_PROVIDER_SITE_OTHER): Payer: Medicare Other | Admitting: Gastroenterology

## 2019-05-18 ENCOUNTER — Encounter: Payer: Self-pay | Admitting: Gastroenterology

## 2019-05-18 VITALS — BP 137/71 | HR 76 | Temp 97.1°F | Ht 66.0 in | Wt 163.6 lb

## 2019-05-18 DIAGNOSIS — K529 Noninfective gastroenteritis and colitis, unspecified: Secondary | ICD-10-CM

## 2019-05-18 DIAGNOSIS — K7460 Unspecified cirrhosis of liver: Secondary | ICD-10-CM | POA: Diagnosis not present

## 2019-05-18 DIAGNOSIS — K219 Gastro-esophageal reflux disease without esophagitis: Secondary | ICD-10-CM | POA: Diagnosis not present

## 2019-05-18 DIAGNOSIS — R1032 Left lower quadrant pain: Secondary | ICD-10-CM | POA: Diagnosis not present

## 2019-05-18 NOTE — Patient Instructions (Addendum)
Please stop Bentyl for now as this is not helping with diarrhea or abdominal pain.  Please have labs and stool studies completed.  I recommend you hold off on completing these until you have held Bentyl for about 1 week to see if stools become looser.  Follow a strict lactose-free diet or take Lactaid tablets prior to any dairy consumption.  Follow a low-fat diet.  Avoid fried, fatty, greasy foods.  All meats should be lean (poultry or fish) and baked, boiled, or broiled.  Avoid gas producing items including cabbage, cauliflower, beans, Brussels sprouts, carbonated beverages, artificial sweeteners, and drinking through a straw.  For cirrhosis: You will be due for repeat labs in September 2021.  We will help arrange this. You also be due for repeat imaging in July 2021.  We will help arrange this. Be sure you are following a low sodium diet.  No more than 2 g daily. Do not take more than 2 g of Tylenol daily. Continue to avoid alcohol.  We will plan to see back in 3 months.  We will be in touch with you once we have lab results with further recommendations.  Aliene Altes, PA-C [facility]   Lactose-Free Diet, Adult If you have lactose intolerance, you are not able to digest lactose. Lactose is a natural sugar found mainly in dairy milk and dairy products. You may need to avoid all foods and beverages that contain lactose. A lactose-free diet can help you do this. Which foods have lactose? Lactose is found in dairy milk and dairy products, such as:  Yogurt.  Cheese.  Butter.  Margarine.  Sour cream.  Cream.  Whipped toppings and nondairy creamers.  Ice cream and other dairy-based desserts. Lactose is also found in foods or products made with dairy milk or milk ingredients. To find out whether a food contains dairy milk or a milk ingredient, look at the ingredients list. Avoid foods with the statement "May contain milk" and foods that contain:  Milk  powder.  Whey.  Curd.  Caseinate.  Lactose.  Lactalbumin.  Lactoglobulin. What are alternatives to dairy milk and foods made with milk products?  Lactose-free milk.  Soy milk with added calcium and vitamin D.  Almond milk, coconut milk, rice milk, or other nondairy milk alternatives with added calcium and vitamin D. Note that these are low in protein.  Soy products, such as soy yogurt, soy cheese, soy ice cream, and soy-based sour cream.  Other nut milk products, such as almond yogurt, almond cheese, cashew yogurt, cashew cheese, cashew ice cream, coconut yogurt, and coconut ice cream. What are tips for following this plan?  Do not consume foods, beverages, vitamins, minerals, or medicines containing lactose. Read ingredient lists carefully.  Look for the words "lactose-free" on labels.  Use lactase enzyme drops or tablets as directed by your health care provider.  Use lactose-free milk or a milk alternative, such as soy milk or almond milk, for drinking and cooking.  Make sure you get enough calcium and vitamin D in your diet. A lactose-free eating plan can be lacking in these important nutrients.  Take calcium and vitamin D supplements as directed by your health care provider. Talk to your health care provider about supplements if you are not able to get enough calcium and vitamin D from food. What foods can I eat?  Fruits All fresh, canned, frozen, or dried fruits that are not processed with lactose. Vegetables All fresh, frozen, and canned vegetables without cheese, cream, or  butter sauces. Grains Any that are not made with dairy milk or dairy products. Meats and other proteins Any meat, fish, poultry, and other protein sources that are not made with dairy milk or dairy products. Soy cheese and yogurt. Fats and oils Any that are not made with dairy milk or dairy products. Beverages Lactose-free milk. Soy, rice, or almond milk with added calcium and vitamin D.  Fruit and vegetable juices. Sweets and desserts Any that are not made with dairy milk or dairy products. Seasonings and condiments Any that are not made with dairy milk or dairy products. Calcium Calcium is found in many foods that contain lactose and is important for bone health. The amount of calcium you need depends on your age:  Adults younger than 50 years: 1,000 mg of calcium a day.  Adults older than 50 years: 1,200 mg of calcium a day. If you are not getting enough calcium, you may get it from other sources, including:  Orange juice with calcium added. There are 300-350 mg of calcium in 1 cup of orange juice.  Calcium-fortified soy milk. There are 300-400 mg of calcium in 1 cup of calcium-fortified soy milk.  Calcium-fortified rice or almond milk. There are 300 mg of calcium in 1 cup of calcium-fortified rice or almond milk.  Calcium-fortified breakfast cereals. There are 100-1,000 mg of calcium in calcium-fortified breakfast cereals.  Spinach, cooked. There are 145 mg of calcium in  cup of cooked spinach.  Edamame, cooked. There are 130 mg of calcium in  cup of cooked edamame.  Collard greens, cooked. There are 125 mg of calcium in  cup of cooked collard greens.  Kale, frozen or cooked. There are 90 mg of calcium in  cup of cooked or frozen kale.  Almonds. There are 95 mg of calcium in  cup of almonds.  Broccoli, cooked. There are 60 mg of calcium in 1 cup of cooked broccoli. The items listed above may not be a complete list of recommended foods and beverages. Contact a dietitian for more options. What foods are not recommended? Fruits None, unless they are made with dairy milk or dairy products. Vegetables None, unless they are made with dairy milk or dairy products. Grains Any grains that are made with dairy milk or dairy products. Meats and other proteins None, unless they are made with dairy milk or dairy products. Dairy All dairy products, including milk,  goat's milk, buttermilk, kefir, acidophilus milk, flavored milk, evaporated milk, condensed milk, dulce de [place], eggnog, yogurt, cheese, and cheese spreads. Fats and oils Any that are made with milk or milk products. Margarines and salad dressings that contain milk or cheese. Cream. Half and half. Cream cheese. Sour cream. Chip dips made with sour cream or yogurt. Beverages Hot chocolate. Cocoa with lactose. Instant iced teas. Powdered fruit drinks. Smoothies made with dairy milk or yogurt. Sweets and desserts Any that are made with milk or milk products. Seasonings and condiments Chewing gum that has lactose. Spice blends if they contain lactose. Artificial sweeteners that contain lactose. Nondairy creamers. The items listed above may not be a complete list of foods and beverages to avoid. Contact a dietitian for more information. Summary  If you are lactose intolerant, it means that you have a hard time digesting lactose, a natural sugar found in milk and milk products.  Following a lactose-free diet can help you manage this condition.  Calcium is important for bone health and is found in many foods that contain lactose. Talk  with your health care provider about other sources of calcium. This information is not intended to replace advice given to you by your health care provider. Make sure you discuss any questions you have with your health care provider. Document Revised: 01/26/2017 Document Reviewed: 01/26/2017 Elsevier Patient Education  [place].  Abdominal Bloating When you have abdominal bloating, your abdomen may feel full, tight, or painful. It may also look bigger than normal or swollen (distended). Common causes of abdominal bloating include:  Swallowing air.  Constipation.  Problems digesting food.  Eating too much.  Irritable bowel syndrome. This is a condition that affects the large intestine.  Lactose intolerance. This is an inability to digest lactose, a  natural sugar in dairy products.  Celiac disease. This is a condition that affects the ability to digest gluten, a protein found in some grains.  Gastroparesis. This is a condition that slows down the movement of food in the stomach and small intestine. It is more common in people with diabetes mellitus.  Gastroesophageal reflux disease (GERD). This is a digestive condition that makes stomach acid flow back into the esophagus.  Urinary retention. This means that the body is holding onto urine, and the bladder cannot be emptied all the way. Follow these instructions at home: Eating and drinking  Avoid eating too much.  Try not to swallow air while talking or eating.  Avoid eating while lying down.  Avoid these foods and drinks: ? Foods that cause gas, such as broccoli, cabbage, cauliflower, and baked beans. ? Carbonated drinks. ? Hard candy. ? Chewing gum. Medicines  Take over-the-counter and prescription medicines only as told by your health care provider.  Take probiotic medicines. These medicines contain live bacteria or yeasts that can help digestion.  Take coated peppermint oil capsules. Activity  Try to exercise regularly. Exercise may help to relieve bloating that is caused by gas and relieve constipation. General instructions  Keep all follow-up visits as told by your health care provider. This is important. Contact a health care provider if:  You have nausea and vomiting.  You have diarrhea.  You have abdominal pain.  You have unusual weight loss or weight gain.  You have severe pain, and medicines do not help. Get help right away if:  You have severe chest pain.  You have trouble breathing.  You have shortness of breath.  You have trouble urinating.  You have darker urine than normal.  You have blood in your stools or have dark, tarry stools. Summary  Abdominal bloating means that the abdomen is swollen.  Common causes of abdominal bloating are  swallowing air, constipation, and problems digesting food.  Avoid eating too much and avoid swallowing air.  Avoid foods that cause gas, carbonated drinks, hard candy, and chewing gum. This information is not intended to replace advice given to you by your health care provider. Make sure you discuss any questions you have with your health care provider. Document Revised: 04/18/2018 Document Reviewed: 01/31/2016 Elsevier Patient Education  [place].  Low-Sodium Eating Plan Sodium, which is an element that makes up salt, helps you maintain a healthy balance of fluids in your body. Too much sodium can increase your blood pressure and cause fluid and waste to be held in your body. Your health care provider or dietitian may recommend following this plan if you have high blood pressure (hypertension), kidney disease, liver disease, or heart failure. Eating less sodium can help lower your blood pressure, reduce swelling, and  protect your heart, liver, and kidneys. What are tips for following this plan? General guidelines  Most people on this plan should limit their sodium intake to 1,500-2,000 mg (milligrams) of sodium each day. Reading food labels   The Nutrition Facts label lists the amount of sodium in one serving of the food. If you eat more than one serving, you must multiply the listed amount of sodium by the number of servings.  Choose foods with less than 140 mg of sodium per serving.  Avoid foods with 300 mg of sodium or more per serving. Shopping  Look for lower-sodium products, often labeled as "low-sodium" or "no salt added."  Always check the sodium content even if foods are labeled as "unsalted" or "no salt added".  Buy fresh foods. ? Avoid canned foods and premade or frozen meals. ? Avoid canned, cured, or processed meats  Buy breads that have less than 80 mg of sodium per slice. Cooking  Eat more home-cooked food and less restaurant, buffet, and fast  food.  Avoid adding salt when cooking. Use salt-free seasonings or herbs instead of table salt or sea salt. Check with your health care provider or pharmacist before using salt substitutes.  Cook with plant-based oils, such as canola, sunflower, or olive oil. Meal planning  When eating at a restaurant, ask that your food be prepared with less salt or no salt, if possible.  Avoid foods that contain MSG (monosodium glutamate). MSG is sometimes added to [place] food, bouillon, and some canned foods. What foods are recommended? The items listed may not be a complete list. Talk with your dietitian about what dietary choices are best for you. Grains Low-sodium cereals, including oats, puffed wheat and rice, and shredded wheat. Low-sodium crackers. Unsalted rice. Unsalted pasta. Low-sodium bread. Whole-grain breads and whole-grain pasta. Vegetables Fresh or frozen vegetables. "No salt added" canned vegetables. "No salt added" tomato sauce and paste. Low-sodium or reduced-sodium tomato and vegetable juice. Fruits Fresh, frozen, or canned fruit. Fruit juice. Meats and other protein foods Fresh or frozen (no salt added) meat, poultry, seafood, and fish. Low-sodium canned tuna and salmon. Unsalted nuts. Dried peas, beans, and lentils without added salt. Unsalted canned beans. Eggs. Unsalted nut butters. Dairy Milk. Soy milk. Cheese that is naturally low in sodium, such as ricotta cheese, fresh mozzarella, or Swiss cheese Low-sodium or reduced-sodium cheese. Cream cheese. Yogurt. Fats and oils Unsalted butter. Unsalted margarine with no trans fat. Vegetable oils such as canola or olive oils. Seasonings and other foods Fresh and dried herbs and spices. Salt-free seasonings. Low-sodium mustard and ketchup. Sodium-free salad dressing. Sodium-free light mayonnaise. Fresh or refrigerated horseradish. Lemon juice. Vinegar. Homemade, reduced-sodium, or low-sodium soups. Unsalted popcorn and pretzels. Low-salt  or salt-free chips. What foods are not recommended? The items listed may not be a complete list. Talk with your dietitian about what dietary choices are best for you. Grains Instant hot cereals. Bread stuffing, pancake, and biscuit mixes. Croutons. Seasoned rice or pasta mixes. Noodle soup cups. Boxed or frozen macaroni and cheese. Regular salted crackers. Self-rising flour. Vegetables Sauerkraut, pickled vegetables, and relishes. Olives. [place] fries. Onion rings. Regular canned vegetables (not low-sodium or reduced-sodium). Regular canned tomato sauce and paste (not low-sodium or reduced-sodium). Regular tomato and vegetable juice (not low-sodium or reduced-sodium). Frozen vegetables in sauces. Meats and other protein foods Meat or fish that is salted, canned, smoked, spiced, or pickled. Bacon, ham, sausage, hotdogs, corned beef, chipped beef, packaged lunch meats, salt pork, jerky, pickled herring, anchovies, regular  canned tuna, sardines, salted nuts. Dairy Processed cheese and cheese spreads. Cheese curds. Blue cheese. Feta cheese. String cheese. Regular cottage cheese. Buttermilk. Canned milk. Fats and oils Salted butter. Regular margarine. Ghee. Bacon fat. Seasonings and other foods Onion salt, garlic salt, seasoned salt, table salt, and sea salt. Canned and packaged gravies. Worcestershire sauce. Tartar sauce. Barbecue sauce. Teriyaki sauce. Soy sauce, including reduced-sodium. Steak sauce. Fish sauce. Oyster sauce. Cocktail sauce. Horseradish that you find on the shelf. Regular ketchup and mustard. Meat flavorings and tenderizers. Bouillon cubes. Hot sauce and Tabasco sauce. Premade or packaged marinades. Premade or packaged taco seasonings. Relishes. Regular salad dressings. Salsa. Potato and tortilla chips. Corn chips and puffs. Salted popcorn and pretzels. Canned or dried soups. Pizza. Frozen entrees and pot pies. Summary  Eating less sodium can help lower your blood pressure, reduce  swelling, and protect your heart, liver, and kidneys.  Most people on this plan should limit their sodium intake to 1,500-2,000 mg (milligrams) of sodium each day.  Canned, boxed, and frozen foods are high in sodium. Restaurant foods, fast foods, and pizza are also very high in sodium. You also get sodium by adding salt to food.  Try to cook at home, eat more fresh fruits and vegetables, and eat less fast food, canned, processed, or prepared foods. This information is not intended to replace advice given to you by your health care provider. Make sure you discuss any questions you have with your health care provider. Document Revised: 12/11/2016 Document Reviewed: 12/23/2015 Elsevier Patient Education  2020 [employer].

## 2019-05-21 ENCOUNTER — Encounter: Payer: Self-pay | Admitting: Gastroenterology

## 2019-05-21 NOTE — Assessment & Plan Note (Addendum)
69 year old female with history of chronic intermittent diarrhea.  Prior work-up with colonoscopy with random biopsies negative for microscopic colitis in January 2020.  Had been felt symptoms were likely secondary to IBS-D as symptoms seem to be fairly well controlled on Bentyl once to twice daily with occasional diarrhea flares.  However, since I last saw her in January 2021, she has increased Bentyl to 3-4 times daily and continues with 5-6 stools per day that started out soft/formed and progressed to mushy balls of stool.  Denies hard stools or constipation.  Associated urgency and incontinence at times.  Occasional abdominal pain prior to BMs that resolves thereafter. Increase abdominal gas/bloating.  No bright red blood per rectum or melena.  Query whether frequent use of Bentyl is contributing to incomplete bowel movements causing more frequent BMs that are small in quantity as the day goes on.  Increased gas/bloating may be secondary to dietary choices as she consumes dairy products frequently as well as gas producing foods including broccoli and cauliflower.  Metformin was also increased to twice daily recently which could be contributing to changes in bowel function.  Cannot rule out infectious diarrhea that Bentyl is masking, SIBO, or pancreatic insufficiency considering history of diabetes.  Advise she stop Bentyl for now as this is not helping. If she has watery stools after stopping Bentyl, she is to complete stool testing including C. difficile and GI pathogen panel. We will also check for celiac disease (TTG IgA, IgA total), fecal fat, and fecal elastase. Advise she follow a low-fat diet.  Avoid fried, fatty, greasy foods all meats should be lean and baked, boiled, or broiled. Avoid gas producing items including cabbage, cauliflower, beans, Brussels sprouts, carbonated beverages, artificial sweeteners, and drinking through a straw.  Handout provided. Follow lactose-free diet or take Lactaid  tablets prior to any dairy consumption.  Handout provided. Follow-up in 3 months.

## 2019-05-21 NOTE — Assessment & Plan Note (Addendum)
Cirrhosis suspected to be secondary to NASH identified on CT A/P in September 2020.  Associated moderate splenomegaly with mild thrombocytopenia.  No alcohol.  Hepatitis A, B, and C negative.  LFTs normal.  No autoimmune hepatitis, PBC, or iron overload.  MELD 7.  EGD March 2021 with normal esophagus, mild portal hypertensive gastropathy, otherwise normal.  Due for repeat EGD in 2 years.  Currently in the middle of receiving hepatitis A and B vaccination.  No signs or symptoms of decompensated liver disease.   Plan: Due for updated ultrasound in July 2021.  We will help arrange this. Due for updated labs in September 2021.  EGD in March 2023. Advised to continue to follow low-sodium diet.  No more than 2 g daily. No more than 2 g of Tylenol daily. Continue to avoid alcohol. Follow-up in 3 months for diarrhea and LLQ pain as discussed below.

## 2019-05-21 NOTE — Assessment & Plan Note (Signed)
Well-controlled on omeprazole 40 mg daily.    Continue omeprazole 40 mg daily. Follow-up in 3 months due to diarrhea and left lower abdominal pain.

## 2019-05-21 NOTE — Assessment & Plan Note (Addendum)
Daily, intermittent LLQ abdominal pain for greater than 1 year. Associated nausea without vomiting. Taking Zofran nausea and abdominal pain. No nausea between spells of pain. Pain had been radiating to the upper abdomen into her chest and into her back but this seems to have resolved for the most part.  Occasionally pain does radiate to her left lower back. No association with meals.  Occasional pain prior to BMs but this is not consistent.  Bentyl does not help with abdominal pain.  She has had chang in bowel function.  Historically with chronic diarrhea on Bentyl 1-2 times daily that had been working well.  Currently taking Bentyl 3-4 times daily having 5-6 soft to mushy BMs daily. No BRBPR or melena.  Prior evaluation with CT in September 2020 and January 2021 unrevealing.  Colonoscopy up-to-date in January 2020 with tubular adenomas due for repeat in 2025.  Interestingly, she had been placed on Robaxin by PCP historically and this did help her lower abdominal pain.  After her last CT in January 2021, she was advised to follow-up with PCP to discuss having her spine evaluated as a contributing etiology of LLQ pain, but she has not seen them yet.  Not currently on a muscle relaxer.  Abdominal exam with mild to moderate tenderness in the LLQ.   Etiology of LLQ abdominal pain is not clear.  Very well could be a musculoskeletal component.  With change in bowel function, query whether frequent Bentyl use has created mild constipation with more frequent but incomplete BMs which could contribute to abdominal pain.  Smoldering diverticulitis that has not been picked up by CT still on the differential although less likely with colonoscopy in January 2020 without diverticula noted.  Advised to follow-up with PCP regarding lower abdominal pain. We will stop Bentyl for now as this does not seem to be helping with abdominal pain or bowel function at this time. Continue Zofran as needed. Further work-up for change in  bowel function includes C. difficile and GI pathogen panel if she develops watery diarrhea after discontinuing Bentyl, celiac serologies, fecal fat, and fecal elastase. She was also advised to follow a lactose-free diet, low-fat diet, and avoid items that cause gas and bloating. If we improve bowel function and patient has evaluation with PCP without improvement, may consider treating empirically for diverticulitis.  Follow-up in 3 months. Advised to call with any worsening symptoms.

## 2019-05-30 DIAGNOSIS — Z23 Encounter for immunization: Secondary | ICD-10-CM | POA: Diagnosis not present

## 2019-05-30 NOTE — Progress Notes (Signed)
Spoke with pt and she is aware that labs are needed. Pt will call back if needed.

## 2019-05-30 NOTE — Progress Notes (Signed)
Lmom, waiting on a return call.  

## 2019-06-01 DIAGNOSIS — R109 Unspecified abdominal pain: Secondary | ICD-10-CM | POA: Diagnosis not present

## 2019-06-01 DIAGNOSIS — K529 Noninfective gastroenteritis and colitis, unspecified: Secondary | ICD-10-CM | POA: Diagnosis not present

## 2019-06-01 DIAGNOSIS — K7460 Unspecified cirrhosis of liver: Secondary | ICD-10-CM | POA: Diagnosis not present

## 2019-06-01 DIAGNOSIS — Z79899 Other long term (current) drug therapy: Secondary | ICD-10-CM | POA: Diagnosis not present

## 2019-06-01 DIAGNOSIS — K219 Gastro-esophageal reflux disease without esophagitis: Secondary | ICD-10-CM | POA: Diagnosis not present

## 2019-06-01 DIAGNOSIS — R197 Diarrhea, unspecified: Secondary | ICD-10-CM | POA: Diagnosis not present

## 2019-06-01 LAB — CBC WITH DIFFERENTIAL/PLATELET
Absolute Monocytes: 173 cells/uL — ABNORMAL LOW (ref 200–950)
Basophils Absolute: 22 cells/uL (ref 0–200)
Basophils Relative: 0.6 %
Eosinophils Absolute: 83 cells/uL (ref 15–500)
Eosinophils Relative: 2.3 %
HCT: 37.1 % (ref 35.0–45.0)
Hemoglobin: 12.7 g/dL (ref 11.7–15.5)
Lymphs Abs: 1246 cells/uL (ref 850–3900)
MCH: 29.9 pg (ref 27.0–33.0)
MCHC: 34.2 g/dL (ref 32.0–36.0)
MCV: 87.3 fL (ref 80.0–100.0)
MPV: 10.6 fL (ref 7.5–12.5)
Monocytes Relative: 4.8 %
Neutro Abs: 2077 cells/uL (ref 1500–7800)
Neutrophils Relative %: 57.7 %
Platelets: 81 10*3/uL — ABNORMAL LOW (ref 140–400)
RBC: 4.25 10*6/uL (ref 3.80–5.10)
RDW: 13.9 % (ref 11.0–15.0)
Total Lymphocyte: 34.6 %
WBC: 3.6 10*3/uL — ABNORMAL LOW (ref 3.8–10.8)

## 2019-06-01 LAB — HEPATIC FUNCTION PANEL
AG Ratio: 1.6 (calc) (ref 1.0–2.5)
ALT: 12 U/L (ref 6–29)
AST: 22 U/L (ref 10–35)
Albumin: 4.1 g/dL (ref 3.6–5.1)
Alkaline phosphatase (APISO): 116 U/L (ref 37–153)
Bilirubin, Direct: 0.2 mg/dL (ref 0.0–0.2)
Globulin: 2.5 g/dL (calc) (ref 1.9–3.7)
Indirect Bilirubin: 0.6 mg/dL (calc) (ref 0.2–1.2)
Total Bilirubin: 0.8 mg/dL (ref 0.2–1.2)
Total Protein: 6.6 g/dL (ref 6.1–8.1)

## 2019-06-05 ENCOUNTER — Other Ambulatory Visit (HOSPITAL_COMMUNITY): Payer: Self-pay | Admitting: Psychiatry

## 2019-06-05 ENCOUNTER — Telehealth (HOSPITAL_COMMUNITY): Payer: Self-pay | Admitting: *Deleted

## 2019-06-05 MED ORDER — DIAZEPAM 2 MG PO TABS: 2.0000 mg | ORAL_TABLET | Freq: Two times a day (BID) | ORAL | 2 refills | Status: DC | PRN

## 2019-06-05 MED ORDER — DIAZEPAM 10 MG PO TABS: 10.0000 mg | ORAL_TABLET | Freq: Every day | ORAL | 2 refills | Status: DC

## 2019-06-05 MED ORDER — BUPROPION HCL ER (XL) 150 MG PO TB24: ORAL_TABLET | ORAL | 2 refills | Status: DC

## 2019-06-05 MED ORDER — LITHIUM CARBONATE 300 MG PO TABS: 600.0000 mg | ORAL_TABLET | Freq: Every day | ORAL | 2 refills | Status: DC

## 2019-06-05 NOTE — Telephone Encounter (Signed)
[facility] - [place], [place] - [place] [address] -- Updated in [facility] Will now home deliver patient's med's please send refills for medication Delivery on tomorrow 06/06/22

## 2019-06-05 NOTE — Telephone Encounter (Signed)
sent 

## 2019-06-06 ENCOUNTER — Other Ambulatory Visit (HOSPITAL_COMMUNITY): Payer: Self-pay | Admitting: Psychiatry

## 2019-06-06 ENCOUNTER — Telehealth (HOSPITAL_COMMUNITY): Payer: Self-pay | Admitting: *Deleted

## 2019-06-06 MED ORDER — PROPRANOLOL HCL ER 60 MG PO CP24: 60.0000 mg | ORAL_CAPSULE | Freq: Every day | ORAL | 2 refills | Status: DC

## 2019-06-06 NOTE — Telephone Encounter (Signed)
sent 

## 2019-06-06 NOTE — Telephone Encounter (Signed)
Andrea Dickson with [facility] called wanting to inform provider that patient Propranolol is missing and they need the refill sent to [facility] by today so that they can deliver pt medication by tomorrow.

## 2019-06-06 NOTE — Telephone Encounter (Signed)
Noted  

## 2019-06-06 NOTE — Progress Notes (Signed)
Fecal fat within normal limits. Waiting on fecal elastase.

## 2019-06-07 LAB — FECAL FAT, QUALITATIVE: FECAL FAT, QUALITATIVE: NORMAL

## 2019-06-07 LAB — TISSUE TRANSGLUTAMINASE, IGA: (tTG) Ab, IgA: 1 U/mL

## 2019-06-07 LAB — GASTROINTESTINAL PATHOGEN PANEL PCR
C. difficile Tox A/B, PCR: NOT DETECTED
Campylobacter, PCR: NOT DETECTED
Cryptosporidium, PCR: NOT DETECTED
E coli (ETEC) LT/ST PCR: NOT DETECTED
E coli (STEC) stx1/stx2, PCR: NOT DETECTED
E coli 0157, PCR: NOT DETECTED
Giardia lamblia, PCR: NOT DETECTED
Norovirus, PCR: NOT DETECTED
Rotavirus A, PCR: NOT DETECTED
Salmonella, PCR: NOT DETECTED
Shigella, PCR: NOT DETECTED

## 2019-06-07 LAB — IGA: Immunoglobulin A: 307 mg/dL (ref 70–320)

## 2019-06-07 LAB — PANCREATIC ELASTASE, FECAL: Pancreatic Elastase-1, Stool: >500 mcg/g

## 2019-06-12 DIAGNOSIS — Z7984 Long term (current) use of oral hypoglycemic drugs: Secondary | ICD-10-CM | POA: Diagnosis not present

## 2019-06-12 DIAGNOSIS — K589 Irritable bowel syndrome without diarrhea: Secondary | ICD-10-CM | POA: Diagnosis not present

## 2019-06-12 DIAGNOSIS — E7849 Other hyperlipidemia: Secondary | ICD-10-CM | POA: Diagnosis not present

## 2019-06-12 DIAGNOSIS — E1165 Type 2 diabetes mellitus with hyperglycemia: Secondary | ICD-10-CM | POA: Diagnosis not present

## 2019-06-22 ENCOUNTER — Telehealth: Payer: Self-pay

## 2019-06-22 NOTE — Telephone Encounter (Signed)
Spoke with pt. Pt was notified of [place] recommendations. Pt was advised to have C.Diff testing, avoid dairy, follow a low fat diet, avoid items that cause bloating and start an otc probiotic as directed.

## 2019-06-22 NOTE — Telephone Encounter (Signed)
Spoke with pt. Pt states she is have mid abdominal pain that goes across pts entire abdomen and diarrhea.  Stool is loose daily to qod and not watery. Pt is having diarrhea and hasn't completed the CDIFF testing. Pt is aware that she needs to have testing completed.  Pt states she isn't having cramping but pain that comes and goes. Pt reports some gas. Pt is following a low fat diet and avoid all dairy products. Pt is taking  Pantoprazole daily as directed and d/c Benytl as directed.  The pain pt mentioned has been present for several months. Pt is asking for a medication that helps with bloating, gas, diarrhea.

## 2019-06-22 NOTE — Telephone Encounter (Signed)
I suspect this is the same chronic abdominal pain she was having when I saw her last. Would like for her to complete the C. Diff testing before I prescribe any additional medications. We may consider empiric treatment of SIBO (small intestinal bacterial over growth) if negative. May also consider trying Levsin.   She can go ahead and start a daily probiotic.  Roque Cash colon health, align, digestive advantage are good options.  Continue to avoid ALL DAIRY and follow a low fat diet.  Avoid items that cause gas and bloating including broccoli, cauliflower, cabbage, Brussels sprouts, beans, artificial sweeteners, carbonated beverages, and drinking through a straw.

## 2019-06-22 NOTE — Telephone Encounter (Signed)
VM received 06/22/19 @12 :20 pm. Pt would like to discuss getting a medication for bloating and abdominal pain. [phone]

## 2019-07-12 DIAGNOSIS — E1165 Type 2 diabetes mellitus with hyperglycemia: Secondary | ICD-10-CM | POA: Diagnosis not present

## 2019-07-12 DIAGNOSIS — E7849 Other hyperlipidemia: Secondary | ICD-10-CM | POA: Diagnosis not present

## 2019-07-12 DIAGNOSIS — K589 Irritable bowel syndrome without diarrhea: Secondary | ICD-10-CM | POA: Diagnosis not present

## 2019-07-12 DIAGNOSIS — Z7984 Long term (current) use of oral hypoglycemic drugs: Secondary | ICD-10-CM | POA: Diagnosis not present

## 2019-07-17 DIAGNOSIS — E1169 Type 2 diabetes mellitus with other specified complication: Secondary | ICD-10-CM | POA: Diagnosis not present

## 2019-07-17 DIAGNOSIS — R7989 Other specified abnormal findings of blood chemistry: Secondary | ICD-10-CM | POA: Diagnosis not present

## 2019-07-17 DIAGNOSIS — E782 Mixed hyperlipidemia: Secondary | ICD-10-CM | POA: Diagnosis not present

## 2019-07-17 DIAGNOSIS — K219 Gastro-esophageal reflux disease without esophagitis: Secondary | ICD-10-CM | POA: Diagnosis not present

## 2019-07-17 DIAGNOSIS — E7800 Pure hypercholesterolemia, unspecified: Secondary | ICD-10-CM | POA: Diagnosis not present

## 2019-07-20 DIAGNOSIS — K766 Portal hypertension: Secondary | ICD-10-CM | POA: Diagnosis not present

## 2019-07-20 DIAGNOSIS — K7460 Unspecified cirrhosis of liver: Secondary | ICD-10-CM | POA: Diagnosis not present

## 2019-07-20 DIAGNOSIS — K760 Fatty (change of) liver, not elsewhere classified: Secondary | ICD-10-CM | POA: Diagnosis not present

## 2019-07-20 DIAGNOSIS — E1169 Type 2 diabetes mellitus with other specified complication: Secondary | ICD-10-CM | POA: Diagnosis not present

## 2019-07-27 ENCOUNTER — Other Ambulatory Visit: Payer: Self-pay

## 2019-07-27 ENCOUNTER — Ambulatory Visit (HOSPITAL_COMMUNITY)
Admission: RE | Admit: 2019-07-27 | Discharge: 2019-07-27 | Disposition: A | Discharge status: Home / Self Care | Payer: Medicare Other | Source: Ambulatory Visit | Attending: Gastroenterology | Admitting: Gastroenterology

## 2019-07-27 DIAGNOSIS — K7689 Other specified diseases of liver: Secondary | ICD-10-CM | POA: Diagnosis not present

## 2019-07-27 DIAGNOSIS — K760 Fatty (change of) liver, not elsewhere classified: Secondary | ICD-10-CM | POA: Diagnosis not present

## 2019-07-27 DIAGNOSIS — K7460 Unspecified cirrhosis of liver: Secondary | ICD-10-CM | POA: Diagnosis not present

## 2019-07-30 ENCOUNTER — Other Ambulatory Visit (HOSPITAL_COMMUNITY): Payer: Self-pay | Admitting: Psychiatry

## 2019-07-31 DIAGNOSIS — E1169 Type 2 diabetes mellitus with other specified complication: Secondary | ICD-10-CM | POA: Diagnosis not present

## 2019-07-31 DIAGNOSIS — H6122 Impacted cerumen, left ear: Secondary | ICD-10-CM | POA: Diagnosis not present

## 2019-07-31 DIAGNOSIS — R300 Dysuria: Secondary | ICD-10-CM | POA: Diagnosis not present

## 2019-07-31 DIAGNOSIS — R197 Diarrhea, unspecified: Secondary | ICD-10-CM | POA: Diagnosis not present

## 2019-07-31 NOTE — Telephone Encounter (Signed)
Call for appt

## 2019-08-03 ENCOUNTER — Encounter (HOSPITAL_COMMUNITY): Payer: Self-pay | Admitting: Psychiatry

## 2019-08-03 ENCOUNTER — Other Ambulatory Visit: Payer: Self-pay

## 2019-08-03 ENCOUNTER — Telehealth (INDEPENDENT_AMBULATORY_CARE_PROVIDER_SITE_OTHER): Payer: Medicare Other | Admitting: Psychiatry

## 2019-08-03 DIAGNOSIS — F310 Bipolar disorder, current episode hypomanic: Secondary | ICD-10-CM | POA: Diagnosis not present

## 2019-08-03 MED ORDER — PROPRANOLOL HCL ER 60 MG PO CP24: 60.0000 mg | ORAL_CAPSULE | Freq: Every morning | ORAL | 2 refills | Status: DC

## 2019-08-03 MED ORDER — BUPROPION HCL ER (XL) 150 MG PO TB24: ORAL_TABLET | ORAL | 2 refills | Status: DC

## 2019-08-03 MED ORDER — DIAZEPAM 2 MG PO TABS: 2.0000 mg | ORAL_TABLET | Freq: Two times a day (BID) | ORAL | 2 refills | Status: DC | PRN

## 2019-08-03 MED ORDER — LITHIUM CARBONATE 300 MG PO TABS: ORAL_TABLET | ORAL | 2 refills | Status: DC

## 2019-08-03 MED ORDER — DIAZEPAM 10 MG PO TABS: 10.0000 mg | ORAL_TABLET | Freq: Every day | ORAL | 2 refills | Status: DC
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
## 2019-08-07 DIAGNOSIS — Z23 Encounter for immunization: Secondary | ICD-10-CM | POA: Diagnosis not present

## 2019-08-11 DIAGNOSIS — E7849 Other hyperlipidemia: Secondary | ICD-10-CM | POA: Diagnosis not present

## 2019-08-11 DIAGNOSIS — K589 Irritable bowel syndrome without diarrhea: Secondary | ICD-10-CM | POA: Diagnosis not present

## 2019-08-11 DIAGNOSIS — Z7984 Long term (current) use of oral hypoglycemic drugs: Secondary | ICD-10-CM | POA: Diagnosis not present

## 2019-08-11 DIAGNOSIS — E1165 Type 2 diabetes mellitus with hyperglycemia: Secondary | ICD-10-CM | POA: Diagnosis not present

## 2019-08-14 DIAGNOSIS — R300 Dysuria: Secondary | ICD-10-CM | POA: Diagnosis not present

## 2019-08-14 DIAGNOSIS — E1169 Type 2 diabetes mellitus with other specified complication: Secondary | ICD-10-CM | POA: Diagnosis not present

## 2019-08-14 DIAGNOSIS — K589 Irritable bowel syndrome without diarrhea: Secondary | ICD-10-CM | POA: Diagnosis not present

## 2019-08-14 DIAGNOSIS — R197 Diarrhea, unspecified: Secondary | ICD-10-CM | POA: Diagnosis not present

## 2019-08-14 DIAGNOSIS — Z6824 Body mass index (BMI) 24.0-24.9, adult: Secondary | ICD-10-CM | POA: Diagnosis not present

## 2019-08-19 NOTE — Progress Notes (Signed)
Referring Provider: Curlene Labrum, MD Primary Care Physician:  Curlene Labrum, MD Primary GI Physician: Dr. Gala Romney  Chief Complaint  Patient presents with  . Diarrhea    occurs everytime puts something in mouth  . Abdominal Pain    across lower abd  . Bloated    HPI:   Andrea Dickson is a 69 y.o. female with a history of chronic intermittent diarrhea with associated urgency and incontinence at times, GERD, and chronic left lower quadrant pain.  Colonoscopy up-to-date in January 2020 with a couple tubular adenomas removed.  Random biopsies negative for microscopic colitis.  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  Recommended repeat colonoscopy in 2025.  Also with newly diagnosed well compensated cirrhosis suspected to be secondary to NASH in September 2020. Hepatitis A, B, C negative. LFTs normal. No autoimmune hepatitis, PBC, iron overload. MELD 7 based on labs in March 2021, due for updated labs in Sep 2021.  Ultrasound up-to-date in Jul 2021 with cirrhosis and mild splenomegaly, due for repeat in Jan 2022.  EGD up-to-date in Mar 2021 without varices, mild portal hypertensive gastropathy, due for repeat in 2023.  She was last seen 05/18/2019.  She reported daily, intermittent LLQ abdominal pain present for more than a year with associated nausea without vomiting taking Zofran as needed.  Previously, pain had been radiating to her upper abdomen into her chest and into her back but that had resolved.  No association with meals.  Occasional pain prior to BMs but not consistently.  Bentyl did not help abdominal pain. Diarrhea previously controlled on 1-2 Bentyl daily, but she was taking Bentyl 3-4 times daily and still having 5-6 BMs daily.  Increased abdominal gas/bloating.  Prior CTs in Sep 2020 in Jan 2021 unrevealing.  Colonoscopy up-to-date.  Interestingly, she had been placed on Robaxin by PCP previously and noted improvement in abdominal pain. Abdominal exam with mild to moderate TTP in  LLQ.  Differentials for increased bowel frequency included frequent use of Bentyl contributing to incomplete but more frequent bowel movements, recent increase in Metformin, infectious etiology, SIBO, pancreatic insufficiency, dietary intolerances.  Possible MSK component of LLQ pain, less likely smoldering diverticulitis as diverticulosis was not mentioned on colonoscopy in 2020. Plan included following up with PCP, stop Bentyl, complete C. difficile and GI pathogen panel, celiac serologies, fecal fat, fecal elastase, lactose-free diet, avoid gas producing items  Labs completed 06/01/2019: Celiac serologies negative, fecal fat qualitative normal, pancreatic elastase normal, GI pathogen panel negative.  She never submitted sample for C. difficile testing.  Telephone call 06/22/2019 with patient complaining of ongoing abdominal pain, loose stools daily to every other day, I have asked case for medication that helps with gas, bloating, and diarrhea.  She was advised to complete C. difficile testing.  If this was negative, we could consider treatment for SIBO or trying Levsin.  She was advised to go ahead and try a daily probiotic.  C. difficile was not completed.  Today: Diarrhea: States stools are not watery. They are mushy and look oily/greasy at times. BMs after eating 3-4 times a day. No nocturnal stools. No blood in the stool. Reports she had has some black stools here and there. Black stool has been present intermittently for over a year. Last occurred 2 weeks ago. Typically one episode and stool returns to normal color. Intermittent incontinence. When she has the urge to go, her stomach hurts and cramps. Abdominal pain will resolve after BM.  Stomach stays bloated. Not  passing a lot of gas. Worse after eating. Not eating any dairy products. Limiting sodium. Eating hamburgers and hotdogs. This is the only meat she eats. She doesn't like chicken or [place].   Weight is down about 9 lbs in 5 months. Not  trying but states she doesn't eat as much because it causes diarrhea.   Taking 1 imodium typically before or during her meals. She just started this last week. May have 3 BMs daily on imodium. Confirms that prior to starting imodium, she was having 3-4 BMs daily. Stools are more solidified. Stools are productive. States she can not live on imodium.   PCP has prescribed Robaxin again. She is taking it at night so she is not sure how it helps with her abdominal pain.   Cirrhosis: Has completed hepatitis A and B vaccine. No swelling in her abdomen or lower extremities, confusion, jaundice, or bruising.   GERD well controlled on Prilosec. No nausea, vomiting, or dysphagia.  She has been on 2 rounds of antibiotics recently.  First with Bactrim and more recently with Cipro for recurrent UTIs.  Past Medical History:  Diagnosis Date  . Anxiety   . Asthma   . Bipolar 1 disorder ([place])   . Chronic diarrhea   . Cirrhosis [facility])    Diagnosed September 2020, likely secondary to [facility], s/p Hep A/B vaccination in 2021.   Marland Kitchen Depression   . Diabetes ([place])   . Essential tremor   . GERD (gastroesophageal reflux disease)   . Headache   . High cholesterol     Past Surgical History:  Procedure Laterality Date  . ABDOMINAL HYSTERECTOMY  1997  . BIOPSY  01/17/2018   Procedure: BIOPSY;  Surgeon: Daneil Dolin, MD;  [facility];  Service: Endoscopy;;  colon  . carpal tunnel Bilateral 1999, 06/2009  . COLONOSCOPY  2013   Dr. Britta Mccreedy: no colon polyps. quality of prep was fair. repeat colonoscopy in five years.   . COLONOSCOPY WITH PROPOFOL N/A 01/17/2018   Dr. Gala Romney: Colonic lipoma, 2 tubular adenomas removed.  Random colon biopsies negative.  Next colonoscopy 5 years.  . ESOPHAGOGASTRODUODENOSCOPY (EGD) WITH PROPOFOL N/A 04/06/2019   Procedure: ESOPHAGOGASTRODUODENOSCOPY (EGD) WITH PROPOFOL;  Surgeon: Daneil Dolin, MD;   Normal esophagus, mild portal hypertensive gastropathy, otherwise normal exam.   Recommend repeat EGD in 2 years for variceal screening.   Marland Kitchen POLYPECTOMY  01/17/2018   Procedure: POLYPECTOMY;  Surgeon: Daneil Dolin, MD;  [facility];  Service: Endoscopy;;  colon  . SKIN CANCER EXCISION  2007  . TONSILLECTOMY  1965  . ulner nerve  Left 06/2009   decompression    Current Outpatient Medications  Medication Sig Dispense Refill  . acetaminophen (TYLENOL) 500 MG tablet Take 1,000 mg by mouth 2 (two) times daily as needed for moderate pain or headache.    Marland Kitchen buPROPion (WELLBUTRIN XL) 150 MG 24 hr tablet TAKE ONE TABLET BY MOUTH EVERY MORNING 30 tablet 2  . cetirizine (ZYRTEC) 10 MG tablet Take 10 mg by mouth daily.    . Cholecalciferol (VITAMIN D3) 50 MCG (2000 UT) TABS Take 2,000 Units by mouth daily.     . diazepam (VALIUM) 10 MG tablet Take 1 tablet (10 mg total) by mouth at bedtime. 60 tablet 2  . diazepam (VALIUM) 2 MG tablet Take 1 tablet (2 mg total) by mouth 2 (two) times daily as needed for anxiety. 60 tablet 2  . estrogens, conjugated, (PREMARIN) 1.25 MG tablet Take  1.25 mg by mouth daily.     Marland Kitchen lithium 300 MG tablet TAKE TWO TABLETS BY MOUTH EVERYDAY AT BEDTIME 60 tablet 2  . loperamide (IMODIUM) 2 MG capsule Take 2 mg by mouth as needed for diarrhea or loose stools.    . metFORMIN (GLUCOPHAGE) 500 MG tablet Take 500 mg by mouth in the morning and at bedtime.     . methocarbamol (ROBAXIN) 500 MG tablet Take 500 mg by mouth daily as needed for muscle spasms.    Marland Kitchen omeprazole (PRILOSEC) 40 MG capsule Take 40 mg by mouth daily.    . ondansetron (ZOFRAN) 4 MG tablet TAKE 1 TABLET BY MOUTH EVERY 8 HOURS AS NEEDED FOR NAUSEA AND VOMITING (Patient taking differently: Take 4 mg by mouth every 8 (eight) hours as needed for nausea or vomiting. ) 20 tablet 0  . propranolol ER (INDERAL LA) 60 MG 24 hr capsule Take 1 capsule (60 mg total) by mouth every morning. 30 capsule 2   No current facility-administered medications for this visit.    Allergies as of  08/21/2019 - Review Complete 08/21/2019  Allergen Reaction Noted  . Pramipexole Other (See Comments) 04/24/2015  . Crestor [rosuvastatin calcium]  03/28/2019  . Pollen extract Other (See Comments) 04/24/2015    Family History  Problem Relation Age of Onset  . Bipolar disorder Brother   . Diabetes Brother   . Heart disease Brother   . Diabetes Father   . Heart attack Father   . Heart disease Father   . Diabetes Sister   . Diabetes Mother   . Brain cancer Mother   . Colon cancer Neg Hx     Social History   Socioeconomic History  . Marital status: Divorced    Spouse name: Not on file  . Number of children: 1  . Years of education: 3  . Highest education level: Not on file  Occupational History    Comment: employed at [employer]  . Smoking status: Current Every Day Smoker    Packs/day: 0.50    Years: 43.00    Pack years: 21.50    Types: Cigarettes  . Smokeless tobacco: Never Used  . Tobacco comment: smoking since 69yrs old.    Vaping Use  . Vaping Use: Never used  Substance and Sexual Activity  . Alcohol use: No    Alcohol/week: 0.0 standard drinks  . Drug use: No  . Sexual activity: Not Currently  Other Topics Concern  . Not on file  Social History Narrative   Lives alone.  Retired.  Education 12th grade.  One child.     Caffeine use- sodas, 2 daily   Social Determinants of Health   Financial Resource Strain:   . Difficulty of Paying Living Expenses:   Food Insecurity:   . Worried About Charity fundraiser in the Last Year:   . Arboriculturist in the Last Year:   Transportation Needs:   . Film/video editor (Medical):   Marland Kitchen Lack of Transportation (Non-Medical):   Physical Activity:   . Days of Exercise per Week:   . Minutes of Exercise per Session:   Stress:   . Feeling of Stress :   Social Connections:   . Frequency of Communication with Friends and Family:   . Frequency of Social Gatherings with Friends and Family:   . Attends  Religious Services:   . Active Member of Clubs or Organizations:   . Attends Archivist  Meetings:   Marland Kitchen Marital Status:     Review of Systems: Gen: Denies fever, chills, lightheadedness, dizziness, presyncope, syncope. CV: Denies chest pain or palpitations. Resp: Denies dyspnea or cough. GI: See HPI  Heme: See HPI  Physical Exam: BP 116/66   Pulse 62   Temp 97.6 F (36.4 C) (Temporal)   Ht 5\' 6"  (1.676 m)   Wt 160 lb 6.4 oz (72.8 kg)   BMI 25.89 kg/m  General:   Alert and oriented. No distress noted. Pleasant and cooperative.  Head:  Normocephalic and atraumatic. Eyes:  Conjuctiva clear without scleral icterus. Heart:  S1, S2 present without murmurs appreciated. Lungs:  Clear to auscultation bilaterally. No wheezes, rales, or rhonchi. No distress.  Abdomen:  +BS, soft, and non-distended. Mild tenderness to palpation in LLQ (improved since last visit). No rebound or guarding. No HSM or masses noted. Msk:  Symmetrical without gross deformities. Normal posture. Extremities:  Without edema. Neurologic:  Alert and  oriented x4 Psych:  Normal mood and affect.

## 2019-08-21 ENCOUNTER — Encounter: Payer: Self-pay | Admitting: Gastroenterology

## 2019-08-21 ENCOUNTER — Ambulatory Visit (INDEPENDENT_AMBULATORY_CARE_PROVIDER_SITE_OTHER): Payer: Medicare Other | Admitting: Gastroenterology

## 2019-08-21 ENCOUNTER — Other Ambulatory Visit: Payer: Self-pay

## 2019-08-21 VITALS — BP 116/66 | HR 62 | Temp 97.6°F | Ht 66.0 in | Wt 160.4 lb

## 2019-08-21 DIAGNOSIS — K529 Noninfective gastroenteritis and colitis, unspecified: Secondary | ICD-10-CM

## 2019-08-21 DIAGNOSIS — K219 Gastro-esophageal reflux disease without esophagitis: Secondary | ICD-10-CM | POA: Diagnosis not present

## 2019-08-21 DIAGNOSIS — K7460 Unspecified cirrhosis of liver: Secondary | ICD-10-CM | POA: Diagnosis not present

## 2019-08-21 NOTE — Assessment & Plan Note (Addendum)
69 year old female with history of chronic diarrhea.  Prior work-up with colonoscopy with random biopsies negative for microscopic colitis in January 2020.  It was thought patient had IBS-D as her symptoms initially responded fairly well to Bentyl, but Bentyl lost its effect around May 2021 and was discontinued.  Additional work-up included celiac serologies negative, fecal fat qualitative normal, pancreatic elastase normal, GI pathogen panel (which includes C. difficile toxin A/B) negative.  Reports postprandial mushy/oily/greasy stools 3-4 times daily.  Associated urgency at times with abdominal cramping that resolves after BMs.  Also notes abdominal bloating.  She is avoiding dairy products.  Started taking 1 Imodium with meals which produces 3 BMs daily that are more solidified.  No nocturnal stools or BRBPR.  Reports over 1 year history of intermittent black stool with last occurrence 2 weeks ago.  She has been on 2 rounds of antibiotics recently for UTIs but has not noticed any significant change in diarrhea.  With history of diabetes, oily/greasy stools, and bloating, suspect patient may have pancreatic insufficiency.  Could also have SIBO, component of IBS-D, dietary intolerances, and/or med affect secondary to Metformin which had been increased prior to last visit in May.  Doubt true melena, but can not rule this out with history of cirrhosis and portal hypertensive gastropathy will go ahead and update CBC.  EGD up-to-date in March 2021.  Plan: CBC Trial Creon 72,000 units with meals and 36,000 units with snacks.  Samples provided.  Requested 1 week progress report. Continue taking Imodium as needed.  Hold in the setting of constipation. Continue following a lactose-free diet. Continue working on following a low fat diet.  Plan to follow-up in 2 months.  If no significant improvement with Creon, consider treatment for SIBO, trial of Levsin, LOW FODMAP diet for bloating, and/or having patient  discuss with PCP other diabetes medication options.

## 2019-08-21 NOTE — Patient Instructions (Addendum)
Please have labs completed.   I would like for you to try pancreatic enzymes to see if this helps with your abdominal bloating and diarrhea. We are providing you with samples of Creon today.  You will take 2 capsules with meals and 1 capsule with snacks. Please call in 1 week with a progress report.   You may continue taking Imodium as needed.  Hold in the setting of constipation.  Continue following a lactose-free diet.  Continue taking Prilosec 40 mg daily 30 minutes before breakfast.  Continue following a low-sodium diet.  No more than 2000 mg/day.  It is okay to take Tylenol as needed.  No more than 2000 mg in 24 hours.  We will plan to see you back in 2 months for follow-up of diarrhea. Again, please call in 1 week to let me know how Creon is working for you.   Aliene Altes, PA-C [facility]

## 2019-08-21 NOTE — Assessment & Plan Note (Addendum)
Well-controlled on omeprazole 40 mg daily.  Denies dysphagia.  Reports intermittent episodes of melena which have been present for more than a year.  Not sure this is true melena as her last hemoglobin was 12.7 in May 2021.  She does have history of cirrhosis.  EGD up-to-date in March 2021 without varices.  She did have evidence of mild portal hypertensive gastropathy is due for repeat in 2023.  Plan: Update CBC Continue omeprazole 40 mg daily 30 minutes before breakfast. Follow-up in 2 months for chronic diarrhea.

## 2019-08-21 NOTE — Assessment & Plan Note (Addendum)
Cirrhosis suspect to be secondary to NASH identified on CT A/P in September 2020.  Associated moderate splenomegaly with thrombocytopenia.  No alcohol.  Hepatitis A, B, C negative.  LFTs normal.  No autoimmune hepatitis, PBC, or iron overload.  Meld 7 based on labs in March 2021.  Ultrasound up-to-date in July 2021 with no focal liver lesions.  EGD March 2021 with normal esophagus, mild portal hypertensive gastropathy, otherwise normal.  She has completed hepatitis A and B vaccination.  No signs or symptoms of decompensated disease.  Of note, she does report intermittent black stools that have been present for more than a year.  I am not sure if this is true melena as her last hemoglobin was 12.7 in May 2021, but I cannot rule this out in the setting of portal hypertensive gastropathy.  Last occurrence was about 2 weeks ago.  Plan: We will go ahead and update CBC, CMP, INR, AFP Follow low-sodium diet.  No more than 2000 mg/day. No more than 2000 mg of Tylenol per day. Ultrasound due in January 2022. EGD due in 2023.

## 2019-08-28 ENCOUNTER — Telehealth (HOSPITAL_COMMUNITY): Payer: Self-pay | Admitting: *Deleted

## 2019-08-28 ENCOUNTER — Other Ambulatory Visit (HOSPITAL_COMMUNITY): Payer: Self-pay | Admitting: Psychiatry

## 2019-08-28 DIAGNOSIS — F3162 Bipolar disorder, current episode mixed, moderate: Secondary | ICD-10-CM

## 2019-08-28 DIAGNOSIS — M7552 Bursitis of left shoulder: Secondary | ICD-10-CM | POA: Diagnosis not present

## 2019-08-28 NOTE — Telephone Encounter (Signed)
Informed patient and she stated she will stop by sometimes this week to pick up paper.

## 2019-08-28 NOTE — Telephone Encounter (Signed)
Patient called to see if the labs to get her Lithium levels checked has been entered with [facility].  [phone]

## 2019-08-28 NOTE — Telephone Encounter (Signed)
printed

## 2019-08-29 ENCOUNTER — Telehealth: Payer: Self-pay | Admitting: Internal Medicine

## 2019-08-29 DIAGNOSIS — K7460 Unspecified cirrhosis of liver: Secondary | ICD-10-CM | POA: Diagnosis not present

## 2019-08-29 NOTE — Telephone Encounter (Signed)
Pt said the medication Aliene Altes, PA put her on was not working. Doesn't know the name of medicine. [phone]

## 2019-08-29 NOTE — Telephone Encounter (Signed)
Is she still taking imodium with meals? If so, I really need her to stop imodium to see if she ends up with watery stool. If she has watery stool, we will need her to complete C. Diff testing that was not completed previously. I am concerned that the worsening diarrhea and abdominal pain could be secondary to C. Diff as she has recently completed 2 rounds of antibiotics for UTI.

## 2019-08-29 NOTE — Telephone Encounter (Signed)
Spoke with pt. Pt called with a progress report of taking Creon. Pt feels since she has been taking the Creon, her abdominal pain and BMS have worsened. Pt is also taking Prilosec 40 mg daily and Imodium as needed. Pt is having 5-6 bowel movements that are soft and mushy per pt. BM isn't watery or completely formed. Pt is having lower abdominal pain that goes across pts lower abdomen. Pt says when she goes out in public, she has to rush back to have a bowel movement. Per pt, she doesn't always make it to the bathroom before having a bowel movement on herself.

## 2019-08-30 LAB — CBC WITH DIFFERENTIAL/PLATELET
Absolute Monocytes: 99 cells/uL — ABNORMAL LOW (ref 200–950)
Basophils Absolute: 21 cells/uL (ref 0–200)
Basophils Relative: 0.4 %
Eosinophils Absolute: 88 cells/uL (ref 15–500)
Eosinophils Relative: 1.7 %
HCT: 37.7 % (ref 35.0–45.0)
Hemoglobin: 13 g/dL (ref 11.7–15.5)
Lymphs Abs: 848 cells/uL — ABNORMAL LOW (ref 850–3900)
MCH: 31.1 pg (ref 27.0–33.0)
MCHC: 34.5 g/dL (ref 32.0–36.0)
MCV: 90.2 fL (ref 80.0–100.0)
MPV: 10.7 fL (ref 7.5–12.5)
Monocytes Relative: 1.9 %
Neutro Abs: 4144 cells/uL (ref 1500–7800)
Neutrophils Relative %: 79.7 %
Platelets: 99 10*3/uL — ABNORMAL LOW (ref 140–400)
RBC: 4.18 10*6/uL (ref 3.80–5.10)
RDW: 13.4 % (ref 11.0–15.0)
Total Lymphocyte: 16.3 %
WBC: 5.2 10*3/uL (ref 3.8–10.8)

## 2019-08-30 LAB — COMPLETE METABOLIC PANEL WITH GFR
AG Ratio: 1.7 (calc) (ref 1.0–2.5)
ALT: 8 U/L (ref 6–29)
AST: 14 U/L (ref 10–35)
Albumin: 4.1 g/dL (ref 3.6–5.1)
Alkaline phosphatase (APISO): 106 U/L (ref 37–153)
BUN: 9 mg/dL (ref 7–25)
CO2: 25 mmol/L (ref 20–32)
Calcium: 9.1 mg/dL (ref 8.6–10.4)
Chloride: 106 mmol/L (ref 98–110)
Creat: 0.81 mg/dL (ref 0.50–0.99)
GFR, Est African American: 86 mL/min/{1.73_m2} (ref >=60)
GFR, Est Non African American: 75 mL/min/{1.73_m2} (ref >=60)
Globulin: 2.4 g/dL (calc) (ref 1.9–3.7)
Glucose, Bld: 118 mg/dL — ABNORMAL HIGH (ref 65–99)
Potassium: 4.4 mmol/L (ref 3.5–5.3)
Sodium: 138 mmol/L (ref 135–146)
Total Bilirubin: 0.7 mg/dL (ref 0.2–1.2)
Total Protein: 6.5 g/dL (ref 6.1–8.1)

## 2019-08-30 LAB — PROTIME-INR
INR: 1
Prothrombin Time: 11 s (ref 9.0–11.5)

## 2019-08-30 LAB — TSH: TSH: 2.96 mIU/L (ref 0.40–4.50)

## 2019-08-30 LAB — AFP TUMOR MARKER: AFP-Tumor Marker: 2.4 ng/mL

## 2019-08-30 LAB — LITHIUM LEVEL: Lithium Lvl: 0.8 mmol/L (ref 0.6–1.2)

## 2019-08-30 NOTE — Telephone Encounter (Signed)
Spoke with pt. Pt is taking Imodium with meals and was advised to d/c. Pt is going to monitor her stool after stopping the Imodium to see if her stool gets watery. Pt will call back with an update.

## 2019-08-30 NOTE — Telephone Encounter (Signed)
Lmom, waiting on a return call.  

## 2019-08-30 NOTE — Telephone Encounter (Signed)
Noted  

## 2019-08-31 NOTE — Progress Notes (Signed)
Please let patient know her labs look good. Platelets remain low in the setting of cirrhosis but are stable. Hemoglobin, kidney function, electrolytes, and LFTs all within normal limits.   Labs correspond to MELD 6 which represents good hepatic function.

## 2019-08-31 NOTE — Progress Notes (Signed)
Please let her know labs are wnls. I also reviewed cmp in chart

## 2019-09-12 DIAGNOSIS — E1165 Type 2 diabetes mellitus with hyperglycemia: Secondary | ICD-10-CM | POA: Diagnosis not present

## 2019-09-12 DIAGNOSIS — E7849 Other hyperlipidemia: Secondary | ICD-10-CM | POA: Diagnosis not present

## 2019-09-12 DIAGNOSIS — K589 Irritable bowel syndrome without diarrhea: Secondary | ICD-10-CM | POA: Diagnosis not present

## 2019-09-12 DIAGNOSIS — Z7984 Long term (current) use of oral hypoglycemic drugs: Secondary | ICD-10-CM | POA: Diagnosis not present

## 2019-09-13 DIAGNOSIS — R300 Dysuria: Secondary | ICD-10-CM | POA: Diagnosis not present

## 2019-09-13 DIAGNOSIS — K589 Irritable bowel syndrome without diarrhea: Secondary | ICD-10-CM | POA: Diagnosis not present

## 2019-09-25 DIAGNOSIS — R319 Hematuria, unspecified: Secondary | ICD-10-CM | POA: Diagnosis not present

## 2019-09-25 DIAGNOSIS — R300 Dysuria: Secondary | ICD-10-CM | POA: Diagnosis not present

## 2019-09-25 DIAGNOSIS — Z6824 Body mass index (BMI) 24.0-24.9, adult: Secondary | ICD-10-CM | POA: Diagnosis not present

## 2019-09-25 DIAGNOSIS — N390 Urinary tract infection, site not specified: Secondary | ICD-10-CM | POA: Diagnosis not present

## 2019-10-05 DIAGNOSIS — Z1231 Encounter for screening mammogram for malignant neoplasm of breast: Secondary | ICD-10-CM | POA: Diagnosis not present

## 2019-10-12 DIAGNOSIS — E7849 Other hyperlipidemia: Secondary | ICD-10-CM | POA: Diagnosis not present

## 2019-10-12 DIAGNOSIS — Z7984 Long term (current) use of oral hypoglycemic drugs: Secondary | ICD-10-CM | POA: Diagnosis not present

## 2019-10-12 DIAGNOSIS — E1165 Type 2 diabetes mellitus with hyperglycemia: Secondary | ICD-10-CM | POA: Diagnosis not present

## 2019-10-17 DIAGNOSIS — K7460 Unspecified cirrhosis of liver: Secondary | ICD-10-CM | POA: Diagnosis not present

## 2019-10-17 DIAGNOSIS — E1169 Type 2 diabetes mellitus with other specified complication: Secondary | ICD-10-CM | POA: Diagnosis not present

## 2019-10-17 DIAGNOSIS — E7800 Pure hypercholesterolemia, unspecified: Secondary | ICD-10-CM | POA: Diagnosis not present

## 2019-10-17 DIAGNOSIS — E782 Mixed hyperlipidemia: Secondary | ICD-10-CM | POA: Diagnosis not present

## 2019-10-19 DIAGNOSIS — K766 Portal hypertension: Secondary | ICD-10-CM | POA: Diagnosis not present

## 2019-10-19 DIAGNOSIS — R300 Dysuria: Secondary | ICD-10-CM | POA: Diagnosis not present

## 2019-10-19 DIAGNOSIS — N390 Urinary tract infection, site not specified: Secondary | ICD-10-CM | POA: Diagnosis not present

## 2019-10-19 DIAGNOSIS — E1169 Type 2 diabetes mellitus with other specified complication: Secondary | ICD-10-CM | POA: Diagnosis not present

## 2019-10-19 DIAGNOSIS — Z23 Encounter for immunization: Secondary | ICD-10-CM | POA: Diagnosis not present

## 2019-10-19 DIAGNOSIS — K589 Irritable bowel syndrome without diarrhea: Secondary | ICD-10-CM | POA: Diagnosis not present

## 2019-10-23 ENCOUNTER — Ambulatory Visit: Payer: Medicare Other | Admitting: Gastroenterology

## 2019-10-23 ENCOUNTER — Encounter: Payer: Self-pay | Admitting: Urology

## 2019-10-23 ENCOUNTER — Other Ambulatory Visit: Payer: Self-pay

## 2019-10-23 ENCOUNTER — Ambulatory Visit (INDEPENDENT_AMBULATORY_CARE_PROVIDER_SITE_OTHER): Payer: Medicare Other | Admitting: Urology

## 2019-10-23 VITALS — BP 134/68 | HR 62 | Temp 98.5°F

## 2019-10-23 DIAGNOSIS — N390 Urinary tract infection, site not specified: Secondary | ICD-10-CM | POA: Diagnosis not present

## 2019-10-23 LAB — MICROSCOPIC EXAMINATION
Epithelial Cells (non renal): NONE SEEN /HPF (ref 0–10)
RBC, Urine: NONE SEEN /HPF (ref 0–2)
Renal Epithel, UA: NONE SEEN /HPF

## 2019-10-23 LAB — URINALYSIS, ROUTINE W REFLEX MICROSCOPIC
Bilirubin, UA: NEGATIVE
Glucose, UA: NEGATIVE
Leukocytes,UA: NEGATIVE
Nitrite, UA: NEGATIVE
RBC, UA: NEGATIVE
Specific Gravity, UA: 1.015 (ref 1.005–1.030)
Urobilinogen, Ur: 1 mg/dL (ref 0.2–1.0)
pH, UA: 5.5 (ref 5.0–7.5)

## 2019-10-23 LAB — BLADDER SCAN AMB NON-IMAGING: Scan Result: 27

## 2019-10-23 MED ORDER — NITROFURANTOIN MACROCRYSTAL 50 MG PO CAPS: 50.0000 mg | ORAL_CAPSULE | Freq: Every day | ORAL | 11 refills | Status: DC

## 2019-10-23 NOTE — Patient Instructions (Signed)
Urinary Tract Infection, Adult A urinary tract infection (UTI) is an infection of any part of the urinary tract. The urinary tract includes:  The kidneys.  The ureters.  The bladder.  The urethra. These organs make, store, and get rid of pee (urine) in the body. What are the causes? This is caused by germs (bacteria) in your genital area. These germs grow and cause swelling (inflammation) of your urinary tract. What increases the risk? You are more likely to develop this condition if:  You have a small, thin tube (catheter) to drain pee.  You cannot control when you pee or poop (incontinence).  You are female, and: ? You use these methods to prevent pregnancy:  A medicine that kills sperm (spermicide).  A device that blocks sperm (diaphragm). ? You have low levels of a female hormone (estrogen). ? You are pregnant.  You have genes that add to your risk.  You are sexually active.  You take antibiotic medicines.  You have trouble peeing because of: ? A prostate that is bigger than normal, if you are female. ? A blockage in the part of your body that drains pee from the bladder (urethra). ? A kidney stone. ? A nerve condition that affects your bladder (neurogenic bladder). ? Not getting enough to drink. ? Not peeing often enough.  You have other conditions, such as: ? Diabetes. ? A weak disease-fighting system (immune system). ? Sickle cell disease. ? Gout. ? Injury of the spine. What are the signs or symptoms? Symptoms of this condition include:  Needing to pee right away (urgently).  Peeing often.  Peeing small amounts often.  Pain or burning when peeing.  Blood in the pee.  Pee that smells bad or not like normal.  Trouble peeing.  Pee that is cloudy.  Fluid coming from the vagina, if you are female.  Pain in the belly or lower back. Other symptoms include:  Throwing up (vomiting).  No urge to eat.  Feeling mixed up (confused).  Being tired  and grouchy (irritable).  A fever.  Watery poop (diarrhea). How is this treated? This condition may be treated with:  Antibiotic medicine.  Other medicines.  Drinking enough water. Follow these instructions at home:  Medicines  Take over-the-counter and prescription medicines only as told by your doctor.  If you were prescribed an antibiotic medicine, take it as told by your doctor. Do not stop taking it even if you start to feel better. General instructions  Make sure you: ? Pee until your bladder is empty. ? Do not hold pee for a long time. ? Empty your bladder after sex. ? Wipe from front to back after pooping if you are a female. Use each tissue one time when you wipe.  Drink enough fluid to keep your pee pale yellow.  Keep all follow-up visits as told by your doctor. This is important. Contact a doctor if:  You do not get better after 1-2 days.  Your symptoms go away and then come back. Get help right away if:  You have very bad back pain.  You have very bad pain in your lower belly.  You have a fever.  You are sick to your stomach (nauseous).  You are throwing up. Summary  A urinary tract infection (UTI) is an infection of any part of the urinary tract.  This condition is caused by germs in your genital area.  There are many risk factors for a UTI. These include having a small, thin   tube to drain pee and not being able to control when you pee or poop.  Treatment includes antibiotic medicines for germs.  Drink enough fluid to keep your pee pale yellow. This information is not intended to replace advice given to you by your health care provider. Make sure you discuss any questions you have with your health care provider. Document Revised: 12/16/2017 Document Reviewed: 07/08/2017 Elsevier Patient Education  2020 Elsevier Inc.  

## 2019-10-23 NOTE — Progress Notes (Signed)
10/23/2019 1:08 PM   Andrea Dickson [birth year] [id number]  Referring provider: Curlene Labrum, MD [place],  [place] [address]  Recurrent UTI  HPI: Andrea Dickson is a 29ZM here for evaluation of recurrent UTI. For the past 6 weeks she noted dysuria and was treated with bactrim, macrobid, and cipro. Her dysuria improved while on the antibiotics and then returned soon after finishing each antibiotic. She is now on nitrofurintoin qhs. She has diarrhea and over the past 2 months she has started wearing depends. No hx of nephrolithiasis. She had a CT  Abd/pelvis in 01/2019 which showed no GU abnormalities.     PMH: Past Medical History:  Diagnosis Date  . Anxiety   . Asthma   . Bipolar 1 disorder ([place])   . Chronic diarrhea   . Cirrhosis [facility])    Diagnosed September 2020, likely secondary to [facility], s/p Hep A/B vaccination in 2021.   Marland Kitchen Depression   . Diabetes ([place])   . Essential tremor   . GERD (gastroesophageal reflux disease)   . Headache   . High cholesterol     Surgical History: Past Surgical History:  Procedure Laterality Date  . ABDOMINAL HYSTERECTOMY  1997  . BIOPSY  01/17/2018   Procedure: BIOPSY;  Surgeon: Andrea Dolin, MD;  Location: [facility];  Service: Endoscopy;;  colon  . carpal tunnel Bilateral 1999, 06/2009  . COLONOSCOPY  2013   Dr. Britta Dickson: no colon polyps. quality of prep was fair. repeat colonoscopy in five years.   . COLONOSCOPY WITH PROPOFOL N/A 01/17/2018   Dr. Gala Dickson: Colonic lipoma, 2 tubular adenomas removed.  Random colon biopsies negative.  Next colonoscopy 5 years.  . ESOPHAGOGASTRODUODENOSCOPY (EGD) WITH PROPOFOL N/A 04/06/2019   Procedure: ESOPHAGOGASTRODUODENOSCOPY (EGD) WITH PROPOFOL;  Surgeon: Andrea Dolin, MD;   Normal esophagus, mild portal hypertensive gastropathy, otherwise normal exam.  Recommend repeat EGD in 2 years for variceal screening.   Marland Kitchen POLYPECTOMY  01/17/2018   Procedure: POLYPECTOMY;  Surgeon: Andrea Dolin, MD;   Location: [facility];  Service: Endoscopy;;  colon  . SKIN CANCER EXCISION  2007  . TONSILLECTOMY  1965  . ulner nerve  Left 06/2009   decompression    Home Medications:  Allergies as of 10/23/2019      Reactions   Pramipexole Other (See Comments)   Shaking, palpitations, headache, faint feeling   Crestor [rosuvastatin Calcium]    Muscle pain   Pollen Extract Other (See Comments)   Eyes and nose run      Medication List       Accurate as of October 23, 2019  1:08 PM. If you have any questions, ask your nurse or doctor.        acetaminophen 500 MG tablet Commonly known as: TYLENOL Take 1,000 mg by mouth 2 (two) times daily as needed for moderate pain or headache.   buPROPion 150 MG 24 hr tablet Commonly known as: WELLBUTRIN XL TAKE ONE TABLET BY MOUTH EVERY MORNING   cetirizine 10 MG tablet Commonly known as: ZYRTEC Take 10 mg by mouth daily.   diazepam 10 MG tablet Commonly known as: Valium Take 1 tablet (10 mg total) by mouth at bedtime.   diazepam 2 MG tablet Commonly known as: Valium Take 1 tablet (2 mg total) by mouth 2 (two) times daily as needed for anxiety.   estrogens (conjugated) 1.25 MG tablet Commonly known as: PREMARIN Take 1.25 mg by mouth daily.   ezetimibe  10 MG tablet Commonly known as: ZETIA Take 10 mg by mouth daily.   lithium 300 MG tablet TAKE TWO TABLETS BY MOUTH EVERYDAY AT BEDTIME   loperamide 2 MG capsule Commonly known as: IMODIUM Take 2 mg by mouth as needed for diarrhea or loose stools.   metFORMIN 500 MG tablet Commonly known as: GLUCOPHAGE Take 500 mg by mouth in the morning and at bedtime.   methocarbamol 500 MG tablet Commonly known as: ROBAXIN Take 500 mg by mouth daily as needed for muscle spasms.   nitrofurantoin 50 MG capsule Commonly known as: MACRODANTIN Take 50 mg by mouth daily.   omeprazole 40 MG capsule Commonly known as: PRILOSEC Take 40 mg by mouth daily.   ondansetron 4 MG tablet Commonly known  as: ZOFRAN TAKE 1 TABLET BY MOUTH EVERY 8 HOURS AS NEEDED FOR NAUSEA AND VOMITING What changed:   how much to take  how to take this  when to take this  reasons to take this  additional instructions   propranolol ER 60 MG 24 hr capsule Commonly known as: INDERAL LA Take 1 capsule (60 mg total) by mouth every morning.   Vitamin D3 50 MCG (2000 UT) Tabs Take 2,000 Units by mouth daily.       Allergies:  Allergies  Allergen Reactions  . Pramipexole Other (See Comments)    Shaking, palpitations, headache, faint feeling  . Crestor [Rosuvastatin Calcium]     Muscle pain  . Pollen Extract Other (See Comments)    Eyes and nose run    Family History: Family History  Problem Relation Age of Onset  . Bipolar disorder Brother   . Diabetes Brother   . Heart disease Brother   . Diabetes Father   . Heart attack Father   . Heart disease Father   . Diabetes Sister   . Diabetes Mother   . Brain cancer Mother   . Colon cancer Neg Hx     Social History:  reports that she has been smoking cigarettes. She has a 21.50 pack-year smoking history. She has never used smokeless tobacco. She reports that she does not drink alcohol and does not use drugs.  ROS: All other review of systems were reviewed and are negative except what is noted above in HPI  Physical Exam: BP 134/68   Pulse 62   Temp 98.5 F (36.9 C)   Constitutional:  Alert and oriented, No acute distress. HEENT: [place] AT, moist mucus membranes.  Trachea midline, no masses. Cardiovascular: No clubbing, cyanosis, or edema. Respiratory: Normal respiratory effort, no increased work of breathing. GI: Abdomen is soft, nontender, nondistended, no abdominal masses GU: No CVA tenderness.  Lymph: No cervical or inguinal lymphadenopathy. Skin: No rashes, bruises or suspicious lesions. Neurologic: Grossly intact, no focal deficits, moving all 4 extremities. Psychiatric: Normal mood and affect.  Laboratory Data: Lab Results    Component Value Date   WBC 5.2 08/29/2019   HGB 13.0 08/29/2019   HCT 37.7 08/29/2019   MCV 90.2 08/29/2019   PLT 99 (L) 08/29/2019    Lab Results  Component Value Date   CREATININE 0.81 08/29/2019    No results found for: PSA  No results found for: TESTOSTERONE  Lab Results  Component Value Date   HGBA1C 5.4 04/24/2015    Urinalysis    Component Value Date/Time   BILIRUBINUR SMALL 10/08/2015 1347   PROTEINUR 30 10/08/2015 1347   UROBILINOGEN 1.0 10/08/2015 1347   NITRITE POS 10/08/2015 1347   LEUKOCYTESUR  small (1+) (A) 10/08/2015 1347    No results found for: LABMICR, [place], RBCUA, LABEPIT, MUCUS, BACTERIA  Pertinent Imaging: CT abd pelvis: 02/02/2019: Images reviewed and discussed with the patient No results found for this or any previous visit.  No results found for this or any previous visit.  No results found for this or any previous visit.  No results found for this or any previous visit.  No results found for this or any previous visit.  No results found for this or any previous visit.  No results found for this or any previous visit.  No results found for this or any previous visit.   Assessment & Plan:    1. Recurrent UTI -Continue macrobid 50mg   -RTC 3 months or sooner if she develops UTI symptoms - BLADDER SCAN AMB NON-IMAGING - Urinalysis, Routine w reflex microscopic   No follow-ups on file.  Nicolette Bang, MD  [facility] [place]

## 2019-10-23 NOTE — Progress Notes (Signed)
Urological Symptom Review  Patient is experiencing the following symptoms: Frequent urination Hard to postpone urination Get up at night to urinate Stream starts and stops Trouble starting stream Urinary tract infection   Review of Systems  Gastrointestinal (upper)  : Indigestion/heartburn  Gastrointestinal (lower) : Diarrhea  Constitutional : Fatigue  Skin: Negative for skin symptoms  Eyes: Negative for eye symptoms  Ear/Nose/Throat : Negative for Ear/Nose/Throat symptoms  Hematologic/Lymphatic: Negative for Hematologic/Lymphatic symptoms  Cardiovascular : Negative for cardiovascular symptoms  Respiratory : Negative for respiratory symptoms  Endocrine: Excessive thirst  Musculoskeletal: Negative for musculoskeletal symptoms  Neurological: Headaches  Psychologic: Depression Anxiety

## 2019-10-27 ENCOUNTER — Telehealth (HOSPITAL_COMMUNITY): Payer: Medicare Other | Admitting: Psychiatry

## 2019-10-30 DIAGNOSIS — F17210 Nicotine dependence, cigarettes, uncomplicated: Secondary | ICD-10-CM | POA: Diagnosis not present

## 2019-10-31 ENCOUNTER — Other Ambulatory Visit: Payer: Self-pay

## 2019-10-31 ENCOUNTER — Encounter (HOSPITAL_COMMUNITY): Payer: Self-pay | Admitting: Psychiatry

## 2019-10-31 ENCOUNTER — Telehealth (INDEPENDENT_AMBULATORY_CARE_PROVIDER_SITE_OTHER): Payer: Medicare Other | Admitting: Psychiatry

## 2019-10-31 DIAGNOSIS — F3162 Bipolar disorder, current episode mixed, moderate: Secondary | ICD-10-CM

## 2019-10-31 MED ORDER — PROPRANOLOL HCL ER 60 MG PO CP24
60.0000 mg | ORAL_CAPSULE | Freq: Every morning | ORAL | 2 refills | Status: DC
Start: 2019-10-31 — End: 2020-01-24

## 2019-10-31 MED ORDER — DIAZEPAM 10 MG PO TABS
10.0000 mg | ORAL_TABLET | Freq: Every day | ORAL | 2 refills | Status: DC
Start: 2019-10-31 — End: 2020-02-01

## 2019-10-31 MED ORDER — LITHIUM CARBONATE 300 MG PO TABS
ORAL_TABLET | ORAL | 2 refills | Status: DC
Start: 2019-10-31 — End: 2020-01-24

## 2019-10-31 MED ORDER — BUPROPION HCL ER (XL) 150 MG PO TB24
ORAL_TABLET | ORAL | 2 refills | Status: DC
Start: 2019-10-31 — End: 2020-01-24

## 2019-10-31 MED ORDER — DIAZEPAM 2 MG PO TABS: 2.0000 mg | ORAL_TABLET | Freq: Two times a day (BID) | ORAL | 2 refills | Status: DC | PRN
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
## 2019-11-01 ENCOUNTER — Ambulatory Visit: Payer: Medicare Other | Admitting: Urology

## 2019-11-11 DIAGNOSIS — E7849 Other hyperlipidemia: Secondary | ICD-10-CM | POA: Diagnosis not present

## 2019-11-11 DIAGNOSIS — Z7984 Long term (current) use of oral hypoglycemic drugs: Secondary | ICD-10-CM | POA: Diagnosis not present

## 2019-11-11 DIAGNOSIS — E1165 Type 2 diabetes mellitus with hyperglycemia: Secondary | ICD-10-CM | POA: Diagnosis not present

## 2019-12-12 DIAGNOSIS — E7849 Other hyperlipidemia: Secondary | ICD-10-CM | POA: Diagnosis not present

## 2019-12-12 DIAGNOSIS — Z7984 Long term (current) use of oral hypoglycemic drugs: Secondary | ICD-10-CM | POA: Diagnosis not present

## 2019-12-12 DIAGNOSIS — E1165 Type 2 diabetes mellitus with hyperglycemia: Secondary | ICD-10-CM | POA: Diagnosis not present

## 2019-12-14 DIAGNOSIS — S80212A Abrasion, left knee, initial encounter: Secondary | ICD-10-CM | POA: Diagnosis not present

## 2019-12-14 DIAGNOSIS — S20219A Contusion of unspecified front wall of thorax, initial encounter: Secondary | ICD-10-CM | POA: Diagnosis not present

## 2019-12-26 DIAGNOSIS — Z23 Encounter for immunization: Secondary | ICD-10-CM | POA: Diagnosis not present

## 2019-12-27 ENCOUNTER — Telehealth: Payer: Self-pay | Admitting: Urology

## 2019-12-27 DIAGNOSIS — R300 Dysuria: Secondary | ICD-10-CM | POA: Diagnosis not present

## 2019-12-27 NOTE — Telephone Encounter (Signed)
Patient called and LM asking if she can bring in a urine specimen today?

## 2019-12-27 NOTE — Telephone Encounter (Signed)
Returned pt call- no answer. Left message for pt to come in am for sample

## 2019-12-27 NOTE — Telephone Encounter (Signed)
Just Fyi. She may come tomorrow am- I left a message for her.

## 2020-01-09 DIAGNOSIS — J209 Acute bronchitis, unspecified: Secondary | ICD-10-CM | POA: Diagnosis not present

## 2020-01-11 ENCOUNTER — Telehealth: Payer: Self-pay | Admitting: Internal Medicine

## 2020-01-11 DIAGNOSIS — J019 Acute sinusitis, unspecified: Secondary | ICD-10-CM | POA: Diagnosis not present

## 2020-01-11 DIAGNOSIS — J209 Acute bronchitis, unspecified: Secondary | ICD-10-CM | POA: Diagnosis not present

## 2020-01-11 NOTE — Telephone Encounter (Signed)
Recall for ultrasound 

## 2020-01-11 NOTE — Telephone Encounter (Signed)
Recall sent 

## 2020-01-12 DIAGNOSIS — M810 Age-related osteoporosis without current pathological fracture: Secondary | ICD-10-CM | POA: Diagnosis not present

## 2020-01-12 DIAGNOSIS — E7849 Other hyperlipidemia: Secondary | ICD-10-CM | POA: Diagnosis not present

## 2020-01-12 DIAGNOSIS — H409 Unspecified glaucoma: Secondary | ICD-10-CM | POA: Diagnosis not present

## 2020-01-18 DIAGNOSIS — K766 Portal hypertension: Secondary | ICD-10-CM | POA: Diagnosis not present

## 2020-01-18 DIAGNOSIS — R197 Diarrhea, unspecified: Secondary | ICD-10-CM | POA: Diagnosis not present

## 2020-01-18 DIAGNOSIS — R944 Abnormal results of kidney function studies: Secondary | ICD-10-CM | POA: Diagnosis not present

## 2020-01-18 DIAGNOSIS — K760 Fatty (change of) liver, not elsewhere classified: Secondary | ICD-10-CM | POA: Diagnosis not present

## 2020-01-18 DIAGNOSIS — K589 Irritable bowel syndrome without diarrhea: Secondary | ICD-10-CM | POA: Diagnosis not present

## 2020-01-18 DIAGNOSIS — E1169 Type 2 diabetes mellitus with other specified complication: Secondary | ICD-10-CM | POA: Diagnosis not present

## 2020-01-18 DIAGNOSIS — N390 Urinary tract infection, site not specified: Secondary | ICD-10-CM | POA: Diagnosis not present

## 2020-01-23 ENCOUNTER — Encounter: Payer: Self-pay | Admitting: Urology

## 2020-01-23 ENCOUNTER — Ambulatory Visit (INDEPENDENT_AMBULATORY_CARE_PROVIDER_SITE_OTHER): Payer: Medicare Other | Admitting: Urology

## 2020-01-23 ENCOUNTER — Other Ambulatory Visit: Payer: Self-pay

## 2020-01-23 VITALS — BP 122/73 | HR 69 | Temp 98.2°F | Ht 67.0 in | Wt 157.0 lb

## 2020-01-23 DIAGNOSIS — N390 Urinary tract infection, site not specified: Secondary | ICD-10-CM | POA: Diagnosis not present

## 2020-01-23 LAB — BLADDER SCAN AMB NON-IMAGING: Scan Result: 72

## 2020-01-23 MED ORDER — NITROFURANTOIN MACROCRYSTAL 50 MG PO CAPS: 50.0000 mg | ORAL_CAPSULE | Freq: Every day | ORAL | 3 refills | Status: DC

## 2020-01-23 NOTE — Progress Notes (Signed)
01/23/2020 3:21 PM   [name] [birth year] [id number]  Referring provider: Curlene Labrum, MD [place],  [place] [address]  Followup recurrent UTI  HPI: Ms Alcantara is a [id number] here for followup for recurrent UTI. She is currently on macrobid 50mg  qhs. She had 1 UTI since last visit which was treated with cipro. She denies any LUTS. No dysuria or hematuria. No worsening incontinence.   PMH: Past Medical History:  Diagnosis Date  . Anxiety   . Asthma   . Bipolar 1 disorder ([place])   . Chronic diarrhea   . Cirrhosis [facility])    Diagnosed September 2020, likely secondary to [facility], s/p Hep A/B vaccination in 2021.   Marland Kitchen Depression   . Diabetes ([place])   . Essential tremor   . GERD (gastroesophageal reflux disease)   . Headache   . High cholesterol     Surgical History: Past Surgical History:  Procedure Laterality Date  . ABDOMINAL HYSTERECTOMY  1997  . BIOPSY  01/17/2018   Procedure: BIOPSY;  Surgeon: Daneil Dolin, MD;  [facility];  Service: Endoscopy;;  colon  . carpal tunnel Bilateral 1999, 06/2009  . COLONOSCOPY  2013   Dr. Britta Mccreedy: no colon polyps. quality of prep was fair. repeat colonoscopy in five years.   . COLONOSCOPY WITH PROPOFOL N/A 01/17/2018   Dr. Gala Romney: Colonic lipoma, 2 tubular adenomas removed.  Random colon biopsies negative.  Next colonoscopy 5 years.  . ESOPHAGOGASTRODUODENOSCOPY (EGD) WITH PROPOFOL N/A 04/06/2019   Procedure: ESOPHAGOGASTRODUODENOSCOPY (EGD) WITH PROPOFOL;  Surgeon: Daneil Dolin, MD;   Normal esophagus, mild portal hypertensive gastropathy, otherwise normal exam.  Recommend repeat EGD in 2 years for variceal screening.   Marland Kitchen POLYPECTOMY  01/17/2018   Procedure: POLYPECTOMY;  Surgeon: Daneil Dolin, MD;  [facility];  Service: Endoscopy;;  colon  . SKIN CANCER EXCISION  2007  . TONSILLECTOMY  1965  . ulner nerve  Left 06/2009   decompression    Home Medications:  Allergies as of 01/23/2020       Reactions   Pramipexole Other (See Comments)   Shaking, palpitations, headache, faint feeling   Crestor [rosuvastatin Calcium]    Muscle pain   Pollen Extract Other (See Comments)   Eyes and nose run      Medication List       Accurate as of January 23, 2020  3:21 PM. If you have any questions, ask your nurse or doctor.        acetaminophen 500 MG tablet Commonly known as: TYLENOL Take 1,000 mg by mouth 2 (two) times daily as needed for moderate pain or headache.   buPROPion 150 MG 24 hr tablet Commonly known as: WELLBUTRIN XL TAKE ONE TABLET BY MOUTH EVERY MORNING   cetirizine 10 MG tablet Commonly known as: ZYRTEC Take 10 mg by mouth daily.   diazepam 10 MG tablet Commonly known as: Valium Take 1 tablet (10 mg total) by mouth at bedtime.   diazepam 2 MG tablet Commonly known as: Valium Take 1 tablet (2 mg total) by mouth 2 (two) times daily as needed for anxiety.   estrogens (conjugated) 1.25 MG tablet Commonly known as: PREMARIN Take 1.25 mg by mouth daily.   ezetimibe 10 MG tablet Commonly known as: ZETIA Take 10 mg by mouth daily.   IGlucose Test Strips test strip Generic drug: glucose blood daily. use as directed   lithium 300 MG tablet TAKE TWO TABLETS BY MOUTH  EVERYDAY AT BEDTIME   loperamide 2 MG capsule Commonly known as: IMODIUM Take 2 mg by mouth as needed for diarrhea or loose stools.   metFORMIN 500 MG tablet Commonly known as: GLUCOPHAGE Take 500 mg by mouth in the morning and at bedtime.   methocarbamol 500 MG tablet Commonly known as: ROBAXIN Take 500 mg by mouth daily as needed for muscle spasms.   nitrofurantoin 50 MG capsule Commonly known as: MACRODANTIN Take 1 capsule (50 mg total) by mouth daily.   omeprazole 40 MG capsule Commonly known as: PRILOSEC Take 40 mg by mouth daily.   ondansetron 4 MG tablet Commonly known as: ZOFRAN TAKE 1 TABLET BY MOUTH EVERY 8 HOURS AS NEEDED FOR NAUSEA AND VOMITING What changed:   how  much to take  how to take this  when to take this  reasons to take this  additional instructions   propranolol ER 60 MG 24 hr capsule Commonly known as: INDERAL LA Take 1 capsule (60 mg total) by mouth every morning.   Vitamin D3 50 MCG (2000 UT) Tabs Take 2,000 Units by mouth daily.       Allergies:  Allergies  Allergen Reactions  . Pramipexole Other (See Comments)    Shaking, palpitations, headache, faint feeling  . Crestor [Rosuvastatin Calcium]     Muscle pain  . Pollen Extract Other (See Comments)    Eyes and nose run    Family History: Family History  Problem Relation Age of Onset  . Bipolar disorder Brother   . Diabetes Brother   . Heart disease Brother   . Diabetes Father   . Heart attack Father   . Heart disease Father   . Diabetes Sister   . Diabetes Mother   . Brain cancer Mother   . Colon cancer Neg Hx     Social History:  reports that she has been smoking cigarettes. She has a 21.50 pack-year smoking history. She has never used smokeless tobacco. She reports that she does not drink alcohol and does not use drugs.  ROS: All other review of systems were reviewed and are negative except what is noted above in HPI  Physical Exam: BP 122/73   Pulse 69   Temp 98.2 F (36.8 C)   Ht 5\' 7"  (1.702 m)   Wt 157 lb (71.2 kg)   BMI 24.59 kg/m   Constitutional:  Alert and oriented, No acute distress. HEENT: [place] AT, moist mucus membranes.  Trachea midline, no masses. Cardiovascular: No clubbing, cyanosis, or edema. Respiratory: Normal respiratory effort, no increased work of breathing. GI: Abdomen is soft, nontender, nondistended, no abdominal masses GU: No CVA tenderness.  Lymph: No cervical or inguinal lymphadenopathy. Skin: No rashes, bruises or suspicious lesions. Neurologic: Grossly intact, no focal deficits, moving all 4 extremities. Psychiatric: Normal mood and affect.  Laboratory Data: Lab Results  Component Value Date   WBC 5.2 08/29/2019    HGB 13.0 08/29/2019   HCT 37.7 08/29/2019   MCV 90.2 08/29/2019   PLT 99 (L) 08/29/2019    Lab Results  Component Value Date   CREATININE 0.81 08/29/2019    No results found for: PSA  No results found for: TESTOSTERONE  Lab Results  Component Value Date   HGBA1C 5.4 04/24/2015    Urinalysis    Component Value Date/Time   APPEARANCEUR Clear 10/23/2019 1319   GLUCOSEU Negative 10/23/2019 1319   BILIRUBINUR Negative 10/23/2019 1319   PROTEINUR 1+ (A) 10/23/2019 1319   UROBILINOGEN 1.0 10/08/2015  1347   NITRITE Negative 10/23/2019 1319   LEUKOCYTESUR Negative 10/23/2019 1319    Lab Results  Component Value Date   LABMICR See below: 10/23/2019   WBCUA 0-5 10/23/2019   LABEPIT None seen 10/23/2019   BACTERIA Few 10/23/2019    Pertinent Imaging:  No results found for this or any previous visit.  No results found for this or any previous visit.  No results found for this or any previous visit.  No results found for this or any previous visit.  No results found for this or any previous visit.  No results found for this or any previous visit.  No results found for this or any previous visit.  No results found for this or any previous visit.   Assessment & Plan:    1. Recurrent UTI Continue macrobid 50mg  qhs. RTC 6 months - BLADDER SCAN AMB NON-IMAGING   No follow-ups on file.  Nicolette Bang, MD  [facility] [place]

## 2020-01-23 NOTE — Patient Instructions (Signed)
Urinary Tract Infection, Adult  A urinary tract infection (UTI) is an infection of any part of the urinary tract. The urinary tract includes the kidneys, ureters, bladder, and urethra. These organs make, store, and get rid of urine in the body. An upper UTI affects the ureters and kidneys. A lower UTI affects the bladder and urethra. What are the causes? Most urinary tract infections are caused by bacteria in your genital area around your urethra, where urine leaves your body. These bacteria grow and cause inflammation of your urinary tract. What increases the risk? You are more likely to develop this condition if:  You have a urinary catheter that stays in place.  You are not able to control when you urinate or have a bowel movement (incontinence).  You are female and you: ? Use a spermicide or diaphragm for birth control. ? Have low estrogen levels. ? Are pregnant.  You have certain genes that increase your risk.  You are sexually active.  You take antibiotic medicines.  You have a condition that causes your flow of urine to slow down, such as: ? An enlarged prostate, if you are female. ? Blockage in your urethra. ? A kidney stone. ? A nerve condition that affects your bladder control (neurogenic bladder). ? Not getting enough to drink, or not urinating often.  You have certain medical conditions, such as: ? Diabetes. ? A weak disease-fighting system (immunesystem). ? Sickle cell disease. ? Gout. ? Spinal cord injury. What are the signs or symptoms? Symptoms of this condition include:  Needing to urinate right away (urgency).  Frequent urination. This may include small amounts of urine each time you urinate.  Pain or burning with urination.  Blood in the urine.  Urine that smells bad or unusual.  Trouble urinating.  Cloudy urine.  Vaginal discharge, if you are female.  Pain in the abdomen or the lower back. You may also have:  Vomiting or a decreased  appetite.  Confusion.  Irritability or tiredness.  A fever or chills.  Diarrhea. The first symptom in older adults may be confusion. In some cases, they may not have any symptoms until the infection has worsened. How is this diagnosed? This condition is diagnosed based on your medical history and a physical exam. You may also have other tests, including:  Urine tests.  Blood tests.  Tests for STIs (sexually transmitted infections). If you have had more than one UTI, a cystoscopy or imaging studies may be done to determine the cause of the infections. How is this treated? Treatment for this condition includes:  Antibiotic medicine.  Over-the-counter medicines to treat discomfort.  Drinking enough water to stay hydrated. If you have frequent infections or have other conditions such as a kidney stone, you may need to see a health care provider who specializes in the urinary tract (urologist). In rare cases, urinary tract infections can cause sepsis. Sepsis is a life-threatening condition that occurs when the body responds to an infection. Sepsis is treated in the hospital with IV antibiotics, fluids, and other medicines. Follow these instructions at home: Medicines  Take over-the-counter and prescription medicines only as told by your health care provider.  If you were prescribed an antibiotic medicine, take it as told by your health care provider. Do not stop using the antibiotic even if you start to feel better. General instructions  Make sure you: ? Empty your bladder often and completely. Do not hold urine for long periods of time. ? Empty your bladder after   sex. ? Wipe from front to back after urinating or having a bowel movement if you are female. Use each tissue only one time when you wipe.  Drink enough fluid to keep your urine pale yellow.  Keep all follow-up visits. This is important.   Contact a health care provider if:  Your symptoms do not get better after 1-2  days.  Your symptoms go away and then return. Get help right away if:  You have severe pain in your back or your lower abdomen.  You have a fever or chills.  You have nausea or vomiting. Summary  A urinary tract infection (UTI) is an infection of any part of the urinary tract, which includes the kidneys, ureters, bladder, and urethra.  Most urinary tract infections are caused by bacteria in your genital area.  Treatment for this condition often includes antibiotic medicines.  If you were prescribed an antibiotic medicine, take it as told by your health care provider. Do not stop using the antibiotic even if you start to feel better.  Keep all follow-up visits. This is important. This information is not intended to replace advice given to you by your health care provider. Make sure you discuss any questions you have with your health care provider. Document Revised: 08/11/2019 Document Reviewed: 08/11/2019 Elsevier Patient Education  2021 [employer]  

## 2020-01-23 NOTE — Progress Notes (Signed)
Bladder Scan Patient cannot void: 72 ml Performed By: Caiya Bettes,lpn   Urological Symptom Review  Patient is experiencing the following symptoms: Get up at night to urinate Trouble starting stream   Review of Systems  Gastrointestinal (upper)  : Negative for upper GI symptoms  Gastrointestinal (lower) : Negative for lower GI symptoms  Constitutional : Negative for symptoms  Skin: Negative for skin symptoms  Eyes: Negative for eye symptoms  Ear/Nose/Throat : Negative for Ear/Nose/Throat symptoms  Hematologic/Lymphatic: Negative for Hematologic/Lymphatic symptoms  Cardiovascular : Negative for cardiovascular symptoms  Respiratory : Negative for respiratory symptoms  Endocrine: Negative for endocrine symptoms  Musculoskeletal: Negative for musculoskeletal symptoms  Neurological: Negative for neurological symptoms  Psychologic: Negative for psychiatric symptoms

## 2020-01-24 ENCOUNTER — Other Ambulatory Visit (HOSPITAL_COMMUNITY): Payer: Self-pay | Admitting: Psychiatry

## 2020-01-26 IMAGING — CT CT ABD-PELV W/ CM
2 of 5 series · 16 of 46 positions shown, 18 images · IV contrast (Omnipaque or Isovue)
Comparison: 10/03/2018

CLINICAL DATA: Left lower quadrant pain for 6 months. Suspected
diverticulitis.

EXAM:
CT ABDOMEN AND PELVIS WITH CONTRAST
TECHNIQUE: Multidetector CT imaging of the abdomen and pelvis was performed
using the standard protocol following bolus administration of
intravenous contrast.
CONTRAST:  100mL OMNIPAQUE IOHEXOL 300 MG/ML  SOLN

[Series 2: axial st · axial · 0.86mm/px · z∈[+920,+1360]mm · 13 of 102 slices shown, 15 images]
[im 7/102  soft-tissue]
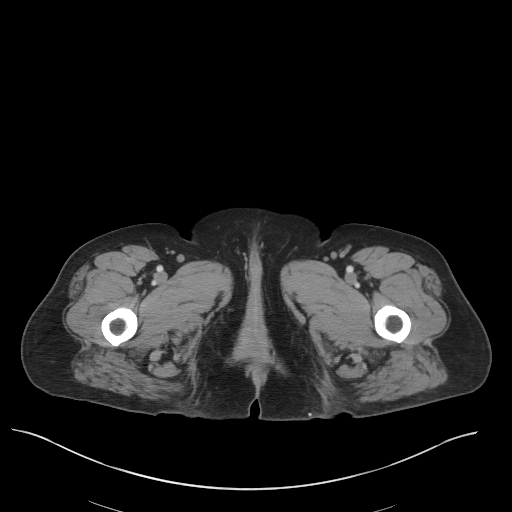
[im 7/102  bone]
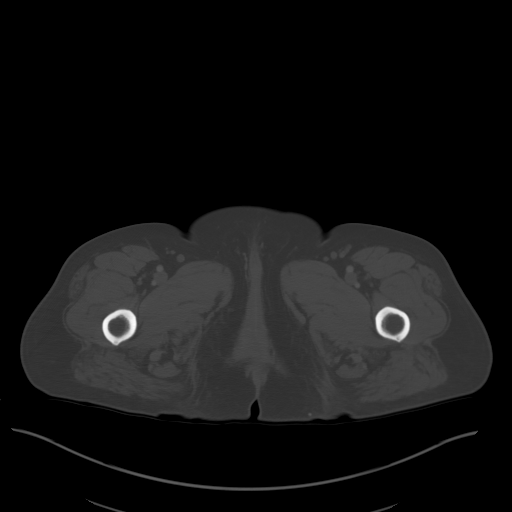
[im 14/102  soft-tissue]
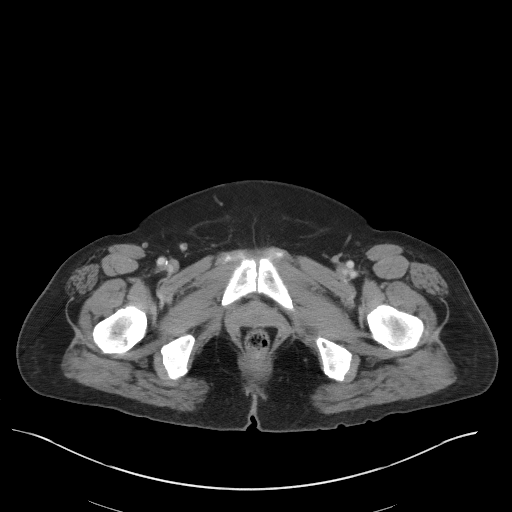
[im 21/102  soft-tissue]
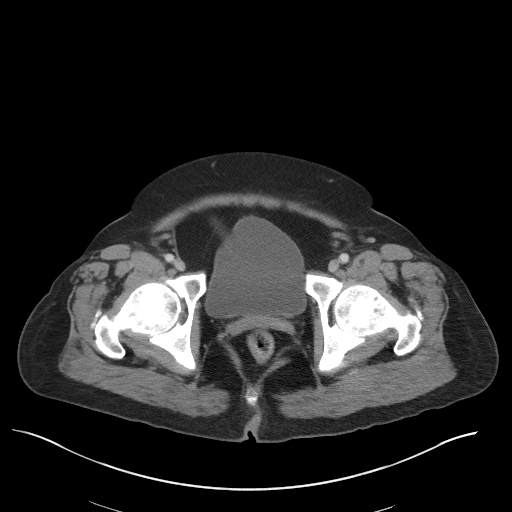
[im 27/102  soft-tissue]
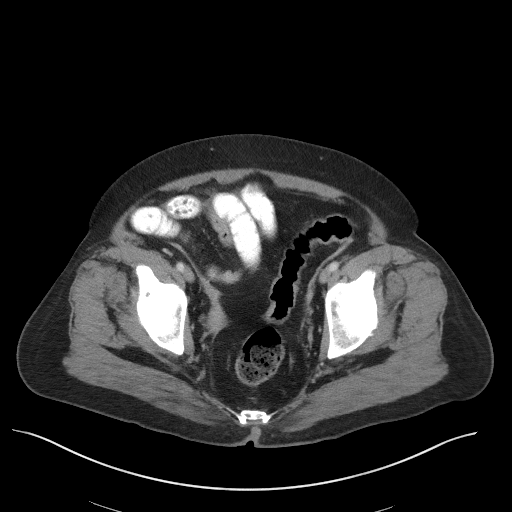
[im 34/102  soft-tissue]
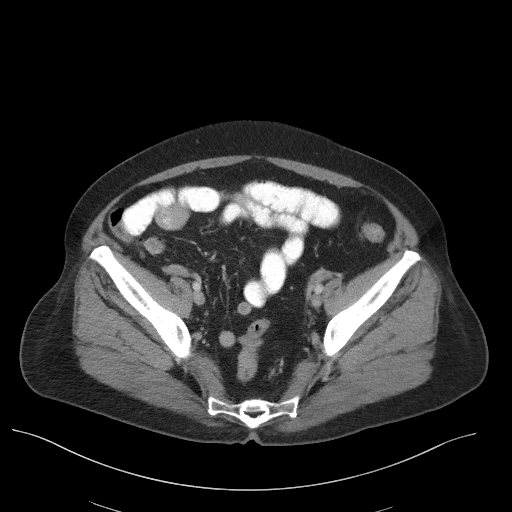
[im 41/102  soft-tissue]
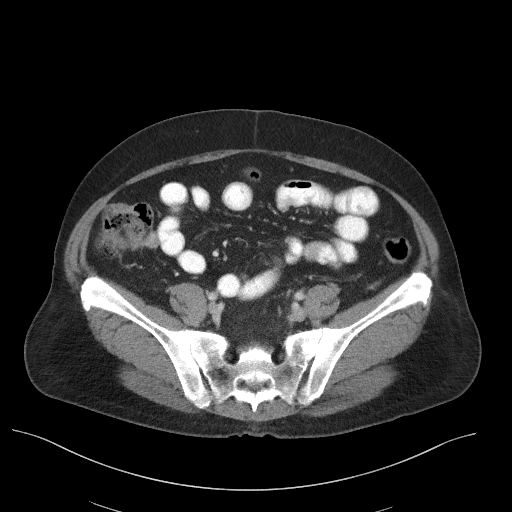
[im 54/102  soft-tissue]
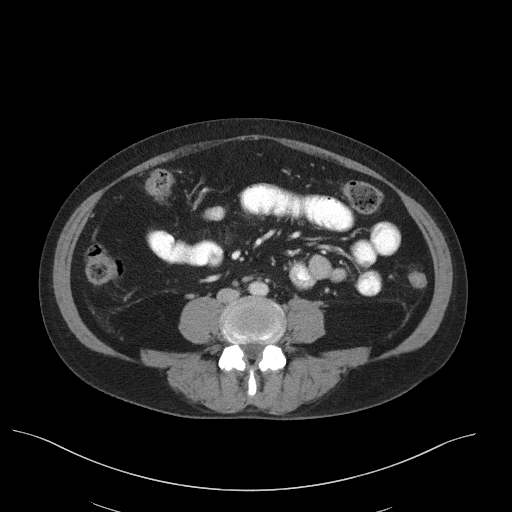
[im 61/102  soft-tissue]
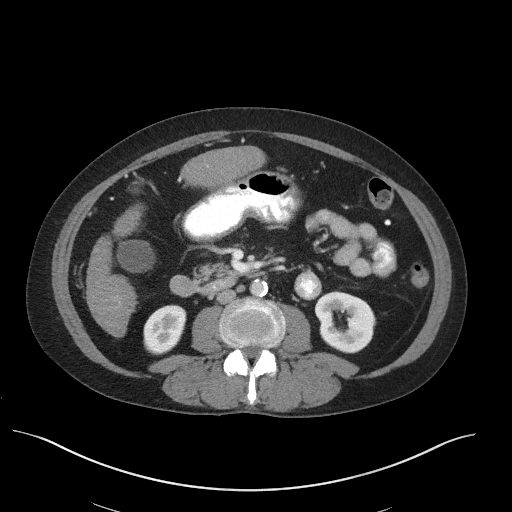
[im 68/102  soft-tissue]
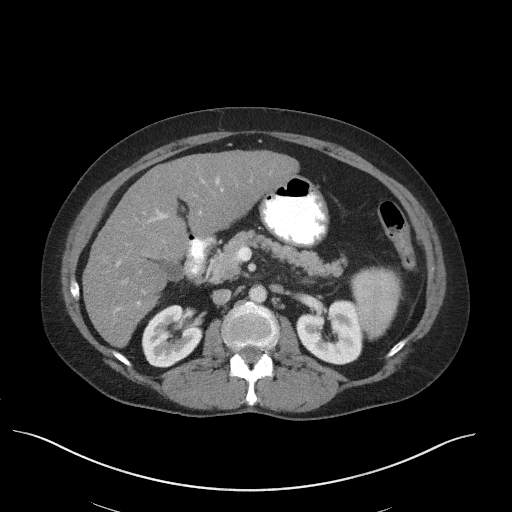
[im 68/102  bone]
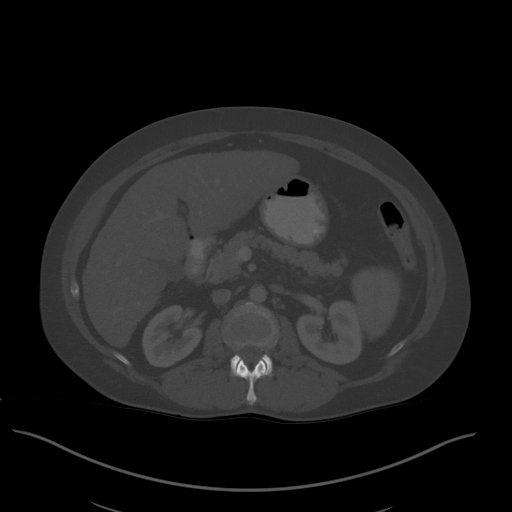
[im 75/102  soft-tissue]
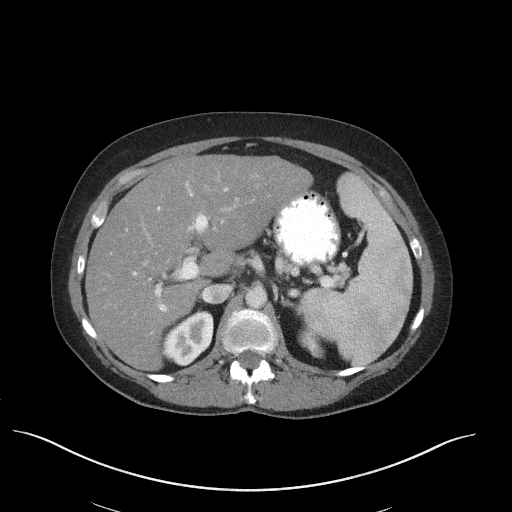
[im 81/102  soft-tissue]
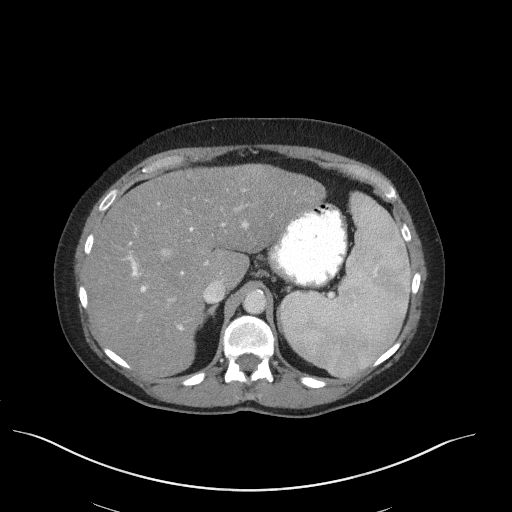
[im 88/102  soft-tissue]
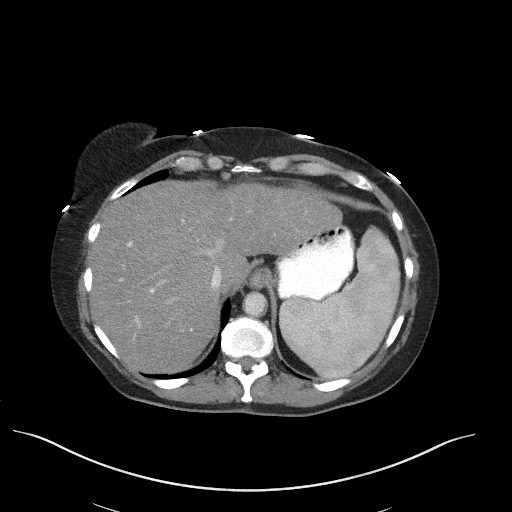
[im 95/102  soft-tissue]
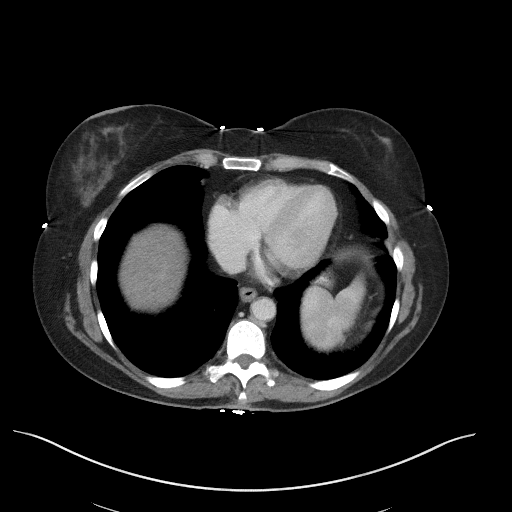

[Series 6: coronal st · coronal · 0.88mm/px · 3 of 117 slices shown]
[im 39/117  soft-tissue]
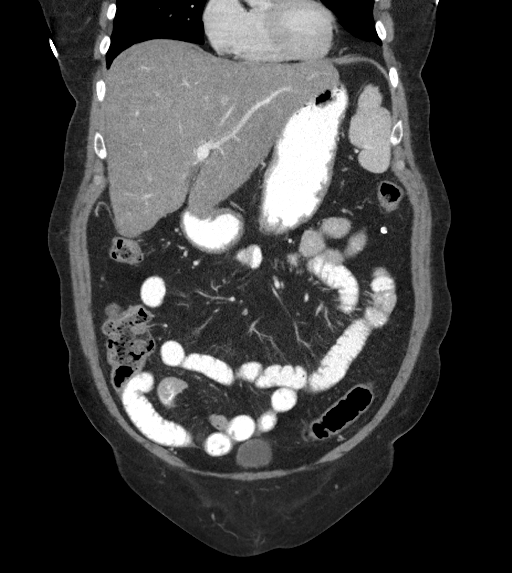
[im 52/117  soft-tissue]
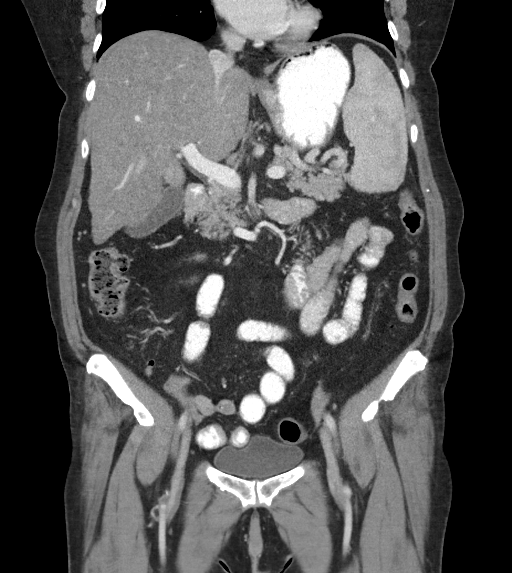
[im 65/117  soft-tissue]
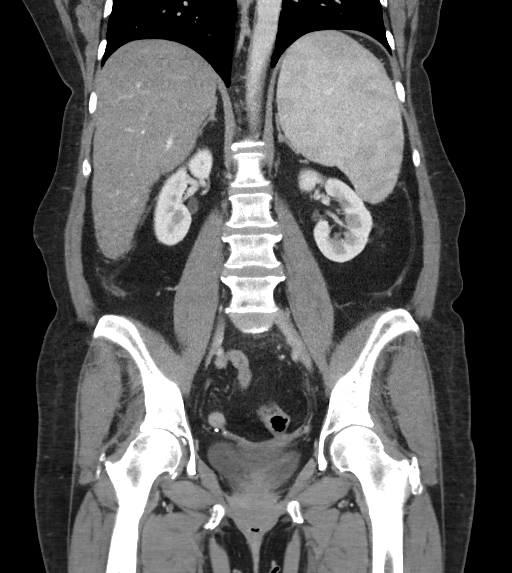

[16 of 46 positions shown; findings below may reference images not displayed]

FINDINGS: Lower Chest: No acute findings.

Hepatobiliary: No hepatic masses identified. Hepatic steatosis and
hepatic cirrhosis again demonstrated. Recanalization of
paraumbilical veins is consistent with portal venous hypertension.
Portal and hepatic veins are patent. Gallbladder is unremarkable. No
evidence of biliary ductal dilatation.

Pancreas:  No mass or inflammatory changes.

Spleen: Stable moderate splenomegaly, consistent with portal venous
hypertension.

Adrenals/Urinary Tract: No masses identified. No evidence of
hydronephrosis.

Stomach/Bowel: No evidence of obstruction, inflammatory process or
abnormal fluid collections. Normal appendix visualized.

Vascular/Lymphatic: No pathologically enlarged lymph nodes. No
abdominal aortic aneurysm. Aortic atherosclerosis incidentally
noted.

Reproductive: Prior hysterectomy noted. Adnexal regions are
unremarkable in appearance.

Other:  No evidence of ascites.

Musculoskeletal:  No suspicious bone lesions identified.
IMPRESSION: 1. No evidence of diverticulitis or other acute findings.
2. Hepatic steatosis, cirrhosis, and findings of portal venous
hypertension. No evidence of hepatic neoplasm.

## 2020-01-29 ENCOUNTER — Telehealth (HOSPITAL_COMMUNITY): Payer: Medicare Other | Admitting: Psychiatry

## 2020-01-29 ENCOUNTER — Other Ambulatory Visit: Payer: Self-pay

## 2020-01-30 ENCOUNTER — Telehealth (HOSPITAL_COMMUNITY): Payer: Self-pay | Admitting: Psychiatry

## 2020-01-30 NOTE — Telephone Encounter (Signed)
Called to schedule f/u appt left vm 

## 2020-02-01 ENCOUNTER — Other Ambulatory Visit: Payer: Self-pay

## 2020-02-01 ENCOUNTER — Encounter (HOSPITAL_COMMUNITY): Payer: Self-pay | Admitting: Psychiatry

## 2020-02-01 ENCOUNTER — Telehealth (INDEPENDENT_AMBULATORY_CARE_PROVIDER_SITE_OTHER): Payer: Medicare Other | Admitting: Psychiatry

## 2020-02-01 DIAGNOSIS — F3162 Bipolar disorder, current episode mixed, moderate: Secondary | ICD-10-CM | POA: Diagnosis not present

## 2020-02-01 MED ORDER — LITHIUM CARBONATE 300 MG PO TABS: ORAL_TABLET | ORAL | 2 refills | Status: DC

## 2020-02-01 MED ORDER — DIAZEPAM 2 MG PO TABS: 2.0000 mg | ORAL_TABLET | Freq: Two times a day (BID) | ORAL | 2 refills | Status: DC | PRN

## 2020-02-01 MED ORDER — DIAZEPAM 10 MG PO TABS: 10.0000 mg | ORAL_TABLET | Freq: Every day | ORAL | 2 refills | Status: DC

## 2020-02-01 MED ORDER — PROPRANOLOL HCL ER 60 MG PO CP24: 60.0000 mg | ORAL_CAPSULE | Freq: Every morning | ORAL | 2 refills | Status: DC

## 2020-02-01 MED ORDER — BUPROPION HCL ER (XL) 150 MG PO TB24: 150.0000 mg | ORAL_TABLET | Freq: Every morning | ORAL | 2 refills | Status: DC
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
## 2020-02-05 DIAGNOSIS — R059 Cough, unspecified: Secondary | ICD-10-CM | POA: Diagnosis not present

## 2020-02-10 DIAGNOSIS — K589 Irritable bowel syndrome without diarrhea: Secondary | ICD-10-CM | POA: Diagnosis not present

## 2020-02-10 DIAGNOSIS — E1165 Type 2 diabetes mellitus with hyperglycemia: Secondary | ICD-10-CM | POA: Diagnosis not present

## 2020-02-10 DIAGNOSIS — Z7984 Long term (current) use of oral hypoglycemic drugs: Secondary | ICD-10-CM | POA: Diagnosis not present

## 2020-02-10 DIAGNOSIS — E7849 Other hyperlipidemia: Secondary | ICD-10-CM | POA: Diagnosis not present

## 2020-02-12 DIAGNOSIS — K589 Irritable bowel syndrome without diarrhea: Secondary | ICD-10-CM | POA: Diagnosis not present

## 2020-02-12 DIAGNOSIS — E7849 Other hyperlipidemia: Secondary | ICD-10-CM | POA: Diagnosis not present

## 2020-02-12 DIAGNOSIS — E1165 Type 2 diabetes mellitus with hyperglycemia: Secondary | ICD-10-CM | POA: Diagnosis not present

## 2020-02-12 DIAGNOSIS — Z7984 Long term (current) use of oral hypoglycemic drugs: Secondary | ICD-10-CM | POA: Diagnosis not present

## 2020-02-26 ENCOUNTER — Encounter: Payer: Self-pay | Admitting: Diagnostic Neuroimaging

## 2020-02-26 ENCOUNTER — Ambulatory Visit (INDEPENDENT_AMBULATORY_CARE_PROVIDER_SITE_OTHER): Payer: Medicare Other | Admitting: Diagnostic Neuroimaging

## 2020-02-26 VITALS — BP 124/73 | HR 67 | Ht 66.0 in | Wt 160.1 lb

## 2020-02-26 DIAGNOSIS — R269 Unspecified abnormalities of gait and mobility: Secondary | ICD-10-CM | POA: Diagnosis not present

## 2020-02-26 NOTE — Patient Instructions (Signed)
  POSTURAL TREMOR (? medication associated tremor related to lithium and wellbutrin; vs enhanced physiologic / anxiety tremor; essential tremor a possibility; no evidence of parkinsonism) - continue bupropion, valium, propranolol  GAIT DIFFICULTY (related to knee pain, deconditioning, diabetic neuropathy) - refer to PT - use cane Andrea Dickson

## 2020-02-26 NOTE — Progress Notes (Signed)
[facility]  PATIENT: Andrea Dickson DOB: 1950-05-07  REFERRING CLINICIAN: S Burdine  HISTORY FROM: patient  REASON FOR VISIT: follow up   HISTORICAL  CHIEF COMPLAINT:  Chief Complaint  Patient presents with  . Tremors    Rm 6, sister- Andrea Dickson "tremors are worse, in my legs now, 3 falls recently, using a cane now"    HISTORY OF PRESENT ILLNESS:   UPDATE (11/23/17, VRP): Since last visit, doing about the same. Primidone not helping. Tremors are stable. Mood stabilization meds are working.   PRIOR HPI (05/14/17): 70 year old female with bipolar disorder, hypercholesteremia, here for evaluation of tremors. For past 5 years patient has had onset of tremors in her hands, mainly with action and posture.  Handwriting is affected her handwriting, eating, ability to hold things.  She has some head tremor in her head and neck.  No resting tremor. Patient also has fatigue, palpitations, memory loss, confusion, insomnia, weakness, dizziness, depression, anxiety, racing thoughts.   REVIEW OF SYSTEMS: Full 14 system review of systems performed and negative with exception of: As per HPI.  ALLERGIES: Allergies  Allergen Reactions  . Pramipexole Other (See Comments)    Shaking, palpitations, headache, faint feeling  . Crestor [Rosuvastatin Calcium]     Muscle pain  . Pollen Extract Other (See Comments)    Eyes and nose run    HOME MEDICATIONS: Outpatient Medications Prior to Visit  Medication Sig Dispense Refill  . acetaminophen (TYLENOL) 500 MG tablet Take 1,000 mg by mouth 2 (two) times daily as needed for moderate pain or headache.    Marland Kitchen buPROPion (WELLBUTRIN XL) 150 MG 24 hr tablet Take 1 tablet (150 mg total) by mouth every morning. 30 tablet 2  . cetirizine (ZYRTEC) 10 MG tablet Take 10 mg by mouth daily.    . Cholecalciferol (VITAMIN D3) 50 MCG (2000 UT) TABS Take 2,000 Units by mouth daily.     . diazepam (VALIUM) 10 MG tablet Take 1 tablet (10 mg total) by  mouth at bedtime. 60 tablet 2  . diazepam (VALIUM) 2 MG tablet Take 1 tablet (2 mg total) by mouth 2 (two) times daily as needed for anxiety. 60 tablet 2  . estrogens, conjugated, (PREMARIN) 1.25 MG tablet Take 1.25 mg by mouth daily.     Marland Kitchen ezetimibe (ZETIA) 10 MG tablet Take 10 mg by mouth daily.    Kathlene November TEST STRIPS test strip daily. use as directed    . lithium 300 MG tablet TAKE TWO TABLETS BY MOUTH EVERYDAY AT BEDTIME 60 tablet 2  . loperamide (IMODIUM) 2 MG capsule Take 2 mg by mouth as needed for diarrhea or loose stools.    . metFORMIN (GLUCOPHAGE) 500 MG tablet Take 500 mg by mouth in the morning and at bedtime.     . nitrofurantoin (MACRODANTIN) 50 MG capsule Take 1 capsule (50 mg total) by mouth at bedtime. 90 capsule 3  . omeprazole (PRILOSEC) 40 MG capsule Take 40 mg by mouth daily.    . ondansetron (ZOFRAN) 4 MG tablet TAKE 1 TABLET BY MOUTH EVERY 8 HOURS AS NEEDED FOR NAUSEA AND VOMITING (Patient taking differently: Take 4 mg by mouth every 8 (eight) hours as needed for nausea or vomiting.) 20 tablet 0  . propranolol ER (INDERAL LA) 60 MG 24 hr capsule Take 1 capsule (60 mg total) by mouth every morning. 30 capsule 2  . methocarbamol (ROBAXIN) 500 MG tablet Take 500 mg by mouth daily as needed for muscle spasms. (  Patient not taking: Reported on 02/26/2020)     No facility-administered medications prior to visit.    PAST MEDICAL HISTORY: Past Medical History:  Diagnosis Date  . Anxiety   . Asthma   . Bipolar 1 disorder ([place])   . Chronic diarrhea   . Cirrhosis [facility])    Diagnosed September 2020, likely secondary to [facility], s/p Hep A/B vaccination in 2021.   Marland Kitchen Depression   . Diabetes ([place])   . Essential tremor   . GERD (gastroesophageal reflux disease)   . Headache   . High cholesterol     PAST SURGICAL HISTORY: Past Surgical History:  Procedure Laterality Date  . ABDOMINAL HYSTERECTOMY  1997  . BIOPSY  01/17/2018   Procedure: BIOPSY;  Surgeon: Daneil Dolin, MD;   [facility];  Service: Endoscopy;;  colon  . carpal tunnel Bilateral 1999, 06/2009  . COLONOSCOPY  2013   Dr. Britta Mccreedy: no colon polyps. quality of prep was fair. repeat colonoscopy in five years.   . COLONOSCOPY WITH PROPOFOL N/A 01/17/2018   Dr. Gala Romney: Colonic lipoma, 2 tubular adenomas removed.  Random colon biopsies negative.  Next colonoscopy 5 years.  . ESOPHAGOGASTRODUODENOSCOPY (EGD) WITH PROPOFOL N/A 04/06/2019   Procedure: ESOPHAGOGASTRODUODENOSCOPY (EGD) WITH PROPOFOL;  Surgeon: Daneil Dolin, MD;   Normal esophagus, mild portal hypertensive gastropathy, otherwise normal exam.  Recommend repeat EGD in 2 years for variceal screening.   Marland Kitchen POLYPECTOMY  01/17/2018   Procedure: POLYPECTOMY;  Surgeon: Daneil Dolin, MD;  [facility];  Service: Endoscopy;;  colon  . SKIN CANCER EXCISION  2007  . TONSILLECTOMY  1965  . ulner nerve  Left 06/2009   decompression    FAMILY HISTORY: Family History  Problem Relation Age of Onset  . Bipolar disorder Brother   . Diabetes Brother   . Heart disease Brother   . Diabetes Father   . Heart attack Father   . Heart disease Father   . Diabetes Sister   . Diabetes Mother   . Brain cancer Mother   . Colon cancer Neg Hx     SOCIAL HISTORY:  Social History   Socioeconomic History  . Marital status: Divorced    Spouse name: Not on file  . Number of children: 1  . Years of education: 74  . Highest education level: Not on file  Occupational History  . Occupation: retired    Comment: employed at [employer]  . Smoking status: Current Every Day Smoker    Packs/day: 0.50    Years: 43.00    Pack years: 21.50    Types: Cigarettes  . Smokeless tobacco: Never Used  . Tobacco comment: smoking since 70yrs old.    Vaping Use  . Vaping Use: Never used  Substance and Sexual Activity  . Alcohol use: No    Alcohol/week: 0.0 standard drinks  . Drug use: No  . Sexual activity: Not Currently  Other Topics  Concern  . Not on file  Social History Narrative   Lives alone.  Retired.  Education 12th grade.  One child.     Caffeine use- sodas, 2 daily   Social Determinants of Health   Financial Resource Strain: Not on file  Food Insecurity: Not on file  Transportation Needs: Not on file  Physical Activity: Not on file  Stress: Not on file  Social Connections: Not on file  Intimate Partner Violence: Not on file     PHYSICAL EXAM  GENERAL  EXAM/CONSTITUTIONAL: Vitals:  Vitals:   02/26/20 1352  BP: 124/73  Pulse: 67  Weight: 160 lb 1 oz (72.6 kg)  Height: 5\' 6"  (1.676 m)   Body mass index is 25.83 kg/m. No exam data present  Patient is in no distress; well developed, nourished and groomed; neck is supple  CARDIOVASCULAR:  Examination of carotid arteries is normal; no carotid bruits  Regular rate and rhythm, no murmurs  Examination of peripheral vascular system by observation and palpation is normal  EYES:  Ophthalmoscopic exam of optic discs and posterior segments is normal; no papilledema or hemorrhages  MUSCULOSKELETAL:  Gait, strength, tone, movements noted in Neurologic exam below  NEUROLOGIC: MENTAL STATUS:  No flowsheet data found.  awake, alert, oriented to person, place and time  recent and remote memory intact  normal attention and concentration  language fluent, comprehension intact, naming intact,   fund of knowledge appropriate  CRANIAL NERVE:   2nd - no papilledema on fundoscopic exam  2nd, 3rd, 4th, 6th - pupils equal and reactive to light, visual fields full to confrontation, extraocular muscles intact, no nystagmus  5th - facial sensation symmetric  7th - facial strength symmetric  8th - hearing intact  9th - palate elevates symmetrically, uvula midline  11th - shoulder shrug symmetric  12th - tongue protrusion midline  MOTOR:   MILD POSTURAL TREMOR IN BUE  NO BRADYKINESIA; NO RIGIDITY  normal bulk; BUE AND BLE STRENGTH  LIMITED BY PAIN  SENSORY:   normal and symmetric to light touch  COORDINATION:   finger-nose-finger, fine finger movements normal  REFLEXES:   deep tendon reflexes TRACE and symmetric  GAIT/STATION:   SLOW TO STAND; UNSTEADY; USING CANE; SHORT STEPS    DIAGNOSTIC DATA (LABS, IMAGING, TESTING) - I reviewed patient records, labs, notes, testing and imaging myself where available.  Lab Results  Component Value Date   WBC 5.2 08/29/2019   HGB 13.0 08/29/2019   HCT 37.7 08/29/2019   MCV 90.2 08/29/2019   PLT 99 (L) 08/29/2019      Component Value Date/Time   NA 138 08/29/2019 1107   K 4.4 08/29/2019 1107   CL 106 08/29/2019 1107   CO2 25 08/29/2019 1107   GLUCOSE 118 (H) 08/29/2019 1107   BUN 9 08/29/2019 1107   CREATININE 0.81 08/29/2019 1107   CALCIUM 9.1 08/29/2019 1107   PROT 6.5 08/29/2019 1107   ALBUMIN 4.1 04/04/2019 1139   AST 14 08/29/2019 1107   ALT 8 08/29/2019 1107   ALKPHOS 124 04/04/2019 1139   BILITOT 0.7 08/29/2019 1107   GFRNONAA 75 08/29/2019 1107   GFRAA 86 08/29/2019 1107   Lab Results  Component Value Date   CHOL 178 02/17/2016   HDL 30 (L) 02/17/2016   LDLCALC 102 (H) 02/17/2016   TRIG 229 (H) 02/17/2016   CHOLHDL 5.9 (H) 02/17/2016   Lab Results  Component Value Date   HGBA1C 5.4 04/24/2015   No results found for: [id number] Lab Results  Component Value Date   TSH 2.96 08/29/2019    10/01/14 MRI brain - tiny non-specific T2 hyperintensities    ASSESSMENT AND PLAN  70 y.o. year old female here with action and postural tremor since 2014. Empiric trial of essential tremor meds not helpful. Therefore, may represent medication associated tremor or enhanced physiologic tremor.  Patient has tried and failed propranolol and primidone.    Dx:  1. Gait difficulty      PLAN:  POSTURAL TREMOR (? medication associated tremor related  to lithium and wellbutrin; vs enhanced physiologic / anxiety tremor; essential tremor a  possibility; no evidence of parkinsonism) - continue bupropion, valium, propranolol  GAIT DIFFICULTY (related to knee pain, deconditioning, diabetic neuropathy) - refer to PT - use cane Gilford Rile  Return for return to PCP, pending if symptoms worsen or fail to improve.    Penni Bombard, MD 0/23/3435, 6:86 PM Certified in Neurology, Neurophysiology and Neuroimaging  [facility] [address] [place], [place] [address] [phone]

## 2020-03-05 ENCOUNTER — Telehealth (INDEPENDENT_AMBULATORY_CARE_PROVIDER_SITE_OTHER): Payer: Medicare Other | Admitting: Psychiatry

## 2020-03-05 ENCOUNTER — Other Ambulatory Visit: Payer: Self-pay

## 2020-03-05 ENCOUNTER — Encounter (HOSPITAL_COMMUNITY): Payer: Self-pay | Admitting: Psychiatry

## 2020-03-05 DIAGNOSIS — F3162 Bipolar disorder, current episode mixed, moderate: Secondary | ICD-10-CM | POA: Diagnosis not present
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
## 2020-03-07 DIAGNOSIS — K589 Irritable bowel syndrome without diarrhea: Secondary | ICD-10-CM | POA: Diagnosis not present

## 2020-03-07 DIAGNOSIS — R296 Repeated falls: Secondary | ICD-10-CM | POA: Diagnosis not present

## 2020-03-07 DIAGNOSIS — E1169 Type 2 diabetes mellitus with other specified complication: Secondary | ICD-10-CM | POA: Diagnosis not present

## 2020-03-07 DIAGNOSIS — R197 Diarrhea, unspecified: Secondary | ICD-10-CM | POA: Diagnosis not present

## 2020-03-07 DIAGNOSIS — R269 Unspecified abnormalities of gait and mobility: Secondary | ICD-10-CM | POA: Diagnosis not present

## 2020-03-07 DIAGNOSIS — G252 Other specified forms of tremor: Secondary | ICD-10-CM | POA: Diagnosis not present

## 2020-03-11 DIAGNOSIS — K589 Irritable bowel syndrome without diarrhea: Secondary | ICD-10-CM | POA: Diagnosis not present

## 2020-03-11 DIAGNOSIS — E1165 Type 2 diabetes mellitus with hyperglycemia: Secondary | ICD-10-CM | POA: Diagnosis not present

## 2020-03-11 DIAGNOSIS — E7849 Other hyperlipidemia: Secondary | ICD-10-CM | POA: Diagnosis not present

## 2020-03-11 DIAGNOSIS — Z7984 Long term (current) use of oral hypoglycemic drugs: Secondary | ICD-10-CM | POA: Diagnosis not present

## 2020-03-19 ENCOUNTER — Other Ambulatory Visit: Payer: Self-pay

## 2020-03-19 ENCOUNTER — Encounter (HOSPITAL_COMMUNITY): Payer: Self-pay | Admitting: Psychiatry

## 2020-03-19 ENCOUNTER — Telehealth (INDEPENDENT_AMBULATORY_CARE_PROVIDER_SITE_OTHER): Payer: Medicare Other | Admitting: Psychiatry

## 2020-03-19 DIAGNOSIS — F3162 Bipolar disorder, current episode mixed, moderate: Secondary | ICD-10-CM | POA: Diagnosis not present

## 2020-03-19 MED ORDER — DIAZEPAM 2 MG PO TABS: 2.0000 mg | ORAL_TABLET | Freq: Two times a day (BID) | ORAL | 2 refills | Status: DC | PRN

## 2020-03-19 MED ORDER — PROPRANOLOL HCL ER 60 MG PO CP24: 60.0000 mg | ORAL_CAPSULE | Freq: Every morning | ORAL | 2 refills | Status: DC

## 2020-03-19 MED ORDER — BUPROPION HCL ER (XL) 150 MG PO TB24: 150.0000 mg | ORAL_TABLET | Freq: Every morning | ORAL | 2 refills | Status: DC

## 2020-03-19 MED ORDER — DIAZEPAM 10 MG PO TABS: 10.0000 mg | ORAL_TABLET | Freq: Every day | ORAL | 2 refills | Status: DC
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
## 2020-03-20 DIAGNOSIS — K589 Irritable bowel syndrome without diarrhea: Secondary | ICD-10-CM | POA: Diagnosis not present

## 2020-03-20 DIAGNOSIS — G252 Other specified forms of tremor: Secondary | ICD-10-CM | POA: Diagnosis not present

## 2020-03-20 DIAGNOSIS — R197 Diarrhea, unspecified: Secondary | ICD-10-CM | POA: Diagnosis not present

## 2020-03-20 DIAGNOSIS — R296 Repeated falls: Secondary | ICD-10-CM | POA: Diagnosis not present

## 2020-03-20 DIAGNOSIS — E1169 Type 2 diabetes mellitus with other specified complication: Secondary | ICD-10-CM | POA: Diagnosis not present

## 2020-03-20 DIAGNOSIS — R269 Unspecified abnormalities of gait and mobility: Secondary | ICD-10-CM | POA: Diagnosis not present

## 2020-04-10 DIAGNOSIS — Z7984 Long term (current) use of oral hypoglycemic drugs: Secondary | ICD-10-CM | POA: Diagnosis not present

## 2020-04-10 DIAGNOSIS — E1165 Type 2 diabetes mellitus with hyperglycemia: Secondary | ICD-10-CM | POA: Diagnosis not present

## 2020-04-10 DIAGNOSIS — E7849 Other hyperlipidemia: Secondary | ICD-10-CM | POA: Diagnosis not present

## 2020-04-10 DIAGNOSIS — K589 Irritable bowel syndrome without diarrhea: Secondary | ICD-10-CM | POA: Diagnosis not present

## 2020-04-15 DIAGNOSIS — E782 Mixed hyperlipidemia: Secondary | ICD-10-CM | POA: Diagnosis not present

## 2020-04-15 DIAGNOSIS — K219 Gastro-esophageal reflux disease without esophagitis: Secondary | ICD-10-CM | POA: Diagnosis not present

## 2020-04-15 DIAGNOSIS — R739 Hyperglycemia, unspecified: Secondary | ICD-10-CM | POA: Diagnosis not present

## 2020-04-15 DIAGNOSIS — E1169 Type 2 diabetes mellitus with other specified complication: Secondary | ICD-10-CM | POA: Diagnosis not present

## 2020-04-15 DIAGNOSIS — K7460 Unspecified cirrhosis of liver: Secondary | ICD-10-CM | POA: Diagnosis not present

## 2020-04-15 DIAGNOSIS — E7849 Other hyperlipidemia: Secondary | ICD-10-CM | POA: Diagnosis not present

## 2020-04-18 DIAGNOSIS — E1169 Type 2 diabetes mellitus with other specified complication: Secondary | ICD-10-CM | POA: Diagnosis not present

## 2020-04-18 DIAGNOSIS — Z1389 Encounter for screening for other disorder: Secondary | ICD-10-CM | POA: Diagnosis not present

## 2020-04-18 DIAGNOSIS — R269 Unspecified abnormalities of gait and mobility: Secondary | ICD-10-CM | POA: Diagnosis not present

## 2020-04-18 DIAGNOSIS — G252 Other specified forms of tremor: Secondary | ICD-10-CM | POA: Diagnosis not present

## 2020-04-18 DIAGNOSIS — R296 Repeated falls: Secondary | ICD-10-CM | POA: Diagnosis not present

## 2020-04-23 ENCOUNTER — Other Ambulatory Visit: Payer: Self-pay

## 2020-04-23 ENCOUNTER — Encounter (HOSPITAL_COMMUNITY): Payer: Self-pay | Admitting: Psychiatry

## 2020-04-23 ENCOUNTER — Telehealth (INDEPENDENT_AMBULATORY_CARE_PROVIDER_SITE_OTHER): Payer: Medicare Other | Admitting: Psychiatry

## 2020-04-23 DIAGNOSIS — F3162 Bipolar disorder, current episode mixed, moderate: Secondary | ICD-10-CM | POA: Diagnosis not present

## 2020-04-23 MED ORDER — DIAZEPAM 2 MG PO TABS: 2.0000 mg | ORAL_TABLET | Freq: Two times a day (BID) | ORAL | 2 refills | Status: DC | PRN

## 2020-04-23 MED ORDER — PROPRANOLOL HCL ER 60 MG PO CP24: 60.0000 mg | ORAL_CAPSULE | Freq: Every morning | ORAL | 2 refills | Status: DC

## 2020-04-23 MED ORDER — DIAZEPAM 10 MG PO TABS: 10.0000 mg | ORAL_TABLET | Freq: Every day | ORAL | 2 refills | Status: DC

## 2020-04-23 MED ORDER — BUPROPION HCL ER (XL) 150 MG PO TB24: 150.0000 mg | ORAL_TABLET | Freq: Every morning | ORAL | 2 refills | Status: DC
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
## 2020-05-03 DIAGNOSIS — N390 Urinary tract infection, site not specified: Secondary | ICD-10-CM | POA: Diagnosis not present

## 2020-05-03 DIAGNOSIS — R059 Cough, unspecified: Secondary | ICD-10-CM | POA: Diagnosis not present

## 2020-05-03 DIAGNOSIS — J209 Acute bronchitis, unspecified: Secondary | ICD-10-CM | POA: Diagnosis not present

## 2020-05-04 DIAGNOSIS — R300 Dysuria: Secondary | ICD-10-CM | POA: Diagnosis not present

## 2020-05-11 DIAGNOSIS — E7849 Other hyperlipidemia: Secondary | ICD-10-CM | POA: Diagnosis not present

## 2020-05-11 DIAGNOSIS — E1165 Type 2 diabetes mellitus with hyperglycemia: Secondary | ICD-10-CM | POA: Diagnosis not present

## 2020-05-11 DIAGNOSIS — K589 Irritable bowel syndrome without diarrhea: Secondary | ICD-10-CM | POA: Diagnosis not present

## 2020-05-11 DIAGNOSIS — Z7984 Long term (current) use of oral hypoglycemic drugs: Secondary | ICD-10-CM | POA: Diagnosis not present

## 2020-05-20 ENCOUNTER — Telehealth (HOSPITAL_COMMUNITY): Payer: Medicare Other | Admitting: Psychiatry

## 2020-05-29 DIAGNOSIS — E119 Type 2 diabetes mellitus without complications: Secondary | ICD-10-CM | POA: Diagnosis not present

## 2020-05-29 DIAGNOSIS — H52222 Regular astigmatism, left eye: Secondary | ICD-10-CM | POA: Diagnosis not present

## 2020-05-29 DIAGNOSIS — H524 Presbyopia: Secondary | ICD-10-CM | POA: Diagnosis not present

## 2020-05-29 DIAGNOSIS — Z7984 Long term (current) use of oral hypoglycemic drugs: Secondary | ICD-10-CM | POA: Diagnosis not present

## 2020-05-29 DIAGNOSIS — H2513 Age-related nuclear cataract, bilateral: Secondary | ICD-10-CM | POA: Diagnosis not present

## 2020-05-29 DIAGNOSIS — H5203 Hypermetropia, bilateral: Secondary | ICD-10-CM | POA: Diagnosis not present

## 2020-06-10 DIAGNOSIS — E782 Mixed hyperlipidemia: Secondary | ICD-10-CM | POA: Diagnosis not present

## 2020-06-10 DIAGNOSIS — E1169 Type 2 diabetes mellitus with other specified complication: Secondary | ICD-10-CM | POA: Diagnosis not present

## 2020-06-10 DIAGNOSIS — K219 Gastro-esophageal reflux disease without esophagitis: Secondary | ICD-10-CM | POA: Diagnosis not present

## 2020-06-10 DIAGNOSIS — E7800 Pure hypercholesterolemia, unspecified: Secondary | ICD-10-CM | POA: Diagnosis not present

## 2020-06-24 ENCOUNTER — Other Ambulatory Visit: Payer: Self-pay

## 2020-06-24 ENCOUNTER — Encounter (HOSPITAL_COMMUNITY): Payer: Self-pay | Admitting: Psychiatry

## 2020-06-24 ENCOUNTER — Telehealth (INDEPENDENT_AMBULATORY_CARE_PROVIDER_SITE_OTHER): Payer: Medicare Other | Admitting: Psychiatry

## 2020-06-24 DIAGNOSIS — F3162 Bipolar disorder, current episode mixed, moderate: Secondary | ICD-10-CM | POA: Diagnosis not present

## 2020-06-24 MED ORDER — PROPRANOLOL HCL ER 60 MG PO CP24: 60.0000 mg | ORAL_CAPSULE | Freq: Every morning | ORAL | 2 refills | Status: DC

## 2020-06-24 MED ORDER — BUPROPION HCL ER (XL) 150 MG PO TB24: 150.0000 mg | ORAL_TABLET | Freq: Every morning | ORAL | 2 refills | Status: DC

## 2020-06-24 MED ORDER — DIAZEPAM 10 MG PO TABS: 10.0000 mg | ORAL_TABLET | Freq: Every day | ORAL | 2 refills | Status: DC

## 2020-06-24 MED ORDER — DIAZEPAM 2 MG PO TABS: 2.0000 mg | ORAL_TABLET | Freq: Two times a day (BID) | ORAL | 2 refills | Status: DC | PRN
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
## 2020-07-09 DIAGNOSIS — A09 Infectious gastroenteritis and colitis, unspecified: Secondary | ICD-10-CM | POA: Diagnosis not present

## 2020-07-11 DIAGNOSIS — E782 Mixed hyperlipidemia: Secondary | ICD-10-CM | POA: Diagnosis not present

## 2020-07-11 DIAGNOSIS — E1169 Type 2 diabetes mellitus with other specified complication: Secondary | ICD-10-CM | POA: Diagnosis not present

## 2020-07-11 DIAGNOSIS — E7800 Pure hypercholesterolemia, unspecified: Secondary | ICD-10-CM | POA: Diagnosis not present

## 2020-07-11 DIAGNOSIS — K219 Gastro-esophageal reflux disease without esophagitis: Secondary | ICD-10-CM | POA: Diagnosis not present

## 2020-07-16 DIAGNOSIS — E1169 Type 2 diabetes mellitus with other specified complication: Secondary | ICD-10-CM | POA: Diagnosis not present

## 2020-07-16 DIAGNOSIS — E782 Mixed hyperlipidemia: Secondary | ICD-10-CM | POA: Diagnosis not present

## 2020-07-16 DIAGNOSIS — R7989 Other specified abnormal findings of blood chemistry: Secondary | ICD-10-CM | POA: Diagnosis not present

## 2020-07-16 DIAGNOSIS — K219 Gastro-esophageal reflux disease without esophagitis: Secondary | ICD-10-CM | POA: Diagnosis not present

## 2020-07-16 DIAGNOSIS — E559 Vitamin D deficiency, unspecified: Secondary | ICD-10-CM | POA: Diagnosis not present

## 2020-07-16 DIAGNOSIS — E7849 Other hyperlipidemia: Secondary | ICD-10-CM | POA: Diagnosis not present

## 2020-07-16 DIAGNOSIS — K7460 Unspecified cirrhosis of liver: Secondary | ICD-10-CM | POA: Diagnosis not present

## 2020-07-16 DIAGNOSIS — E7800 Pure hypercholesterolemia, unspecified: Secondary | ICD-10-CM | POA: Diagnosis not present

## 2020-07-23 ENCOUNTER — Ambulatory Visit: Payer: Medicare Other | Admitting: Urology

## 2020-07-26 DIAGNOSIS — Z7689 Persons encountering health services in other specified circumstances: Secondary | ICD-10-CM | POA: Diagnosis not present

## 2020-07-26 DIAGNOSIS — N1831 Chronic kidney disease, stage 3a: Secondary | ICD-10-CM | POA: Diagnosis not present

## 2020-07-26 DIAGNOSIS — F17210 Nicotine dependence, cigarettes, uncomplicated: Secondary | ICD-10-CM | POA: Diagnosis not present

## 2020-07-26 DIAGNOSIS — R269 Unspecified abnormalities of gait and mobility: Secondary | ICD-10-CM | POA: Diagnosis not present

## 2020-07-26 DIAGNOSIS — K589 Irritable bowel syndrome without diarrhea: Secondary | ICD-10-CM | POA: Diagnosis not present

## 2020-07-26 DIAGNOSIS — E1169 Type 2 diabetes mellitus with other specified complication: Secondary | ICD-10-CM | POA: Diagnosis not present

## 2020-08-11 DIAGNOSIS — E7800 Pure hypercholesterolemia, unspecified: Secondary | ICD-10-CM | POA: Diagnosis not present

## 2020-08-11 DIAGNOSIS — E782 Mixed hyperlipidemia: Secondary | ICD-10-CM | POA: Diagnosis not present

## 2020-08-11 DIAGNOSIS — K219 Gastro-esophageal reflux disease without esophagitis: Secondary | ICD-10-CM | POA: Diagnosis not present

## 2020-08-11 DIAGNOSIS — E1169 Type 2 diabetes mellitus with other specified complication: Secondary | ICD-10-CM | POA: Diagnosis not present

## 2020-09-09 DIAGNOSIS — N1831 Chronic kidney disease, stage 3a: Secondary | ICD-10-CM | POA: Diagnosis not present

## 2020-09-09 DIAGNOSIS — K589 Irritable bowel syndrome without diarrhea: Secondary | ICD-10-CM | POA: Diagnosis not present

## 2020-09-09 DIAGNOSIS — E1169 Type 2 diabetes mellitus with other specified complication: Secondary | ICD-10-CM | POA: Diagnosis not present

## 2020-09-11 DIAGNOSIS — E1169 Type 2 diabetes mellitus with other specified complication: Secondary | ICD-10-CM | POA: Diagnosis not present

## 2020-09-11 DIAGNOSIS — E7800 Pure hypercholesterolemia, unspecified: Secondary | ICD-10-CM | POA: Diagnosis not present

## 2020-09-11 DIAGNOSIS — E782 Mixed hyperlipidemia: Secondary | ICD-10-CM | POA: Diagnosis not present

## 2020-09-11 DIAGNOSIS — K219 Gastro-esophageal reflux disease without esophagitis: Secondary | ICD-10-CM | POA: Diagnosis not present

## 2020-09-18 ENCOUNTER — Other Ambulatory Visit (HOSPITAL_COMMUNITY): Payer: Self-pay | Admitting: Psychiatry

## 2020-09-24 ENCOUNTER — Telehealth (INDEPENDENT_AMBULATORY_CARE_PROVIDER_SITE_OTHER): Payer: Medicare Other | Admitting: Psychiatry

## 2020-09-24 ENCOUNTER — Other Ambulatory Visit: Payer: Self-pay

## 2020-09-24 ENCOUNTER — Encounter (HOSPITAL_COMMUNITY): Payer: Self-pay | Admitting: Psychiatry

## 2020-09-24 DIAGNOSIS — F3162 Bipolar disorder, current episode mixed, moderate: Secondary | ICD-10-CM | POA: Diagnosis not present

## 2020-09-24 MED ORDER — DIAZEPAM 2 MG PO TABS: ORAL_TABLET | ORAL | 2 refills | Status: DC

## 2020-09-24 MED ORDER — PROPRANOLOL HCL ER 60 MG PO CP24: 60.0000 mg | ORAL_CAPSULE | Freq: Every morning | ORAL | 2 refills | Status: DC

## 2020-09-24 MED ORDER — BUPROPION HCL ER (XL) 150 MG PO TB24: 150.0000 mg | ORAL_TABLET | Freq: Every morning | ORAL | 2 refills | Status: DC

## 2020-09-24 MED ORDER — DIAZEPAM 10 MG PO TABS: 10.0000 mg | ORAL_TABLET | Freq: Every day | ORAL | 2 refills | Status: DC

## 2020-09-24 NOTE — Progress Notes (Signed)
Virtual Visit via Telephone Note  I connected with Andrea Dickson on 09/24/20 at 10:00 AM EDT by telephone and verified that I am speaking with the correct person using two identifiers.  Location: Patient: home Provider: office   I discussed the limitations, risks, security and privacy concerns of performing an evaluation and management service by telephone and the availability of in person appointments. I also discussed with the patient that there may be a patient responsible charge related to this service. The patient expressed understanding and agreed to proceed.       I discussed the assessment and treatment plan with the patient. The patient was provided an opportunity to ask questions and all were answered. The patient agreed with the plan and demonstrated an understanding of the instructions.   The patient was advised to call back or seek an in-person evaluation if the symptoms worsen or if the condition fails to improve as anticipated.  I provided 12 minutes of non-face-to-face time during this encounter.   Levonne Spiller, MD  [facility] MD/PA/NP OP Progress Note  09/24/2020 10:13 AM Andrea D[facility]  MRN:  [id number]  Chief Complaint:  Chief Complaint   Manic Behavior; Anxiety; Follow-up    HPI: This patient is a 70 year old separated white female who lives alone in [place]. She has 1 son and 3 grandchildren. She worked as a Engineer, technical sales for [employer].   she was referred by her primary physician, Dr. Pleas Koch for further assessment and treatment of possible bipolar disorder.  The patient returns for follow-up after 3 months.  For the most part she is doing well.  She does note that this month is the 2-year anniversary of her father's death.  She is had more difficulty sleeping and has been having a lot of dreams about him at night.  I suggested that she use the 2 mg Valium along with the 10 mg at night to help with sleep and she is going to try this.  In general she  states her mood has been good.  She denies severe depression or anxiety.  She does not like to be out in large crowds but other than that she is doing everything she wants to do.  She is spending a lot of time at with her mother particularly since her father died.  Her energy is good.  She denies thoughts of self-harm or suicidal ideation and sounds very upbeat.  She still has tremor in her hands but nowhere near as bad as she has had in the past Visit Diagnosis:    ICD-10-CM   1. Bipolar 1 disorder, mixed, moderate (HCC)  F31.62       Past Psychiatric History: Past outpatient treatment  Past Medical History:  Past Medical History:  Diagnosis Date   Anxiety    Asthma    Bipolar 1 disorder ([place])    Chronic diarrhea    Cirrhosis ([place])    Diagnosed September 2020, likely secondary to [facility], s/p Hep A/B vaccination in 2021.    Depression    Diabetes ([place])    Essential tremor    GERD (gastroesophageal reflux disease)    Headache    High cholesterol     Past Surgical History:  Procedure Laterality Date   ABDOMINAL HYSTERECTOMY  1997   BIOPSY  01/17/2018   Procedure: BIOPSY;  Surgeon: Daneil Dolin, MD;  [facility];  Service: Endoscopy;;  colon   carpal tunnel Bilateral 1999, 06/2009   COLONOSCOPY  2013  Dr. Britta Mccreedy: no colon polyps. quality of prep was fair. repeat colonoscopy in five years.    COLONOSCOPY WITH PROPOFOL N/A 01/17/2018   Dr. Gala Romney: Colonic lipoma, 2 tubular adenomas removed.  Random colon biopsies negative.  Next colonoscopy 5 years.   ESOPHAGOGASTRODUODENOSCOPY (EGD) WITH PROPOFOL N/A 04/06/2019   Procedure: ESOPHAGOGASTRODUODENOSCOPY (EGD) WITH PROPOFOL;  Surgeon: Daneil Dolin, MD;   Normal esophagus, mild portal hypertensive gastropathy, otherwise normal exam.  Recommend repeat EGD in 2 years for variceal screening.    POLYPECTOMY  01/17/2018   Procedure: POLYPECTOMY;  Surgeon: Daneil Dolin, MD;  [facility];  Service: Endoscopy;;  colon    SKIN CANCER EXCISION  2007   [place]   ulner nerve  Left 06/2009   decompression    Family Psychiatric History: see below  Family History:  Family History  Problem Relation Age of Onset   Bipolar disorder Brother    Diabetes Brother    Heart disease Brother    Diabetes Father    Heart attack Father    Heart disease Father    Diabetes Sister    Diabetes Mother    Brain cancer Mother    Colon cancer Neg Hx     Social History:  Social History   Socioeconomic History   Marital status: Divorced    Spouse name: Not on file   Number of children: 1   Years of education: 12   Highest education level: Not on file  Occupational History   Occupation: retired    Comment: employed at [employer] Use   Smoking status: Every Day    Packs/day: 0.50    Years: 43.00    Pack years: 21.50    Types: Cigarettes   Smokeless tobacco: Never   Tobacco comments:    smoking since 71yr old.    Vaping Use   Vaping Use: Never used  Substance and Sexual Activity   Alcohol use: No    Alcohol/week: 0.0 standard drinks   Drug use: No   Sexual activity: Not Currently  Other Topics Concern   Not on file  Social History Narrative   Lives alone.  Retired.  Education 12th grade.  One child.     Caffeine use- sodas, 2 daily   Social Determinants of Health   Financial Resource Strain: Not on file  Food Insecurity: Not on file  Transportation Needs: Not on file  Physical Activity: Not on file  Stress: Not on file  Social Connections: Not on file    Allergies:  Allergies  Allergen Reactions   Pramipexole Other (See Comments)    Shaking, palpitations, headache, faint feeling   Crestor [Rosuvastatin Calcium]     Muscle pain   Pollen Extract Other (See Comments)    Eyes and nose run    Metabolic Disorder Labs: Lab Results  Component Value Date   HGBA1C 5.4 04/24/2015   MPG 108 04/24/2015   No results found for: PROLACTIN Lab Results  Component Value  Date   CHOL 178 02/17/2016   TRIG 229 (H) 02/17/2016   HDL 30 (L) 02/17/2016   CHOLHDL 5.9 (H) 02/17/2016   VLDL 46 (H) 02/17/2016   LDLCALC 102 (H) 02/17/2016   LDLCALC 126 (H) 11/18/2015   Lab Results  Component Value Date   TSH 2.96 08/29/2019   TSH 2.27 10/13/2018    Therapeutic Level Labs: Lab Results  Component Value Date   LITHIUM 0.8 08/29/2019   LITHIUM 0.4 (L) 10/13/2018  Lab Results  Component Value Date   VALPROATE 62.2 08/20/2016   VALPROATE 83.0 10/14/2015   No components found for:  CBMZ  Current Medications: Current Outpatient Medications  Medication Sig Dispense Refill   acetaminophen (TYLENOL) 500 MG tablet Take 1,000 mg by mouth 2 (two) times daily as needed for moderate pain or headache.     buPROPion (WELLBUTRIN XL) 150 MG 24 hr tablet Take 1 tablet (150 mg total) by mouth every morning. 30 tablet 2   cetirizine (ZYRTEC) 10 MG tablet Take 10 mg by mouth daily.     Cholecalciferol (VITAMIN D3) 50 MCG (2000 UT) TABS Take 2,000 Units by mouth daily.      diazepam (VALIUM) 10 MG tablet Take 1 tablet (10 mg total) by mouth at bedtime. 60 tablet 2   diazepam (VALIUM) 2 MG tablet TAKE ONE TABLET BY MOUTH TWO times daily AS NEEDED FOR ANXIETY 60 tablet 2   estrogens, conjugated, (PREMARIN) 1.25 MG tablet Take 1.25 mg by mouth daily.      ezetimibe (ZETIA) 10 MG tablet Take 10 mg by mouth daily.     glipiZIDE (GLUCOTROL) 10 MG tablet Take 10 mg by mouth daily.     IGLUCOSE TEST STRIPS test strip daily. use as directed     loperamide (IMODIUM) 2 MG capsule Take 2 mg by mouth as needed for diarrhea or loose stools.     methocarbamol (ROBAXIN) 500 MG tablet Take 500 mg by mouth daily as needed for muscle spasms. (Patient not taking: Reported on 02/26/2020)     nitrofurantoin (MACRODANTIN) 50 MG capsule Take 1 capsule (50 mg total) by mouth at bedtime. 90 capsule 3   omeprazole (PRILOSEC) 40 MG capsule Take 40 mg by mouth daily.     ondansetron (ZOFRAN) 4 MG  tablet TAKE 1 TABLET BY MOUTH EVERY 8 HOURS AS NEEDED FOR NAUSEA AND VOMITING (Patient taking differently: Take 4 mg by mouth every 8 (eight) hours as needed for nausea or vomiting.) 20 tablet 0   propranolol ER (INDERAL LA) 60 MG 24 hr capsule Take 1 capsule (60 mg total) by mouth every morning. 30 capsule 2   RYBELSUS 7 MG TABS Take 1 tablet by mouth daily.     No current facility-administered medications for this visit.     Musculoskeletal: Strength & Muscle Tone: na Gait & Station: na Patient leans: N/A  Psychiatric Specialty Exam: Review of Systems  Neurological:  Positive for tremors.  Psychiatric/Behavioral:  Positive for sleep disturbance.   All other systems reviewed and are negative.  There were no vitals taken for this visit.There is no height or weight on file to calculate BMI.  General Appearance: NA  Eye Contact:  NA  Speech:  Clear and Coherent  Volume:  Normal  Mood:  Euthymic  Affect:  NA  Thought Process:  Goal Directed  Orientation:  Full (Time, Place, and Person)  Thought Content: WDL   Suicidal Thoughts:  No  Homicidal Thoughts:  No  Memory:  Immediate;   Good Recent;   Good Remote;   Good  Judgement:  Good  Insight:  Good  Psychomotor Activity:  Tremor  Concentration:  Concentration: Good and Attention Span: Good  Recall:  Good  Fund of Knowledge: Good  Language: Good  Akathisia:  No  Handed:  Right  AIMS (if indicated): not done  Assets:  Communication Skills Desire for Improvement Physical Health Resilience Social Support Talents/Skills  ADL's:  Intact  Cognition: WNL  Sleep:  Fair  Screenings: PHQ2-9    Flowsheet Row Video Visit from 09/24/2020 in [place] Video Visit from 06/24/2020 in [place] Video Visit from 03/19/2020 in [place] [facility]  PHQ-2 Total Score 0 0 0      Flowsheet Row Video Visit from  09/24/2020 in [place] Video Visit from 06/24/2020 in [place] Video Visit from 03/19/2020 in [place] No Risk No Risk No Risk        Assessment and Plan: This patient is a 70 year old female with a history of bipolar disorder.  For the most part she is doing well.  She is not sleeping quite as well so she will add the Valium 2 mg at she uses as needed twice daily to the 10 mg at bedtime for anxiety and sleep.  She will continue Wellbutrin XL 150 mg every morning for depression and propranolol LA 60 mg every morning for anxiety and tremor.  She will return to see me in 3 months   Levonne Spiller, MD 09/24/2020, 10:13 AM

## 2020-10-11 DIAGNOSIS — E782 Mixed hyperlipidemia: Secondary | ICD-10-CM | POA: Diagnosis not present

## 2020-10-11 DIAGNOSIS — K219 Gastro-esophageal reflux disease without esophagitis: Secondary | ICD-10-CM | POA: Diagnosis not present

## 2020-10-11 DIAGNOSIS — E1169 Type 2 diabetes mellitus with other specified complication: Secondary | ICD-10-CM | POA: Diagnosis not present

## 2020-10-11 DIAGNOSIS — E7800 Pure hypercholesterolemia, unspecified: Secondary | ICD-10-CM | POA: Diagnosis not present

## 2020-10-18 DIAGNOSIS — E1165 Type 2 diabetes mellitus with hyperglycemia: Secondary | ICD-10-CM | POA: Diagnosis not present

## 2020-10-18 DIAGNOSIS — E782 Mixed hyperlipidemia: Secondary | ICD-10-CM | POA: Diagnosis not present

## 2020-10-18 DIAGNOSIS — K219 Gastro-esophageal reflux disease without esophagitis: Secondary | ICD-10-CM | POA: Diagnosis not present

## 2020-10-18 DIAGNOSIS — E7849 Other hyperlipidemia: Secondary | ICD-10-CM | POA: Diagnosis not present

## 2020-10-18 DIAGNOSIS — R5383 Other fatigue: Secondary | ICD-10-CM | POA: Diagnosis not present

## 2020-10-21 ENCOUNTER — Other Ambulatory Visit: Payer: Self-pay | Admitting: Family Medicine

## 2020-10-21 DIAGNOSIS — Z1231 Encounter for screening mammogram for malignant neoplasm of breast: Secondary | ICD-10-CM

## 2020-10-30 DIAGNOSIS — E7849 Other hyperlipidemia: Secondary | ICD-10-CM | POA: Diagnosis not present

## 2020-10-30 DIAGNOSIS — F17210 Nicotine dependence, cigarettes, uncomplicated: Secondary | ICD-10-CM | POA: Diagnosis not present

## 2020-10-30 DIAGNOSIS — G252 Other specified forms of tremor: Secondary | ICD-10-CM | POA: Diagnosis not present

## 2020-10-30 DIAGNOSIS — N1831 Chronic kidney disease, stage 3a: Secondary | ICD-10-CM | POA: Diagnosis not present

## 2020-10-30 DIAGNOSIS — Z23 Encounter for immunization: Secondary | ICD-10-CM | POA: Diagnosis not present

## 2020-10-30 DIAGNOSIS — K589 Irritable bowel syndrome without diarrhea: Secondary | ICD-10-CM | POA: Diagnosis not present

## 2020-10-30 DIAGNOSIS — E1169 Type 2 diabetes mellitus with other specified complication: Secondary | ICD-10-CM | POA: Diagnosis not present

## 2020-10-30 DIAGNOSIS — K7460 Unspecified cirrhosis of liver: Secondary | ICD-10-CM | POA: Diagnosis not present

## 2020-11-05 ENCOUNTER — Other Ambulatory Visit: Payer: Self-pay

## 2020-11-05 ENCOUNTER — Ambulatory Visit
Admission: RE | Admit: 2020-11-05 | Discharge: 2020-11-05 | Disposition: A | Discharge status: Home / Self Care | Payer: Medicare Other | Source: Ambulatory Visit | Attending: Family Medicine | Admitting: Family Medicine

## 2020-11-05 DIAGNOSIS — Z1231 Encounter for screening mammogram for malignant neoplasm of breast: Secondary | ICD-10-CM | POA: Diagnosis not present

## 2020-11-11 DIAGNOSIS — K219 Gastro-esophageal reflux disease without esophagitis: Secondary | ICD-10-CM | POA: Diagnosis not present

## 2020-11-11 DIAGNOSIS — E782 Mixed hyperlipidemia: Secondary | ICD-10-CM | POA: Diagnosis not present

## 2020-11-11 DIAGNOSIS — E1169 Type 2 diabetes mellitus with other specified complication: Secondary | ICD-10-CM | POA: Diagnosis not present

## 2020-11-11 DIAGNOSIS — E7800 Pure hypercholesterolemia, unspecified: Secondary | ICD-10-CM | POA: Diagnosis not present

## 2020-11-13 ENCOUNTER — Other Ambulatory Visit (HOSPITAL_COMMUNITY): Payer: Self-pay | Admitting: Psychiatry

## 2020-11-18 DIAGNOSIS — R059 Cough, unspecified: Secondary | ICD-10-CM | POA: Diagnosis not present

## 2020-11-18 DIAGNOSIS — J209 Acute bronchitis, unspecified: Secondary | ICD-10-CM | POA: Diagnosis not present

## 2020-12-11 DIAGNOSIS — K219 Gastro-esophageal reflux disease without esophagitis: Secondary | ICD-10-CM | POA: Diagnosis not present

## 2020-12-11 DIAGNOSIS — E7800 Pure hypercholesterolemia, unspecified: Secondary | ICD-10-CM | POA: Diagnosis not present

## 2020-12-11 DIAGNOSIS — E782 Mixed hyperlipidemia: Secondary | ICD-10-CM | POA: Diagnosis not present

## 2020-12-11 DIAGNOSIS — E1169 Type 2 diabetes mellitus with other specified complication: Secondary | ICD-10-CM | POA: Diagnosis not present

## 2020-12-18 ENCOUNTER — Telehealth (INDEPENDENT_AMBULATORY_CARE_PROVIDER_SITE_OTHER): Payer: Medicare Other | Admitting: Psychiatry

## 2020-12-18 ENCOUNTER — Other Ambulatory Visit: Payer: Self-pay

## 2020-12-18 ENCOUNTER — Encounter (HOSPITAL_COMMUNITY): Payer: Self-pay | Admitting: Psychiatry

## 2020-12-18 DIAGNOSIS — F3162 Bipolar disorder, current episode mixed, moderate: Secondary | ICD-10-CM

## 2020-12-18 MED ORDER — DIAZEPAM 2 MG PO TABS: ORAL_TABLET | ORAL | 2 refills | Status: DC

## 2020-12-18 MED ORDER — BUPROPION HCL ER (XL) 150 MG PO TB24: 150.0000 mg | ORAL_TABLET | Freq: Every morning | ORAL | 2 refills | Status: DC

## 2020-12-18 MED ORDER — PROPRANOLOL HCL ER 60 MG PO CP24: 60.0000 mg | ORAL_CAPSULE | Freq: Every morning | ORAL | 2 refills | Status: DC

## 2020-12-18 MED ORDER — DIAZEPAM 10 MG PO TABS: 10.0000 mg | ORAL_TABLET | Freq: Every day | ORAL | 2 refills | Status: DC

## 2020-12-18 NOTE — Progress Notes (Signed)
Virtual Visit via Telephone Note  I connected with Andrea Dickson on 12/18/20 at  2:00 PM EST by telephone and verified that I am speaking with the correct person using two identifiers.  Location: Patient: home Provider: office   I discussed the limitations, risks, security and privacy concerns of performing an evaluation and management service by telephone and the availability of in person appointments. I also discussed with the patient that there may be a patient responsible charge related to this service. The patient expressed understanding and agreed to proceed.       I discussed the assessment and treatment plan with the patient. The patient was provided an opportunity to ask questions and all were answered. The patient agreed with the plan and demonstrated an understanding of the instructions.   The patient was advised to call back or seek an in-person evaluation if the symptoms worsen or if the condition fails to improve as anticipated.  I provided 14 minutes of non-face-to-face time during this encounter.   Levonne Spiller, MD  [facility] MD/PA/NP OP Progress Note  12/18/2020 2:27 PM Andrea Dickson  MRN:  [id number]  Chief Complaint:  Chief Complaint   Depression; Manic Behavior; Anxiety    HPI: This patient is a 70 year old separated white female who lives alone in [place]. She has 1 son and 3 grandchildren. She worked as a Engineer, technical sales for [employer].   she was referred by her primary physician, Dr. Pleas Koch for further assessment and treatment of possible bipolar disorder.  The patient returns for follow-up after 3 months.  She states that she has been doing very well.  Her mood has been stable and she denies depressive and manic symptoms.  Most the time she is sleeping well now.  She is not tired during the day.  She still has mild tremor in her hand by her report but it is no longer as bad as it used to be and she no longer has a postural tremor.  She thinks her  medications are working well for her mood swings. Visit Diagnosis:    ICD-10-CM   1. Bipolar 1 disorder, mixed, moderate (HCC)  F31.62       Past Psychiatric History: Past outpatient treatment  Past Medical History:  Past Medical History:  Diagnosis Date   Anxiety    Asthma    Bipolar 1 disorder ([place])    Chronic diarrhea    Cirrhosis ([place])    Diagnosed September 2020, likely secondary to [facility], s/p Hep A/B vaccination in 2021.    Depression    Diabetes ([place])    Essential tremor    GERD (gastroesophageal reflux disease)    Headache    High cholesterol     Past Surgical History:  Procedure Laterality Date   ABDOMINAL HYSTERECTOMY  1997   BIOPSY  01/17/2018   Procedure: BIOPSY;  Surgeon: Daneil Dolin, MD;  [facility];  Service: Endoscopy;;  colon   carpal tunnel Bilateral 1999, 06/2009   COLONOSCOPY  2013   Dr. Britta Mccreedy: no colon polyps. quality of prep was fair. repeat colonoscopy in five years.    COLONOSCOPY WITH PROPOFOL N/A 01/17/2018   Dr. Gala Romney: Colonic lipoma, 2 tubular adenomas removed.  Random colon biopsies negative.  Next colonoscopy 5 years.   ESOPHAGOGASTRODUODENOSCOPY (EGD) WITH PROPOFOL N/A 04/06/2019   Procedure: ESOPHAGOGASTRODUODENOSCOPY (EGD) WITH PROPOFOL;  Surgeon: Daneil Dolin, MD;   Normal esophagus, mild portal hypertensive gastropathy, otherwise normal exam.  Recommend repeat EGD in  2 years for variceal screening.    POLYPECTOMY  01/17/2018   Procedure: POLYPECTOMY;  Surgeon: Daneil Dolin, MD;  [facility];  Service: Endoscopy;;  colon   SKIN CANCER EXCISION  2007   Alpha   ulner nerve  Left 06/2009   decompression    Family Psychiatric History: see below  Family History:  Family History  Problem Relation Age of Onset   Diabetes Mother    Brain cancer Mother    Diabetes Father    Heart attack Father    Heart disease Father    Diabetes Sister    Bipolar disorder Brother    Diabetes Brother    Heart  disease Brother    Colon cancer Neg Hx    Breast cancer Neg Hx     Social History:  Social History   Socioeconomic History   Marital status: Divorced    Spouse name: Not on file   Number of children: 1   Years of education: 12   Highest education level: Not on file  Occupational History   Occupation: retired    Comment: employed at [employer] Use   Smoking status: Every Day    Packs/day: 0.50    Years: 43.00    Pack years: 21.50    Types: Cigarettes   Smokeless tobacco: Never   Tobacco comments:    smoking since 70yrs old.    Vaping Use   Vaping Use: Never used  Substance and Sexual Activity   Alcohol use: No    Alcohol/week: 0.0 standard drinks   Drug use: No   Sexual activity: Not Currently  Other Topics Concern   Not on file  Social History Narrative   Lives alone.  Retired.  Education 12th grade.  One child.     Caffeine use- sodas, 2 daily   Social Determinants of Health   Financial Resource Strain: Not on file  Food Insecurity: Not on file  Transportation Needs: Not on file  Physical Activity: Not on file  Stress: Not on file  Social Connections: Not on file    Allergies:  Allergies  Allergen Reactions   Pramipexole Other (See Comments)    Shaking, palpitations, headache, faint feeling   Crestor [Rosuvastatin Calcium]     Muscle pain   Pollen Extract Other (See Comments)    Eyes and nose run    Metabolic Disorder Labs: Lab Results  Component Value Date   HGBA1C 5.4 04/24/2015   MPG 108 04/24/2015   No results found for: PROLACTIN Lab Results  Component Value Date   CHOL 178 02/17/2016   TRIG 229 (H) 02/17/2016   HDL 30 (L) 02/17/2016   CHOLHDL 5.9 (H) 02/17/2016   VLDL 46 (H) 02/17/2016   LDLCALC 102 (H) 02/17/2016   LDLCALC 126 (H) 11/18/2015   Lab Results  Component Value Date   TSH 2.96 08/29/2019   TSH 2.27 10/13/2018    Therapeutic Level Labs: Lab Results  Component Value Date   LITHIUM 0.8 08/29/2019    LITHIUM 0.4 (L) 10/13/2018   Lab Results  Component Value Date   VALPROATE 62.2 08/20/2016   VALPROATE 83.0 10/14/2015   No components found for:  CBMZ  Current Medications: Current Outpatient Medications  Medication Sig Dispense Refill   acetaminophen (TYLENOL) 500 MG tablet Take 1,000 mg by mouth 2 (two) times daily as needed for moderate pain or headache.     buPROPion (WELLBUTRIN XL) 150 MG 24 hr tablet Take  1 tablet (150 mg total) by mouth every morning. 30 tablet 2   cetirizine (ZYRTEC) 10 MG tablet Take 10 mg by mouth daily.     Cholecalciferol (VITAMIN D3) 50 MCG (2000 UT) TABS Take 2,000 Units by mouth daily.      diazepam (VALIUM) 10 MG tablet Take 1 tablet (10 mg total) by mouth at bedtime. 60 tablet 2   diazepam (VALIUM) 2 MG tablet TAKE ONE TABLET BY MOUTH TWO times daily AS NEEDED FOR ANXIETY 60 tablet 2   estrogens, conjugated, (PREMARIN) 1.25 MG tablet Take 1.25 mg by mouth daily.      ezetimibe (ZETIA) 10 MG tablet Take 10 mg by mouth daily.     glipiZIDE (GLUCOTROL) 10 MG tablet Take 10 mg by mouth daily.     IGLUCOSE TEST STRIPS test strip daily. use as directed     loperamide (IMODIUM) 2 MG capsule Take 2 mg by mouth as needed for diarrhea or loose stools.     methocarbamol (ROBAXIN) 500 MG tablet Take 500 mg by mouth daily as needed for muscle spasms. (Patient not taking: Reported on 02/26/2020)     nitrofurantoin (MACRODANTIN) 50 MG capsule Take 1 capsule (50 mg total) by mouth at bedtime. 90 capsule 3   omeprazole (PRILOSEC) 40 MG capsule Take 40 mg by mouth daily.     ondansetron (ZOFRAN) 4 MG tablet TAKE 1 TABLET BY MOUTH EVERY 8 HOURS AS NEEDED FOR NAUSEA AND VOMITING (Patient taking differently: Take 4 mg by mouth every 8 (eight) hours as needed for nausea or vomiting.) 20 tablet 0   propranolol ER (INDERAL LA) 60 MG 24 hr capsule Take 1 capsule (60 mg total) by mouth every morning. 30 capsule 2   RYBELSUS 7 MG TABS Take 1 tablet by mouth daily.     No current  facility-administered medications for this visit.     Musculoskeletal: Strength & Muscle Tone: na Gait & Station: na Patient leans: N/A  Psychiatric Specialty Exam: Review of Systems  Neurological:  Positive for tremors.  All other systems reviewed and are negative.  There were no vitals taken for this visit.There is no height or weight on file to calculate BMI.  General Appearance: NA  Eye Contact:  Good  Speech:  Clear and Coherent  Volume:  Normal  Mood:  Euthymic  Affect:  NA  Thought Process:  Goal Directed  Orientation:  Full (Time, Place, and Person)  Thought Content: WDL   Suicidal Thoughts:  No  Homicidal Thoughts:  No  Memory:  Immediate;   Good Recent;   Good Remote;   Good  Judgement:  Good  Insight:  Fair  Psychomotor Activity:  Normal  Concentration:  Concentration: Good and Attention Span: Good  Recall:  Good  Fund of Knowledge: Good  Language: Good  Akathisia:  No  Handed:  Right  AIMS (if indicated): not done  Assets:  Communication Skills Desire for Improvement Resilience Social Support Talents/Skills  ADL's:  Intact  Cognition: WNL  Sleep:  Good   Screenings: PHQ2-9    Flowsheet Row Video Visit from 12/18/2020 in [place] Video Visit from 09/24/2020 in [place] Video Visit from 06/24/2020 in [place] [facility] Video Visit from 03/19/2020 in [place] [facility]  PHQ-2 Total Score 0 0 0 0      Flowsheet Row Video Visit from 12/18/2020 in [place] [facility] Video Visit from 09/24/2020 in [place]  [facility] Video Visit from 06/24/2020 in [place] No Risk No Risk No Risk        Assessment and Plan: This patient is a 70 year old female with a history of  bipolar disorder.  For the most part she continues to do well.  She will continue Valium 2 mg twice daily as needed for anxiety as well as Valium 10 mg at bedtime for anxiety and sleep, Wellbutrin XL 150 mg every morning for depression and propranolol LA 60 mg every morning for anxiety and tremor.  She will return to see me in 3 months   Levonne Spiller, MD 12/18/2020, 2:27 PM

## 2020-12-19 DIAGNOSIS — Z122 Encounter for screening for malignant neoplasm of respiratory organs: Secondary | ICD-10-CM | POA: Diagnosis not present

## 2020-12-19 DIAGNOSIS — F17210 Nicotine dependence, cigarettes, uncomplicated: Secondary | ICD-10-CM | POA: Diagnosis not present

## 2020-12-19 DIAGNOSIS — K7460 Unspecified cirrhosis of liver: Secondary | ICD-10-CM | POA: Diagnosis not present

## 2020-12-19 DIAGNOSIS — I700 Atherosclerosis of aorta: Secondary | ICD-10-CM | POA: Diagnosis not present

## 2020-12-19 DIAGNOSIS — J439 Emphysema, unspecified: Secondary | ICD-10-CM | POA: Diagnosis not present

## 2021-01-10 DIAGNOSIS — K219 Gastro-esophageal reflux disease without esophagitis: Secondary | ICD-10-CM | POA: Diagnosis not present

## 2021-01-10 DIAGNOSIS — E7800 Pure hypercholesterolemia, unspecified: Secondary | ICD-10-CM | POA: Diagnosis not present

## 2021-01-10 DIAGNOSIS — E782 Mixed hyperlipidemia: Secondary | ICD-10-CM | POA: Diagnosis not present

## 2021-01-10 DIAGNOSIS — E1169 Type 2 diabetes mellitus with other specified complication: Secondary | ICD-10-CM | POA: Diagnosis not present

## 2021-01-20 ENCOUNTER — Other Ambulatory Visit: Payer: Self-pay | Admitting: Urology

## 2021-01-20 DIAGNOSIS — N390 Urinary tract infection, site not specified: Secondary | ICD-10-CM

## 2021-02-09 DIAGNOSIS — E7800 Pure hypercholesterolemia, unspecified: Secondary | ICD-10-CM | POA: Diagnosis not present

## 2021-02-09 DIAGNOSIS — K219 Gastro-esophageal reflux disease without esophagitis: Secondary | ICD-10-CM | POA: Diagnosis not present

## 2021-02-09 DIAGNOSIS — E782 Mixed hyperlipidemia: Secondary | ICD-10-CM | POA: Diagnosis not present

## 2021-02-09 DIAGNOSIS — E1169 Type 2 diabetes mellitus with other specified complication: Secondary | ICD-10-CM | POA: Diagnosis not present

## 2021-02-24 DIAGNOSIS — R1032 Left lower quadrant pain: Secondary | ICD-10-CM | POA: Diagnosis not present

## 2021-03-05 DIAGNOSIS — R188 Other ascites: Secondary | ICD-10-CM | POA: Diagnosis not present

## 2021-03-05 DIAGNOSIS — R935 Abnormal findings on diagnostic imaging of other abdominal regions, including retroperitoneum: Secondary | ICD-10-CM | POA: Diagnosis not present

## 2021-03-05 DIAGNOSIS — I700 Atherosclerosis of aorta: Secondary | ICD-10-CM | POA: Diagnosis not present

## 2021-03-05 DIAGNOSIS — R109 Unspecified abdominal pain: Secondary | ICD-10-CM | POA: Diagnosis not present

## 2021-03-05 DIAGNOSIS — R1032 Left lower quadrant pain: Secondary | ICD-10-CM | POA: Diagnosis not present

## 2021-03-05 DIAGNOSIS — K7460 Unspecified cirrhosis of liver: Secondary | ICD-10-CM | POA: Diagnosis not present

## 2021-03-05 DIAGNOSIS — R161 Splenomegaly, not elsewhere classified: Secondary | ICD-10-CM | POA: Diagnosis not present

## 2021-03-11 DIAGNOSIS — E1169 Type 2 diabetes mellitus with other specified complication: Secondary | ICD-10-CM | POA: Diagnosis not present

## 2021-03-11 DIAGNOSIS — E7800 Pure hypercholesterolemia, unspecified: Secondary | ICD-10-CM | POA: Diagnosis not present

## 2021-03-16 ENCOUNTER — Other Ambulatory Visit (HOSPITAL_COMMUNITY): Payer: Self-pay | Admitting: Psychiatry

## 2021-03-18 ENCOUNTER — Other Ambulatory Visit: Payer: Self-pay

## 2021-03-18 ENCOUNTER — Encounter: Payer: Self-pay | Admitting: Internal Medicine

## 2021-03-18 ENCOUNTER — Ambulatory Visit (INDEPENDENT_AMBULATORY_CARE_PROVIDER_SITE_OTHER): Payer: Medicare Other | Admitting: Internal Medicine

## 2021-03-18 ENCOUNTER — Telehealth (HOSPITAL_COMMUNITY): Payer: Medicare Other | Admitting: Psychiatry

## 2021-03-18 VITALS — BP 120/60 | HR 70 | Temp 97.3°F | Ht 66.0 in | Wt 159.8 lb

## 2021-03-18 DIAGNOSIS — K529 Noninfective gastroenteritis and colitis, unspecified: Secondary | ICD-10-CM

## 2021-03-18 DIAGNOSIS — K7460 Unspecified cirrhosis of liver: Secondary | ICD-10-CM

## 2021-03-18 DIAGNOSIS — K7469 Other cirrhosis of liver: Secondary | ICD-10-CM | POA: Diagnosis not present

## 2021-03-18 DIAGNOSIS — K219 Gastro-esophageal reflux disease without esophagitis: Secondary | ICD-10-CM | POA: Diagnosis not present

## 2021-03-18 NOTE — Patient Instructions (Signed)
It was great seeing you again today! ? ?Information on a 2 g sodium diet provided ? ?Updated blood work to include alpha-fetoprotein, CBC, INR, Chem-12 ? ?Further recommendations to follow once blood work is back for review ? ?Plan to see you back in 6 months.  We will plan for surveillance colonoscopy in 2025. ? ?Moving forward, we will check an x-ray (ultrasound) of your liver to screen for tumors every 6 months. ? ?For now, may use dicyclomine or Bentyl as needed for loose stools ?

## 2021-03-18 NOTE — Progress Notes (Signed)
Primary Care Physician:  Curlene Labrum, MD Primary Gastroenterologist:  Dr.Len Kluver  Pre-Procedure History & Physical: HPI:  Andrea Dickson is a 71 y.o. female with a history of bipolar and NASH cirrhosis referred for further evaluation.   Patient fairly well-known to our practice.  Last seen in our office in 2021.  Felt to have well compensated cirrhosis on the basis of [place].  She has been previously vaccinated against hepatitis A and B. Prior liver evaluation revealed negative ANA, AMA immunoglobulins.  Hepatitis C antibody negative.  Iron studies normal. Chronic GI problems over the years include chronic left lower quadrant / left flank abdominal pain and tendency towards diarrhea.  Extensive evaluation through this office has included: Biopsies negative for microscopic colitis, negative celiac screen, negative stool studies fecal elastase.  Lactose-free diet and empiric course of pancreatic enzyme supplements made no difference in her symptoms.  She has not had any nausea or vomiting.  Historically, omeprazole 40 mg daily has controlled her reflux symptoms well. No dysphagia Patient tells me she is seeing the urologist in the near future to further evaluate her abdominal complaints from their perspective. She tends to take dicyclomine 20 mg 3 times a day as needed for diarrhea.  She does take it on a regular basis to avoid diarrhea and rare episodes of incontinence.  She has not had any bleeding.  Colonic adenoma removed from her colon in 2020; due for surveillance colonoscopy 2025. EGD 2021 demonstrated portal gastropathy but no evidence of esophageal varices.  Patient does take propranolol 60 mg daily for benign essential tremor.  No family history of liver disease.  No history of alcohol use.  Past Medical History:  Diagnosis Date   Anxiety    Asthma    Bipolar 1 disorder ([place])    Chronic diarrhea    Cirrhosis ([place])    Diagnosed September 2020, likely secondary to [facility], s/p  Hep A/B vaccination in 2021.    Depression    Diabetes ([place])    Essential tremor    GERD (gastroesophageal reflux disease)    Headache    High cholesterol     Past Surgical History:  Procedure Laterality Date   ABDOMINAL HYSTERECTOMY  1997   BIOPSY  01/17/2018   Procedure: BIOPSY;  Surgeon: Daneil Dolin, MD;  [facility];  Service: Endoscopy;;  colon   carpal tunnel Bilateral 1999, 06/2009   COLONOSCOPY  2013   Dr. Britta Mccreedy: no colon polyps. quality of prep was fair. repeat colonoscopy in five years.    COLONOSCOPY WITH PROPOFOL N/A 01/17/2018   Dr. Gala Romney: Colonic lipoma, 2 tubular adenomas removed.  Random colon biopsies negative.  Next colonoscopy 5 years.   ESOPHAGOGASTRODUODENOSCOPY (EGD) WITH PROPOFOL N/A 04/06/2019   Procedure: ESOPHAGOGASTRODUODENOSCOPY (EGD) WITH PROPOFOL;  Surgeon: Daneil Dolin, MD;   Normal esophagus, mild portal hypertensive gastropathy, otherwise normal exam.  Recommend repeat EGD in 2 years for variceal screening.    POLYPECTOMY  01/17/2018   Procedure: POLYPECTOMY;  Surgeon: Daneil Dolin, MD;  [facility];  Service: Endoscopy;;  colon   SKIN CANCER EXCISION  2007   TONSILLECTOMY  1965   ulner nerve  Left 06/2009   decompression    Prior to Admission medications   Medication Sig Start Date End Date Taking? Authorizing Provider  acetaminophen (TYLENOL) 500 MG tablet Take 1,000 mg by mouth 2 (two) times daily as needed for moderate pain or headache.   Yes [provider]  buPROPion (WELLBUTRIN XL) 150 MG 24 hr tablet TAKE ONE TABLET BY MOUTH EVERY MORNING 03/17/21  Yes Cloria Spring, MD  cetirizine (ZYRTEC) 10 MG tablet Take 10 mg by mouth daily.   Yes [provider]  Cholecalciferol (VITAMIN D3) 50 MCG (2000 UT) TABS Take 2,000 Units by mouth daily.    Yes [provider]  diazepam (VALIUM) 10 MG tablet Take 1 tablet (10 mg total) by mouth at bedtime. 12/18/20  Yes Cloria Spring, MD  diazepam (VALIUM)  2 MG tablet TAKE ONE TABLET BY MOUTH TWO times daily AS NEEDED FOR ANXIETY 12/18/20  Yes Cloria Spring, MD  estrogens, conjugated, (PREMARIN) 1.25 MG tablet Take 1.25 mg by mouth daily.    Yes [provider]  ezetimibe (ZETIA) 10 MG tablet Take 10 mg by mouth daily. 10/19/19  Yes [provider]  glipiZIDE (GLUCOTROL) 10 MG tablet Take 10 mg by mouth daily. 04/18/20  Yes [provider]  IGLUCOSE TEST STRIPS test strip daily. use as directed 12/28/19  Yes [provider]  loperamide (IMODIUM) 2 MG capsule Take 2 mg by mouth as needed for diarrhea or loose stools.   Yes [provider]  nitrofurantoin (MACRODANTIN) 50 MG capsule TAKE ONE CAPSULE BY MOUTH EVERYDAY AT BEDTIME 01/20/21  Yes [place], Berneice Heinrich, PA-C  omeprazole (PRILOSEC) 40 MG capsule Take 40 mg by mouth daily. 03/11/19  Yes [provider]  ondansetron (ZOFRAN) 4 MG tablet TAKE 1 TABLET BY MOUTH EVERY 8 HOURS AS NEEDED FOR NAUSEA AND VOMITING Patient taking differently: Take 4 mg by mouth every 8 (eight) hours as needed for nausea or vomiting. 09/14/18  Yes Mahala Menghini, PA-C  propranolol ER (INDERAL LA) 60 MG 24 hr capsule TAKE ONE CAPSULE BY MOUTH EVERY MORNING 03/17/21  Yes Cloria Spring, MD  methocarbamol (ROBAXIN) 500 MG tablet Take 500 mg by mouth daily as needed for muscle spasms. Patient not taking: Reported on 02/26/2020    [provider]  RYBELSUS 7 MG TABS Take 1 tablet by mouth daily. Patient not taking: Reported on 03/18/2021 06/11/20   [provider]    Allergies as of 03/18/2021 - Review Complete 03/18/2021  Allergen Reaction Noted   Pramipexole Other (See Comments) 04/24/2015   Crestor [rosuvastatin calcium]  03/28/2019   Pollen extract Other (See Comments) 04/24/2015    Family History  Problem Relation Age of Onset   Diabetes Mother    Brain cancer Mother    Diabetes Father    Heart attack Father    Heart disease Father     Diabetes Sister    Bipolar disorder Brother    Diabetes Brother    Heart disease Brother    Colon cancer Neg Hx    Breast cancer Neg Hx     Social History   Socioeconomic History   Marital status: Divorced    Spouse name: Not on file   Number of children: 1   Years of education: 12   Highest education level: Not on file  Occupational History   Occupation: retired    Comment: employed at [employer] Use   Smoking status: Every Day    Packs/day: 0.50    Years: 43.00    Pack years: 21.50    Types: Cigarettes   Smokeless tobacco: Never   Tobacco comments:    smoking since 71yr old.    Vaping Use   Vaping Use: Never used  Substance and Sexual Activity  Alcohol use: No    Alcohol/week: 0.0 standard drinks   Drug use: No   Sexual activity: Not Currently  Other Topics Concern   Not on file  Social History Narrative   Lives alone.  Retired.  Education 12th grade.  One child.     Caffeine use- sodas, 2 daily   Social Determinants of Health   Financial Resource Strain: Not on file  Food Insecurity: Not on file  Transportation Needs: Not on file  Physical Activity: Not on file  Stress: Not on file  Social Connections: Not on file  Intimate Partner Violence: Not on file    Review of Systems: See HPI, otherwise negative ROS  Physical Exam: BP 120/60 (BP Location: Right Arm, Patient Position: Sitting, Cuff Size: Normal)    Pulse 70    Temp (!) 97.3 F (36.3 C) (Temporal)    Ht '5\' 6"'$  (1.676 m)    Wt 159 lb 12.8 oz (72.5 kg)    SpO2 97%    BMI 25.79 kg/m  General:   Alert,   pleasant and cooperative in NAD.  Mentally sharp.  Well oriented. Skin: No cutaneous stigmata of chronic liver disease. Eyes:  Sclera clear, no icterus.   Conjunctiva pink. Neck:  Supple; no masses or thyromegaly. No significant cervical adenopathy. Lungs:  Clear throughout to auscultation.   No wheezes, crackles, or rhonchi. No acute distress. Heart:  Regular rate and rhythm; no  murmurs, clicks, rubs,  or gallops. Abdomen: Nondistended.  Mildly obese.  Soft and nontender pulses:  Normal pulses noted. Extremities:  Without clubbing or edema.  Impression/Plan: Pleasant 71 year old lady with bipolar illness, cirrhosis, well compensated  - secondary to [facility] here for follow-up.  Remains well compensated.  Recent CT scan reconfirms cirrhosis with evidence of portal hypertension.  No evidence of hepatocellular carcinoma. Portal hypertension without evidence of esophageal varices on prior EGD  History of colonic adenoma; due for surveillance colonoscopy 2025.   Chronic left flank suprapubic left lower quadrant abdominal pain -I suspect a functional component although other etiologies and including urological origin need to be considered.  She has had a thorough GI work-up.  Intermittent diarrhea and episodes of incontinence likely more related to an element of irritable bowel syndrome.  Thorough GI evaluation for her symptoms previously negative as chronicled above. IBS-D appears to be a durable diagnosis.  Her MELD needs to be updated.   Recommendations:  Information on a 2 g sodium diet provided  Updated blood work to include alpha-fetoprotein, CBC, INR, Chem-12  Further recommendations to follow once blood work is back for review  Office visit 6 months.  We will plan for surveillance colonoscopy in 2025.  Moving forward, we will check hepatic ultrasound every 6 months.  For now, may use dicyclomine or Bentyl as needed for loose stools.     Notice: This dictation was prepared with Dragon dictation along with smaller phrase technology. Any transcriptional errors that result from this process are unintentional and may not be corrected upon review.

## 2021-03-20 LAB — CBC WITH DIFFERENTIAL/PLATELET
Absolute Monocytes: 183 cells/uL — ABNORMAL LOW (ref 200–950)
Basophils Absolute: 20 cells/uL (ref 0–200)
Basophils Relative: 0.7 %
Eosinophils Absolute: 41 cells/uL (ref 15–500)
Eosinophils Relative: 1.4 %
HCT: 34.3 % — ABNORMAL LOW (ref 35.0–45.0)
Hemoglobin: 11.4 g/dL — ABNORMAL LOW (ref 11.7–15.5)
Lymphs Abs: 966 cells/uL (ref 850–3900)
MCH: 27.3 pg (ref 27.0–33.0)
MCHC: 33.2 g/dL (ref 32.0–36.0)
MCV: 82.1 fL (ref 80.0–100.0)
MPV: 11.1 fL (ref 7.5–12.5)
Monocytes Relative: 6.3 %
Neutro Abs: 1691 cells/uL (ref 1500–7800)
Neutrophils Relative %: 58.3 %
Platelets: 63 10*3/uL — ABNORMAL LOW (ref 140–400)
RBC: 4.18 10*6/uL (ref 3.80–5.10)
RDW: 14 % (ref 11.0–15.0)
Total Lymphocyte: 33.3 %
WBC: 2.9 10*3/uL — ABNORMAL LOW (ref 3.8–10.8)

## 2021-03-20 LAB — COMPREHENSIVE METABOLIC PANEL
AG Ratio: 1.6 (calc) (ref 1.0–2.5)
ALT: 8 U/L (ref 6–29)
AST: 18 U/L (ref 10–35)
Albumin: 4.1 g/dL (ref 3.6–5.1)
Alkaline phosphatase (APISO): 101 U/L (ref 37–153)
BUN: 13 mg/dL (ref 7–25)
CO2: 24 mmol/L (ref 20–32)
Calcium: 9.3 mg/dL (ref 8.6–10.4)
Chloride: 108 mmol/L (ref 98–110)
Creat: 0.9 mg/dL (ref 0.50–1.05)
Globulin: 2.6 g/dL (calc) (ref 1.9–3.7)
Glucose, Bld: 109 mg/dL (ref 65–139)
Potassium: 4.9 mmol/L (ref 3.5–5.3)
Sodium: 139 mmol/L (ref 135–146)
Total Bilirubin: 0.8 mg/dL (ref 0.2–1.2)
Total Protein: 6.7 g/dL (ref 6.1–8.1)

## 2021-03-20 LAB — AFP TUMOR MARKER: AFP-Tumor Marker: 1.5 ng/mL

## 2021-03-20 LAB — PROTIME-INR
INR: 1.1
Prothrombin Time: 11.4 s (ref 9.0–11.5)

## 2021-03-26 ENCOUNTER — Encounter (HOSPITAL_COMMUNITY): Payer: Self-pay | Admitting: Psychiatry

## 2021-03-26 ENCOUNTER — Telehealth (INDEPENDENT_AMBULATORY_CARE_PROVIDER_SITE_OTHER): Payer: Medicare Other | Admitting: Psychiatry

## 2021-03-26 ENCOUNTER — Other Ambulatory Visit: Payer: Self-pay

## 2021-03-26 DIAGNOSIS — F3162 Bipolar disorder, current episode mixed, moderate: Secondary | ICD-10-CM

## 2021-03-26 MED ORDER — DIAZEPAM 2 MG PO TABS: ORAL_TABLET | ORAL | 2 refills | Status: DC

## 2021-03-26 MED ORDER — BUPROPION HCL ER (XL) 150 MG PO TB24: 150.0000 mg | ORAL_TABLET | Freq: Every morning | ORAL | 2 refills | Status: DC

## 2021-03-26 MED ORDER — DIAZEPAM 10 MG PO TABS: 10.0000 mg | ORAL_TABLET | Freq: Every day | ORAL | 2 refills | Status: DC

## 2021-03-26 MED ORDER — PROPRANOLOL HCL ER 80 MG PO CP24: 80.0000 mg | ORAL_CAPSULE | Freq: Every day | ORAL | 11 refills | Status: DC

## 2021-03-26 NOTE — Progress Notes (Signed)
Virtual Visit via Telephone Note ? ?I connected with Andrea Dickson on 03/26/21 at  1:00 PM EDT by telephone and verified that I am speaking with the correct person using two identifiers. ? ?Location: ?Patient: home ?Provider: office ?  ?I discussed the limitations, risks, security and privacy concerns of performing an evaluation and management service by telephone and the availability of in person appointments. I also discussed with the patient that there may be a patient responsible charge related to this service. The patient expressed understanding and agreed to proceed. ? ? ? ?  ?I discussed the assessment and treatment plan with the patient. The patient was provided an opportunity to ask questions and all were answered. The patient agreed with the plan and demonstrated an understanding of the instructions. ?  ?The patient was advised to call back or seek an in-person evaluation if the symptoms worsen or if the condition fails to improve as anticipated. ? ?I provided 15 minutes of non-face-to-face time during this encounter. ? ? ?Levonne Spiller, MD ? ?BH MD/PA/NP OP Progress Note ? ?03/26/2021 1:23 PM ?Andrea Dickson  ?MRN:  [id number] ? ?Chief Complaint:  ?Chief Complaint  ?Patient presents with  ? Depression  ? Anxiety  ? Manic Behavior  ? Follow-up  ? ?HPI: This patient is a 71 year old separated white female who lives alone in [place]. She has 1 son and 3 grandchildren. She worked as a Engineer, technical sales for [employer].   she was referred by her primary physician, Dr. Pleas Koch for further assessment and treatment of possible bipolar disorder. ? ?The patient returns for follow-up after 3 months.  She states that she is frustrated with her sister.  The patient is doing a lot to care for her elderly mom but the sister is not helping much.  However when questioned at length she denies being depressed.  Her energy and appetite are pretty good and she is sleeping well most of the time.  She states that she  still has the tremor in her hands that has been diagnosed as benign essential tremor and it may be getting a little bit worse.  I offered to increase the propranolol and she thinks this would be a good idea.  She is not having significant mood swings manic episodes irritability anxiety .  She denies thoughts of self-harm or suicidal ideation ?Visit Diagnosis:  ?  ICD-10-CM   ?1. Bipolar 1 disorder, mixed, moderate ([place])  F31.62   ?  ? ? ?Past Psychiatric History: Past outpatient treatment ? ?Past Medical History:  ?Past Medical History:  ?Diagnosis Date  ? Anxiety   ? Asthma   ? Bipolar 1 disorder ([place])   ? Chronic diarrhea   ? Cirrhosis ([place])   ? Diagnosed September 2020, likely secondary to [facility], s/p Hep A/B vaccination in 2021.   ? Depression   ? Diabetes ([place])   ? Essential tremor   ? GERD (gastroesophageal reflux disease)   ? Headache   ? High cholesterol   ?  ?Past Surgical History:  ?Procedure Laterality Date  ? ABDOMINAL HYSTERECTOMY  1997  ? BIOPSY  01/17/2018  ? Procedure: BIOPSY;  Surgeon: Daneil Dolin, MD;  [facility];  Service: Endoscopy;;  colon  ? carpal tunnel Bilateral 1999, 06/2009  ? COLONOSCOPY  2013  ? Dr. Britta Mccreedy: no colon polyps. quality of prep was fair. repeat colonoscopy in five years.   ? COLONOSCOPY WITH PROPOFOL N/A 01/17/2018  ? Dr. Gala Romney: Colonic lipoma, 2 tubular  adenomas removed.  Random colon biopsies negative.  Next colonoscopy 5 years.  ? ESOPHAGOGASTRODUODENOSCOPY (EGD) WITH PROPOFOL N/A 04/06/2019  ? Procedure: ESOPHAGOGASTRODUODENOSCOPY (EGD) WITH PROPOFOL;  Surgeon: Daneil Dolin, MD;   Normal esophagus, mild portal hypertensive gastropathy, otherwise normal exam.  Recommend repeat EGD in 2 years for variceal screening.   ? POLYPECTOMY  01/17/2018  ? Procedure: POLYPECTOMY;  Surgeon: Daneil Dolin, MD;  [facility];  Service: Endoscopy;;  colon  ? SKIN CANCER EXCISION  2007  ? TONSILLECTOMY  1965  ? ulner nerve  Left 06/2009  ? decompression  ? ? ?Family  Psychiatric History: See below ? ?Family History:  ?Family History  ?Problem Relation Age of Onset  ? Diabetes Mother   ? Brain cancer Mother   ? Diabetes Father   ? Heart attack Father   ? Heart disease Father   ? Diabetes Sister   ? Bipolar disorder Brother   ? Diabetes Brother   ? Heart disease Brother   ? Colon cancer Neg Hx   ? Breast cancer Neg Hx   ? ? ?Social History:  ?Social History  ? ?Socioeconomic History  ? Marital status: Divorced  ?  Spouse name: Not on file  ? Number of children: 1  ? Years of education: 71  ? Highest education level: Not on file  ?Occupational History  ? Occupation: retired  ?  Comment: employed at [employer]  ?Tobacco Use  ? Smoking status: Every Day  ?  Packs/day: 0.50  ?  Years: 43.00  ?  Pack years: 21.50  ?  Types: Cigarettes  ? Smokeless tobacco: Never  ? Tobacco comments:  ?  smoking since 71yr old.    ?Vaping Use  ? Vaping Use: Never used  ?Substance and Sexual Activity  ? Alcohol use: No  ?  Alcohol/week: 0.0 standard drinks  ? Drug use: No  ? Sexual activity: Not Currently  ?Other Topics Concern  ? Not on file  ?Social History Narrative  ? Lives alone.  Retired.  Education 12th grade.  One child.    ? Caffeine use- sodas, 2 daily  ? ?Social Determinants of Health  ? ?Financial Resource Strain: Not on file  ?Food Insecurity: Not on file  ?Transportation Needs: Not on file  ?Physical Activity: Not on file  ?Stress: Not on file  ?Social Connections: Not on file  ? ? ?Allergies:  ?Allergies  ?Allergen Reactions  ? Pramipexole Other (See Comments)  ?  Shaking, palpitations, headache, faint feeling  ? Crestor [Rosuvastatin Calcium]   ?  Muscle pain  ? Pollen Extract Other (See Comments)  ?  Eyes and nose run  ? ? ?Metabolic Disorder Labs: ?Lab Results  ?Component Value Date  ? HGBA1C 5.4 04/24/2015  ? MPG 108 04/24/2015  ? ?No results found for: PROLACTIN ?Lab Results  ?Component Value Date  ? CHOL 178 02/17/2016  ? TRIG 229 (H) 02/17/2016  ? HDL 30 (L) 02/17/2016  ?  CHOLHDL 5.9 (H) 02/17/2016  ? VLDL 46 (H) 02/17/2016  ? LDLCALC 102 (H) 02/17/2016  ? LDLCALC 126 (H) 11/18/2015  ? ?Lab Results  ?Component Value Date  ? TSH 2.96 08/29/2019  ? TSH 2.27 10/13/2018  ? ? ?Therapeutic Level Labs: ?Lab Results  ?Component Value Date  ? LITHIUM 0.8 08/29/2019  ? LITHIUM 0.4 (L) 10/13/2018  ? ?Lab Results  ?Component Value Date  ? VALPROATE 62.2 08/20/2016  ? VALPROATE 83.0 10/14/2015  ? ?No components found for:  CBMZ ? ?Current Medications: ?Current Outpatient Medications  ?Medication Sig Dispense Refill  ? acetaminophen (TYLENOL) 500 MG tablet Take 1,000 mg by mouth 2 (two) times daily as needed for moderate pain or headache.    ? buPROPion (WELLBUTRIN XL) 150 MG 24 hr tablet TAKE ONE TABLET BY MOUTH EVERY MORNING 30 tablet 2  ? cetirizine (ZYRTEC) 10 MG tablet Take 10 mg by mouth daily.    ? Cholecalciferol (VITAMIN D3) 50 MCG (2000 UT) TABS Take 2,000 Units by mouth daily.     ? diazepam (VALIUM) 10 MG tablet Take 1 tablet (10 mg total) by mouth at bedtime. 60 tablet 2  ? diazepam (VALIUM) 2 MG tablet TAKE ONE TABLET BY MOUTH TWO times daily AS NEEDED FOR ANXIETY 60 tablet 2  ? estrogens, conjugated, (PREMARIN) 1.25 MG tablet Take 1.25 mg by mouth daily.     ? ezetimibe (ZETIA) 10 MG tablet Take 10 mg by mouth daily.    ? IGLUCOSE TEST STRIPS test strip daily. use as directed    ? loperamide (IMODIUM) 2 MG capsule Take 2 mg by mouth as needed for diarrhea or loose stools.    ? methocarbamol (ROBAXIN) 500 MG tablet Take 500 mg by mouth daily as needed for muscle spasms. (Patient not taking: Reported on 02/26/2020)    ? nitrofurantoin (MACRODANTIN) 50 MG capsule TAKE ONE CAPSULE BY MOUTH EVERYDAY AT BEDTIME 90 capsule 0  ? omeprazole (PRILOSEC) 40 MG capsule Take 40 mg by mouth daily.    ? ondansetron (ZOFRAN) 4 MG tablet TAKE 1 TABLET BY MOUTH EVERY 8 HOURS AS NEEDED FOR NAUSEA AND VOMITING (Patient taking differently: Take 4 mg by mouth every 8 (eight) hours as needed for nausea or  vomiting.) 20 tablet 0  ? ?No current facility-administered medications for this visit.  ? ? ? ?Musculoskeletal: ?Strength & Muscle Tone: na ?Gait & Station: na ?Patient leans: N/A ? ?Psychiatric Specialty

## 2021-04-11 ENCOUNTER — Encounter: Payer: Self-pay | Admitting: Urology

## 2021-04-11 ENCOUNTER — Ambulatory Visit (INDEPENDENT_AMBULATORY_CARE_PROVIDER_SITE_OTHER): Payer: Medicare Other | Admitting: Urology

## 2021-04-11 VITALS — BP 117/73 | HR 67

## 2021-04-11 DIAGNOSIS — N390 Urinary tract infection, site not specified: Secondary | ICD-10-CM

## 2021-04-11 DIAGNOSIS — N1330 Unspecified hydronephrosis: Secondary | ICD-10-CM

## 2021-04-11 DIAGNOSIS — R1032 Left lower quadrant pain: Secondary | ICD-10-CM

## 2021-04-11 DIAGNOSIS — E1169 Type 2 diabetes mellitus with other specified complication: Secondary | ICD-10-CM | POA: Diagnosis not present

## 2021-04-11 DIAGNOSIS — E782 Mixed hyperlipidemia: Secondary | ICD-10-CM | POA: Diagnosis not present

## 2021-04-11 DIAGNOSIS — E7800 Pure hypercholesterolemia, unspecified: Secondary | ICD-10-CM | POA: Diagnosis not present

## 2021-04-11 LAB — URINALYSIS, ROUTINE W REFLEX MICROSCOPIC
Bilirubin, UA: NEGATIVE
Leukocytes,UA: NEGATIVE
Nitrite, UA: POSITIVE — AB
Protein,UA: NEGATIVE
RBC, UA: NEGATIVE
Specific Gravity, UA: >1.03 — ABNORMAL HIGH (ref 1.005–1.030)
Urobilinogen, Ur: 1 mg/dL (ref 0.2–1.0)
pH, UA: 6 (ref 5.0–7.5)

## 2021-04-11 MED ORDER — SULFAMETHOXAZOLE-TRIMETHOPRIM 800-160 MG PO TABS: 1.0000 | ORAL_TABLET | Freq: Two times a day (BID) | ORAL | 0 refills | Status: DC

## 2021-04-11 NOTE — Patient Instructions (Signed)
Pyelonephritis, Adult Pyelonephritis is an infection that occurs in the kidney. The kidneys are the organs that filter a person's blood and move waste out of the bloodstream and into the urine. Urine passes from the kidneys, through tubes called ureters, and into the bladder. There are two main types of pyelonephritis: Infections that come on quickly without any warning (acute pyelonephritis). Infections that last for a long period of time (chronic pyelonephritis). In most cases, the infection clears up with treatment and does not cause further problems. More severe infections or chronic infections can sometimes spread to the bloodstream or lead to other problems with the kidneys. What are the causes? This condition is usually caused by: Bacteria traveling from the bladder up to the kidney. This may occur after having a bladder infection (cystitis) or urinary tract infection (UTI). Bladder infections caused from bacteria traveling from the bloodstream to the kidney. What increases the risk? This condition is more likely to develop in: Pregnant women. Older people. People who have any of these conditions: Diabetes. Inflammation of the prostate gland (prostatitis), in males. Kidney stones or bladder stones. Other abnormalities of the kidney or ureter. Cancer. People who have a catheter placed in the bladder. People who are sexually active. Women who use spermicides. People who have had a prior UTI. What are the signs or symptoms? Symptoms of this condition include: Frequent urination. Strong or persistent urge to urinate. Burning or stinging when urinating. Abdominal pain. Back pain. Pain in the side or flank area. Fever or chills. Blood in the urine, or dark urine. Nausea or vomiting. How is this diagnosed? This condition may be diagnosed based on: Your medical history and a physical exam. Urine tests. Blood tests. You may also have imaging tests of the kidneys, such as an  ultrasound or CT scan. How is this treated? Treatment for this condition may depend on the severity of the infection. If the infection is mild and is found early, you may be treated with antibiotic medicines taken by mouth (orally). You will need to drink fluids to remain hydrated. If the infection is more severe, you may need to stay in the hospital and receive antibiotics given directly into a vein through an IV. You may also need to receive fluids through an IV if you are not able to remain hydrated. After your hospital stay, you may need to take oral antibiotics for a period of time. Other treatments may be required, depending on the cause of the infection. Follow these instructions at home: Medicines Take your antibiotic medicine as told by your health care provider. Do not stop taking the antibiotic even if you start to feel better. Take over-the-counter and prescription medicines only as told by your health care provider. General instructions  Drink enough fluid to keep your urine pale yellow. Avoid caffeine, tea, and carbonated beverages. They tend to irritate the bladder. Urinate often. Avoid holding in urine for long periods of time. Urinate before and after sex. After a bowel movement, women should cleanse from front to back. Use each tissue only once. Keep all follow-up visits as told by your health care provider. This is important. Contact a health care provider if: Your symptoms do not get better after 2 days of treatment. Your symptoms get worse. You have a fever. Get help right away if you: Are unable to take your antibiotics or fluids. Have shaking chills. Vomit. Have severe flank or back pain. Have extreme weakness or fainting. Summary Pyelonephritis is a urinary tract infection (  UTI) that occurs in the kidney. ?Treatment for this condition may depend on the severity of the infection. ?Take your antibiotic medicine as told by your health care provider. Do not stop  taking the antibiotic even if you start to feel better. ?Drink enough fluid to keep your urine pale yellow. ?Keep all follow-up visits as told by your health care provider. This is important. ?This information is not intended to replace advice given to you by your health care provider. Make sure you discuss any questions you have with your health care provider. ?Document Revised: 08/08/2020 Document Reviewed: 08/08/2020 ?Elsevier Patient Education ? [place]. ? ?

## 2021-04-11 NOTE — Progress Notes (Signed)
? ?04/11/2021 ?9:01 AM  ? ?Andrea Dickson ?08/21/1950 ?[id number] ? ?Referring provider: Curlene Labrum, MD ?[address] ?[place],  [place] [address] ? ?Followup recurrent UTI ? ? ?HPI: ?[name] is a 71yo here for followup for recurrent UTI. Show the past month she has had new left lower quadrant abdominal pain which is sharp, intermittent, mild to moderate land radiates to her left flank. UA today is concerning for infection. CT from 03/05/2021 shows mild left renal fullness and concern for pyelonephrosis. No fevers. She has known cirrhosis.  ? ? ?PMH: ?Past Medical History:  ?Diagnosis Date  ? Anxiety   ? Asthma   ? Bipolar 1 disorder ([place])   ? Chronic diarrhea   ? Cirrhosis ([place])   ? Diagnosed September 2020, likely secondary to [facility] s/p Hep A/B vaccination in 2021.   ? Depression   ? Diabetes ([place])   ? Essential tremor   ? GERD (gastroesophageal reflux disease)   ? Headache   ? High cholesterol   ? ? ?Surgical History: ?Past Surgical History:  ?Procedure Laterality Date  ? ABDOMINAL HYSTERECTOMY  1997  ? BIOPSY  01/17/2018  ? Procedure: BIOPSY;  Surgeon: Daneil Dolin, MD;  [facility];  Service: Endoscopy;;  colon  ? carpal tunnel Bilateral 1999, 06/2009  ? COLONOSCOPY  2013  ? Dr. Britta Mccreedy: no colon polyps. quality of prep was fair. repeat colonoscopy in five years.   ? COLONOSCOPY WITH PROPOFOL N/A 01/17/2018  ? Dr. Gala Romney: Colonic lipoma, 2 tubular adenomas removed.  Random colon biopsies negative.  Next colonoscopy 5 years.  ? ESOPHAGOGASTRODUODENOSCOPY (EGD) WITH PROPOFOL N/A 04/06/2019  ? Procedure: ESOPHAGOGASTRODUODENOSCOPY (EGD) WITH PROPOFOL;  Surgeon: Daneil Dolin, MD;   Normal esophagus, mild portal hypertensive gastropathy, otherwise normal exam.  Recommend repeat EGD in 2 years for variceal screening.   ? POLYPECTOMY  01/17/2018  ? Procedure: POLYPECTOMY;  Surgeon: Daneil Dolin, MD;  [facility];  Service: Endoscopy;;  colon  ? SKIN CANCER EXCISION  2007  ? TONSILLECTOMY   1965  ? ulner nerve  Left 06/2009  ? decompression  ? ? ?Home Medications:  ?Allergies as of 04/11/2021   ? ?   Reactions  ? Pramipexole Other (See Comments)  ? Shaking, palpitations, headache, faint feeling  ? Crestor [rosuvastatin Calcium]   ? Muscle pain  ? Pollen Extract Other (See Comments)  ? Eyes and nose run  ? ?  ? ?  ?Medication List  ?  ? ?  ? Accurate as of April 11, 2021  9:01 AM. If you have any questions, ask your nurse or doctor.  ?  ?  ? ?  ? ?acetaminophen 500 MG tablet ?Commonly known as: TYLENOL ?Take 1,000 mg by mouth 2 (two) times daily as needed for moderate pain or headache. ?  ?buPROPion 150 MG 24 hr tablet ?Commonly known as: WELLBUTRIN XL ?Take 1 tablet (150 mg total) by mouth every morning. ?  ?cetirizine 10 MG tablet ?Commonly known as: ZYRTEC ?Take 10 mg by mouth daily. ?  ?diazepam 2 MG tablet ?Commonly known as: VALIUM ?TAKE ONE TABLET BY MOUTH TWO times daily AS NEEDED FOR ANXIETY ?  ?diazepam 10 MG tablet ?Commonly known as: Valium ?Take 1 tablet (10 mg total) by mouth at bedtime. ?  ?dicyclomine 10 MG capsule ?Commonly known as: BENTYL ?Take 10 mg by mouth every 6 (six) hours as needed. ?  ?estrogens (conjugated) 1.25 MG tablet ?Commonly known as: PREMARIN ?  Take 1.25 mg by mouth daily. ?  ?ezetimibe 10 MG tablet ?Commonly known as: ZETIA ?Take 10 mg by mouth daily. ?  ?IGlucose Test Strips test strip ?Generic drug: glucose blood ?daily. use as directed ?  ?loperamide 2 MG capsule ?Commonly known as: IMODIUM ?Take 2 mg by mouth as needed for diarrhea or loose stools. ?  ?methocarbamol 500 MG tablet ?Commonly known as: ROBAXIN ?Take 500 mg by mouth daily as needed for muscle spasms. ?  ?nitrofurantoin 50 MG capsule ?Commonly known as: MACRODANTIN ?TAKE ONE CAPSULE BY MOUTH EVERYDAY AT BEDTIME ?  ?omeprazole 40 MG capsule ?Commonly known as: PRILOSEC ?Take 40 mg by mouth daily. ?  ?ondansetron 4 MG tablet ?Commonly known as: ZOFRAN ?TAKE 1 TABLET BY MOUTH EVERY 8 HOURS AS NEEDED FOR  NAUSEA AND VOMITING ?What changed:  ?how much to take ?how to take this ?when to take this ?reasons to take this ?additional instructions ?  ?propranolol ER 80 MG 24 hr capsule ?Commonly known as: Inderal LA ?Take 1 capsule (80 mg total) by mouth daily. ?  ?Vitamin D3 50 MCG (2000 UT) Tabs ?Take 2,000 Units by mouth daily. ?  ? ?  ? ? ?Allergies:  ?Allergies  ?Allergen Reactions  ? Pramipexole Other (See Comments)  ?  Shaking, palpitations, headache, faint feeling  ? Crestor [Rosuvastatin Calcium]   ?  Muscle pain  ? Pollen Extract Other (See Comments)  ?  Eyes and nose run  ? ? ?Family History: ?Family History  ?Problem Relation Age of Onset  ? Diabetes Mother   ? Brain cancer Mother   ? Diabetes Father   ? Heart attack Father   ? Heart disease Father   ? Diabetes Sister   ? Bipolar disorder Brother   ? Diabetes Brother   ? Heart disease Brother   ? Colon cancer Neg Hx   ? Breast cancer Neg Hx   ? ? ?Social History:  reports that she has been smoking cigarettes. She has a 21.50 pack-year smoking history. She has never used smokeless tobacco. She reports that she does not drink alcohol and does not use drugs. ? ?ROS: ?All other review of systems were reviewed and are negative except what is noted above in HPI ? ?Physical Exam: ?BP 117/73   Pulse 67   ?Constitutional:  Alert and oriented, No acute distress. ?HEENT:  AT, moist mucus membranes.  Trachea midline, no masses. ?Cardiovascular: No clubbing, cyanosis, or edema. ?Respiratory: Normal respiratory effort, no increased work of breathing. ?GI: Abdomen is soft, nontender, nondistended, no abdominal masses ?GU: No CVA tenderness.  ?Lymph: No cervical or inguinal lymphadenopathy. ?Skin: No rashes, bruises or suspicious lesions. ?Neurologic: Grossly intact, no focal deficits, moving all 4 extremities. ?Psychiatric: Normal mood and affect. ? ?Laboratory Data: ?Lab Results  ?Component Value Date  ? WBC 2.9 (L) 03/18/2021  ? HGB 11.4 (L) 03/18/2021  ? HCT 34.3 (L)  03/18/2021  ? MCV 82.1 03/18/2021  ? PLT 63 (L) 03/18/2021  ? ? ?Lab Results  ?Component Value Date  ? CREATININE 0.90 03/18/2021  ? ? ?No results found for: PSA ? ?No results found for: TESTOSTERONE ? ?Lab Results  ?Component Value Date  ? HGBA1C 5.4 04/24/2015  ? ? ?Urinalysis ?   ?Component Value Date/Time  ? APPEARANCEUR Clear 10/23/2019 1319  ? GLUCOSEU Negative 10/23/2019 1319  ? BILIRUBINUR Negative 10/23/2019 1319  ? PROTEINUR 1+ (A) 10/23/2019 1319  ? UROBILINOGEN 1.0 10/08/2015 1347  ? NITRITE Negative 10/23/2019 1319  ?  LEUKOCYTESUR Negative 10/23/2019 1319  ? ? ?Lab Results  ?Component Value Date  ? LABMICR See below: 10/23/2019  ? WBCUA 0-5 10/23/2019  ? LABEPIT None seen 10/23/2019  ? BACTERIA Few 10/23/2019  ? ? ?Pertinent Imaging: ?CT 03/05/2021: Images reviewed and discussed with the patient ?No results found for this or any previous visit. ? ?No results found for this or any previous visit. ? ?No results found for this or any previous visit. ? ?No results found for this or any previous visit. ? ?No results found for this or any previous visit. ? ?No results found for this or any previous visit. ? ?No results found for this or any previous visit. ? ?No results found for this or any previous visit. ? ? ?Assessment & Plan:   ? ?1. LLQ abdominal pain ?-Likely related to UTI ?- Urinalysis, Routine w reflex microscopic ? ?2. Recurrent UTI ?-Urine for culture, will call with results ?-Bactrim DS BID for 7 days ? ? ?No follow-ups on file. ? ?Nicolette Bang, MD ? ?[facility] ?  ?

## 2021-04-14 LAB — URINE CULTURE

## 2021-04-15 ENCOUNTER — Telehealth: Payer: Self-pay

## 2021-04-15 MED ORDER — NITROFURANTOIN MONOHYD MACRO 100 MG PO CAPS: 100.0000 mg | ORAL_CAPSULE | Freq: Two times a day (BID) | ORAL | 0 refills | Status: DC

## 2021-04-15 NOTE — Telephone Encounter (Signed)
-----   Message from Cleon Gustin, MD sent at 04/14/2021  2:31 PM EDT ----- ?Macrobid '100mg'$  BID for 7 days ?----- Message ----- ?From: Iris Pert, LPN ?Sent: 04/14/2021  12:22 PM EDT ?To: Cleon Gustin, MD ? ?Please review ?Patient started on Bactrim ? ?

## 2021-04-15 NOTE — Telephone Encounter (Signed)
Rx sent  patient called and made aware. ?

## 2021-04-25 DIAGNOSIS — E7849 Other hyperlipidemia: Secondary | ICD-10-CM | POA: Diagnosis not present

## 2021-04-25 DIAGNOSIS — F17210 Nicotine dependence, cigarettes, uncomplicated: Secondary | ICD-10-CM | POA: Diagnosis not present

## 2021-04-25 DIAGNOSIS — N1831 Chronic kidney disease, stage 3a: Secondary | ICD-10-CM | POA: Diagnosis not present

## 2021-04-25 DIAGNOSIS — E1169 Type 2 diabetes mellitus with other specified complication: Secondary | ICD-10-CM | POA: Diagnosis not present

## 2021-04-25 DIAGNOSIS — E559 Vitamin D deficiency, unspecified: Secondary | ICD-10-CM | POA: Diagnosis not present

## 2021-04-25 DIAGNOSIS — R7989 Other specified abnormal findings of blood chemistry: Secondary | ICD-10-CM | POA: Diagnosis not present

## 2021-04-25 DIAGNOSIS — E7800 Pure hypercholesterolemia, unspecified: Secondary | ICD-10-CM | POA: Diagnosis not present

## 2021-04-25 DIAGNOSIS — I10 Essential (primary) hypertension: Secondary | ICD-10-CM | POA: Diagnosis not present

## 2021-04-25 DIAGNOSIS — E1165 Type 2 diabetes mellitus with hyperglycemia: Secondary | ICD-10-CM | POA: Diagnosis not present

## 2021-04-29 DIAGNOSIS — K7460 Unspecified cirrhosis of liver: Secondary | ICD-10-CM | POA: Diagnosis not present

## 2021-04-29 DIAGNOSIS — N1831 Chronic kidney disease, stage 3a: Secondary | ICD-10-CM | POA: Diagnosis not present

## 2021-04-29 DIAGNOSIS — N390 Urinary tract infection, site not specified: Secondary | ICD-10-CM | POA: Diagnosis not present

## 2021-04-29 DIAGNOSIS — E1169 Type 2 diabetes mellitus with other specified complication: Secondary | ICD-10-CM | POA: Diagnosis not present

## 2021-04-29 DIAGNOSIS — E7849 Other hyperlipidemia: Secondary | ICD-10-CM | POA: Diagnosis not present

## 2021-04-29 DIAGNOSIS — F17210 Nicotine dependence, cigarettes, uncomplicated: Secondary | ICD-10-CM | POA: Diagnosis not present

## 2021-04-29 DIAGNOSIS — Z23 Encounter for immunization: Secondary | ICD-10-CM | POA: Diagnosis not present

## 2021-04-29 DIAGNOSIS — Z0001 Encounter for general adult medical examination with abnormal findings: Secondary | ICD-10-CM | POA: Diagnosis not present

## 2021-05-01 ENCOUNTER — Ambulatory Visit (HOSPITAL_COMMUNITY)
Admission: RE | Admit: 2021-05-01 | Discharge: 2021-05-01 | Disposition: A | Discharge status: Home / Self Care | Payer: Medicare Other | Source: Ambulatory Visit | Attending: Urology | Admitting: Urology

## 2021-05-01 DIAGNOSIS — N1330 Unspecified hydronephrosis: Secondary | ICD-10-CM

## 2021-05-01 DIAGNOSIS — K7460 Unspecified cirrhosis of liver: Secondary | ICD-10-CM | POA: Diagnosis not present

## 2021-05-05 ENCOUNTER — Telehealth (HOSPITAL_COMMUNITY): Payer: Self-pay | Admitting: *Deleted

## 2021-05-05 NOTE — Telephone Encounter (Signed)
Dr. Pleas Koch office called needing advise from provider about patient medication. Office aware provider is out of the office and is fine with sending message until provider returns to office.  ? ?Per Angela Nevin from Dr. Pleas Koch office, provider stated that pt was interested in stop smoking and he was wanting to put patient on Chantix. Per Dr. Pleas Koch, he see that patient is on Buspar and chantix may cause some interactions and he needs advise as to adjusting patient Buspar dose.  ?

## 2021-05-08 ENCOUNTER — Ambulatory Visit (INDEPENDENT_AMBULATORY_CARE_PROVIDER_SITE_OTHER): Payer: Medicare Other | Admitting: Physician Assistant

## 2021-05-08 VITALS — BP 117/67 | HR 76 | Ht 66.0 in | Wt 160.0 lb

## 2021-05-08 DIAGNOSIS — N390 Urinary tract infection, site not specified: Secondary | ICD-10-CM | POA: Diagnosis not present

## 2021-05-08 DIAGNOSIS — B3749 Other urogenital candidiasis: Secondary | ICD-10-CM

## 2021-05-08 DIAGNOSIS — R109 Unspecified abdominal pain: Secondary | ICD-10-CM | POA: Diagnosis not present

## 2021-05-08 DIAGNOSIS — N898 Other specified noninflammatory disorders of vagina: Secondary | ICD-10-CM | POA: Diagnosis not present

## 2021-05-08 DIAGNOSIS — N3000 Acute cystitis without hematuria: Secondary | ICD-10-CM | POA: Diagnosis not present

## 2021-05-08 DIAGNOSIS — B379 Candidiasis, unspecified: Secondary | ICD-10-CM

## 2021-05-08 LAB — BLADDER SCAN AMB NON-IMAGING: Scan Result: 0

## 2021-05-08 MED ORDER — FLUCONAZOLE 100 MG PO TABS: 100.0000 mg | ORAL_TABLET | Freq: Every day | ORAL | 0 refills | Status: AC

## 2021-05-08 MED ORDER — NITROFURANTOIN MONOHYD MACRO 100 MG PO CAPS: 100.0000 mg | ORAL_CAPSULE | Freq: Two times a day (BID) | ORAL | 0 refills | Status: DC

## 2021-05-08 NOTE — Progress Notes (Signed)
post void residual =0mL 

## 2021-05-08 NOTE — Progress Notes (Signed)
? ?Assessment: ?1. Recurrent UTI ?- Urinalysis, Routine w reflex microscopic ?- Urine Culture ?- BLADDER SCAN AMB NON-IMAGING ? ?2. Acute cystitis without hematuria ?- Urine Culture ? ?3. Flank pain ? ?4. Yeast detected ?  ? ?Plan: ?Diflucan and Macrobid Rx. Urine sent for cx. Will consider resuming HS antibx pending cx result. Pt advised that flank pain likely from non-urologic source and recommend FU with PCP for further eval. No CVAT or findings to suggest pyelonephritis. Consider cysto in the future if urinary sxs/UTIs persist ? ?Chief Complaint: ?UA ? ?HPI: ?Andrea Dickson is a 71 y.o. female who presents for continued evaluation of recurrent UTIs.  She was last seen 3 weeks ago and urine culture grew Enterococcus faecalis.  Resistant seen with doxycycline.  She was treated with Macrobid twice daily for 7 days.  Patient has been on Macrodantin at bedtime in the past for prevention, but states she stopped this a few months ago.  Today, she continues to complain of left flank pain which sometimes radiates across her entire abdomen and back.  Nothing makes it worse, Tylenol is mildly helpful.  She denies urinary incontinence, no gross hematuria, no urgency, frequency.  She does have occasional discharge noted in her underwear, but denies vaginal itching.  Recent renal ultrasound indicates no evidence of hydronephrosis or pyelonephritis.  No stone presence appreciated on ultrasound or previous CT on 03/05/2021. ?PVR=46m ?UA= ?6-10WBCs, few bacteria, nitrite negative ? ?04/11/21 ?[name] a 89yohere for followup for recurrent UTI. Show the past month she has had new left lower quadrant abdominal pain which is sharp, intermittent, mild to moderate land radiates to her left flank. UA today is concerning for infection. CT from 03/05/2021 shows mild left renal fullness and concern for pyelonephrosis. No fevers. She has known cirrhosis.  ? ?01/22/21 ?Ms WMikelsis a 89yohere for followup for recurrent UTI. She  is currently on macrobid '50mg'$  qhs. She had 1 UTI since last visit which was treated with cipro. She denies any LUTS. No dysuria or hematuria. No worsening incontinence. ? ?Portions of the above documentation were copied from a prior visit for review purposes only. ? ?Allergies: ?Allergies  ?Allergen Reactions  ? Pramipexole Other (See Comments)  ?  Shaking, palpitations, headache, faint feeling  ? Crestor [Rosuvastatin Calcium]   ?  Muscle pain  ? Pollen Extract Other (See Comments)  ?  Eyes and nose run  ? ? ?PMH: ?Past Medical History:  ?Diagnosis Date  ? Anxiety   ? Asthma   ? Bipolar 1 disorder ([place]   ? Chronic diarrhea   ? Cirrhosis ([place]   ? [facility] September 2020, [facility].   ? Depression   ? Diabetes ([place]   ? Essential tremor   ? GERD (gastroesophageal reflux disease)   ? Headache   ? High cholesterol   ? ? ?PSH: ?Past Surgical History:  ?Procedure Laterality Date  ? ABDOMINAL HYSTERECTOMY  1997  ? BIOPSY  01/17/2018  ? Procedure: BIOPSY;  Surgeon: RDaneil Dolin MD;  [facility];  Service: Endoscopy;;  colon  ? carpal tunnel Bilateral 1999, 06/2009  ? COLONOSCOPY  2013  ? Dr. BBritta Mccreedy no colon polyps. quality of prep was fair. repeat colonoscopy in five years.   ? COLONOSCOPY WITH PROPOFOL N/A 01/17/2018  ? Dr. RGala Romney Colonic lipoma, 2 tubular adenomas removed.  Random colon biopsies negative.  Next colonoscopy 5 years.  ? ESOPHAGOGASTRODUODENOSCOPY (EGD) WITH  PROPOFOL N/A 04/06/2019  ? Procedure: ESOPHAGOGASTRODUODENOSCOPY (EGD) WITH PROPOFOL;  Surgeon: Daneil Dolin, MD;   Normal esophagus, mild portal hypertensive gastropathy, otherwise normal exam.  Recommend repeat EGD in 2 years for variceal screening.   ? POLYPECTOMY  01/17/2018  ? Procedure: POLYPECTOMY;  Surgeon: Daneil Dolin, MD;  [facility];  Service: Endoscopy;;  colon  ? SKIN CANCER EXCISION  2007  ? TONSILLECTOMY  1965  ? ulner nerve  Left 06/2009  ? decompression   ? ? ?SH: ?Social History  ? ?Tobacco Use  ? Smoking status: Every Day  ?  Packs/day: 0.50  ?  Years: 43.00  ?  Pack years: 21.50  ?  Types: Cigarettes  ? Smokeless tobacco: Never  ? Tobacco comments:  ?  smoking since 71yr old.    ?Vaping Use  ? Vaping Use: Never used  ?Substance Use Topics  ? Alcohol use: No  ?  Alcohol/week: 0.0 standard drinks  ? Drug use: No  ? ? ?ROS: ?Constitutional:  Negative for fever, chills, weight loss ?CV: Negative for chest pain ?Respiratory:  Negative for shortness of breath, wheezing, sleep apnea, frequent cough ?GI:  Negative for nausea, vomiting, bloody stool, GERD ? ?PE: ?BP 117/67   Pulse 76   Ht '5\' 6"'$  (1.676 m)   Wt 160 lb (72.6 kg)   BMI 25.82 kg/m?  ?GENERAL APPEARANCE:  Well appearing, well developed, well nourished, NAD ?HEENT:  Atraumatic, normocephalic ?NECK:  Supple. Trachea midline ?ABDOMEN:  Soft, non-tender, no masses ?EXTREMITIES:  Moves all extremities well, without clubbing, cyanosis, or edema ?NEUROLOGIC:  Alert and oriented x 3, normal gait, CN II-XII grossly intact ?MENTAL STATUS:  appropriate ?BACK:  Non-tender to palpation, No CVAT ?SKIN:  Warm, dry, and intact ? ? ?Results: ?Laboratory Data: ?Lab Results  ?Component Value Date  ? WBC 2.9 (L) 03/18/2021  ? HGB 11.4 (L) 03/18/2021  ? HCT 34.3 (L) 03/18/2021  ? MCV 82.1 03/18/2021  ? PLT 63 (L) 03/18/2021  ? ? ?Lab Results  ?Component Value Date  ? CREATININE 0.90 03/18/2021  ? ? ?Lab Results  ?Component Value Date  ? HGBA1C 5.4 04/24/2015  ? ? ?Urinalysis ?   ?Component Value Date/Time  ? APPEARANCEUR Hazy (A) 04/11/2021 1043  ? GLUCOSEU Trace (A) 04/11/2021 1043  ? BILIRUBINUR Negative 04/11/2021 1043  ? PROTEINUR Negative 04/11/2021 1043  ? UROBILINOGEN 1.0 10/08/2015 1347  ? NITRITE Positive (A) 04/11/2021 1043  ? LEUKOCYTESUR Negative 04/11/2021 1043  ? ? ?Lab Results  ?Component Value Date  ? LABMICR Comment 04/11/2021  ? WBCUA 0-5 10/23/2019  ? LABEPIT None seen 10/23/2019  ? BACTERIA Few 10/23/2019   ? ? ?Pertinent Imaging: ? ?No results found for this or any previous visit. ? ?No results found for this or any previous visit. ? ?No results found for this or any previous visit. ? ?No results found for this or any previous visit. ? ?Results for orders placed during the hospital encounter of 05/01/21 ? ?Ultrasound renal complete ? ?Narrative ?CLINICAL DATA:  left hydronephrosis ? ?EXAM: ?RENAL / URINARY TRACT ULTRASOUND COMPLETE ? ?COMPARISON:  March 05, 2021 abdominal CT ? ?FINDINGS: ?Right Kidney: ? ?Renal measurements: 10.2 x 4.5 x 5.2 cm = volume: 125 mL. ?Echogenicity within normal limits. No mass or hydronephrosis ?visualized. ? ?Left Kidney: ? ?Renal measurements: 11.0 x 4.3 x 5.1 cm = volume: 120 thick mL. ?Echogenicity within normal limits. No mass or hydronephrosis ?visualized. ? ?Bladder: ? ?Appears normal for degree of  bladder distention. ? ?Other: ? ?Increased echogenicity of the liver with a nodular contour ?consistent with history of cirrhosis. Mildly prominent proximal ?duodenal walls on still frame image 23/24. ? ?IMPRESSION: ?1. No hydronephrosis. ?2. Cirrhotic liver morphology. ? ? ?Electronically Signed ?By: Valentino Saxon M.D. ?On: 05/02/2021 16:34 ? ?No results found for this or any previous visit. ? ?No results found for this or any previous visit. ? ?No results found for this or any previous visit. ? ?No results found for this or any previous visit (from the past 24 hour(s)).  ?

## 2021-05-09 LAB — URINALYSIS, ROUTINE W REFLEX MICROSCOPIC
Bilirubin, UA: NEGATIVE
Glucose, UA: NEGATIVE
Ketones, UA: NEGATIVE
Nitrite, UA: NEGATIVE
Protein,UA: NEGATIVE
RBC, UA: NEGATIVE
Specific Gravity, UA: 1.015 (ref 1.005–1.030)
Urobilinogen, Ur: 2 mg/dL — ABNORMAL HIGH (ref 0.2–1.0)
pH, UA: 6 (ref 5.0–7.5)

## 2021-05-09 LAB — MICROSCOPIC EXAMINATION: Renal Epithel, UA: NONE SEEN /hpf

## 2021-05-11 DIAGNOSIS — E7800 Pure hypercholesterolemia, unspecified: Secondary | ICD-10-CM | POA: Diagnosis not present

## 2021-05-11 DIAGNOSIS — K219 Gastro-esophageal reflux disease without esophagitis: Secondary | ICD-10-CM | POA: Diagnosis not present

## 2021-05-11 DIAGNOSIS — E782 Mixed hyperlipidemia: Secondary | ICD-10-CM | POA: Diagnosis not present

## 2021-05-11 DIAGNOSIS — E1169 Type 2 diabetes mellitus with other specified complication: Secondary | ICD-10-CM | POA: Diagnosis not present

## 2021-05-11 NOTE — Telephone Encounter (Signed)
She is not on Buspar, she is on Buproprion (Welllbutrin). Tell her its ok to combine Wellbutrin and Chantix but it may cause some anxiety/insomnia,. However she is not on a high dose of Wellbutrin

## 2021-05-12 LAB — URINE CULTURE

## 2021-05-14 ENCOUNTER — Telehealth: Payer: Self-pay

## 2021-05-14 NOTE — Telephone Encounter (Signed)
-----   Message from Reynaldo Minium, [place] sent at 05/14/2021 10:27 AM EDT ----- ?Please let pt know that her cx indicates the antibx and diflucan she was Rxed will cover the infection.  ?----- Message ----- ?From: Interface, [facility] Lab Results In ?Sent: 05/09/2021   9:56 AM EDT ?To: Berneice Heinrich [place], PA-C ? ? ?

## 2021-05-14 NOTE — Telephone Encounter (Signed)
Patient called and notified of culture results. Voiced understanding to continue medications.  ?

## 2021-05-15 ENCOUNTER — Other Ambulatory Visit (HOSPITAL_COMMUNITY): Payer: Self-pay | Admitting: Psychiatry

## 2021-05-23 NOTE — Telephone Encounter (Signed)
Message was routed to Dr. Pleas Koch office via [place].  ?

## 2021-05-27 ENCOUNTER — Telehealth: Payer: Self-pay

## 2021-05-27 NOTE — Telephone Encounter (Signed)
She is having vaginal itching and burning with urination. Verbal from [place] to schedule apt for a UA.  Patient aware. ?

## 2021-05-27 NOTE — Telephone Encounter (Signed)
Patient is taking the two medications rx'd but she is itching and wants to know if you can send her in something to stop the itching.  Please advise.  ?

## 2021-05-28 ENCOUNTER — Ambulatory Visit (INDEPENDENT_AMBULATORY_CARE_PROVIDER_SITE_OTHER): Payer: Medicare Other | Admitting: Physician Assistant

## 2021-05-28 VITALS — BP 133/68 | HR 74 | Ht 66.0 in | Wt 160.0 lb

## 2021-05-28 DIAGNOSIS — R300 Dysuria: Secondary | ICD-10-CM

## 2021-05-28 DIAGNOSIS — N760 Acute vaginitis: Secondary | ICD-10-CM

## 2021-05-28 LAB — URINALYSIS, ROUTINE W REFLEX MICROSCOPIC
Bilirubin, UA: NEGATIVE
Glucose, UA: NEGATIVE
Leukocytes,UA: NEGATIVE
Nitrite, UA: NEGATIVE
Protein,UA: NEGATIVE
RBC, UA: NEGATIVE
Specific Gravity, UA: >1.03 — ABNORMAL HIGH (ref 1.005–1.030)
Urobilinogen, Ur: 2 mg/dL — ABNORMAL HIGH (ref 0.2–1.0)
pH, UA: 5.5 (ref 5.0–7.5)

## 2021-05-28 LAB — BLADDER SCAN AMB NON-IMAGING: Scan Result: 45

## 2021-05-28 MED ORDER — MONISTAT 3 COMBO PACK APP 200 & 2 MG-% (9GM) VA KIT: 1.0000 | PACK | Freq: Every day | VAGINAL | 0 refills | Status: AC

## 2021-05-28 NOTE — Progress Notes (Signed)
Assessment: 1. Burning with urination - Urinalysis, Routine w reflex microscopic - BLADDER SCAN AMB NON-IMAGING  2. Acute vaginitis    Plan: Monistat Rx sent to the pt pharmacy and she is reassured that her urine is clear. Will keep FU appt as scheduled next month for UA and PVR  Chief Complaint: No chief complaint on file.   HPI: Andrea Dickson is a 71 y.o. female with a history of recurrent UTIs who presents for evaluation of urgency, difficulty starting stream, and burning since she completed the diflucan last visit. She also c/o vaginal itching nad discharge. No odor. No fever, chills, gross heamturia. Pt has had nausea. Chronic diarrhea is still a concern. Meds for this cause intermittent constipation.. Urine culture on 05/08/2021 grew Candida tropicalis, greater than 100,000 colonies.  The patient was treated with a course of Diflucan.  Culture on 04/11/2021 was positive for Enterococcus faecalis, resistant to tetracycline. Urine clear PVR=93m  05/08/21 PNATASSIA GUTHRIDGEis a 71y.o. female who presents for continued evaluation of recurrent UTIs.  She was last seen 3 weeks ago and urine culture grew Enterococcus faecalis.  Resistant seen with doxycycline.  She was treated with Macrobid twice daily for 7 days.  Patient has been on Macrodantin at bedtime in the past for prevention, but states she stopped this a few months ago.  Today, she continues to complain of left flank pain which sometimes radiates across her entire abdomen and back.  Nothing makes it worse, Tylenol is mildly helpful.  She denies urinary incontinence, no gross hematuria, no urgency, frequency.  She does have occasional discharge noted in her underwear, but denies vaginal itching.  Recent renal ultrasound indicates no evidence of hydronephrosis or pyelonephritis.  No stone presence appreciated on ultrasound or previous CT on 03/05/2021. PVR=078mUA= 6-10WBCs, few bacteria, nitrite negative  04/11/21 [name] a [id number] for followup for recurrent UTI. Show the past month she has had new left lower quadrant abdominal pain which is sharp, intermittent, mild to moderate land radiates to her left flank. UA today is concerning for infection. CT from 03/05/2021 shows mild left renal fullness and concern for pyelonephrosis. No fevers. She has known cirrhosis.  Portions of the above documentation were copied from a prior visit for review purposes only.  Allergies: Allergies  Allergen Reactions   Pramipexole Other (See Comments)    Shaking, palpitations, headache, faint feeling   Crestor [Rosuvastatin Calcium]     Muscle pain   Pollen Extract Other (See Comments)    Eyes and nose run    PMH: Past Medical History:  Diagnosis Date   Anxiety    Asthma    Bipolar 1 disorder ([facility]   Chronic diarrhea    Cirrhosis ([facility]   Diagnosed September 2020, [facility] in 2021.    Depression    Diabetes ([place]   Essential tremor    GERD (gastroesophageal reflux disease)    Headache    High cholesterol     PSH: Past Surgical History:  Procedure Laterality Date   ABDOMINAL HYSTERECTOMY  1997   BIOPSY  01/17/2018   Procedure: BIOPSY;  Surgeon: RoDaneil DolinMD;  [facility];  Service: Endoscopy;;  colon   carpal tunnel Bilateral 1999, 06/2009   COLONOSCOPY  2013   Dr. BeBritta Mccreedyno colon polyps. quality of prep was fair. repeat colonoscopy in five years.    COLONOSCOPY WITH PROPOFOL N/A 01/17/2018  Dr. Gala Romney: Colonic lipoma, 2 tubular adenomas removed.  Random colon biopsies negative.  Next colonoscopy 5 years.   ESOPHAGOGASTRODUODENOSCOPY (EGD) WITH PROPOFOL N/A 04/06/2019   Procedure: ESOPHAGOGASTRODUODENOSCOPY (EGD) WITH PROPOFOL;  Surgeon: Daneil Dolin, MD;   Normal esophagus, mild portal hypertensive gastropathy, otherwise normal exam.  Recommend repeat EGD in 2 years for variceal screening.    POLYPECTOMY  01/17/2018   Procedure:  POLYPECTOMY;  Surgeon: Daneil Dolin, MD;  [facility];  Service: Endoscopy;;  colon   SKIN CANCER EXCISION  2007   [place]   ulner nerve  Left 06/2009   decompression    SH: Social History   Tobacco Use   Smoking status: Every Day    Packs/day: 0.50    Years: 43.00    Pack years: 21.50    Types: Cigarettes   Smokeless tobacco: Never   Tobacco comments:    smoking since 71yr old.    Vaping Use   Vaping Use: Never used  Substance Use Topics   Alcohol use: No    Alcohol/week: 0.0 standard drinks   Drug use: No    ROS: See HPI  PE: BP 133/68   Pulse 74   Ht '5\' 6"'$  (1.676 m)   Wt 160 lb (72.6 kg)   BMI 25.82 kg/m  GENERAL APPEARANCE:  Well appearing, well developed, well nourished, NAD HEENT:  Atraumatic, normocephalic NECK:  Supple. Trachea midline ABDOMEN:  Soft, non-tender, no masses EXTREMITIES:  Moves all extremities well  NEUROLOGIC:  Alert and oriented x 3, normal gait MENTAL STATUS:  appropriate BACK:  Non-tender to palpation, No CVAT SKIN:  Warm, dry, and intact   Results: Laboratory Data: Lab Results  Component Value Date   WBC 2.9 (L) 03/18/2021   HGB 11.4 (L) 03/18/2021   HCT 34.3 (L) 03/18/2021   MCV 82.1 03/18/2021   PLT 63 (L) 03/18/2021    Lab Results  Component Value Date   CREATININE 0.90 03/18/2021    Lab Results  Component Value Date   HGBA1C 5.4 04/24/2015    Urinalysis    Component Value Date/Time   APPEARANCEUR Clear 05/08/2021 1414   GLUCOSEU Negative 05/08/2021 1414   BILIRUBINUR Negative 05/08/2021 1414   PROTEINUR Negative 05/08/2021 1414   UROBILINOGEN 1.0 10/08/2015 1347   NITRITE Negative 05/08/2021 1414   LEUKOCYTESUR 1+ (A) 05/08/2021 1414    Lab Results  Component Value Date   LABMICR See below: 05/08/2021   WBCUA 6-10 (A) 05/08/2021   LABEPIT 0-10 05/08/2021   BACTERIA Few 05/08/2021    Pertinent Imaging:  No results found for this or any previous visit.  No results  found for this or any previous visit.  No results found for this or any previous visit.  No results found for this or any previous visit.  Results for orders placed during the hospital encounter of 05/01/21  Ultrasound renal complete  Narrative CLINICAL DATA:  left hydronephrosis  EXAM: RENAL / URINARY TRACT ULTRASOUND COMPLETE  COMPARISON:  March 05, 2021 abdominal CT  FINDINGS: Right Kidney:  Renal measurements: 10.2 x 4.5 x 5.2 cm = volume: 125 mL. Echogenicity within normal limits. No mass or hydronephrosis visualized.  Left Kidney:  Renal measurements: 11.0 x 4.3 x 5.1 cm = volume: 120 thick mL. Echogenicity within normal limits. No mass or hydronephrosis visualized.  Bladder:  Appears normal for degree of bladder distention.  Other:  Increased echogenicity of the liver with a nodular contour consistent with history of  cirrhosis. Mildly prominent proximal duodenal walls on still frame image 23/24.  IMPRESSION: 1. No hydronephrosis. 2. Cirrhotic liver morphology.   Electronically Signed By: Valentino Saxon M.D. On: 05/02/2021 16:34  No results found for this or any previous visit.   No results found for this or any previous visit (from the past 24 hour(s)).

## 2021-05-28 NOTE — Progress Notes (Signed)
post void residual =49m

## 2021-05-29 ENCOUNTER — Encounter: Payer: Self-pay | Admitting: Physician Assistant

## 2021-06-11 ENCOUNTER — Other Ambulatory Visit (HOSPITAL_COMMUNITY): Payer: Self-pay | Admitting: Psychiatry

## 2021-06-11 DIAGNOSIS — E7800 Pure hypercholesterolemia, unspecified: Secondary | ICD-10-CM | POA: Diagnosis not present

## 2021-06-11 DIAGNOSIS — K219 Gastro-esophageal reflux disease without esophagitis: Secondary | ICD-10-CM | POA: Diagnosis not present

## 2021-06-11 DIAGNOSIS — E1169 Type 2 diabetes mellitus with other specified complication: Secondary | ICD-10-CM | POA: Diagnosis not present

## 2021-06-11 DIAGNOSIS — E782 Mixed hyperlipidemia: Secondary | ICD-10-CM | POA: Diagnosis not present

## 2021-06-18 ENCOUNTER — Ambulatory Visit (INDEPENDENT_AMBULATORY_CARE_PROVIDER_SITE_OTHER): Payer: Medicare Other | Admitting: Physician Assistant

## 2021-06-18 VITALS — BP 124/72 | HR 76 | Ht 66.0 in | Wt 160.0 lb

## 2021-06-18 DIAGNOSIS — N390 Urinary tract infection, site not specified: Secondary | ICD-10-CM

## 2021-06-18 DIAGNOSIS — Z87440 Personal history of urinary (tract) infections: Secondary | ICD-10-CM | POA: Diagnosis not present

## 2021-06-18 DIAGNOSIS — R3915 Urgency of urination: Secondary | ICD-10-CM

## 2021-06-18 DIAGNOSIS — R3912 Poor urinary stream: Secondary | ICD-10-CM | POA: Diagnosis not present

## 2021-06-18 LAB — URINALYSIS, ROUTINE W REFLEX MICROSCOPIC
Bilirubin, UA: NEGATIVE
Glucose, UA: NEGATIVE
Ketones, UA: NEGATIVE
Leukocytes,UA: NEGATIVE
Nitrite, UA: NEGATIVE
Protein,UA: NEGATIVE
RBC, UA: NEGATIVE
Specific Gravity, UA: 1.025 (ref 1.005–1.030)
Urobilinogen, Ur: 1 mg/dL (ref 0.2–1.0)
pH, UA: 5.5 (ref 5.0–7.5)

## 2021-06-18 NOTE — Progress Notes (Signed)
Assessment: 1. Recurrent UTI - Urinalysis, Routine w reflex microscopic    Plan: She will continue to ensure that her bladder is fully empty when voiding and practice timed voiding as she has in the past.  No additional treatment recommended today.  Should she develop symptoms, she will follow-up for urinalysis.  Otherwise, follow-up for yearly evaluation in March.  Chief Complaint: No chief complaint on file.   HPI: Andrea Dickson is a 71 y.o. female who presents for continued evaluation of recurrent UTIs.  Since her visit last month, she states she is doing well and has no UTI symptoms today.  She continues to have ongoing occasional intermittent stream, delayed stream, urgency, but overall is happy with her urinary status.Andrea Dickson  UA= clear PVR=61m  05/28/21 Andrea GEPPERTis a 71y.o. female with a history of recurrent UTIs who presents for evaluation of urgency, difficulty starting stream, and burning since she completed the diflucan last visit. She also c/o vaginal itching nad discharge. No odor. No fever, chills, gross heamturia. Pt has had nausea. Chronic diarrhea is still a concern. Meds for this cause intermittent constipation.. Urine culture on 05/08/2021 grew Candida tropicalis, greater than 100,000 colonies.  The patient was treated with a course of Diflucan.  Culture on 04/11/2021 was positive for Enterococcus faecalis, resistant to tetracycline. Urine clear PVR=487m  05/08/21 Andrea Dickson a [age].o. female who presents for continued evaluation of recurrent UTIs.  She was last seen 3 weeks ago and urine culture grew Enterococcus faecalis.  Resistant seen with doxycycline.  She was treated with Macrobid twice daily for 7 days.  Patient has been on Macrodantin at bedtime in the past for prevention, but states she stopped this a few months ago.  Today, she continues to complain of left flank pain which sometimes radiates across her entire abdomen and back.  Nothing  makes it worse, Tylenol is mildly helpful.  She denies urinary incontinence, no gross hematuria, no urgency, frequency.  She does have occasional discharge noted in her underwear, but denies vaginal itching.  Recent renal ultrasound indicates no evidence of hydronephrosis or pyelonephritis.  No stone presence appreciated on ultrasound or previous CT on 03/05/2021. PVR=74m59mA= 6-10WBCs, few bacteria, nitrite negative   04/11/21 Andrea Dickson a [id number] for followup for recurrent UTI. Show the past month she has had new left lower quadrant abdominal pain which is sharp, intermittent, mild to moderate land radiates to her left flank. UA today is concerning for infection. CT from 03/05/2021 shows mild left renal fullness and concern for pyelonephrosis. No fevers. She has known cirrhosis.    Portions of the above documentation were copied from a prior visit for review purposes only.  Allergies: Allergies  Allergen Reactions   Pramipexole Other (See Comments)    Shaking, palpitations, headache, faint feeling   Crestor [Rosuvastatin Calcium]     Muscle pain   Pollen Extract Other (See Comments)    Eyes and nose run    PMH: Past Medical History:  Diagnosis Date   Anxiety    Asthma    Bipolar 1 disorder ([place]  Chronic diarrhea    Cir[place]osis ([place]  Diagn[facility] September 2020[facility].    Depression    Diabetes ([place]  Essential tremor    GERD (gastroesophageal reflux disease)    Headache    High cholesterol     PSH: Past Surgical  History:  Procedure Laterality Date   ABDOMINAL HYSTERECTOMY  1997   BIOPSY  01/17/2018   Procedure: BIOPSY;  Surgeon: Daneil Dolin, MD;  [facility];  Service: Endoscopy;;  colon   carpal tunnel Bilateral 1999, 06/2009   COLONOSCOPY  2013   Dr. Britta Mccreedy: no colon polyps. quality of prep was fair. repeat colonoscopy in five years.    COLONOSCOPY WITH PROPOFOL N/A 01/17/2018   Dr. Gala Romney: Colonic  lipoma, 2 tubular adenomas removed.  Random colon biopsies negative.  Next colonoscopy 5 years.   ESOPHAGOGASTRODUODENOSCOPY (EGD) WITH PROPOFOL N/A 04/06/2019   Procedure: ESOPHAGOGASTRODUODENOSCOPY (EGD) WITH PROPOFOL;  Surgeon: Daneil Dolin, MD;   Normal esophagus, mild portal hypertensive gastropathy, otherwise normal exam.  Recommend repeat EGD in 2 years for variceal screening.    POLYPECTOMY  01/17/2018   Procedure: POLYPECTOMY;  Surgeon: Daneil Dolin, MD;  [facility];  Service: Endoscopy;;  colon   SKIN CANCER EXCISION  2007   [place]   ulner nerve  Left 06/2009   decompression    SH: Social History   Tobacco Use   Smoking status: Every Day    Packs/day: 0.50    Years: 43.00    Pack years: 21.50    Types: Cigarettes   Smokeless tobacco: Never   Tobacco comments:    smoking since 71yr old.    Vaping Use   Vaping Use: Never used  Substance Use Topics   Alcohol use: No    Alcohol/week: 0.0 standard drinks   Drug use: No    ROS: See HPI  PE: BP 124/72   Pulse 76   Ht '5\' 6"'$  (1.676 m)   Wt 160 lb (72.6 kg)   BMI 25.82 kg/m  GENERAL APPEARANCE:  Well appearing, well developed, well nourished, NAD HEENT:  Atraumatic, normocephalic NECK:  Supple. Trachea midline ABDOMEN:  Soft, non-tender, no masses EXTREMITIES:  Moves all extremities well, without clubbing, cyanosis, or edema NEUROLOGIC:  Alert and oriented x 3, normal gait, CN II-XII grossly intact MENTAL STATUS:  appropriate BACK:  Non-tender to palpation, No CVAT SKIN:  Warm, dry, and intact   Results: Laboratory Data: Lab Results  Component Value Date   WBC 2.9 (L) 03/18/2021   HGB 11.4 (L) 03/18/2021   HCT 34.3 (L) 03/18/2021   MCV 82.1 03/18/2021   PLT 63 (L) 03/18/2021    Lab Results  Component Value Date   CREATININE 0.90 03/18/2021    No results found for: PSA  No results found for: TESTOSTERONE  Lab Results  Component Value Date   HGBA1C 5.4 04/24/2015     Urinalysis    Component Value Date/Time   APPEARANCEUR Clear 05/28/2021 1607   GLUCOSEU Negative 05/28/2021 1607   BILIRUBINUR Negative 05/28/2021 1607   PROTEINUR Negative 05/28/2021 1607   UROBILINOGEN 1.0 10/08/2015 1347   NITRITE Negative 05/28/2021 1607   LEUKOCYTESUR Negative 05/28/2021 1607    Lab Results  Component Value Date   LABMICR Comment 05/28/2021   WBCUA 6-10 (A) 05/08/2021   LABEPIT 0-10 05/08/2021   BACTERIA Few 05/08/2021    Pertinent Imaging:  No results found for this or any previous visit.  No results found for this or any previous visit.  No results found for this or any previous visit.  No results found for this or any previous visit.  Results for orders placed during the hospital encounter of 05/01/21  Ultrasound renal complete  Narrative CLINICAL DATA:  left hydronephrosis  EXAM: RENAL / URINARY TRACT ULTRASOUND COMPLETE  COMPARISON:  March 05, 2021 abdominal CT  FINDINGS: Right Kidney:  Renal measurements: 10.2 x 4.5 x 5.2 cm = volume: 125 mL. Echogenicity within normal limits. No mass or hydronephrosis visualized.  Left Kidney:  Renal measurements: 11.0 x 4.3 x 5.1 cm = volume: 120 thick mL. Echogenicity within normal limits. No mass or hydronephrosis visualized.  Bladder:  Appears normal for degree of bladder distention.  Other:  Increased echogenicity of the liver with a nodular contour consistent with history of cirrhosis. Mildly prominent proximal duodenal walls on still frame image 23/24.  IMPRESSION: 1. No hydronephrosis. 2. Cirrhotic liver morphology.   Electronically Signed By: Valentino Saxon M.D. On: 05/02/2021 16:34  No results found for this or any previous visit.  No results found for this or any previous visit.  No results found for this or any previous visit.  No results found for this or any previous visit (from the past 24 hour(s)).

## 2021-06-26 ENCOUNTER — Encounter (HOSPITAL_COMMUNITY): Payer: Self-pay | Admitting: Psychiatry

## 2021-06-26 ENCOUNTER — Telehealth (INDEPENDENT_AMBULATORY_CARE_PROVIDER_SITE_OTHER): Payer: Medicare Other | Admitting: Psychiatry

## 2021-06-26 DIAGNOSIS — F3162 Bipolar disorder, current episode mixed, moderate: Secondary | ICD-10-CM | POA: Diagnosis not present

## 2021-06-26 MED ORDER — PROPRANOLOL HCL ER 80 MG PO CP24
80.0000 mg | ORAL_CAPSULE | Freq: Every day | ORAL | 2 refills | Status: DC
Start: 2021-06-26 — End: 2021-09-30

## 2021-06-26 MED ORDER — DIAZEPAM 10 MG PO TABS: ORAL_TABLET | ORAL | 2 refills | Status: DC

## 2021-06-26 MED ORDER — DIAZEPAM 2 MG PO TABS: ORAL_TABLET | ORAL | 2 refills | Status: DC

## 2021-06-26 MED ORDER — BUPROPION HCL ER (XL) 150 MG PO TB24: 150.0000 mg | ORAL_TABLET | Freq: Every morning | ORAL | 2 refills | Status: DC

## 2021-06-26 NOTE — Progress Notes (Signed)
Virtual Visit via Telephone Note  I connected with Andrea Dickson on 06/26/21 at  1:00 PM EDT by telephone and verified that I am speaking with the correct person using two identifiers.  Location: Patient: home Provider: office   I discussed the limitations, risks, security and privacy concerns of performing an evaluation and management service by telephone and the availability of in person appointments. I also discussed with the patient that there may be a patient responsible charge related to this service. The patient expressed understanding and agreed to proceed.      I discussed the assessment and treatment plan with the patient. The patient was provided an opportunity to ask questions and all were answered. The patient agreed with the plan and demonstrated an understanding of the instructions.   The patient was advised to call back or seek an in-person evaluation if the symptoms worsen or if the condition fails to improve as anticipated.  I provided 15 minutes of non-face-to-face time during this encounter.   Levonne Spiller, MD  [facility] MD/PA/NP OP Progress Note  06/26/2021 1:18 PM Andrea Dickson  MRN:  [id number]  Chief Complaint:  Chief Complaint  Patient presents with   Depression   Anxiety   Follow-up   HPI: This patient is a 71 year old separated white female who lives alone in [place]. She has 1 son and 3 grandchildren. She worked as a Engineer, technical sales for [employer].   she was referred by her primary physician, Dr. Pleas Koch for further assessment and treatment of possible bipolar disorder.  The patient returns for follow-up after 3 months.  She continues to do well by her report.  She is still frustrated with her sister not helping to care for their mother but she is doing all she can to help her mother and she feels good about it.  Last time we increased her Inderal LA on the tremor in her body and hands is much improved although it still in her hands to some  degree.  She does not really think she needs to increase the medicine any further.  She denies significant depression, anxiety or thoughts of self-harm.  She denies significant mood swings manic episodes or irritability.  She is sleeping well. Visit Diagnosis:    ICD-10-CM   1. Bipolar 1 disorder, mixed, moderate (HCC)  F31.62       Past Psychiatric History: Past outpatient treatment  Past Medical History:  Past Medical History:  Diagnosis Date   Anxiety    Asthma    Bipolar 1 disorder ([place])    Chronic diarrhea    Cirrhosis ([place])    Diagnosed September 2020, likely secondary to [facility], s/p Hep A/B vaccination in 2021.    Depression    Diabetes ([place])    Essential tremor    GERD (gastroesophageal reflux disease)    Headache    High cholesterol     Past Surgical History:  Procedure Laterality Date   ABDOMINAL HYSTERECTOMY  1997   BIOPSY  01/17/2018   Procedure: BIOPSY;  Surgeon: Daneil Dolin, MD;  [facility];  Service: Endoscopy;;  colon   carpal tunnel Bilateral 1999, 06/2009   COLONOSCOPY  2013   Dr. Britta Mccreedy: no colon polyps. quality of prep was fair. repeat colonoscopy in five years.    COLONOSCOPY WITH PROPOFOL N/A 01/17/2018   Dr. Gala Romney: Colonic lipoma, 2 tubular adenomas removed.  Random colon biopsies negative.  Next colonoscopy 5 years.   ESOPHAGOGASTRODUODENOSCOPY (EGD) WITH PROPOFOL N/A 04/06/2019  Procedure: ESOPHAGOGASTRODUODENOSCOPY (EGD) WITH PROPOFOL;  Surgeon: Daneil Dolin, MD;   Normal esophagus, mild portal hypertensive gastropathy, otherwise normal exam.  Recommend repeat EGD in 2 years for variceal screening.    POLYPECTOMY  01/17/2018   Procedure: POLYPECTOMY;  Surgeon: Daneil Dolin, MD;  [facility];  Service: Endoscopy;;  colon   SKIN CANCER EXCISION  2007   [facility]   ulner nerve  Left 06/2009   decompression    Family Psychiatric History: See below  Family History:  Family History  Problem Relation Age of Onset    Diabetes Mother    Brain cancer Mother    Diabetes Father    Heart attack Father    Heart disease Father    Diabetes Sister    Bipolar disorder Brother    Diabetes Brother    Heart disease Brother    Colon cancer Neg Hx    Breast cancer Neg Hx     Social History:  Social History   Socioeconomic History   Marital status: Divorced    Spouse name: Not on file   Number of children: 1   Years of education: 12   Highest education level: Not on file  Occupational History   Occupation: retired    Comment: employed at [employer]   Smoking status: Every Day    Packs/day: 0.50    Years: 43.00    Total pack years: 21.50    Types: Cigarettes   Smokeless tobacco: Never   Tobacco comments:    smoking since 71yr old.    Vaping Use   Vaping Use: Never used  Substance and Sexual Activity   Alcohol use: No    Alcohol/week: 0.0 standard drinks of alcohol   Drug use: No   Sexual activity: Not Currently  Other Topics Concern   Not on file  Social History Narrative   Lives alone.  Retired.  Education 12th grade.  One child.     Caffeine use- sodas, 2 daily   Social Determinants of Health   Financial Resource Strain: Not on file  Food Insecurity: Not on file  Transportation Needs: Not on file  Physical Activity: Not on file  Stress: Not on file  Social Connections: Not on file    Allergies:  Allergies  Allergen Reactions   Pramipexole Other (See Comments)    Shaking, palpitations, headache, faint feeling   Crestor [Rosuvastatin Calcium]     Muscle pain   Pollen Extract Other (See Comments)    Eyes and nose run    Metabolic Disorder Labs: Lab Results  Component Value Date   HGBA1C 5.4 04/24/2015   MPG 108 04/24/2015   No results found for: "PROLACTIN" Lab Results  Component Value Date   CHOL 178 02/17/2016   TRIG 229 (H) 02/17/2016   HDL 30 (L) 02/17/2016   CHOLHDL 5.9 (H) 02/17/2016   VLDL 46 (H) 02/17/2016   LDLCALC 102 (H) 02/17/2016    LDLCALC 126 (H) 11/18/2015   Lab Results  Component Value Date   TSH 2.96 08/29/2019   TSH 2.27 10/13/2018    Therapeutic Level Labs: Lab Results  Component Value Date   LITHIUM 0.8 08/29/2019   LITHIUM 0.4 (L) 10/13/2018   Lab Results  Component Value Date   VALPROATE 62.2 08/20/2016   VALPROATE 83.0 10/14/2015   No results found for: "CBMZ"  Current Medications: Current Outpatient Medications  Medication Sig Dispense Refill   acetaminophen (TYLENOL) 500 MG tablet  Take 1,000 mg by mouth 2 (two) times daily as needed for moderate pain or headache.     buPROPion (WELLBUTRIN XL) 150 MG 24 hr tablet Take 1 tablet (150 mg total) by mouth every morning. 30 tablet 2   cetirizine (ZYRTEC) 10 MG tablet Take 10 mg by mouth daily.     Cholecalciferol (VITAMIN D3) 50 MCG (2000 UT) TABS Take 2,000 Units by mouth daily.      diazepam (VALIUM) 10 MG tablet TAKE ONE TABLET BY MOUTH EVERYDAY AT BEDTIME 60 tablet 2   diazepam (VALIUM) 2 MG tablet TAKE ONE TABLET BY MOUTH twice daily AS NEEDED FOR ANXIETY 60 tablet 2   dicyclomine (BENTYL) 10 MG capsule Take 10 mg by mouth every 6 (six) hours as needed.     estrogens, conjugated, (PREMARIN) 1.25 MG tablet Take 1.25 mg by mouth daily.      ezetimibe (ZETIA) 10 MG tablet Take 10 mg by mouth daily.     IGLUCOSE TEST STRIPS test strip daily. use as directed     loperamide (IMODIUM) 2 MG capsule Take 2 mg by mouth as needed for diarrhea or loose stools.     methocarbamol (ROBAXIN) 500 MG tablet Take 500 mg by mouth daily as needed for muscle spasms.     nitrofurantoin, macrocrystal-monohydrate, (MACROBID) 100 MG capsule Take 1 capsule (100 mg total) by mouth 2 (two) times daily. 14 capsule 0   omeprazole (PRILOSEC) 40 MG capsule Take 40 mg by mouth daily.     ondansetron (ZOFRAN) 4 MG tablet TAKE 1 TABLET BY MOUTH EVERY 8 HOURS AS NEEDED FOR NAUSEA AND VOMITING (Patient taking differently: Take 4 mg by mouth every 8 (eight) hours as needed for  nausea or vomiting.) 20 tablet 0   propranolol ER (INDERAL LA) 80 MG 24 hr capsule Take 1 capsule (80 mg total) by mouth daily. 30 capsule 2   RYBELSUS 3 MG TABS Take by mouth.     No current facility-administered medications for this visit.     Musculoskeletal: Strength & Muscle Tone: within normal limits Gait & Station: normal Patient leans: N/A  Psychiatric Specialty Exam: Review of Systems  Neurological:  Positive for tremors.  All other systems reviewed and are negative.   There were no vitals taken for this visit.There is no height or weight on file to calculate BMI.  General Appearance: NA  Eye Contact:  NA  Speech:  Clear and Coherent  Volume:  Normal  Mood:  Euthymic  Affect:  NA  Thought Process:  Goal Directed  Orientation:  Full (Time, Place, and Person)  Thought Content: WDL   Suicidal Thoughts:  No  Homicidal Thoughts:  No  Memory:  Immediate;   Good Recent;   Good Remote;   Good  Judgement:  Good  Insight:  Good  Psychomotor Activity:  Tremor  Concentration:  Concentration: Good and Attention Span: Good  Recall:  Good  Fund of Knowledge: Good  Language: Good  Akathisia:  No  Handed:  Right  AIMS (if indicated): not done  Assets:  Communication Skills Desire for Improvement Resilience Social Support Talents/Skills  ADL's:  Intact  Cognition: WNL  Sleep:  Good   Screenings: PHQ2-9    Flowsheet Row Video Visit from 06/26/2021 in [place] [facility] Video Visit from 03/26/2021 in [place] [facility] Video Visit from 12/18/2020 in [place] Video Visit from 09/24/2020 in [place] [facility] Video Visit from 06/24/2020 in  [facility]  PHQ-2 Total Score 0 0 0 0 0      Flowsheet Row Video Visit from 06/26/2021 in [place]  Video Visit from 03/26/2021 in [place] Video Visit from 12/18/2020 in [place] No Risk No Risk No Risk        Assessment and Plan: This patient is a 71 year old female with a history of bipolar disorder.  She continues to do well.  Her tremors are better on the Inderal LA 80 mg so this will be continued.  This also helps her anxiety.  She will continue Valium 2 mg twice daily as needed for anxiety and Valium 10 mg at bedtime for sleep as well as Wellbutrin XL 150 mg every morning for depression.  She will return to see me in 3 months  Collaboration of Care: Collaboration of Care: Primary Care Provider AEB notes will be shared with PCP at patient request  Patient/Guardian was advised Release of Information must be obtained prior to any record release in order to collaborate their care with an outside provider. Patient/Guardian was advised if they have not already done so to contact the registration department to sign all necessary forms in order for [place] to release information regarding their care.   Consent: Patient/Guardian gives verbal consent for treatment and assignment of benefits for services provided during this visit. Patient/Guardian expressed understanding and agreed to proceed.    Levonne Spiller, MD 06/26/2021, 1:18 PM

## 2021-07-11 DIAGNOSIS — E7800 Pure hypercholesterolemia, unspecified: Secondary | ICD-10-CM | POA: Diagnosis not present

## 2021-07-11 DIAGNOSIS — E782 Mixed hyperlipidemia: Secondary | ICD-10-CM | POA: Diagnosis not present

## 2021-07-24 DIAGNOSIS — E1165 Type 2 diabetes mellitus with hyperglycemia: Secondary | ICD-10-CM | POA: Diagnosis not present

## 2021-07-24 DIAGNOSIS — I10 Essential (primary) hypertension: Secondary | ICD-10-CM | POA: Diagnosis not present

## 2021-07-24 DIAGNOSIS — E7849 Other hyperlipidemia: Secondary | ICD-10-CM | POA: Diagnosis not present

## 2021-07-24 DIAGNOSIS — K219 Gastro-esophageal reflux disease without esophagitis: Secondary | ICD-10-CM | POA: Diagnosis not present

## 2021-07-30 DIAGNOSIS — E1169 Type 2 diabetes mellitus with other specified complication: Secondary | ICD-10-CM | POA: Diagnosis not present

## 2021-07-30 DIAGNOSIS — I700 Atherosclerosis of aorta: Secondary | ICD-10-CM | POA: Diagnosis not present

## 2021-07-30 DIAGNOSIS — K766 Portal hypertension: Secondary | ICD-10-CM | POA: Diagnosis not present

## 2021-07-30 DIAGNOSIS — K589 Irritable bowel syndrome without diarrhea: Secondary | ICD-10-CM | POA: Diagnosis not present

## 2021-07-30 DIAGNOSIS — N1831 Chronic kidney disease, stage 3a: Secondary | ICD-10-CM | POA: Diagnosis not present

## 2021-07-30 DIAGNOSIS — K760 Fatty (change of) liver, not elsewhere classified: Secondary | ICD-10-CM | POA: Diagnosis not present

## 2021-07-30 DIAGNOSIS — K7460 Unspecified cirrhosis of liver: Secondary | ICD-10-CM | POA: Diagnosis not present

## 2021-07-30 DIAGNOSIS — J309 Allergic rhinitis, unspecified: Secondary | ICD-10-CM | POA: Diagnosis not present

## 2021-07-30 DIAGNOSIS — Z23 Encounter for immunization: Secondary | ICD-10-CM | POA: Diagnosis not present

## 2021-07-30 DIAGNOSIS — N390 Urinary tract infection, site not specified: Secondary | ICD-10-CM | POA: Diagnosis not present

## 2021-07-30 DIAGNOSIS — H6122 Impacted cerumen, left ear: Secondary | ICD-10-CM | POA: Diagnosis not present

## 2021-07-30 DIAGNOSIS — E7849 Other hyperlipidemia: Secondary | ICD-10-CM | POA: Diagnosis not present

## 2021-08-11 DIAGNOSIS — E782 Mixed hyperlipidemia: Secondary | ICD-10-CM | POA: Diagnosis not present

## 2021-08-11 DIAGNOSIS — E1169 Type 2 diabetes mellitus with other specified complication: Secondary | ICD-10-CM | POA: Diagnosis not present

## 2021-08-11 DIAGNOSIS — E7800 Pure hypercholesterolemia, unspecified: Secondary | ICD-10-CM | POA: Diagnosis not present

## 2021-08-15 DIAGNOSIS — Z7984 Long term (current) use of oral hypoglycemic drugs: Secondary | ICD-10-CM | POA: Diagnosis not present

## 2021-08-15 DIAGNOSIS — H2513 Age-related nuclear cataract, bilateral: Secondary | ICD-10-CM | POA: Diagnosis not present

## 2021-08-15 DIAGNOSIS — E119 Type 2 diabetes mellitus without complications: Secondary | ICD-10-CM | POA: Diagnosis not present

## 2021-08-25 DIAGNOSIS — H2511 Age-related nuclear cataract, right eye: Secondary | ICD-10-CM | POA: Diagnosis not present

## 2021-08-25 DIAGNOSIS — H2512 Age-related nuclear cataract, left eye: Secondary | ICD-10-CM | POA: Diagnosis not present

## 2021-08-25 DIAGNOSIS — Z01818 Encounter for other preprocedural examination: Secondary | ICD-10-CM | POA: Diagnosis not present

## 2021-08-27 ENCOUNTER — Telehealth: Payer: Self-pay | Admitting: *Deleted

## 2021-08-27 NOTE — Telephone Encounter (Signed)
Patient is on recall for repeat US 

## 2021-08-28 NOTE — Telephone Encounter (Signed)
Recall sent 

## 2021-09-04 DIAGNOSIS — H2512 Age-related nuclear cataract, left eye: Secondary | ICD-10-CM | POA: Diagnosis not present

## 2021-09-04 DIAGNOSIS — H269 Unspecified cataract: Secondary | ICD-10-CM | POA: Diagnosis not present

## 2021-09-18 DIAGNOSIS — H2511 Age-related nuclear cataract, right eye: Secondary | ICD-10-CM | POA: Diagnosis not present

## 2021-09-29 ENCOUNTER — Encounter: Payer: Self-pay | Admitting: *Deleted

## 2021-09-30 ENCOUNTER — Telehealth (INDEPENDENT_AMBULATORY_CARE_PROVIDER_SITE_OTHER): Payer: Medicare Other | Admitting: Psychiatry

## 2021-09-30 ENCOUNTER — Encounter (HOSPITAL_COMMUNITY): Payer: Self-pay | Admitting: Psychiatry

## 2021-09-30 DIAGNOSIS — F3162 Bipolar disorder, current episode mixed, moderate: Secondary | ICD-10-CM

## 2021-09-30 MED ORDER — DIAZEPAM 10 MG PO TABS: ORAL_TABLET | ORAL | 2 refills | Status: DC

## 2021-09-30 MED ORDER — DIAZEPAM 5 MG PO TABS: 5.0000 mg | ORAL_TABLET | Freq: Two times a day (BID) | ORAL | 2 refills | Status: DC

## 2021-09-30 MED ORDER — BUPROPION HCL ER (XL) 150 MG PO TB24: 150.0000 mg | ORAL_TABLET | Freq: Every morning | ORAL | 2 refills | Status: DC

## 2021-09-30 MED ORDER — PROPRANOLOL HCL ER 80 MG PO CP24
80.0000 mg | ORAL_CAPSULE | Freq: Every day | ORAL | 2 refills | Status: DC
Start: 2021-09-30 — End: 2021-12-26

## 2021-09-30 NOTE — Progress Notes (Signed)
Virtual Visit via Telephone Note  I connected with Andrea Dickson on 09/30/21 at  1:00 PM EDT by telephone and verified that I am speaking with the correct person using two identifiers.  Location: Patient: home Provider: office   I discussed the limitations, risks, security and privacy concerns of performing an evaluation and management service by telephone and the availability of in person appointments. I also discussed with the patient that there may be a patient responsible charge related to this service. The patient expressed understanding and agreed to proceed.      I discussed the assessment and treatment plan with the patient. The patient was provided an opportunity to ask questions and all were answered. The patient agreed with the plan and demonstrated an understanding of the instructions.   The patient was advised to call back or seek an in-person evaluation if the symptoms worsen or if the condition fails to improve as anticipated.  I provided 15 minutes of non-face-to-face time during this encounter.   Levonne Spiller, MD  [facility] MD/PA/NP OP Progress Note  09/30/2021 1:23 PM Andrea Dickson  MRN:  [id number]  Chief Complaint:  Chief Complaint  Patient presents with   Depression   Manic Behavior   Anxiety   Follow-up   HPI: This patient is a 71 year old separated white female who lives alone in [place]. She has 1 son and 3 grandchildren. She worked as a Engineer, technical sales for [employer].   she was referred by her primary physician, Dr. Pleas Koch for further assessment and treatment of possible bipolar disorder.  The patient returns for follow-up after 3 months.  She states that her sister is getting her very angry lately.  She states that her sister is constantly berating her and criticizing her.  The sister lives with the mother but does nothing to care for her and the patient does all the work and caring for the mother.  The patient states that at times she is  "reached the boiling point" but she would never actually hurt her sister.  The Valium helps to some degree but it only lasts a few hours.  She is taking them both at once for a total of 5 mg during the day.  I suggested that we go up to 5 mg twice a day and she agrees.  She is sleeping well and has very little tremor.  She denies significant mood swings manic episodes or depression.  She denies suicidal ideation Visit Diagnosis:    ICD-10-CM   1. Bipolar 1 disorder, mixed, moderate (HCC)  F31.62       Past Psychiatric History: Past outpatient treatment  Past Medical History:  Past Medical History:  Diagnosis Date   Anxiety    Asthma    Bipolar 1 disorder ([place])    Chronic diarrhea    Cirrhosis ([place])    Diagnosed September 2020, likely secondary to [facility], s/p Hep A/B vaccination in 2021.    Depression    Diabetes ([place])    Essential tremor    GERD (gastroesophageal reflux disease)    Headache    High cholesterol     Past Surgical History:  Procedure Laterality Date   ABDOMINAL HYSTERECTOMY  1997   BIOPSY  01/17/2018   Procedure: BIOPSY;  Surgeon: Daneil Dolin, MD;  [facility];  Service: Endoscopy;;  colon   carpal tunnel Bilateral 1999, 06/2009   COLONOSCOPY  2013   Dr. Britta Mccreedy: no colon polyps. quality of prep was fair. repeat  colonoscopy in five years.    COLONOSCOPY WITH PROPOFOL N/A 01/17/2018   Dr. Gala Romney: Colonic lipoma, 2 tubular adenomas removed.  Random colon biopsies negative.  Next colonoscopy 5 years.   ESOPHAGOGASTRODUODENOSCOPY (EGD) WITH PROPOFOL N/A 04/06/2019   Procedure: ESOPHAGOGASTRODUODENOSCOPY (EGD) WITH PROPOFOL;  Surgeon: Daneil Dolin, MD;   Normal esophagus, mild portal hypertensive gastropathy, otherwise normal exam.  Recommend repeat EGD in 2 years for variceal screening.    POLYPECTOMY  01/17/2018   Procedure: POLYPECTOMY;  Surgeon: Daneil Dolin, MD;  [facility];  Service: Endoscopy;;  colon   SKIN CANCER EXCISION  2007    [place]   ulner nerve  Left 06/2009   decompression    Family Psychiatric History: See below  Family History:  Family History  Problem Relation Age of Onset   Diabetes Mother    Brain cancer Mother    Diabetes Father    Heart attack Father    Heart disease Father    Diabetes Sister    Bipolar disorder Brother    Diabetes Brother    Heart disease Brother    Colon cancer Neg Hx    Breast cancer Neg Hx     Social History:  Social History   Socioeconomic History   Marital status: Divorced    Spouse name: Not on file   Number of children: 1   Years of education: 12   Highest education level: Not on file  Occupational History   Occupation: retired    Comment: employed at [employer]   Smoking status: Every Day    Packs/day: 0.50    Years: 43.00    Total pack years: 21.50    Types: Cigarettes   Smokeless tobacco: Never   Tobacco comments:    smoking since 71yr old.    Vaping Use   Vaping Use: Never used  Substance and Sexual Activity   Alcohol use: No    Alcohol/week: 0.0 standard drinks of alcohol   Drug use: No   Sexual activity: Not Currently  Other Topics Concern   Not on file  Social History Narrative   Lives alone.  Retired.  Education 12th grade.  One child.     Caffeine use- sodas, 2 daily   Social Determinants of Health   Financial Resource Strain: Not on file  Food Insecurity: Not on file  Transportation Needs: Not on file  Physical Activity: Not on file  Stress: Not on file  Social Connections: Not on file    Allergies:  Allergies  Allergen Reactions   Pramipexole Other (See Comments)    Shaking, palpitations, headache, faint feeling   Crestor [Rosuvastatin Calcium]     Muscle pain   Pollen Extract Other (See Comments)    Eyes and nose run    Metabolic Disorder Labs: Lab Results  Component Value Date   HGBA1C 5.4 04/24/2015   MPG 108 04/24/2015   No results found for: "PROLACTIN" Lab Results   Component Value Date   CHOL 178 02/17/2016   TRIG 229 (H) 02/17/2016   HDL 30 (L) 02/17/2016   CHOLHDL 5.9 (H) 02/17/2016   VLDL 46 (H) 02/17/2016   LDLCALC 102 (H) 02/17/2016   LDLCALC 126 (H) 11/18/2015   Lab Results  Component Value Date   TSH 2.96 08/29/2019   TSH 2.27 10/13/2018    Therapeutic Level Labs: Lab Results  Component Value Date   LITHIUM 0.8 08/29/2019   LITHIUM 0.4 (L) 10/13/2018  Lab Results  Component Value Date   VALPROATE 62.2 08/20/2016   VALPROATE 83.0 10/14/2015   No results found for: "CBMZ"  Current Medications: Current Outpatient Medications  Medication Sig Dispense Refill   diazepam (VALIUM) 5 MG tablet Take 1 tablet (5 mg total) by mouth 2 (two) times daily. 60 tablet 2   acetaminophen (TYLENOL) 500 MG tablet Take 1,000 mg by mouth 2 (two) times daily as needed for moderate pain or headache.     buPROPion (WELLBUTRIN XL) 150 MG 24 hr tablet Take 1 tablet (150 mg total) by mouth every morning. 30 tablet 2   cetirizine (ZYRTEC) 10 MG tablet Take 10 mg by mouth daily.     Cholecalciferol (VITAMIN D3) 50 MCG (2000 UT) TABS Take 2,000 Units by mouth daily.      diazepam (VALIUM) 10 MG tablet TAKE ONE TABLET BY MOUTH EVERYDAY AT BEDTIME 60 tablet 2   dicyclomine (BENTYL) 10 MG capsule Take 10 mg by mouth every 6 (six) hours as needed.     estrogens, conjugated, (PREMARIN) 1.25 MG tablet Take 1.25 mg by mouth daily.      ezetimibe (ZETIA) 10 MG tablet Take 10 mg by mouth daily.     IGLUCOSE TEST STRIPS test strip daily. use as directed     loperamide (IMODIUM) 2 MG capsule Take 2 mg by mouth as needed for diarrhea or loose stools.     methocarbamol (ROBAXIN) 500 MG tablet Take 500 mg by mouth daily as needed for muscle spasms.     nitrofurantoin, macrocrystal-monohydrate, (MACROBID) 100 MG capsule Take 1 capsule (100 mg total) by mouth 2 (two) times daily. 14 capsule 0   omeprazole (PRILOSEC) 40 MG capsule Take 40 mg by mouth daily.      ondansetron (ZOFRAN) 4 MG tablet TAKE 1 TABLET BY MOUTH EVERY 8 HOURS AS NEEDED FOR NAUSEA AND VOMITING (Patient taking differently: Take 4 mg by mouth every 8 (eight) hours as needed for nausea or vomiting.) 20 tablet 0   propranolol ER (INDERAL LA) 80 MG 24 hr capsule Take 1 capsule (80 mg total) by mouth daily. 30 capsule 2   RYBELSUS 3 MG TABS Take by mouth.     No current facility-administered medications for this visit.     Musculoskeletal: Strength & Muscle Tone: na Gait & Station: na Patient leans: N/A  Psychiatric Specialty Exam: Review of Systems  All other systems reviewed and are negative.   There were no vitals taken for this visit.There is no height or weight on file to calculate BMI.  General Appearance: NA  Eye Contact:  NA  Speech:  Clear and Coherent  Volume:  Normal  Mood:  Anxious and Euthymic  Affect:  NA  Thought Process:  Goal Directed  Orientation:  Full (Time, Place, and Person)  Thought Content: WDL   Suicidal Thoughts:  No  Homicidal Thoughts:  No  Memory:  Immediate;   Good Recent;   Good Remote;   Good  Judgement:  Good  Insight:  Good  Psychomotor Activity:  Tremor  Concentration:  Concentration: Good and Attention Span: Good  Recall:  Good  Fund of Knowledge: Good  Language: Good  Akathisia:  No  Handed:  Right  AIMS (if indicated): not done  Assets:  Communication Skills Desire for Improvement Resilience Social Support Talents/Skills  ADL's:  Intact  Cognition: WNL  Sleep:  Good   Screenings: PHQ2-9    Flowsheet Row Video Visit from 09/30/2021 in [place]  [facility] Video Visit from 06/26/2021 in [place] Video Visit from 03/26/2021 in [place] Video Visit from 12/18/2020 in [place] Video Visit from 09/24/2020 in [place]  [facility]  PHQ-2 Total Score 0 0 0 0 0      Flowsheet Row Video Visit from 09/30/2021 in [place] Video Visit from 06/26/2021 in [place] Video Visit from 03/26/2021 in [place] No Risk No Risk No Risk        Assessment and Plan: This patient is a 71 year old female with a history of bipolar disorder.  She is having more irritability with her sister so we will increase Valium during the day to 5 mg twice daily and continue Valium 10 mg at bedtime for sleep.  She will continue Wellbutrin XL 150 mg every morning for depression and Inderal LA 80 mg daily for tremor.  She will return to see me in 3 months  Collaboration of Care: Collaboration of Care: Primary Care Provider AEB notes will be shared with PCP at patient request  Patient/Guardian was advised Release of Information must be obtained prior to any record release in order to collaborate their care with an outside provider. Patient/Guardian was advised if they have not already done so to contact the registration department to sign all necessary forms in order for [place] to release information regarding their care.   Consent: Patient/Guardian gives verbal consent for treatment and assignment of benefits for services provided during this visit. Patient/Guardian expressed understanding and agreed to proceed.    Levonne Spiller, MD 09/30/2021, 1:23 PM

## 2021-10-01 ENCOUNTER — Telehealth (HOSPITAL_COMMUNITY): Payer: Self-pay

## 2021-10-01 NOTE — Telephone Encounter (Signed)
Andrea Dickson with pharmacy called asking if  she should dispense the new '5mg'$  Rx for Valium as pt was just mailed '2mg'$  tablets on 9/18 or should they wait until she is due for a refill to fill the '5mg'$  tablets?

## 2021-10-01 NOTE — Telephone Encounter (Signed)
Octavia, tell her to use up the 2 mg, pharmacies won't fill controlled meds early

## 2021-10-02 NOTE — Telephone Encounter (Signed)
Spoke with patient and Radonna Ricker from the pharmacy and informed them with what provider stated and both agreed and verbalized understanding.

## 2021-10-11 DIAGNOSIS — E1169 Type 2 diabetes mellitus with other specified complication: Secondary | ICD-10-CM | POA: Diagnosis not present

## 2021-10-11 DIAGNOSIS — K219 Gastro-esophageal reflux disease without esophagitis: Secondary | ICD-10-CM | POA: Diagnosis not present

## 2021-10-11 DIAGNOSIS — E782 Mixed hyperlipidemia: Secondary | ICD-10-CM | POA: Diagnosis not present

## 2021-10-23 DIAGNOSIS — Z780 Asymptomatic menopausal state: Secondary | ICD-10-CM | POA: Diagnosis not present

## 2021-10-23 DIAGNOSIS — M810 Age-related osteoporosis without current pathological fracture: Secondary | ICD-10-CM | POA: Diagnosis not present

## 2021-10-29 DIAGNOSIS — E1169 Type 2 diabetes mellitus with other specified complication: Secondary | ICD-10-CM | POA: Diagnosis not present

## 2021-10-29 DIAGNOSIS — E1165 Type 2 diabetes mellitus with hyperglycemia: Secondary | ICD-10-CM | POA: Diagnosis not present

## 2021-10-29 DIAGNOSIS — I10 Essential (primary) hypertension: Secondary | ICD-10-CM | POA: Diagnosis not present

## 2021-10-29 DIAGNOSIS — N1831 Chronic kidney disease, stage 3a: Secondary | ICD-10-CM | POA: Diagnosis not present

## 2021-11-03 DIAGNOSIS — F17210 Nicotine dependence, cigarettes, uncomplicated: Secondary | ICD-10-CM | POA: Diagnosis not present

## 2021-11-03 DIAGNOSIS — R030 Elevated blood-pressure reading, without diagnosis of hypertension: Secondary | ICD-10-CM | POA: Diagnosis not present

## 2021-11-03 DIAGNOSIS — N390 Urinary tract infection, site not specified: Secondary | ICD-10-CM | POA: Diagnosis not present

## 2021-11-03 DIAGNOSIS — Z23 Encounter for immunization: Secondary | ICD-10-CM | POA: Diagnosis not present

## 2021-11-03 DIAGNOSIS — E7849 Other hyperlipidemia: Secondary | ICD-10-CM | POA: Diagnosis not present

## 2021-11-03 DIAGNOSIS — K589 Irritable bowel syndrome without diarrhea: Secondary | ICD-10-CM | POA: Diagnosis not present

## 2021-11-03 DIAGNOSIS — D696 Thrombocytopenia, unspecified: Secondary | ICD-10-CM | POA: Diagnosis not present

## 2021-11-03 DIAGNOSIS — K760 Fatty (change of) liver, not elsewhere classified: Secondary | ICD-10-CM | POA: Diagnosis not present

## 2021-11-03 DIAGNOSIS — I700 Atherosclerosis of aorta: Secondary | ICD-10-CM | POA: Diagnosis not present

## 2021-11-03 DIAGNOSIS — K7460 Unspecified cirrhosis of liver: Secondary | ICD-10-CM | POA: Diagnosis not present

## 2021-11-03 DIAGNOSIS — E1169 Type 2 diabetes mellitus with other specified complication: Secondary | ICD-10-CM | POA: Diagnosis not present

## 2021-11-03 DIAGNOSIS — N1831 Chronic kidney disease, stage 3a: Secondary | ICD-10-CM | POA: Diagnosis not present

## 2021-11-10 DIAGNOSIS — K7460 Unspecified cirrhosis of liver: Secondary | ICD-10-CM | POA: Diagnosis not present

## 2021-11-10 DIAGNOSIS — D696 Thrombocytopenia, unspecified: Secondary | ICD-10-CM | POA: Diagnosis not present

## 2021-11-11 DIAGNOSIS — E782 Mixed hyperlipidemia: Secondary | ICD-10-CM | POA: Diagnosis not present

## 2021-11-11 DIAGNOSIS — K219 Gastro-esophageal reflux disease without esophagitis: Secondary | ICD-10-CM | POA: Diagnosis not present

## 2021-11-11 DIAGNOSIS — E1169 Type 2 diabetes mellitus with other specified complication: Secondary | ICD-10-CM | POA: Diagnosis not present

## 2021-11-24 DIAGNOSIS — K7460 Unspecified cirrhosis of liver: Secondary | ICD-10-CM | POA: Diagnosis not present

## 2021-11-24 DIAGNOSIS — D696 Thrombocytopenia, unspecified: Secondary | ICD-10-CM | POA: Diagnosis not present

## 2021-12-18 ENCOUNTER — Telehealth: Payer: Self-pay

## 2021-12-18 NOTE — Telephone Encounter (Signed)
Patient returned call see alternate note dated 12/18/2021.

## 2021-12-18 NOTE — Telephone Encounter (Signed)
Patient called with complaints of left lower abdominal pain, pain radiates into her back. Per patient she has had this for months,but feels it is worsening. Patient denies any fever, constipation, dark or bloody stools. She is having at least two loose bm's per day, and is taking bentyl once per day.Last seen by Dr.Rourk 03/18/2021 and had tcs 01/17/2018 and egd 04/06/2019. Has no future appointment scheduled. Patient uses [facility]

## 2021-12-18 NOTE — Telephone Encounter (Signed)
Patient left a message on Tammy Clifton's vm and asked that someone return her call. I called back and did not get anyone on the phone. I did leave a message if she still needed assistance to please return call to the office.

## 2021-12-19 NOTE — Telephone Encounter (Signed)
Thanks

## 2021-12-19 NOTE — Telephone Encounter (Signed)
Hey Per Dr. Gala Romney patient needs to be seen ASAP. Thanks

## 2021-12-21 NOTE — Progress Notes (Unsigned)
GI Office Note    Referring Provider: Curlene Labrum, MD Primary Care Physician:  Curlene Labrum, MD Primary Gastroenterologist: Cristopher Estimable.Rourk, MD  Date:  12/24/2021  ID:  Andrea Dickson, Andrea Dickson 1950-10-16, MRN [id number]   Chief Complaint   Chief Complaint  Patient presents with   Abdominal Pain    Upper left side pain. Nausea can't eat.     History of Present Illness  Andrea Dickson is a 71 y.o. female with a history of bipolar disorder, anxiety, depression, GERD, HLD, diabetes, and NASH cirrhosis*** presenting today with complaint of LLQ abdominal pain.  Last office visit 03/18/2021. ***Noted to be last seen in 2021.  Felt to have well compensated cirrhosis with the basis of NASH.  Previously vaccinated hepatitis a and B.  Prior evaluation revealed negative ANA, AMA, immunoglobulins, hepatitis C.  Iron studies normal.  Noted have history of chronic left lower quadrant/left flank abdominal pain and tendency toward diarrhea with previous extensive evaluation with biopsies negative for microscopic colitis, negative celiac screen, negative stool studies and fecal elastase.  Lactose-free diet empiric course of pancreatic enzymes made no difference.  Denied nausea or vomiting.  On omeprazole 40 mg daily for control of reflux.  Denies dysphagia.  Reportedly taking dicyclomine 20 mg 3 times per day as needed for diarrhea.  Taking propranolol 60 mg daily for essential tremor.  Surveillance colonoscopy in 2025.  Had recent CT confirming cirrhosis and evidence of portal hypertension, no evidence of HCC.  MELD labs ordered as well as AFP.  Hepatic ultrasound every 6 months, continue dicyclomine for loose stools.  Colonoscopy 01/17/2018: -2 polyps in the ascending colon (tubular adenoma) -segmental biopsy of colon (benign) -Colonic lipoma -Repeat TCS in 5 years  EGD 04/06/2019: -Normal esophagus -Portal hypertensive gastropathy -Normal duodenum -Advised repeat EGD in 2 years for  screening.  Renal ultrasound 05/01/2021 with no hydronephrosis.  Cirrhotic liver morphology noted.  Mildly prominent proximal duodenal walls.  Labs 11/10/2021: Hemoglobin 11.2, platelets 55, albumin 3.2, T. bili 0.8, AST 17, ALT 11, alk phos 116, sodium 133, folate and B12 within normal limits.  Normal iron and ferritin.  Patient called the office 12/17/2021 reporting left lower quadrant abdominal pain radiating to her back, lasting for few months but progressively worsening.  Patient was recommended to have an office visit for further evaluation.   Today: Cirrhosis history: Hematemesis/coffee ground emesis:No History of variceal bleeding: No Abdominal pain: yes, LLQ Abdominal distention/worsening ascites: reported some bloating of her abdomen when she went to see her cancer doctor.  Fever/chills: No Episodes of confusion/disorientation: No Number of daily bowel movements: has a BM about twice per week possibly.  Taking diuretics?: No Date of last EGD: March 2021 Prior history of banding?: No Prior episodes of SBP: No Last time liver imaging was performed: April 2023 No Jaundice.  MELD score:***  Abdominal pain - Primarily LLQ and goes up her side into her back. Unable to eat dairy products and wheat. Told by her oncologist to avoid those foods. States this has been going on for a long time. In between constipation and diarrhea.Has some urgency and has had some accidents. Sometimes has to strain to have a BM but not often. Has nausea everyday, no vomiting. Gallbladder remains in place. Able to eat and keep food down. If she overeats she starts having a lot of pain. States she had lost weight initially but has gained a few pounds back. Is taking the dicyclomine 10 mg once daily but  does not feel like it helps. Is taking zofran and sometimes takes it twice a day but usually once per day. Takes one imodium every day. Certain things that she eats will make her go to the bathroom. Sick and nauseas  everyday. Food smells are not bothersome.   Has occasional breakthrough symptoms about twice per week. Does not take anything for breakthrough.   Follows with urology for frequent UTIs and has been treated multiple times.   Taking Rybelsus for blood sugars.    Current Outpatient Medications  Medication Sig Dispense Refill   acetaminophen (TYLENOL) 500 MG tablet Take 1,000 mg by mouth 2 (two) times daily as needed for moderate pain or headache.     buPROPion (WELLBUTRIN XL) 150 MG 24 hr tablet Take 1 tablet (150 mg total) by mouth every morning. 30 tablet 2   cetirizine (ZYRTEC) 10 MG tablet Take 10 mg by mouth daily.     Cholecalciferol (VITAMIN D3) 50 MCG (2000 UT) TABS Take 2,000 Units by mouth daily.      diazepam (VALIUM) 10 MG tablet TAKE ONE TABLET BY MOUTH EVERYDAY AT BEDTIME 60 tablet 2   diazepam (VALIUM) 5 MG tablet Take 1 tablet (5 mg total) by mouth 2 (two) times daily. 60 tablet 2   estrogens, conjugated, (PREMARIN) 1.25 MG tablet Take 1.25 mg by mouth daily.      ezetimibe (ZETIA) 10 MG tablet Take 10 mg by mouth daily.     Lactobacillus-Inulin ([place]) CAPS Take 1 capsule by mouth daily.     loperamide (IMODIUM) 2 MG capsule Take 2 mg by mouth as needed for diarrhea or loose stools.     omeprazole (PRILOSEC) 40 MG capsule Take 40 mg by mouth daily.     ondansetron (ZOFRAN) 4 MG tablet TAKE 1 TABLET BY MOUTH EVERY 8 HOURS AS NEEDED FOR NAUSEA AND VOMITING (Patient taking differently: Take 4 mg by mouth every 8 (eight) hours as needed for nausea or vomiting.) 20 tablet 0   propranolol ER (INDERAL LA) 80 MG 24 hr capsule Take 1 capsule (80 mg total) by mouth daily. 30 capsule 2   RYBELSUS 3 MG TABS Take by mouth.     dicyclomine (BENTYL) 10 MG capsule Take 10 mg by mouth every 6 (six) hours as needed. (Patient not taking: Reported on 12/24/2021)     No current facility-administered medications for this visit.    Past Medical History:  Diagnosis Date    Anxiety    Asthma    Bipolar 1 disorder ([place])    Chronic diarrhea    Cirrhosis ([place])    Diagnosed September 2020, likely secondary to [facility], s/p Hep A/B vaccination in 2021.    Depression    Diabetes ([place])    Essential tremor    GERD (gastroesophageal reflux disease)    Headache    High cholesterol     Past Surgical History:  Procedure Laterality Date   ABDOMINAL HYSTERECTOMY  1997   BIOPSY  01/17/2018   Procedure: BIOPSY;  Surgeon: Daneil Dolin, MD;  Location: [facility];  Service: Endoscopy;;  colon   carpal tunnel Bilateral 1999, 06/2009   COLONOSCOPY  2013   Dr. Britta Mccreedy: no colon polyps. quality of prep was fair. repeat colonoscopy in five years.    COLONOSCOPY WITH PROPOFOL N/A 01/17/2018   Dr. Gala Romney: Colonic lipoma, 2 tubular adenomas removed.  Random colon biopsies negative.  Next colonoscopy 5 years.   ESOPHAGOGASTRODUODENOSCOPY (EGD) WITH PROPOFOL N/A 04/06/2019  Procedure: ESOPHAGOGASTRODUODENOSCOPY (EGD) WITH PROPOFOL;  Surgeon: Daneil Dolin, MD;   Normal esophagus, mild portal hypertensive gastropathy, otherwise normal exam.  Recommend repeat EGD in 2 years for variceal screening.    POLYPECTOMY  01/17/2018   Procedure: POLYPECTOMY;  Surgeon: Daneil Dolin, MD;  Location: [facility];  Service: Endoscopy;;  colon   SKIN CANCER EXCISION  2007   [place]   ulner nerve  Left 06/2009   decompression    Family History  Problem Relation Age of Onset   Diabetes Mother    Brain cancer Mother    Diabetes Father    Heart attack Father    Heart disease Father    Diabetes Sister    Bipolar disorder Brother    Diabetes Brother    Heart disease Brother    Colon cancer Neg Hx    Breast cancer Neg Hx     Allergies as of 12/24/2021 - Review Complete 12/24/2021  Allergen Reaction Noted   Pramipexole Other (See Comments) 04/24/2015   Crestor [rosuvastatin calcium]  03/28/2019   Pollen extract Other (See Comments) 04/24/2015    Social History    Socioeconomic History   Marital status: Divorced    Spouse name: Not on file   Number of children: 1   Years of education: 12   Highest education level: Not on file  Occupational History   Occupation: retired    Comment: employed at [employer] Use   Smoking status: Every Day    Packs/day: 0.50    Years: 43.00    Total pack years: 21.50    Types: Cigarettes   Smokeless tobacco: Never   Tobacco comments:    smoking since 71yr old.    Vaping Use   Vaping Use: Never used  Substance and Sexual Activity   Alcohol use: No    Alcohol/week: 0.0 standard drinks of alcohol   Drug use: No   Sexual activity: Not Currently  Other Topics Concern   Not on file  Social History Narrative   Lives alone.  Retired.  Education 12th grade.  One child.     Caffeine use- sodas, 2 daily   Social Determinants of Health   Financial Resource Strain: Not on file  Food Insecurity: Not on file  Transportation Needs: Not on file  Physical Activity: Not on file  Stress: Not on file  Social Connections: Not on file     Review of Systems   Gen: Denies fever, chills, anorexia. Denies fatigue, weakness, weight loss.  CV: Denies chest pain, palpitations, syncope, peripheral edema, and claudication. Resp: Denies dyspnea at rest, cough, wheezing, coughing up blood, and pleurisy. GI: See HPI Derm: Denies rash, itching, dry skin Psych: Denies depression, anxiety, memory loss, confusion. No homicidal or suicidal ideation.  Heme: Denies bruising, bleeding, and enlarged lymph nodes.   Physical Exam   BP 121/61 (BP Location: Right Arm, Patient Position: Sitting, Cuff Size: Normal)   Pulse 70   Temp 97.6 F (36.4 C) (Temporal)   Ht _0  (1.676 m)   Wt 156 lb 9.6 oz (71 kg)   SpO2 94%   BMI 25.28 kg/m   General:   Alert and oriented. No distress noted. Pleasant and cooperative.  Head:  Normocephalic and atraumatic. Eyes:  Conjuctiva clear without scleral icterus. Mouth:  Oral  mucosa pink and moist. Good dentition. No lesions. Lungs:  Clear to auscultation bilaterally. No wheezes, rales, or rhonchi. No distress.  Heart:  S1, S2  present without murmurs appreciated.  Abdomen:  +BS, soft, non-distended. TTP to lower abdomen and epigastric region. No rebound or guarding. No HSM or masses noted. Rectal: deferred Msk:  Symmetrical without gross deformities. Normal posture. Extremities:  Without edema. Neurologic:  Alert and  oriented x4 Psych:  Alert and cooperative. Normal mood and affect.   Assessment  JEMIMA PETKO is a 71 y.o. female with a history of bipolar disorder, essential tremors, anxiety, depression, GERD, HLD, diabetes, and NASH cirrhosis*** presenting today with complaint of LLQ abdominal pain.  Abdominal pain, LLQ, N/V:   Cirrhosis:   IBS: Mixed constipation and diarrhea.   PLAN   *** U/S abdomen CBC, CMP, INR, Lipase, TSH Zofran 12m TID as needed, refilled today.  Increase omeprazole to 40 mg twice daily. Proceed with upper endoscopy with propofol by Dr. RGala Romneyin near future: the risks, benefits, and alternatives have been discussed with the patient in detail. The patient states understanding and desires to proceed.  ASA 2 Hold Rybelsus the day prior to and the morning of the procedure Cipro 500 mg BID and Flagyl 500 mg TID for 7 days.  Stop imodium Seldom use of dicyclomine Start miralax 17g daily.  Daily fiber supplement Follow up in 4-6 weeks.    CVenetia Night MSN, FNP-BC, AGACNP-BC [facility]

## 2021-12-21 NOTE — H&P (View-Only) (Signed)
GI Office Note    Referring Provider: Curlene Labrum, MD Primary Care Physician:  Curlene Labrum, MD Primary Gastroenterologist: Cristopher Estimable.Rourk, MD  Date:  12/24/2021  ID:  Nyima, Vanacker 12/23/1950, MRN [id number]   Chief Complaint   Chief Complaint  Patient presents with   Abdominal Pain    Upper left side pain. Nausea can't eat.     History of Present Illness  RIELY OETKEN is a 71 y.o. female with a history of bipolar disorder, anxiety, depression, GERD, HLD, diabetes, and NASH cirrhosis presenting today with complaint of LLQ abdominal pain.  Last office visit 03/18/2021. Noted to be last seen in 2021.  Felt to have well compensated cirrhosis with the basis of NASH.  Previously vaccinated hepatitis a and B.  Prior evaluation revealed negative ANA, AMA, immunoglobulins, hepatitis C.  Iron studies normal.  Noted have history of chronic left lower quadrant/left flank abdominal pain and tendency toward diarrhea with previous extensive evaluation with biopsies negative for microscopic colitis, negative celiac screen, negative stool studies and fecal elastase.  Lactose-free diet empiric course of pancreatic enzymes made no difference.  Denied nausea or vomiting.  On omeprazole 40 mg daily for control of reflux.  Denies dysphagia.  Reportedly taking dicyclomine 20 mg 3 times per day as needed for diarrhea.  Taking propranolol 60 mg daily for essential tremor.  Surveillance colonoscopy in 2025.  Had recent CT confirming cirrhosis and evidence of portal hypertension, no evidence of HCC.  MELD labs ordered as well as AFP.  Hepatic ultrasound every 6 months, continue dicyclomine for loose stools.  Colonoscopy 01/17/2018: -2 polyps in the ascending colon (tubular adenoma) -segmental biopsy of colon (benign) -Colonic lipoma -Repeat TCS in 5 years  EGD 04/06/2019: -Normal esophagus -Portal hypertensive gastropathy -Normal duodenum -Advised repeat EGD in 2 years for  screening.  Renal ultrasound 05/01/2021 with no hydronephrosis.  Cirrhotic liver morphology noted.  Mildly prominent proximal duodenal walls.  Labs 11/10/2021: Hemoglobin 11.2, platelets 55, albumin 3.2, T. bili 0.8, AST 17, ALT 11, alk phos 116, sodium 133, folate and B12 within normal limits.  Normal iron and ferritin.  Patient called the office 12/17/2021 reporting left lower quadrant abdominal pain radiating to her back, lasting for few months but progressively worsening.  Patient was recommended to have an office visit for further evaluation.   Today: Cirrhosis history: Hematemesis/coffee ground emesis:No History of variceal bleeding: No Abdominal pain: yes, LLQ Abdominal distention/worsening ascites: reported some bloating of her abdomen when she went to see her cancer doctor.  Fever/chills: No Episodes of confusion/disorientation: No Number of daily bowel movements: has a BM about twice per week possibly.  Taking diuretics?: No Date of last EGD: March 2021 Prior history of banding?: No Prior episodes of SBP: No Last time liver imaging was performed: April 2023 No Jaundice.  MELD score: N/A, due for labs with INR to calculate.  Abdominal pain - Primarily LLQ and goes up her side into her back. Unable to eat dairy products and wheat. Told by her oncologist to avoid those foods. States this has been going on for a long time. In between constipation and diarrhea.Has some urgency and has had some accidents. Sometimes has to strain to have a BM but not often. Has nausea everyday, no vomiting. Gallbladder remains in place. Able to eat and keep food down. If she overeats she starts having a lot of pain. States she had lost weight initially but has gained a few pounds back. Is  taking the dicyclomine 10 mg once daily but does not feel like it helps. Is taking zofran and sometimes takes it twice a day but usually once per day. Takes one imodium every day. Certain things that she eats will make  her go to the bathroom. Sick and nauseas everyday. Food smells are not bothersome.   Has occasional breakthrough symptoms about twice per week. Does not take anything for breakthrough.   Follows with urology for frequent UTIs and has been treated multiple times.   Taking Rybelsus for blood sugars.    Current Outpatient Medications  Medication Sig Dispense Refill   acetaminophen (TYLENOL) 500 MG tablet Take 1,000 mg by mouth 2 (two) times daily as needed for moderate pain or headache.     buPROPion (WELLBUTRIN XL) 150 MG 24 hr tablet Take 1 tablet (150 mg total) by mouth every morning. 30 tablet 2   cetirizine (ZYRTEC) 10 MG tablet Take 10 mg by mouth daily.     Cholecalciferol (VITAMIN D3) 50 MCG (2000 UT) TABS Take 2,000 Units by mouth daily.      diazepam (VALIUM) 10 MG tablet TAKE ONE TABLET BY MOUTH EVERYDAY AT BEDTIME 60 tablet 2   diazepam (VALIUM) 5 MG tablet Take 1 tablet (5 mg total) by mouth 2 (two) times daily. 60 tablet 2   estrogens, conjugated, (PREMARIN) 1.25 MG tablet Take 1.25 mg by mouth daily.      ezetimibe (ZETIA) 10 MG tablet Take 10 mg by mouth daily.     Lactobacillus-Inulin ([place]) CAPS Take 1 capsule by mouth daily.     loperamide (IMODIUM) 2 MG capsule Take 2 mg by mouth as needed for diarrhea or loose stools.     omeprazole (PRILOSEC) 40 MG capsule Take 40 mg by mouth daily.     ondansetron (ZOFRAN) 4 MG tablet TAKE 1 TABLET BY MOUTH EVERY 8 HOURS AS NEEDED FOR NAUSEA AND VOMITING (Patient taking differently: Take 4 mg by mouth every 8 (eight) hours as needed for nausea or vomiting.) 20 tablet 0   propranolol ER (INDERAL LA) 80 MG 24 hr capsule Take 1 capsule (80 mg total) by mouth daily. 30 capsule 2   RYBELSUS 3 MG TABS Take by mouth.     dicyclomine (BENTYL) 10 MG capsule Take 10 mg by mouth every 6 (six) hours as needed. (Patient not taking: Reported on 12/24/2021)     No current facility-administered medications for this visit.     Past Medical History:  Diagnosis Date   Anxiety    Asthma    Bipolar 1 disorder ([place])    Chronic diarrhea    Cirrhosis ([place])    Diagnosed September 2020, likely secondary to [facility], s/p Hep A/B vaccination in 2021.    Depression    Diabetes ([place])    Essential tremor    GERD (gastroesophageal reflux disease)    Headache    High cholesterol     Past Surgical History:  Procedure Laterality Date   ABDOMINAL HYSTERECTOMY  1997   BIOPSY  01/17/2018   Procedure: BIOPSY;  Surgeon: Daneil Dolin, MD;  Location: [facility];  Service: Endoscopy;;  colon   carpal tunnel Bilateral 1999, 06/2009   COLONOSCOPY  2013   Dr. Britta Mccreedy: no colon polyps. quality of prep was fair. repeat colonoscopy in five years.    COLONOSCOPY WITH PROPOFOL N/A 01/17/2018   Dr. Gala Romney: Colonic lipoma, 2 tubular adenomas removed.  Random colon biopsies negative.  Next colonoscopy 5 years.  ESOPHAGOGASTRODUODENOSCOPY (EGD) WITH PROPOFOL N/A 04/06/2019   Procedure: ESOPHAGOGASTRODUODENOSCOPY (EGD) WITH PROPOFOL;  Surgeon: Daneil Dolin, MD;   Normal esophagus, mild portal hypertensive gastropathy, otherwise normal exam.  Recommend repeat EGD in 2 years for variceal screening.    POLYPECTOMY  01/17/2018   Procedure: POLYPECTOMY;  Surgeon: Daneil Dolin, MD;  Location: [facility];  Service: Endoscopy;;  colon   SKIN CANCER EXCISION  2007   [place]   ulner nerve  Left 06/2009   decompression    Family History  Problem Relation Age of Onset   Diabetes Mother    Brain cancer Mother    Diabetes Father    Heart attack Father    Heart disease Father    Diabetes Sister    Bipolar disorder Brother    Diabetes Brother    Heart disease Brother    Colon cancer Neg Hx    Breast cancer Neg Hx     Allergies as of 12/24/2021 - Review Complete 12/24/2021  Allergen Reaction Noted   Pramipexole Other (See Comments) 04/24/2015   Crestor [rosuvastatin calcium]  03/28/2019   Pollen extract Other (See  Comments) 04/24/2015    Social History   Socioeconomic History   Marital status: Divorced    Spouse name: Not on file   Number of children: 1   Years of education: 12   Highest education level: Not on file  Occupational History   Occupation: retired    Comment: employed at [employer] Use   Smoking status: Every Day    Packs/day: 0.50    Years: 43.00    Total pack years: 21.50    Types: Cigarettes   Smokeless tobacco: Never   Tobacco comments:    smoking since 71yr old.    Vaping Use   Vaping Use: Never used  Substance and Sexual Activity   Alcohol use: No    Alcohol/week: 0.0 standard drinks of alcohol   Drug use: No   Sexual activity: Not Currently  Other Topics Concern   Not on file  Social History Narrative   Lives alone.  Retired.  Education 12th grade.  One child.     Caffeine use- sodas, 2 daily   Social Determinants of Health   Financial Resource Strain: Not on file  Food Insecurity: Not on file  Transportation Needs: Not on file  Physical Activity: Not on file  Stress: Not on file  Social Connections: Not on file     Review of Systems   Gen: Denies fever, chills, anorexia. Denies fatigue, weakness, weight loss.  CV: Denies chest pain, palpitations, syncope, peripheral edema, and claudication. Resp: Denies dyspnea at rest, cough, wheezing, coughing up blood, and pleurisy. GI: See HPI Derm: Denies rash, itching, dry skin Psych: Denies depression, anxiety, memory loss, confusion. No homicidal or suicidal ideation.  Heme: Denies bruising, bleeding, and enlarged lymph nodes.   Physical Exam   BP 121/61 (BP Location: Right Arm, Patient Position: Sitting, Cuff Size: Normal)   Pulse 70   Temp 97.6 F (36.4 C) (Temporal)   Ht '5\' 6"'$  (1.676 m)   Wt 156 lb 9.6 oz (71 kg)   SpO2 94%   BMI 25.28 kg/m   General:   Alert and oriented. No distress noted. Pleasant and cooperative.  Head:  Normocephalic and atraumatic. Eyes:  Conjuctiva  clear without scleral icterus. Mouth:  Oral mucosa pink and moist. Good dentition. No lesions. Lungs:  Clear to auscultation bilaterally. No wheezes, rales, or  rhonchi. No distress.  Heart:  S1, S2 present without murmurs appreciated.  Abdomen:  +BS, soft, non-distended. TTP to lower abdomen and epigastric region. No rebound or guarding. No HSM or masses noted. Rectal: deferred Msk:  Symmetrical without gross deformities. Normal posture. Extremities:  Without edema. Neurologic:  Alert and  oriented x4 Psych:  Alert and cooperative. Normal mood and affect.   Assessment  MONTIA HASLIP is a 71 y.o. female with a history of bipolar disorder, essential tremors, anxiety, depression, GERD, HLD, diabetes, and NASH cirrhosis presenting today with complaint of LLQ abdominal pain.  Abdominal pain, LLQ, N/V: History of chronic left lower quadrant abdominal pain extending into the left flank.  Previously suspected to be functional however urological origin considered.  Suspected IBS is predominant cause of abdominal pain.  Currently maintained on omeprazole 40 mg daily, having occasional breakthrough symptoms about twice per week.  Also chronic nausea without vomiting.  Worsened if she overeats.  Has some initial weight loss but reported she came back to be pounds recently.  Able to eat and maintain weight.  Unable to tolerate dairy products and wheat, was given foods to avoid by her oncologist.  Advised to continue Zofran 4 mg 3 times per day as needed, refill given today.  Given recent trouble with constipation, and tenderness on exam today, will treat empirically with Cipro and Flagyl for possible diverticulitis, short course given.  Cirrhosis: Secondary to NASH. Had recent labs.  Renal ultrasound in April with cirrhotic liver morphology, mild prominent proximal duodenal walls.  CT scan prior to this with evidence of portal hypertension but no evidence of HCC.  EGD in March 2021 without evidence of  esophageal varices.  Currently due for screening as 2-year follow-up was recommended.  No evidence of hepatosplenomegaly on exam today.  Denies any abdominal distention, ascites, bleeding episodes including melena or BRBPR.  Denies jaundice or pruritus.  Has been experiencing some nausea as stated above.  Due for updated blood work and abdominal imaging for [facility] screening.  Will check complete abdominal ultrasound given her left-sided abdominal pain as well.  She is currently due for screening EGD, we will schedule this today.  IBS: Mixed constipation and diarrhea.  Still has occasional urgency resulting in occasional accidents.  Admits to sometimes having to strain to have a bowel movement.  Taking dicyclomine 10 mg once daily but denies it being helpful.  Suspect likely she is having predominant constipation as of recently.  Advised to use dicyclomine subdermally, stop Imodium, and start MiraLAX 17 g daily.  Also advised daily fiber supplementation.  PLAN   U/S abdomen CBC, CMP, INR, Lipase, TSH Zofran '4mg'$  TID as needed, refilled today.  Increase omeprazole to 40 mg twice daily. Proceed with upper endoscopy with propofol by Dr. Gala Romney in near future: the risks, benefits, and alternatives have been discussed with the patient in detail. The patient states understanding and desires to proceed.  ASA 2 Hold Rybelsus the day prior to and the morning of the procedure Cipro 500 mg BID and Flagyl 500 mg TID for 7 days.  Stop imodium Seldom use of dicyclomine Start miralax 17g daily.  Daily fiber supplement Follow up in 4-6 weeks.    Venetia Night, MSN, FNP-BC, AGACNP-BC [facility]

## 2021-12-24 ENCOUNTER — Encounter: Payer: Self-pay | Admitting: Gastroenterology

## 2021-12-24 ENCOUNTER — Encounter: Payer: Self-pay | Admitting: *Deleted

## 2021-12-24 ENCOUNTER — Ambulatory Visit (INDEPENDENT_AMBULATORY_CARE_PROVIDER_SITE_OTHER): Payer: Medicare Other | Admitting: Gastroenterology

## 2021-12-24 VITALS — BP 121/61 | HR 70 | Temp 97.6°F | Ht 66.0 in | Wt 156.6 lb

## 2021-12-24 DIAGNOSIS — K589 Irritable bowel syndrome without diarrhea: Secondary | ICD-10-CM

## 2021-12-24 DIAGNOSIS — K7460 Unspecified cirrhosis of liver: Secondary | ICD-10-CM

## 2021-12-24 DIAGNOSIS — K529 Noninfective gastroenteritis and colitis, unspecified: Secondary | ICD-10-CM | POA: Diagnosis not present

## 2021-12-24 DIAGNOSIS — R110 Nausea: Secondary | ICD-10-CM | POA: Diagnosis not present

## 2021-12-24 DIAGNOSIS — K7469 Other cirrhosis of liver: Secondary | ICD-10-CM | POA: Diagnosis not present

## 2021-12-24 MED ORDER — ONDANSETRON HCL 4 MG PO TABS: ORAL_TABLET | ORAL | 0 refills | Status: DC

## 2021-12-24 MED ORDER — CIPROFLOXACIN HCL 500 MG PO TABS: 500.0000 mg | ORAL_TABLET | Freq: Two times a day (BID) | ORAL | 0 refills | Status: AC

## 2021-12-24 MED ORDER — OMEPRAZOLE 40 MG PO CPDR: 40.0000 mg | DELAYED_RELEASE_CAPSULE | Freq: Two times a day (BID) | ORAL | 2 refills | Status: DC

## 2021-12-24 MED ORDER — METRONIDAZOLE 500 MG PO TABS: 500.0000 mg | ORAL_TABLET | Freq: Three times a day (TID) | ORAL | 0 refills | Status: DC

## 2021-12-24 NOTE — Patient Instructions (Addendum)
I want you to stop taking imodium and start taking miralax 17g daily, mixed in 8oz of liquid of your choice. You may use dicyclomine sparingly as needed for abdominal pain.  Also want you to begin using a daily fiber supplement, you may use Gummies.  I am refilling your zofran today and I want you to increase your omeprazole to twice daily briefly. Follow a GERD diet:  Avoid fried, fatty, greasy, spicy, citrus foods. Avoid caffeine and carbonated beverages. Avoid chocolate. Try eating 4-6 small meals a day rather than 3 large meals. Do not eat within 3 hours of laying down. Prop head of bed up on wood or bricks to create a 6 inch incline.  I am ordering an ultrasound of your abdomen to screen for hepatocellular carcinoma given your cirrhosis. I am also ordering labs for you have completed at [facility], this is your liver labs that you need every 6 months  We are also scheduling you for an upper endoscopy in the near future with Dr. Work to screen for esophageal varices and to further assess your nausea.   I am sending in Cipro and Flagyl for you to take for 1 week to cover you for possible diverticulitis.  If you do not have improvement in your abdominal pain within 3 to 5 days of being on antibiotics, please contact the office.  If pain begins to worsen, or you develop any fever, chills or frequent sweats I want you to head to the ED.  It was a pleasure to see you today. I want to create trusting relationships with patients. If you receive a survey regarding your visit,  I greatly appreciate you taking time to fill this out on paper or through your MyChart. I value your feedback.  Venetia Night, MSN, FNP-BC, AGACNP-BC [facility]

## 2021-12-25 LAB — COMPREHENSIVE METABOLIC PANEL
AG Ratio: 1.5 (calc) (ref 1.0–2.5)
ALT: 8 U/L (ref 6–29)
AST: 18 U/L (ref 10–35)
Albumin: 4.1 g/dL (ref 3.6–5.1)
Alkaline phosphatase (APISO): 106 U/L (ref 37–153)
BUN: 11 mg/dL (ref 7–25)
CO2: 25 mmol/L (ref 20–32)
Calcium: 9.3 mg/dL (ref 8.6–10.4)
Chloride: 109 mmol/L (ref 98–110)
Creat: 0.96 mg/dL (ref 0.60–1.00)
Globulin: 2.7 g/dL (calc) (ref 1.9–3.7)
Glucose, Bld: 89 mg/dL (ref 65–99)
Potassium: 4.5 mmol/L (ref 3.5–5.3)
Sodium: 141 mmol/L (ref 135–146)
Total Bilirubin: 0.7 mg/dL (ref 0.2–1.2)
Total Protein: 6.8 g/dL (ref 6.1–8.1)

## 2021-12-25 LAB — CBC
HCT: 37.4 % (ref 35.0–45.0)
Hemoglobin: 12.4 g/dL (ref 11.7–15.5)
MCH: 27.9 pg (ref 27.0–33.0)
MCHC: 33.2 g/dL (ref 32.0–36.0)
MCV: 84.2 fL (ref 80.0–100.0)
MPV: 11.6 fL (ref 7.5–12.5)
Platelets: 59 10*3/uL — ABNORMAL LOW (ref 140–400)
RBC: 4.44 10*6/uL (ref 3.80–5.10)
RDW: 14.3 % (ref 11.0–15.0)
WBC: 3.5 10*3/uL — ABNORMAL LOW (ref 3.8–10.8)

## 2021-12-25 LAB — LIPASE: Lipase: 17 U/L (ref 7–60)

## 2021-12-25 LAB — PROTIME-INR
INR: 1.1
Prothrombin Time: 11.4 s (ref 9.0–11.5)

## 2021-12-25 LAB — TSH: TSH: 1 mIU/L (ref 0.40–4.50)

## 2021-12-26 ENCOUNTER — Telehealth (INDEPENDENT_AMBULATORY_CARE_PROVIDER_SITE_OTHER): Payer: Medicare Other | Admitting: Psychiatry

## 2021-12-26 ENCOUNTER — Encounter (HOSPITAL_COMMUNITY): Payer: Self-pay | Admitting: Psychiatry

## 2021-12-26 DIAGNOSIS — F3162 Bipolar disorder, current episode mixed, moderate: Secondary | ICD-10-CM | POA: Diagnosis not present

## 2021-12-26 MED ORDER — DIAZEPAM 10 MG PO TABS: ORAL_TABLET | ORAL | 2 refills | Status: DC

## 2021-12-26 MED ORDER — BUPROPION HCL ER (XL) 150 MG PO TB24: 150.0000 mg | ORAL_TABLET | Freq: Every morning | ORAL | 2 refills | Status: DC

## 2021-12-26 MED ORDER — DIAZEPAM 5 MG PO TABS: 5.0000 mg | ORAL_TABLET | Freq: Two times a day (BID) | ORAL | 2 refills | Status: DC

## 2021-12-26 MED ORDER — PROPRANOLOL HCL ER 80 MG PO CP24: 80.0000 mg | ORAL_CAPSULE | Freq: Every day | ORAL | 2 refills | Status: DC

## 2021-12-26 NOTE — Progress Notes (Signed)
Virtual Visit via Telephone Note  I connected with Andrea Dickson on 12/26/21 at 11:20 AM EST by telephone and verified that I am speaking with the correct person using two identifiers.  Location: Patient: home Provider: home office   I discussed the limitations, risks, security and privacy concerns of performing an evaluation and management service by telephone and the availability of in person appointments. I also discussed with the patient that there may be a patient responsible charge related to this service. The patient expressed understanding and agreed to proceed.      I discussed the assessment and treatment plan with the patient. The patient was provided an opportunity to ask questions and all were answered. The patient agreed with the plan and demonstrated an understanding of the instructions.   The patient was advised to call back or seek an in-person evaluation if the symptoms worsen or if the condition fails to improve as anticipated.  I provided 15 minutes of non-face-to-face time during this encounter.   Levonne Spiller, MD  [facility] MD/PA/NP OP Progress Note  12/26/2021 11:17 AM Andrea Dickson  MRN:  [id number]  Chief Complaint:  Chief Complaint  Patient presents with   Anxiety   Depression   Follow-up   HPI: This patient is a 71 year old separated white female who lives alone in [place]. She has 1 son and 3 grandchildren. She worked as a Engineer, technical sales for [employer].   she was referred by her primary physician, Dr. Pleas Koch for further assessment and treatment of possible bipolar disorder.   Pt returns for F/u after 3 months.  She is having stomach issues and low platelets due to enlarged spleen. However, mood is stable and anxiety is well controlled. Denies SI Visit Diagnosis:    ICD-10-CM   1. Bipolar 1 disorder, mixed, moderate (HCC)  F31.62       Past Psychiatric History: past outpatient treatment Past Medical History:  Past Medical History:   Diagnosis Date   Anxiety    Asthma    Bipolar 1 disorder ([place])    Chronic diarrhea    Cirrhosis ([place])    Diagnosed September 2020, likely secondary to [facility], s/p Hep A/B vaccination in 2021.    Depression    Diabetes ([place])    Essential tremor    GERD (gastroesophageal reflux disease)    Headache    High cholesterol     Past Surgical History:  Procedure Laterality Date   ABDOMINAL HYSTERECTOMY  1997   BIOPSY  01/17/2018   Procedure: BIOPSY;  Surgeon: Daneil Dolin, MD;  [facility];  Service: Endoscopy;;  colon   carpal tunnel Bilateral 1999, 06/2009   COLONOSCOPY  2013   Dr. Britta Mccreedy: no colon polyps. quality of prep was fair. repeat colonoscopy in five years.    COLONOSCOPY WITH PROPOFOL N/A 01/17/2018   Dr. Gala Romney: Colonic lipoma, 2 tubular adenomas removed.  Random colon biopsies negative.  Next colonoscopy 5 years.   ESOPHAGOGASTRODUODENOSCOPY (EGD) WITH PROPOFOL N/A 04/06/2019   Procedure: ESOPHAGOGASTRODUODENOSCOPY (EGD) WITH PROPOFOL;  Surgeon: Daneil Dolin, MD;   Normal esophagus, mild portal hypertensive gastropathy, otherwise normal exam.  Recommend repeat EGD in 2 years for variceal screening.    POLYPECTOMY  01/17/2018   Procedure: POLYPECTOMY;  Surgeon: Daneil Dolin, MD;  Location: [facility];  Service: Endoscopy;;  colon   SKIN CANCER EXCISION  2007   TONSILLECTOMY  1965   ulner nerve  Left 06/2009   decompression  Family Psychiatric History: see below  Family History:  Family History  Problem Relation Age of Onset   Diabetes Mother    Brain cancer Mother    Diabetes Father    Heart attack Father    Heart disease Father    Diabetes Sister    Bipolar disorder Brother    Diabetes Brother    Heart disease Brother    Colon cancer Neg Hx    Breast cancer Neg Hx     Social History:  Social History   Socioeconomic History   Marital status: Divorced    Spouse name: Not on file   Number of children: 1   Years of education: 12   Highest  education level: Not on file  Occupational History   Occupation: retired    Comment: employed at [employer] Use   Smoking status: Every Day    Packs/day: 0.50    Years: 43.00    Total pack years: 21.50    Types: Cigarettes   Smokeless tobacco: Never   Tobacco comments:    smoking since 71yr old.    Vaping Use   Vaping Use: Never used  Substance and Sexual Activity   Alcohol use: No    Alcohol/week: 0.0 standard drinks of alcohol   Drug use: No   Sexual activity: Not Currently  Other Topics Concern   Not on file  Social History Narrative   Lives alone.  Retired.  Education 12th grade.  One child.     Caffeine use- sodas, 2 daily   Social Determinants of Health   Financial Resource Strain: Not on file  Food Insecurity: Not on file  Transportation Needs: Not on file  Physical Activity: Not on file  Stress: Not on file  Social Connections: Not on file    Allergies:  Allergies  Allergen Reactions   Pramipexole Other (See Comments)    Shaking, palpitations, headache, faint feeling   Crestor [Rosuvastatin Calcium]     Muscle pain   Pollen Extract Other (See Comments)    Eyes and nose run    Metabolic Disorder Labs: Lab Results  Component Value Date   HGBA1C 5.4 04/24/2015   MPG 108 04/24/2015   No results found for: "PROLACTIN" Lab Results  Component Value Date   CHOL 178 02/17/2016   TRIG 229 (H) 02/17/2016   HDL 30 (L) 02/17/2016   CHOLHDL 5.9 (H) 02/17/2016   VLDL 46 (H) 02/17/2016   LDLCALC 102 (H) 02/17/2016   LDLCALC 126 (H) 11/18/2015   Lab Results  Component Value Date   TSH 1.00 12/24/2021   TSH 2.96 08/29/2019    Therapeutic Level Labs: Lab Results  Component Value Date   LITHIUM 0.8 08/29/2019   LITHIUM 0.4 (L) 10/13/2018   Lab Results  Component Value Date   VALPROATE 62.2 08/20/2016   VALPROATE 83.0 10/14/2015   No results found for: "CBMZ"  Current Medications: Current Outpatient Medications  Medication Sig  Dispense Refill   acetaminophen (TYLENOL) 500 MG tablet Take 1,000 mg by mouth 2 (two) times daily as needed for moderate pain or headache.     buPROPion (WELLBUTRIN XL) 150 MG 24 hr tablet Take 1 tablet (150 mg total) by mouth every morning. 30 tablet 2   cetirizine (ZYRTEC) 10 MG tablet Take 10 mg by mouth daily.     Cholecalciferol (VITAMIN D3) 50 MCG (2000 UT) TABS Take 2,000 Units by mouth daily.      ciprofloxacin (CIPRO) 500 MG tablet Take  1 tablet (500 mg total) by mouth 2 (two) times daily for 7 days. 14 tablet 0   diazepam (VALIUM) 10 MG tablet TAKE ONE TABLET BY MOUTH EVERYDAY AT BEDTIME 60 tablet 2   diazepam (VALIUM) 5 MG tablet Take 1 tablet (5 mg total) by mouth 2 (two) times daily. 60 tablet 2   dicyclomine (BENTYL) 10 MG capsule Take 10 mg by mouth every 6 (six) hours as needed. (Patient not taking: Reported on 12/24/2021)     estrogens, conjugated, (PREMARIN) 1.25 MG tablet Take 1.25 mg by mouth daily.      ezetimibe (ZETIA) 10 MG tablet Take 10 mg by mouth daily.     Lactobacillus-Inulin ([place]) CAPS Take 1 capsule by mouth daily.     loperamide (IMODIUM) 2 MG capsule Take 2 mg by mouth as needed for diarrhea or loose stools.     metroNIDAZOLE (FLAGYL) 500 MG tablet Take 1 tablet (500 mg total) by mouth 3 (three) times daily. 20 tablet 0   omeprazole (PRILOSEC) 40 MG capsule Take 1 capsule (40 mg total) by mouth 2 (two) times daily. 60 capsule 2   ondansetron (ZOFRAN) 4 MG tablet TAKE 1 TABLET BY MOUTH EVERY 8 HOURS AS NEEDED FOR NAUSEA AND VOMITING 20 tablet 0   propranolol ER (INDERAL LA) 80 MG 24 hr capsule Take 1 capsule (80 mg total) by mouth daily. 30 capsule 2   RYBELSUS 3 MG TABS Take by mouth.     No current facility-administered medications for this visit.     Musculoskeletal: Strength & Muscle Tone: na Gait & Station: na Patient leans: N/A  Psychiatric Specialty Exam: Review of Systems  Constitutional:  Positive for appetite change.   Gastrointestinal:  Positive for diarrhea and nausea.  Neurological:  Positive for tremors.  All other systems reviewed and are negative.   There were no vitals taken for this visit.There is no height or weight on file to calculate BMI.  General Appearance: NA  Eye Contact:  NA  Speech:  Clear and Coherent  Volume:  Normal  Mood:  Euthymic  Affect:  NA  Thought Process:  Goal Directed  Orientation:  Full (Time, Place, and Person)  Thought Content: WDL   Suicidal Thoughts:  No  Homicidal Thoughts:  No  Memory:  Immediate;   Good Recent;   Good Remote;   Fair  Judgement:  Good  Insight:  Good  Psychomotor Activity:  Normal  Concentration:  Concentration: Good and Attention Span: Good  Recall:  Good  Fund of Knowledge: Good  Language: Good  Akathisia:  No  Handed:  Right  AIMS (if indicated): not done  Assets:  Communication Skills Desire for Improvement Resilience Social Support Talents/Skills  ADL's:  Intact  Cognition: WNL  Sleep:  Good   Screenings: PHQ2-9    Flowsheet Row Video Visit from 09/30/2021 in [place] Video Visit from 06/26/2021 in  [facility] Video Visit from 03/26/2021 in [place] Video Visit from 12/18/2020 in [place] Video Visit from 09/24/2020 in [place] [facility]  PHQ-2 Total Score 0 0 0 0 0      Flowsheet Row Video Visit from 09/30/2021 in [place] Video Visit from 06/26/2021 in [place] Video Visit from 03/26/2021 in [place] No Risk No Risk No Risk  Assessment and Plan: This patient is a 71 year old female with a history of bipolar disorder. She will continue Valium5 mg  twice daily and continue Valium 10 mg at bedtime for sleep. She will continue Wellbutrin XL 150 mg every morning for depression and Inderal LA 80 mg daily for tremor. She will return to see me in 3 months   Collaboration of Care: Collaboration of Care: Primary Care Provider AEB notes will be shared with PCP at pts request  Patient/Guardian was advised Release of Information must be obtained prior to any record release in order to collaborate their care with an outside provider. Patient/Guardian was advised if they have not already done so to contact the registration department to sign all necessary forms in order for [place] to release information regarding their care.   Consent: Patient/Guardian gives verbal consent for treatment and assignment of benefits for services provided during this visit. Patient/Guardian expressed understanding and agreed to proceed.    Levonne Spiller, MD 12/26/2021, 11:17 AM

## 2021-12-30 ENCOUNTER — Encounter: Payer: Self-pay | Admitting: *Deleted

## 2021-12-31 ENCOUNTER — Telehealth: Payer: Self-pay | Admitting: *Deleted

## 2021-12-31 NOTE — Telephone Encounter (Signed)
Pt called and states she has stopped taking MiraLAX, because it is making her have to many BM's and she has had a couple accidents.

## 2022-01-09 ENCOUNTER — Telehealth: Payer: Self-pay | Admitting: *Deleted

## 2022-01-09 NOTE — Telephone Encounter (Signed)
LMOVM to return call  us scheduled for 01/21/22 at 9:30 am, arrive at 9:15 am, nothing to eat or drink after midnight.

## 2022-01-09 NOTE — Telephone Encounter (Signed)
Pt informed of [place] date and time and instructions. Verbalized understanding.

## 2022-01-13 ENCOUNTER — Telehealth: Payer: Self-pay | Admitting: *Deleted

## 2022-01-13 NOTE — Telephone Encounter (Signed)
PA approved via [facility]. Auth# [id number], DOS: Jan 13, 2022 - Apr 13, 2022

## 2022-01-19 ENCOUNTER — Ambulatory Visit (HOSPITAL_BASED_OUTPATIENT_CLINIC_OR_DEPARTMENT_OTHER): Payer: Medicare Other | Admitting: Certified Registered Nurse Anesthetist

## 2022-01-19 ENCOUNTER — Encounter (HOSPITAL_COMMUNITY): Payer: Self-pay | Admitting: Internal Medicine

## 2022-01-19 ENCOUNTER — Encounter (HOSPITAL_COMMUNITY)
Admission: RE | Disposition: A | Discharge status: Home / Self Care | Payer: Self-pay | Source: Home / Self Care | Attending: Internal Medicine

## 2022-01-19 ENCOUNTER — Ambulatory Visit (HOSPITAL_COMMUNITY): Payer: Medicare Other | Admitting: Certified Registered Nurse Anesthetist

## 2022-01-19 ENCOUNTER — Ambulatory Visit (HOSPITAL_COMMUNITY)
Admission: RE | Admit: 2022-01-19 | Discharge: 2022-01-19 | Disposition: A | Discharge status: Home / Self Care | Payer: Medicare Other | Attending: Internal Medicine | Admitting: Internal Medicine

## 2022-01-19 ENCOUNTER — Other Ambulatory Visit: Payer: Self-pay

## 2022-01-19 DIAGNOSIS — G250 Essential tremor: Secondary | ICD-10-CM | POA: Insufficient documentation

## 2022-01-19 DIAGNOSIS — F319 Bipolar disorder, unspecified: Secondary | ICD-10-CM | POA: Diagnosis not present

## 2022-01-19 DIAGNOSIS — K766 Portal hypertension: Secondary | ICD-10-CM | POA: Diagnosis not present

## 2022-01-19 DIAGNOSIS — E785 Hyperlipidemia, unspecified: Secondary | ICD-10-CM | POA: Diagnosis not present

## 2022-01-19 DIAGNOSIS — I8510 Secondary esophageal varices without bleeding: Secondary | ICD-10-CM

## 2022-01-19 DIAGNOSIS — Z79899 Other long term (current) drug therapy: Secondary | ICD-10-CM | POA: Diagnosis not present

## 2022-01-19 DIAGNOSIS — K219 Gastro-esophageal reflux disease without esophagitis: Secondary | ICD-10-CM | POA: Insufficient documentation

## 2022-01-19 DIAGNOSIS — F17210 Nicotine dependence, cigarettes, uncomplicated: Secondary | ICD-10-CM | POA: Insufficient documentation

## 2022-01-19 DIAGNOSIS — J45909 Unspecified asthma, uncomplicated: Secondary | ICD-10-CM | POA: Diagnosis not present

## 2022-01-19 DIAGNOSIS — E119 Type 2 diabetes mellitus without complications: Secondary | ICD-10-CM | POA: Diagnosis not present

## 2022-01-19 DIAGNOSIS — F419 Anxiety disorder, unspecified: Secondary | ICD-10-CM | POA: Diagnosis not present

## 2022-01-19 DIAGNOSIS — K582 Mixed irritable bowel syndrome: Secondary | ICD-10-CM | POA: Diagnosis not present

## 2022-01-19 DIAGNOSIS — K3189 Other diseases of stomach and duodenum: Secondary | ICD-10-CM | POA: Diagnosis not present

## 2022-01-19 DIAGNOSIS — K7460 Unspecified cirrhosis of liver: Secondary | ICD-10-CM | POA: Diagnosis not present

## 2022-01-19 DIAGNOSIS — K7581 Nonalcoholic steatohepatitis (NASH): Secondary | ICD-10-CM | POA: Diagnosis not present

## 2022-01-19 DIAGNOSIS — Z87440 Personal history of urinary (tract) infections: Secondary | ICD-10-CM | POA: Diagnosis not present

## 2022-01-19 HISTORY — PX: ESOPHAGOGASTRODUODENOSCOPY (EGD) WITH PROPOFOL: SHX5813

## 2022-01-19 LAB — GLUCOSE, CAPILLARY: Glucose-Capillary: 110 mg/dL — ABNORMAL HIGH (ref 70–99)

## 2022-01-19 SURGERY — ESOPHAGOGASTRODUODENOSCOPY (EGD) WITH PROPOFOL
Anesthesia: General

## 2022-01-19 MED ORDER — LACTATED RINGERS IV SOLN: INTRAVENOUS | Status: DC

## 2022-01-19 MED ORDER — LACTATED RINGERS IV SOLN: INTRAVENOUS | Status: DC | PRN

## 2022-01-19 MED ORDER — PROPOFOL 10 MG/ML IV BOLUS
INTRAVENOUS | Status: DC | PRN
  Administered 2022-01-19: 80 mg via INTRAVENOUS

## 2022-01-19 MED ORDER — PROPOFOL 500 MG/50ML IV EMUL
INTRAVENOUS | Status: AC
  Filled 2022-01-19: qty 50

## 2022-01-19 MED ORDER — PROPOFOL 500 MG/50ML IV EMUL
INTRAVENOUS | Status: DC | PRN
  Administered 2022-01-19: 180 ug/kg/min via INTRAVENOUS

## 2022-01-19 MED ORDER — LIDOCAINE HCL (PF) 2 % IJ SOLN
INTRAMUSCULAR | Status: AC
  Filled 2022-01-19: qty 5

## 2022-01-19 MED ORDER — LIDOCAINE 2% (20 MG/ML) 5 ML SYRINGE
INTRAMUSCULAR | Status: DC | PRN
  Administered 2022-01-19: 70 mg via INTRAVENOUS

## 2022-01-19 NOTE — Discharge Instructions (Addendum)
EGD Discharge instructions Please read the instructions outlined below and refer to this sheet in the next few weeks. These discharge instructions provide you with general information on caring for yourself after you leave the hospital. Your doctor may also give you specific instructions. While your treatment has been planned according to the most current medical practices available, unavoidable complications occasionally occur. If you have any problems or questions after discharge, please call your doctor. ACTIVITY You may resume your regular activity but move at a slower pace for the next 24 hours.  Take frequent rest periods for the next 24 hours.  Walking will help expel (get rid of) the air and reduce the bloated feeling in your abdomen.  No driving for 24 hours (because of the anesthesia (medicine) used during the test).  You may shower.  Do not sign any important legal documents or operate any machinery for 24 hours (because of the anesthesia used during the test).  NUTRITION Drink plenty of fluids.  You may resume your normal diet.  Begin with a light meal and progress to your normal diet.  Avoid alcoholic beverages for 24 hours or as instructed by your caregiver.  MEDICATIONS You may resume your normal medications unless your caregiver tells you otherwise.  WHAT YOU CAN EXPECT TODAY You may experience abdominal discomfort such as a feeling of fullness or "gas" pains.  FOLLOW-UP Your doctor will discuss the results of your test with you.  SEEK IMMEDIATE MEDICAL ATTENTION IF ANY OF THE FOLLOWING OCCUR: Excessive nausea (feeling sick to your stomach) and/or vomiting.  Severe abdominal pain and distention (swelling).  Trouble swallowing.  Temperature over 101 F (37.8 C).  Rectal bleeding or vomiting of blood.     You do have varicose veins in your esophagus  Continue Inderal or propranolol daily  Keep your appointment on January 24  At patient request, I called Foye Spurling  at [phone]-reviewed findings and recommendations

## 2022-01-19 NOTE — Interval H&P Note (Signed)
History and Physical Interval Note:  01/19/2022 10:53 AM  Andrea Dickson  has presented today for surgery, with the diagnosis of abdominal pain,cirrhosis,nausea,IBS.  The various methods of treatment have been discussed with the patient and family. After consideration of risks, benefits and other options for treatment, the patient has consented to  Procedure(s) with comments: ESOPHAGOGASTRODUODENOSCOPY (EGD) WITH PROPOFOL (N/A) - 12:45 pm, pt knows to arrive at 9:15 as a surgical intervention.  The patient's history has been reviewed, patient examined, no change in status, stable for surgery.  I have reviewed the patient's chart and labs.  Questions were answered to the patient's satisfaction.     Andrea Dickson  No change.  No dysphagia.  Surveillance EGD per plan. The risks, benefits, limitations, alternatives and imponderables have been reviewed with the patient. Potential for esophageal dilation, biopsy, etc. have also been reviewed.  Questions have been answered. All parties agreeable.

## 2022-01-19 NOTE — Transfer of Care (Signed)
Immediate Anesthesia Transfer of Care Note  Patient: Andrea Dickson  Procedure(s) Performed: ESOPHAGOGASTRODUODENOSCOPY (EGD) WITH PROPOFOL  Patient Location: PACU  Anesthesia Type:General  Level of Consciousness: drowsy, patient cooperative, and responds to stimulation  Airway & Oxygen Therapy: Patient Spontanous Breathing  Post-op Assessment: Report given to RN, Post -op Vital signs reviewed and stable, Patient moving all extremities X 4, and Patient able to stick tongue midline  Post vital signs: Reviewed  Last Vitals:  Vitals Value Taken Time  BP 126/57 01/19/22 1112  Temp 36.4 C 01/19/22 1112  Pulse 73 01/19/22 1112  Resp 23 01/19/22 1112  SpO2 97 % 01/19/22 1112    Last Pain:  Vitals:   01/19/22 1112  TempSrc: Axillary  PainSc:       Patients Stated Pain Goal: 8 (91/66/06 0045)  Complications: No notable events documented.

## 2022-01-19 NOTE — Anesthesia Preprocedure Evaluation (Signed)
Anesthesia Evaluation  Patient identified by MRN, date of birth, ID band Patient awake    Reviewed: Allergy & Precautions, H&P , NPO status , Patient's Chart, lab work & pertinent test results, reviewed documented beta blocker date and time   Airway Mallampati: II  TM Distance: >3 FB Neck ROM: full    Dental no notable dental hx.    Pulmonary neg pulmonary ROS, asthma , Current Smoker   Pulmonary exam normal breath sounds clear to auscultation       Cardiovascular Exercise Tolerance: Good negative cardio ROS  Rhythm:regular Rate:Normal     Neuro/Psych  Headaches PSYCHIATRIC DISORDERS Anxiety Depression Bipolar Disorder   negative neurological ROS  negative psych ROS   GI/Hepatic negative GI ROS, Neg liver ROS,GERD  ,,  Endo/Other  negative endocrine ROSdiabetes, Type 2    Renal/GU negative Renal ROS  negative genitourinary   Musculoskeletal   Abdominal   Peds  Hematology negative hematology ROS (+)   Anesthesia Other Findings   Reproductive/Obstetrics negative OB ROS                             Anesthesia Physical Anesthesia Plan  ASA: 3  Anesthesia Plan: General   Post-op Pain Management:    Induction:   PONV Risk Score and Plan: Propofol infusion  Airway Management Planned:   Additional Equipment:   Intra-op Plan:   Post-operative Plan:   Informed Consent: I have reviewed the patients History and Physical, chart, labs and discussed the procedure including the risks, benefits and alternatives for the proposed anesthesia with the patient or authorized representative who has indicated his/her understanding and acceptance.     Dental Advisory Given  Plan Discussed with: CRNA  Anesthesia Plan Comments:        Anesthesia Quick Evaluation

## 2022-01-19 NOTE — Op Note (Signed)
[facility] Patient Name: Andrea Dickson Procedure Date: 01/19/2022 10:51 AM MRN: [id number] Date of Birth: [birth year] Attending MD: Norvel Richards , MD, [id number] CSN: [id number] Age: 72 Admit Type: Outpatient Procedure:                Upper GI endoscopy Indications:              Cirrhosis with suspected esophageal varices Providers:                Norvel Richards, MD, Charlsie Quest. Theda Sers RN, RN,                            Raphael Gibney, Technician Referring MD:              Medicines:                Propofol per Anesthesia Complications:            No immediate complications. Estimated Blood Loss:     Estimated blood loss was minimal. Procedure:                Pre-Anesthesia Assessment:                           - Prior to the procedure, a History and Physical                            was performed, and patient medications and                            allergies were reviewed. The patient's tolerance of                            previous anesthesia was also reviewed. The risks                            and benefits of the procedure and the sedation                            options and risks were discussed with the patient.                            All questions were answered, and informed consent                            was obtained. Prior Anticoagulants: The patient has                            taken no anticoagulant or antiplatelet agents. ASA                            Grade Assessment: III - A patient with severe                            systemic disease. After reviewing the risks and  benefits, the patient was deemed in satisfactory                            condition to undergo the procedure.                           After obtaining informed consent, the endoscope was                            passed under direct vision. Throughout the                            procedure, the patient's blood pressure, pulse, and                             oxygen saturations were monitored continuously. The                            [id number]-[id number] ([id number]) scope was introduced through the                            mouth, and advanced to the second part of duodenum.                            The upper GI endoscopy was accomplished without                            difficulty. The patient tolerated the procedure                            well. Scope In: 11:04:39 AM Scope Out: 11:08:01 AM Total Procedure Duration: 0 hours 3 minutes 22 seconds  Findings:      3 columns of grade 2 esophageal varices extending from the EG junction       proximally about 8 cm. One of the varix had a localized bulge putting it       into the grade 3 category. No bleeding stigmata. Gastric cavity empty.       Diffuse mucosal changes consistent with portal gastropathy. Touch       friability of the gastric mucosa observed. Patent pylorus. Examination       of bulb and second portion revealed no abnormalities Impression:               - 3 columns grade 2/limited grade 3 varices. Portal                            hypertensive gastropathy. Friable gastric mucosa.                           - No specimens collected. Moderate Sedation:      Moderate (conscious) sedation was personally administered by an       anesthesia professional. The following parameters were monitored: oxygen       saturation, heart rate, blood pressure, and response to care. Recommendation:           - Patient has a contact  number available for                            emergencies. The signs and symptoms of potential                            delayed complications were discussed with the                            patient. Return to normal activities tomorrow.                            Written discharge instructions were provided to the                            patient.                           - Advance diet as tolerated.                           - Continue present  medications. Continue Inderal                            daily. No repeat EGD needed so long as no evidence                            of upper GI bleeding. Keep office visit on January                            24. Procedure Code(s):        --- Professional ---                           [phone], Esophagogastroduodenoscopy, flexible,                            transoral; diagnostic, including collection of                            specimen(s) by brushing or washing, when performed                            (separate procedure) Diagnosis Code(s):        --- Professional ---                           K74.60, Unspecified cirrhosis of liver CPT copyright 2022 American Medical Association. All rights reserved. The codes documented in this report are preliminary and upon coder review may  be revised to meet current compliance requirements. Cristopher Estimable. Byran Bilotti, MD Norvel Richards, MD 01/19/2022 11:16:34 AM This report has been signed electronically. Number of Addenda: 0

## 2022-01-21 ENCOUNTER — Ambulatory Visit (HOSPITAL_COMMUNITY)
Admission: RE | Admit: 2022-01-21 | Discharge: 2022-01-21 | Disposition: A | Discharge status: Home / Self Care | Payer: Medicare Other | Source: Ambulatory Visit | Attending: Gastroenterology | Admitting: Gastroenterology

## 2022-01-21 DIAGNOSIS — K7460 Unspecified cirrhosis of liver: Secondary | ICD-10-CM | POA: Insufficient documentation

## 2022-01-21 NOTE — Anesthesia Postprocedure Evaluation (Signed)
Anesthesia Post Note  Patient: Andrea Dickson  Procedure(s) Performed: ESOPHAGOGASTRODUODENOSCOPY (EGD) WITH PROPOFOL  Patient location during evaluation: Phase II Anesthesia Type: General Level of consciousness: awake Pain management: pain level controlled Vital Signs Assessment: post-procedure vital signs reviewed and stable Respiratory status: spontaneous breathing and respiratory function stable Cardiovascular status: blood pressure returned to baseline and stable Postop Assessment: no headache and no apparent nausea or vomiting Anesthetic complications: no Comments: Late entry   No notable events documented.   Last Vitals:  Vitals:   01/19/22 1112 01/19/22 1117  BP: (!) 126/57 (!) 114/56  Pulse: 73 77  Resp: (!) 23 (!) 22  Temp: (!) 36.4 C   SpO2: 97% 97%    Last Pain:  Vitals:   01/19/22 1117  TempSrc:   PainSc: 0-No pain                 Louann Sjogren

## 2022-01-22 DIAGNOSIS — E1165 Type 2 diabetes mellitus with hyperglycemia: Secondary | ICD-10-CM | POA: Diagnosis not present

## 2022-01-22 DIAGNOSIS — E039 Hypothyroidism, unspecified: Secondary | ICD-10-CM | POA: Diagnosis not present

## 2022-01-22 DIAGNOSIS — K7460 Unspecified cirrhosis of liver: Secondary | ICD-10-CM | POA: Diagnosis not present

## 2022-01-22 DIAGNOSIS — E7849 Other hyperlipidemia: Secondary | ICD-10-CM | POA: Diagnosis not present

## 2022-01-22 DIAGNOSIS — N1831 Chronic kidney disease, stage 3a: Secondary | ICD-10-CM | POA: Diagnosis not present

## 2022-01-22 DIAGNOSIS — I10 Essential (primary) hypertension: Secondary | ICD-10-CM | POA: Diagnosis not present

## 2022-01-23 ENCOUNTER — Encounter (HOSPITAL_COMMUNITY): Payer: Self-pay | Admitting: Internal Medicine

## 2022-01-27 ENCOUNTER — Encounter: Payer: Self-pay | Admitting: *Deleted

## 2022-01-29 DIAGNOSIS — N1831 Chronic kidney disease, stage 3a: Secondary | ICD-10-CM | POA: Diagnosis not present

## 2022-01-29 DIAGNOSIS — E1169 Type 2 diabetes mellitus with other specified complication: Secondary | ICD-10-CM | POA: Diagnosis not present

## 2022-01-29 DIAGNOSIS — D696 Thrombocytopenia, unspecified: Secondary | ICD-10-CM | POA: Diagnosis not present

## 2022-01-29 DIAGNOSIS — R030 Elevated blood-pressure reading, without diagnosis of hypertension: Secondary | ICD-10-CM | POA: Diagnosis not present

## 2022-01-29 DIAGNOSIS — E7849 Other hyperlipidemia: Secondary | ICD-10-CM | POA: Diagnosis not present

## 2022-01-29 DIAGNOSIS — F17210 Nicotine dependence, cigarettes, uncomplicated: Secondary | ICD-10-CM | POA: Diagnosis not present

## 2022-01-29 DIAGNOSIS — N390 Urinary tract infection, site not specified: Secondary | ICD-10-CM | POA: Diagnosis not present

## 2022-01-29 DIAGNOSIS — K766 Portal hypertension: Secondary | ICD-10-CM | POA: Diagnosis not present

## 2022-01-29 DIAGNOSIS — R300 Dysuria: Secondary | ICD-10-CM | POA: Diagnosis not present

## 2022-01-29 DIAGNOSIS — K7460 Unspecified cirrhosis of liver: Secondary | ICD-10-CM | POA: Diagnosis not present

## 2022-01-29 DIAGNOSIS — K589 Irritable bowel syndrome without diarrhea: Secondary | ICD-10-CM | POA: Diagnosis not present

## 2022-02-02 ENCOUNTER — Other Ambulatory Visit: Payer: Self-pay | Admitting: Gastroenterology

## 2022-02-04 ENCOUNTER — Encounter: Payer: Self-pay | Admitting: Gastroenterology

## 2022-02-04 ENCOUNTER — Ambulatory Visit (INDEPENDENT_AMBULATORY_CARE_PROVIDER_SITE_OTHER): Payer: 59 | Admitting: Gastroenterology

## 2022-02-04 VITALS — BP 106/62 | HR 73 | Temp 96.9°F | Ht 66.0 in | Wt 151.0 lb

## 2022-02-04 DIAGNOSIS — R110 Nausea: Secondary | ICD-10-CM | POA: Diagnosis not present

## 2022-02-04 DIAGNOSIS — K580 Irritable bowel syndrome with diarrhea: Secondary | ICD-10-CM | POA: Diagnosis not present

## 2022-02-04 DIAGNOSIS — R1032 Left lower quadrant pain: Secondary | ICD-10-CM

## 2022-02-04 DIAGNOSIS — K7460 Unspecified cirrhosis of liver: Secondary | ICD-10-CM | POA: Diagnosis not present

## 2022-02-04 DIAGNOSIS — K219 Gastro-esophageal reflux disease without esophagitis: Secondary | ICD-10-CM

## 2022-02-04 MED ORDER — ONDANSETRON HCL 4 MG PO TABS: ORAL_TABLET | ORAL | 0 refills | Status: DC

## 2022-02-04 MED ORDER — RIFAXIMIN 550 MG PO TABS: 550.0000 mg | ORAL_TABLET | Freq: Three times a day (TID) | ORAL | 0 refills | Status: AC

## 2022-02-04 NOTE — Progress Notes (Signed)
GI Office Note    Referring Provider: Curlene Labrum, MD Primary Care Physician:  Curlene Labrum, MD Primary Gastroenterologist: Cristopher Estimable.Rourk, MD  Date:  02/04/2022  ID:  Andrea Dickson, DOB [birth year], MRN [id number]   Chief Complaint   Chief Complaint  Patient presents with   Follow-up    History of Present Illness  Andrea Dickson is a 72 y.o. female with a history of bipolar disorder, anxiety, depression, GERD, HLD, diabetes, NASH cirrhosis presenting today for follow-up.  Prior evaluation for cirrhosis with negative ANA, AMA, immunoglobulins, and hepatitis C.  Immune to hepatitis A and B.  Noted to be on dicyclomine for chronic loose stools.  Colonoscopy 01/17/2018: -2 polyps in the ascending colon (tubular adenoma) -segmental biopsy of colon (benign) -Colonic lipoma -Repeat TCS in 5 years   EGD 04/06/2019: -Normal esophagus -Portal hypertensive gastropathy -Normal duodenum -Advised repeat EGD in 2 years for screening.   Renal ultrasound 05/01/2021 with no hydronephrosis.  Cirrhotic liver morphology noted.  Mildly prominent proximal duodenal walls.  Last office visit 12/24/2021.  Patient presented with left lower quadrant abdominal pain up to her side and back.  Unable to eat dairy and wheat products.  Having constipation and diarrhea with some occasional urgency and couple of accidents.  At times has to strain to have a BM but not often.  Reported chronic nausea without vomiting however still able to eat.  Chronically has pain with overeating.  Takes 1 Imodium every day.  Taking Zofran sometimes up to twice per day but usually once per day.  States dicyclomine not helping.  Having occasional GERD breakthrough symptoms about twice per week.  Prescribe Cipro and Flagyl for possible diverticulitis.  Advised to increase omeprazole to 40 mg twice daily.  Continue Zofran up to 3 times daily as needed.  Labs and ultrasound ordered.  Advised to stop Imodium and use  dicyclomine seldomly.  Start MiraLAX 17 g daily for suspected constipation with overflow diarrhea.  Also advised to start fiber supplement.  Scheduled for upper endoscopy with Dr. Gala Romney.  Labs 12/24/2021: WBC 3.5, platelets 59, hemoglobin 12.4.  Creatinine 0.96, sodium 141, T. bili 0.7, normal LFTs.  INR 1.1.  Lipase 17, TSH 1.  (MELD Na: 7)  Patient called 1 week after last visit reporting she stopped MiraLAX due to loose stools.  EGD 01/19/2022: -3 columns of grade 2/limited grade 3 varices -Portal hypertensive gastropathy -Friable gastric mucosa -Continue Inderal daily -No repeat EGD needed as long as no evidence of upper GI bleed  Abdominal ultrasound 01/21/2022: -Cirrhosis with moderate splenomegaly -No focal liver lesion  Today: Did not start miralax or fiber because of her constant diarrhea. Quit dicyclomine. Taking imodium not and that is working. Sometimes takes 2 imodium, mostly takes 1 per day and that is in the morning. When it hurts it hurts very bad and feels like something is pushing out of her side. This is happening 2-3 times per week. Happens randomly and lasts for a a little whole. Has a fullness feeling. Taking an equate brand of probiotic. (Similar to culturelle). Has seen an improvement with that.   Admits to [place] food being a trigger for her.   Diarrhea is not quite as bad as before. Sometimes it happens when she eats and sometimes it does not. Will have 2-3 times when it happens. Has some incontinence and wears depends. Has had this for a long time.  Sometimes lower pain is so severe it doubles her over. Mostly  has bloating and LLQ pain associated with her diarrhea. Takes a nausea pill and sits down or lays down and then after 30-40 minutes it eases off.   Had a UTI and just finished Macrobid. Gets them frequently due to her incontinence.   Denies jaundice, pruritus, mental status changes, ascites, peripheral edema.   Current Outpatient Medications  Medication  Sig Dispense Refill   acetaminophen (TYLENOL) 500 MG tablet Take 1,000 mg by mouth 2 (two) times daily as needed for moderate pain or headache.     buPROPion (WELLBUTRIN XL) 150 MG 24 hr tablet Take 1 tablet (150 mg total) by mouth every morning. 30 tablet 2   cetirizine (ZYRTEC) 10 MG tablet Take 10 mg by mouth daily. Aller-Tec     Cholecalciferol (VITAMIN D3) 50 MCG (2000 UT) TABS Take 2,000 Units by mouth daily.      diazepam (VALIUM) 10 MG tablet TAKE ONE TABLET BY MOUTH EVERYDAY AT BEDTIME 60 tablet 2   diazepam (VALIUM) 5 MG tablet Take 1 tablet (5 mg total) by mouth 2 (two) times daily. (Patient taking differently: Take 5 mg by mouth daily as needed for anxiety (Depression).) 60 tablet 2   estrogens, conjugated, (PREMARIN) 1.25 MG tablet Take 1.25 mg by mouth daily.      ezetimibe (ZETIA) 10 MG tablet Take 10 mg by mouth daily.     fluticasone (FLONASE) 50 MCG/ACT nasal spray Place 2 sprays into both nostrils daily.     Lactobacillus-Inulin ([place]) CAPS Take 1 capsule by mouth daily.     omeprazole (PRILOSEC) 40 MG capsule Take 1 capsule (40 mg total) by mouth 2 (two) times daily. 60 capsule 2   ondansetron (ZOFRAN) 4 MG tablet TAKE ONE TABLET BY MOUTH EVERY 8 HOURS AS NEEDED FOR NAUSEA AND VOMITING 20 tablet 0   propranolol ER (INDERAL LA) 80 MG 24 hr capsule Take 1 capsule (80 mg total) by mouth daily. 30 capsule 2   dicyclomine (BENTYL) 10 MG capsule Take 10 mg by mouth every 6 (six) hours as needed (Abdominal pain). (Patient not taking: Reported on 02/04/2022)     loperamide (IMODIUM) 2 MG capsule Take 2 mg by mouth daily as needed for diarrhea or loose stools. (Patient not taking: Reported on 02/04/2022)     Semaglutide 7 MG TABS Take 7 mg by mouth daily. (Patient not taking: Reported on 02/04/2022)     No current facility-administered medications for this visit.    Past Medical History:  Diagnosis Date   Anxiety    Asthma    Bipolar 1 disorder ([place])     Chronic diarrhea    Cirrhosis ([place])    Diagnosed September 2020, likely secondary to [facility], s/p Hep A/B vaccination in 2021.    Depression    Diabetes ([place])    Essential tremor    GERD (gastroesophageal reflux disease)    Headache    High cholesterol     Past Surgical History:  Procedure Laterality Date   ABDOMINAL HYSTERECTOMY  1997   BIOPSY  01/17/2018   Procedure: BIOPSY;  Surgeon: Daneil Dolin, MD;  [facility];  Service: Endoscopy;;  colon   carpal tunnel Bilateral 1999, 06/2009   COLONOSCOPY  2013   Dr. Britta Mccreedy: no colon polyps. quality of prep was fair. repeat colonoscopy in five years.    COLONOSCOPY WITH PROPOFOL N/A 01/17/2018   Dr. Gala Romney: Colonic lipoma, 2 tubular adenomas removed.  Random colon biopsies negative.  Next colonoscopy 5 years.  ESOPHAGOGASTRODUODENOSCOPY (EGD) WITH PROPOFOL N/A 04/06/2019   Procedure: ESOPHAGOGASTRODUODENOSCOPY (EGD) WITH PROPOFOL;  Surgeon: Daneil Dolin, MD;   Normal esophagus, mild portal hypertensive gastropathy, otherwise normal exam.  Recommend repeat EGD in 2 years for variceal screening.    ESOPHAGOGASTRODUODENOSCOPY (EGD) WITH PROPOFOL N/A 01/19/2022   Procedure: ESOPHAGOGASTRODUODENOSCOPY (EGD) WITH PROPOFOL;  Surgeon: Daneil Dolin, MD;  [facility];  Service: Endoscopy;  Laterality: N/A;  12:45 pm, pt knows to arrive at 9:15   POLYPECTOMY  01/17/2018   Procedure: POLYPECTOMY;  Surgeon: Daneil Dolin, MD;  Location: [facility];  Service: Endoscopy;;  colon   SKIN CANCER EXCISION  2007   [address]   ulner nerve  Left 06/2009   decompression    Family History  Problem Relation Age of Onset   Diabetes Mother    Brain cancer Mother    Diabetes Father    Heart attack Father    Heart disease Father    Diabetes Sister    Bipolar disorder Brother    Diabetes Brother    Heart disease Brother    Colon cancer Neg Hx    Breast cancer Neg Hx     Allergies as of 02/04/2022 - Review Complete  02/04/2022  Allergen Reaction Noted   Pramipexole Other (See Comments) 04/24/2015   Crestor [rosuvastatin calcium]  03/28/2019   Bee pollen Other (See Comments) 04/24/2015   Pollen extract Other (See Comments) 04/24/2015    Social History   Socioeconomic History   Marital status: Divorced    Spouse name: Not on file   Number of children: 1   Years of education: 12   Highest education level: Not on file  Occupational History   Occupation: retired    Comment: employed at [employer] Use   Smoking status: Every Day    Packs/day: 0.50    Years: 43.00    Total pack years: 21.50    Types: Cigarettes   Smokeless tobacco: Never   Tobacco comments:    smoking since 72yr old.    Vaping Use   Vaping Use: Never used  Substance and Sexual Activity   Alcohol use: No    Alcohol/week: 0.0 standard drinks of alcohol   Drug use: No   Sexual activity: Not Currently  Other Topics Concern   Not on file  Social History Narrative   Lives alone.  Retired.  Education 12th grade.  One child.     Caffeine use- sodas, 2 daily   Social Determinants of Health   Financial Resource Strain: Not on file  Food Insecurity: Not on file  Transportation Needs: Not on file  Physical Activity: Not on file  Stress: Not on file  Social Connections: Not on file     Review of Systems   Gen: Denies fever, chills, anorexia. Denies fatigue, weakness, weight loss.  CV: Denies chest pain, palpitations, syncope, peripheral edema, and claudication. Resp: Denies dyspnea at rest, cough, wheezing, coughing up blood, and pleurisy. GI: See HPI Derm: Denies rash, itching, dry skin Psych: Denies depression, anxiety, memory loss, confusion. No homicidal or suicidal ideation.  Heme: Denies bruising, bleeding, and enlarged lymph nodes.   Physical Exam   BP 106/62 (BP Location: Right Arm, Patient Position: Sitting, Cuff Size: Normal)   Pulse 73   Temp (!) 96.9 F (36.1 C) (Temporal)   Ht '5\' 6"'$   (1.676 m)   Wt 151 lb (68.5 kg)   SpO2 99%   BMI 24.37 kg/m  General:   Alert and oriented. No distress noted. Pleasant and cooperative.  Head:  Normocephalic and atraumatic. Eyes:  Conjuctiva clear without scleral icterus. Mouth:  Oral mucosa pink and moist. Good dentition. No lesions. Lungs:  Clear to auscultation bilaterally. No wheezes, rales, or rhonchi. No distress.  Heart:  S1, S2 present without murmurs appreciated.  Abdomen:  +BS, soft, non-distended. Mild ttp to LLQ. No rebound or guarding. No HSM or masses noted. Rectal: deferred Msk:  Symmetrical without gross deformities. Normal posture. Extremities:  Without edema. Neurologic:  Alert and  oriented x4 Psych:  Alert and cooperative. Normal mood and affect.   Assessment  Andrea Dickson is a 72 y.o. female with a history of bipolar disorder, anxiety, depression, GERD, HLD, diabetes, chronic loose stool, NASH cirrhosis presenting today for follow-up.  Cirrhosis: Secondary to [facility].  Prior imaging with evidence of portal hypertension.  EGD in March 2021 without evidence of esophageal varices.  Recent EGD 01/19/2022 with grade 2/grade 3 varices and portal hypertensive gastropathy with friable mucosa.  Currently on propranolol 80 mg daily for tremors, this will be sufficient enough for esophageal variceal prophylaxis. Denies any jaundice, pruritus, mental status change, ascites or peripheral edema.  Continues to have some nausea and reflux as stated below.  Labs and ultrasound recently updated.  Ultrasound 01/21/2021 with cirrhosis and no evidence of hepatoma.  Recent labs 12/24/21 with platelets 59, normal LFTs.  MELD Na 7.  Only plan for repeat EGD if signs of bleeding.  Will be due for [facility] screening in 6 months along with MELD labs.  She continues to follow with oncology regarding her platelets.  IBS diarrhea, left lower quadrant abdominal pain: Previously suspected to be mixed constipation and diarrhea however patient states that  her primary issue is diarrhea associated with incontinence and urgency.  Has very loose stools multiple days per week with 2-3 episodes per day, previously was going every day however she started taking Imodium every day which has been improving her frequency however continues to have some urgency, incontinence, and left lower quadrant abdominal pain that occurs 2-3 times per week where she is doubled over and has a fullness feeling.  Sometimes pain is relieved after taking Zofran.  Also patient complains of lots of flatulence.  Suspect that left lower quadrant pain may be secondary to increased gas production therefore I have advised patient to try Gas-X, Beano, or Phazyme when this occurs rather than taking Zofran.  Previously treated with Cipro and Flagyl for suspected diverticulitis however patient continues to have symptoms and has some mild tenderness on exam.  Advised her to continue daily probiotic.  GERD, Nausea: Taking omeprazole 40 mg twice daily without complaint of any breakthrough symptoms.  Continues to have some intermittent nausea however states this is been improved since prior.  Somewhat may be exacerbated by dietary indiscretion as although she admits to [place] food being a trigger for her she continues to eat it.  Currently is taking a daily probiotic which also seems to be helping with her nausea.  Has still been avoiding dairy products and wheat.  Advised to follow GERD diet and avoid her dietary triggers.  Will continue on omeprazole 40 mg daily.  PLAN   Continue propanalol 80 mg daily.  Continue Zofran 4 mg TID as needed. Refilled today.  Continue imodium daily, hold while on trial of Xifaxin.   Trial Xifaxin 550 mg TID for 2 weeks.  Gas ex, beano or Phazyme as needed for LLQ pain.  Continue omeprazole 40 mg twice daily.  GERD diet High-protein diet from a primarily plant-based diet. Avoid red meat.  No raw or undercooked meat, seafood, or  shellfish Low-fat/cholesterol/carbohydrate diet. Limit sodium to no more than 2000 mg/day including everything that you eat and drink Recommend at least 30 minutes of aerobic and resistance exercise 3 days/week. Follow up in 6 months.     Venetia Night, MSN, FNP-BC, AGACNP-BC [facility]

## 2022-02-04 NOTE — Patient Instructions (Addendum)
Nutrition:  High-protein diet from a primarily plant-based diet. Avoid red meat.  No raw or undercooked meat, seafood, or shellfish. Low-fat/cholesterol/carbohydrate diet. Limit sodium to no more than 2000 mg/day including everything that you eat and drink. Recommend at least 30 minutes of aerobic and resistance exercise 3 days/week.   I am sending in [place]alt for you to trial.  You will take 1 tablet 3 times daily for 2 weeks. Continue imodium daily. Hold when starting Xifaxin.   You may use Gas-X, Beano, or Phazyme as needed for gas pains or whenever you are having left lower quadrant pain.  Continue Zofran 4 mg up to 3 times a day as needed.  Refill sent in for you today.  Continue propranolol.   Monitor for any bleeding including black/tarry stools, or vomiting blood.  Please call immediately or proceed to the ED if this occurs.  Follow-up in 6 months, or sooner if needed.  It was a pleasure to see you today. I want to create trusting relationships with patients. If you receive a survey regarding your visit,  I greatly appreciate you taking time to fill this out on paper or through your MyChart. I value your feedback.  Venetia Night, MSN, FNP-BC, AGACNP-BC [facility]

## 2022-02-11 DIAGNOSIS — K219 Gastro-esophageal reflux disease without esophagitis: Secondary | ICD-10-CM | POA: Diagnosis not present

## 2022-02-11 DIAGNOSIS — E782 Mixed hyperlipidemia: Secondary | ICD-10-CM | POA: Diagnosis not present

## 2022-02-11 DIAGNOSIS — E1169 Type 2 diabetes mellitus with other specified complication: Secondary | ICD-10-CM | POA: Diagnosis not present

## 2022-02-17 ENCOUNTER — Telehealth: Payer: Self-pay | Admitting: *Deleted

## 2022-02-17 NOTE — Telephone Encounter (Signed)
Pt called and states she has had diarrhea for the last couple of days. She states that Xifaxin worked last week, but this week it has not helped. She is having stomach aches and cramping.

## 2022-02-18 ENCOUNTER — Other Ambulatory Visit: Payer: Self-pay | Admitting: Gastroenterology

## 2022-02-18 NOTE — Telephone Encounter (Signed)
Spoke to pt, informed her of recommendations. Pt voiced understanding. Pt states stomach hurts all the time and she still has diarrhea. She states she is not eating anything with lactose. Would like to know what can be done for her stomach aches? Please advise.

## 2022-02-19 NOTE — Telephone Encounter (Signed)
LMOM for pt to call office  

## 2022-02-27 NOTE — Telephone Encounter (Signed)
Spoke to pt, informed her of recommendations. Pt voiced understanding. Pt states she is having diarrhea all the time. She has had several accidents. Pt states she has not been taking the dicyclomine. I informed her to start taking the medications and call the beginning of the week with an update.

## 2022-03-13 ENCOUNTER — Other Ambulatory Visit: Payer: Self-pay | Admitting: Gastroenterology

## 2022-03-18 ENCOUNTER — Ambulatory Visit (INDEPENDENT_AMBULATORY_CARE_PROVIDER_SITE_OTHER): Payer: 59 | Admitting: Urology

## 2022-03-18 ENCOUNTER — Encounter: Payer: Self-pay | Admitting: Urology

## 2022-03-18 VITALS — BP 114/65 | HR 69

## 2022-03-18 DIAGNOSIS — R350 Frequency of micturition: Secondary | ICD-10-CM | POA: Diagnosis not present

## 2022-03-18 DIAGNOSIS — N390 Urinary tract infection, site not specified: Secondary | ICD-10-CM | POA: Diagnosis not present

## 2022-03-18 DIAGNOSIS — R300 Dysuria: Secondary | ICD-10-CM

## 2022-03-18 DIAGNOSIS — Z87440 Personal history of urinary (tract) infections: Secondary | ICD-10-CM

## 2022-03-18 MED ORDER — NITROFURANTOIN MONOHYD MACRO 100 MG PO CAPS: 100.0000 mg | ORAL_CAPSULE | Freq: Two times a day (BID) | ORAL | 0 refills | Status: DC

## 2022-03-18 MED ORDER — NITROFURANTOIN MACROCRYSTAL 50 MG PO CAPS: 50.0000 mg | ORAL_CAPSULE | Freq: Every day | ORAL | 11 refills | Status: DC

## 2022-03-18 NOTE — Patient Instructions (Signed)
Urinary Tract Infection, Adult  A urinary tract infection (UTI) is an infection of any part of the urinary tract. The urinary tract includes the kidneys, ureters, bladder, and urethra. These organs make, store, and get rid of urine in the body. An upper UTI affects the ureters and kidneys. A lower UTI affects the bladder and urethra. What are the causes? Most urinary tract infections are caused by bacteria in your genital area around your urethra, where urine leaves your body. These bacteria grow and cause inflammation of your urinary tract. What increases the risk? You are more likely to develop this condition if: You have a urinary catheter that stays in place. You are not able to control when you urinate or have a bowel movement (incontinence). You are female and you: Use a spermicide or diaphragm for birth control. Have low estrogen levels. Are pregnant. You have certain genes that increase your risk. You are sexually active. You take antibiotic medicines. You have a condition that causes your flow of urine to slow down, such as: An enlarged prostate, if you are female. Blockage in your urethra. A kidney stone. A nerve condition that affects your bladder control (neurogenic bladder). Not getting enough to drink, or not urinating often. You have certain medical conditions, such as: Diabetes. A weak disease-fighting system (immunesystem). Sickle cell disease. Gout. Spinal cord injury. What are the signs or symptoms? Symptoms of this condition include: Needing to urinate right away (urgency). Frequent urination. This may include small amounts of urine each time you urinate. Pain or burning with urination. Blood in the urine. Urine that smells bad or unusual. Trouble urinating. Cloudy urine. Vaginal discharge, if you are female. Pain in the abdomen or the lower back. You may also have: Vomiting or a decreased appetite. Confusion. Irritability or tiredness. A fever or  chills. Diarrhea. The first symptom in older adults may be confusion. In some cases, they may not have any symptoms until the infection has worsened. How is this diagnosed? This condition is diagnosed based on your medical history and a physical exam. You may also have other tests, including: Urine tests. Blood tests. Tests for STIs (sexually transmitted infections). If you have had more than one UTI, a cystoscopy or imaging studies may be done to determine the cause of the infections. How is this treated? Treatment for this condition includes: Antibiotic medicine. Over-the-counter medicines to treat discomfort. Drinking enough water to stay hydrated. If you have frequent infections or have other conditions such as a kidney stone, you may need to see a health care provider who specializes in the urinary tract (urologist). In rare cases, urinary tract infections can cause sepsis. Sepsis is a life-threatening condition that occurs when the body responds to an infection. Sepsis is treated in the hospital with IV antibiotics, fluids, and other medicines. Follow these instructions at home:  Medicines Take over-the-counter and prescription medicines only as told by your health care provider. If you were prescribed an antibiotic medicine, take it as told by your health care provider. Do not stop using the antibiotic even if you start to feel better. General instructions Make sure you: Empty your bladder often and completely. Do not hold urine for long periods of time. Empty your bladder after sex. Wipe from front to back after urinating or having a bowel movement if you are female. Use each tissue only one time when you wipe. Drink enough fluid to keep your urine pale yellow. Keep all follow-up visits. This is important. Contact a health   care provider if: Your symptoms do not get better after 1-2 days. Your symptoms go away and then return. Get help right away if: You have severe pain in  your back or your lower abdomen. You have a fever or chills. You have nausea or vomiting. Summary A urinary tract infection (UTI) is an infection of any part of the urinary tract, which includes the kidneys, ureters, bladder, and urethra. Most urinary tract infections are caused by bacteria in your genital area. Treatment for this condition often includes antibiotic medicines. If you were prescribed an antibiotic medicine, take it as told by your health care provider. Do not stop using the antibiotic even if you start to feel better. Keep all follow-up visits. This is important. This information is not intended to replace advice given to you by your health care provider. Make sure you discuss any questions you have with your health care provider. Document Revised: 08/11/2019 Document Reviewed: 08/11/2019 Elsevier Patient Education  2023 [employer]  

## 2022-03-18 NOTE — Progress Notes (Signed)
03/18/2022 2:47 PM   Andrea Dickson Nov 30, 1950 [id number]  Referring provider: Curlene Labrum, MD [place],  [place] [address]  Recurrent UTI   HPI: Andrea Dickson is a 72yo here for followup for recurrent UTI. She has had 2 UTIs in the past 2 months. She was previously on macrodantin prophylaxis but this was stopped. UA today is concerning for infection. She has intermittent dysuria and urinary frequency. No other complaints today  PMH: Past Medical History:  Diagnosis Date   Anxiety    Asthma    Bipolar 1 disorder ([place])    Chronic diarrhea    Cirrhosis ([place])    Diagnosed September 2020, likely secondary to [facility], s/p Hep A/B vaccination in 2021.    Depression    Diabetes ([place])    Essential tremor    GERD (gastroesophageal reflux disease)    Headache    High cholesterol     Surgical History: Past Surgical History:  Procedure Laterality Date   ABDOMINAL HYSTERECTOMY  1997   BIOPSY  01/17/2018   Procedure: BIOPSY;  Surgeon: Daneil Dolin, MD;  [facility];  Service: Endoscopy;;  colon   carpal tunnel Bilateral 1999, 06/2009   COLONOSCOPY  2013   Dr. Britta Mccreedy: no colon polyps. quality of prep was fair. repeat colonoscopy in five years.    COLONOSCOPY WITH PROPOFOL N/A 01/17/2018   Dr. Gala Romney: Colonic lipoma, 2 tubular adenomas removed.  Random colon biopsies negative.  Next colonoscopy 5 years.   ESOPHAGOGASTRODUODENOSCOPY (EGD) WITH PROPOFOL N/A 04/06/2019   Procedure: ESOPHAGOGASTRODUODENOSCOPY (EGD) WITH PROPOFOL;  Surgeon: Daneil Dolin, MD;   Normal esophagus, mild portal hypertensive gastropathy, otherwise normal exam.  Recommend repeat EGD in 2 years for variceal screening.    ESOPHAGOGASTRODUODENOSCOPY (EGD) WITH PROPOFOL N/A 01/19/2022   Procedure: ESOPHAGOGASTRODUODENOSCOPY (EGD) WITH PROPOFOL;  Surgeon: Daneil Dolin, MD;  [facility];  Service: Endoscopy;  Laterality: N/A;  12:45 pm, pt knows to arrive at 9:15   POLYPECTOMY   01/17/2018   Procedure: POLYPECTOMY;  Surgeon: Daneil Dolin, MD;  [facility];  Service: Endoscopy;;  colon   SKIN CANCER EXCISION  2007   TONSILLECTOMY  1965   ulner nerve  Left 06/2009   decompression    Home Medications:  Allergies as of 03/18/2022       Reactions   Pramipexole Other (See Comments)   Shaking, palpitations, headache, faint feeling   Crestor [rosuvastatin Calcium]    Muscle pain   Bee Pollen Other (See Comments)   Eyes and nose run   Pollen Extract Other (See Comments)   Eyes and nose run        Medication List        Accurate as of March 18, 2022  2:47 PM. If you have any questions, ask your nurse or doctor.          acetaminophen 500 MG tablet Commonly known as: TYLENOL Take 1,000 mg by mouth 2 (two) times daily as needed for moderate pain or headache.   buPROPion 150 MG 24 hr tablet Commonly known as: WELLBUTRIN XL Take 1 tablet (150 mg total) by mouth every morning.   cetirizine 10 MG tablet Commonly known as: ZYRTEC Take 10 mg by mouth daily. Aller-Tec   Culturelle Digestive Health Caps Take 1 capsule by mouth daily.   diazepam 10 MG tablet Commonly known as: VALIUM TAKE ONE TABLET BY MOUTH EVERYDAY AT BEDTIME What changed: Another medication with  the same name was changed. Make sure you understand how and when to take each.   diazepam 5 MG tablet Commonly known as: Valium Take 1 tablet (5 mg total) by mouth 2 (two) times daily. What changed:  when to take this reasons to take this   estrogens (conjugated) 1.25 MG tablet Commonly known as: PREMARIN Take 1.25 mg by mouth daily.   ezetimibe 10 MG tablet Commonly known as: ZETIA Take 10 mg by mouth daily.   fluticasone 50 MCG/ACT nasal spray Commonly known as: FLONASE Place 2 sprays into both nostrils daily.   loperamide 2 MG capsule Commonly known as: IMODIUM Take 2 mg by mouth daily as needed for diarrhea or loose stools.   omeprazole 40 MG capsule Commonly  known as: PRILOSEC TAKE ONE CAPSULE BY MOUTH AT BREAKFAST AND AT BEDTIME   ondansetron 4 MG tablet Commonly known as: ZOFRAN TAKE ONE TABLET BY MOUTH EVERY 8 HOURS AS NEEDED FOR NAUSEA AND VOMITING   propranolol ER 80 MG 24 hr capsule Commonly known as: Inderal LA Take 1 capsule (80 mg total) by mouth daily.   Vitamin D3 50 MCG (2000 UT) Tabs Generic drug: Cholecalciferol Take 2,000 Units by mouth daily.        Allergies:  Allergies  Allergen Reactions   Pramipexole Other (See Comments)    Shaking, palpitations, headache, faint feeling   Crestor [Rosuvastatin Calcium]     Muscle pain   Bee Pollen Other (See Comments)    Eyes and nose run   Pollen Extract Other (See Comments)    Eyes and nose run    Family History: Family History  Problem Relation Age of Onset   Diabetes Mother    Brain cancer Mother    Diabetes Father    Heart attack Father    Heart disease Father    Diabetes Sister    Bipolar disorder Brother    Diabetes Brother    Heart disease Brother    Colon cancer Neg Hx    Breast cancer Neg Hx     Social History:  reports that she has been smoking cigarettes. She has a 21.50 pack-year smoking history. She has never used smokeless tobacco. She reports that she does not drink alcohol and does not use drugs.  ROS: All other review of systems were reviewed and are negative except what is noted above in HPI  Physical Exam: BP 114/65   Pulse 69   Constitutional:  Alert and oriented, No acute distress. HEENT: [place] AT, moist mucus membranes.  Trachea midline, no masses. Cardiovascular: No clubbing, cyanosis, or edema. Respiratory: Normal respiratory effort, no increased work of breathing. GI: Abdomen is soft, nontender, nondistended, no abdominal masses GU: No CVA tenderness.  Lymph: No cervical or inguinal lymphadenopathy. Skin: No rashes, bruises or suspicious lesions. Neurologic: Grossly intact, no focal deficits, moving all 4 extremities. Psychiatric:  Normal mood and affect.  Laboratory Data: Lab Results  Component Value Date   WBC 3.5 (L) 12/24/2021   HGB 12.4 12/24/2021   HCT 37.4 12/24/2021   MCV 84.2 12/24/2021   PLT 59 (L) 12/24/2021    Lab Results  Component Value Date   CREATININE 0.96 12/24/2021    No results found for: "PSA"  No results found for: "TESTOSTERONE"  Lab Results  Component Value Date   HGBA1C 5.4 04/24/2015    Urinalysis    Component Value Date/Time   APPEARANCEUR Clear 06/18/2021 1411   GLUCOSEU Negative 06/18/2021 1411   BILIRUBINUR Negative 06/18/2021  1411   PROTEINUR Negative 06/18/2021 1411   UROBILINOGEN 1.0 10/08/2015 1347   NITRITE Negative 06/18/2021 1411   LEUKOCYTESUR Negative 06/18/2021 1411    Lab Results  Component Value Date   LABMICR Comment 06/18/2021   WBCUA 6-10 (A) 05/08/2021   LABEPIT 0-10 05/08/2021   BACTERIA Few 05/08/2021    Pertinent Imaging:  No results found for this or any previous visit.  No results found for this or any previous visit.  No results found for this or any previous visit.  No results found for this or any previous visit.  Results for orders placed during the hospital encounter of 05/01/21  Ultrasound renal complete  Narrative CLINICAL DATA:  left hydronephrosis  EXAM: RENAL / URINARY TRACT ULTRASOUND COMPLETE  COMPARISON:  March 05, 2021 abdominal CT  FINDINGS: Right Kidney:  Renal measurements: 10.2 x 4.5 x 5.2 cm = volume: 125 mL. Echogenicity within normal limits. No mass or hydronephrosis visualized.  Left Kidney:  Renal measurements: 11.0 x 4.3 x 5.1 cm = volume: 120 thick mL. Echogenicity within normal limits. No mass or hydronephrosis visualized.  Bladder:  Appears normal for degree of bladder distention.  Other:  Increased echogenicity of the liver with a nodular contour consistent with history of cirrhosis. Mildly prominent proximal duodenal walls on still frame image 23/24.  IMPRESSION: 1. No  hydronephrosis. 2. Cirrhotic liver morphology.   Electronically Signed By: Valentino Saxon M.D. On: 05/02/2021 16:34  No valid procedures specified. No results found for this or any previous visit.  No results found for this or any previous visit.   Assessment & Plan:    1. Recurrent UTI -urine for culture, will call with results - Urinalysis, Routine w reflex microscopic  2. Burning with urination -likely related to UTI   No follow-ups on file.  Nicolette Bang, MD  [facility] [place]

## 2022-03-19 LAB — URINALYSIS, ROUTINE W REFLEX MICROSCOPIC
Bilirubin, UA: NEGATIVE
Glucose, UA: NEGATIVE
Ketones, UA: NEGATIVE
Nitrite, UA: NEGATIVE
Specific Gravity, UA: 1.02 (ref 1.005–1.030)
Urobilinogen, Ur: 2 mg/dL — ABNORMAL HIGH (ref 0.2–1.0)
pH, UA: 6.5 (ref 5.0–7.5)

## 2022-03-19 LAB — MICROSCOPIC EXAMINATION: WBC, UA: >30 /hpf — AB (ref 0–5)

## 2022-03-20 ENCOUNTER — Telehealth: Payer: Self-pay

## 2022-03-20 LAB — URINE CULTURE

## 2022-03-20 NOTE — Telephone Encounter (Signed)
Patient called to confirm her the prescriptions that wre sent to her pharmcy.  Made patient aware that she is to take Macrobid '100mg'$  BID x7 days, then she will start the daily Macrodantin '50mg'$  at night.  Patient voiced understanding and will confirm pharmacy has Macrodantin rx.  She did receive Macrobid '100mg'$  and states she will start that today.

## 2022-03-23 ENCOUNTER — Telehealth: Payer: Self-pay

## 2022-03-23 DIAGNOSIS — K7469 Other cirrhosis of liver: Secondary | ICD-10-CM | POA: Diagnosis not present

## 2022-03-23 DIAGNOSIS — R1032 Left lower quadrant pain: Secondary | ICD-10-CM | POA: Diagnosis not present

## 2022-03-23 DIAGNOSIS — G251 Drug-induced tremor: Secondary | ICD-10-CM | POA: Diagnosis not present

## 2022-03-23 NOTE — Telephone Encounter (Signed)
Roshad from [employer] was given instruction  how the patient is to take on both antix. Roshad voiced understanding.

## 2022-03-23 NOTE — Telephone Encounter (Signed)
Patient is aware [facility] Patient voiced that she receive her Macrobid and her Macrodantin is scheduled for delivery.  Patient is aware that she is to take the Macrobid and completed then start the Macrodantin every night before bedtime after the completion of Macrobid. Patient voiced understanding

## 2022-03-27 ENCOUNTER — Telehealth (HOSPITAL_COMMUNITY): Payer: 59 | Admitting: Psychiatry

## 2022-03-27 ENCOUNTER — Encounter (HOSPITAL_COMMUNITY): Payer: Self-pay

## 2022-03-30 DIAGNOSIS — D731 Hypersplenism: Secondary | ICD-10-CM | POA: Diagnosis not present

## 2022-03-30 DIAGNOSIS — D696 Thrombocytopenia, unspecified: Secondary | ICD-10-CM | POA: Diagnosis not present

## 2022-03-31 ENCOUNTER — Telehealth (INDEPENDENT_AMBULATORY_CARE_PROVIDER_SITE_OTHER): Payer: 59 | Admitting: Psychiatry

## 2022-03-31 ENCOUNTER — Encounter (HOSPITAL_COMMUNITY): Payer: Self-pay | Admitting: Psychiatry

## 2022-03-31 DIAGNOSIS — F3162 Bipolar disorder, current episode mixed, moderate: Secondary | ICD-10-CM | POA: Diagnosis not present

## 2022-03-31 MED ORDER — PROPRANOLOL HCL ER 80 MG PO CP24: 80.0000 mg | ORAL_CAPSULE | Freq: Every day | ORAL | 2 refills | Status: DC

## 2022-03-31 MED ORDER — DIAZEPAM 2 MG PO TABS: 2.0000 mg | ORAL_TABLET | Freq: Every day | ORAL | 2 refills | Status: DC | PRN

## 2022-03-31 MED ORDER — DIAZEPAM 10 MG PO TABS: ORAL_TABLET | ORAL | 2 refills | Status: DC

## 2022-03-31 MED ORDER — BUPROPION HCL ER (XL) 150 MG PO TB24: 150.0000 mg | ORAL_TABLET | Freq: Every morning | ORAL | 2 refills | Status: DC

## 2022-03-31 NOTE — Progress Notes (Signed)
Virtual Visit via Telephone Note  I connected with Andrea Dickson on 03/31/22 at  9:20 AM EDT by telephone and verified that I am speaking with the correct person using two identifiers.  Location: Patient: home Provider: office   I discussed the limitations, risks, security and privacy concerns of performing an evaluation and management service by telephone and the availability of in person appointments. I also discussed with the patient that there may be a patient responsible charge related to this service. The patient expressed understanding and agreed to proceed.      I discussed the assessment and treatment plan with the patient. The patient was provided an opportunity to ask questions and all were answered. The patient agreed with the plan and demonstrated an understanding of the instructions.   The patient was advised to call back or seek an in-person evaluation if the symptoms worsen or if the condition fails to improve as anticipated.  I provided 15 minutes of non-face-to-face time during this encounter.   Levonne Spiller, MD  [facility] MD/PA/NP OP Progress Note  03/31/2022 9:32 AM Andrea Dickson  MRN:  [id number]  Chief Complaint:  Chief Complaint  Patient presents with   Anxiety   Manic Behavior   Follow-up   HPI: This patient is a 72 year old separated white female who lives alone in [place].  She has 1 son and 3 grandchildren.  She used to work as a Engineer, technical sales for [employer].  She has a history of depression anxiety and possible bipolar disorder.  The patient returns for follow-up after 3 months.  She states that she finally saw oncology hematology regarding her low platelets and they thought it was due to splenomegaly.  She is not thought to have a blood disorder.  She has been having recurrent UTIs and recently started Macrodantin.  Other than that her health has been stable.  She states her mood has been good unless her sister gets her aggravated.  She states  that her sister can be very provocative and want to start arguments.  The patient visits her stepmother every day and his sister lives with his stepmother.  She seems to be handling it so far without getting too angry.  The Valium during the day helps but is making her drowsy so she has to drop it down to 2 mg and I think this is reasonable.  She is sleeping well with the Valium 10 mg at bedtime.  She denies racing thoughts or other manic symptoms or significant depression or suicidal ideation Visit Diagnosis:    ICD-10-CM   1. Bipolar 1 disorder, mixed, moderate (HCC)  F31.62       Past Psychiatric History: Past outpatient treatment  Past Medical History:  Past Medical History:  Diagnosis Date   Anxiety    Asthma    Bipolar 1 disorder ([place])    Chronic diarrhea    Cirrhosis ([place])    Diagnosed September 2020, likely secondary to [facility], s/p Hep A/B vaccination in 2021.    Depression    Diabetes ([place])    Essential tremor    GERD (gastroesophageal reflux disease)    Headache    High cholesterol     Past Surgical History:  Procedure Laterality Date   ABDOMINAL HYSTERECTOMY  1997   BIOPSY  01/17/2018   Procedure: BIOPSY;  Surgeon: Daneil Dolin, MD;  [facility];  Service: Endoscopy;;  colon   carpal tunnel Bilateral 1999, 06/2009   COLONOSCOPY  2013  Dr. Britta Mccreedy: no colon polyps. quality of prep was fair. repeat colonoscopy in five years.    COLONOSCOPY WITH PROPOFOL N/A 01/17/2018   Dr. Gala Romney: Colonic lipoma, 2 tubular adenomas removed.  Random colon biopsies negative.  Next colonoscopy 5 years.   ESOPHAGOGASTRODUODENOSCOPY (EGD) WITH PROPOFOL N/A 04/06/2019   Procedure: ESOPHAGOGASTRODUODENOSCOPY (EGD) WITH PROPOFOL;  Surgeon: Daneil Dolin, MD;   Normal esophagus, mild portal hypertensive gastropathy, otherwise normal exam.  Recommend repeat EGD in 2 years for variceal screening.    ESOPHAGOGASTRODUODENOSCOPY (EGD) WITH PROPOFOL N/A 01/19/2022   Procedure:  ESOPHAGOGASTRODUODENOSCOPY (EGD) WITH PROPOFOL;  Surgeon: Daneil Dolin, MD;  [facility];  Service: Endoscopy;  Laterality: N/A;  12:45 pm, pt knows to arrive at 9:15   POLYPECTOMY  01/17/2018   Procedure: POLYPECTOMY;  Surgeon: Daneil Dolin, MD;  [facility];  Service: Endoscopy;;  colon   SKIN CANCER EXCISION  2007   [place]   ulner nerve  Left 06/2009   decompression    Family Psychiatric History: See below  Family History:  Family History  Problem Relation Age of Onset   Diabetes Mother    Brain cancer Mother    Diabetes Father    Heart attack Father    Heart disease Father    Diabetes Sister    Bipolar disorder Brother    Diabetes Brother    Heart disease Brother    Colon cancer Neg Hx    Breast cancer Neg Hx     Social History:  Social History   Socioeconomic History   Marital status: Divorced    Spouse name: Not on file   Number of children: 1   Years of education: 12   Highest education level: Not on file  Occupational History   Occupation: retired    Comment: employed at [employer] Use   Smoking status: Every Day    Packs/day: 0.50    Years: 43.00    Additional pack years: 0.00    Total pack years: 21.50    Types: Cigarettes   Smokeless tobacco: Never   Tobacco comments:    smoking since 72yrs old.    Vaping Use   Vaping Use: Never used  Substance and Sexual Activity   Alcohol use: No    Alcohol/week: 0.0 standard drinks of alcohol   Drug use: No   Sexual activity: Not Currently  Other Topics Concern   Not on file  Social History Narrative   Lives alone.  Retired.  Education 12th grade.  One child.     Caffeine use- sodas, 2 daily   Social Determinants of Health   Financial Resource Strain: Not on file  Food Insecurity: Not on file  Transportation Needs: Not on file  Physical Activity: Not on file  Stress: Not on file  Social Connections: Not on file    Allergies:  Allergies   Allergen Reactions   Pramipexole Other (See Comments)    Shaking, palpitations, headache, faint feeling   Crestor [Rosuvastatin Calcium]     Muscle pain   Bee Pollen Other (See Comments)    Eyes and nose run   Pollen Extract Other (See Comments)    Eyes and nose run    Metabolic Disorder Labs: Lab Results  Component Value Date   HGBA1C 5.4 04/24/2015   MPG 108 04/24/2015   No results found for: "PROLACTIN" Lab Results  Component Value Date   CHOL 178 02/17/2016   TRIG 229 (H)  02/17/2016   HDL 30 (L) 02/17/2016   CHOLHDL 5.9 (H) 02/17/2016   VLDL 46 (H) 02/17/2016   LDLCALC 102 (H) 02/17/2016   LDLCALC 126 (H) 11/18/2015   Lab Results  Component Value Date   TSH 1.00 12/24/2021   TSH 2.96 08/29/2019    Therapeutic Level Labs: Lab Results  Component Value Date   LITHIUM 0.8 08/29/2019   LITHIUM 0.4 (L) 10/13/2018   Lab Results  Component Value Date   VALPROATE 62.2 08/20/2016   VALPROATE 83.0 10/14/2015   No results found for: "CBMZ"  Current Medications: Current Outpatient Medications  Medication Sig Dispense Refill   diazepam (VALIUM) 2 MG tablet Take 1 tablet (2 mg total) by mouth daily as needed for anxiety. 30 tablet 2   acetaminophen (TYLENOL) 500 MG tablet Take 1,000 mg by mouth 2 (two) times daily as needed for moderate pain or headache.     buPROPion (WELLBUTRIN XL) 150 MG 24 hr tablet Take 1 tablet (150 mg total) by mouth every morning. 30 tablet 2   cetirizine (ZYRTEC) 10 MG tablet Take 10 mg by mouth daily. Aller-Tec     Cholecalciferol (VITAMIN D3) 50 MCG (2000 UT) TABS Take 2,000 Units by mouth daily.      diazepam (VALIUM) 10 MG tablet TAKE ONE TABLET BY MOUTH EVERYDAY AT BEDTIME 60 tablet 2   estrogens, conjugated, (PREMARIN) 1.25 MG tablet Take 1.25 mg by mouth daily.      ezetimibe (ZETIA) 10 MG tablet Take 10 mg by mouth daily.     fluticasone (FLONASE) 50 MCG/ACT nasal spray Place 2 sprays into both nostrils daily.      Lactobacillus-Inulin ([place]) CAPS Take 1 capsule by mouth daily.     loperamide (IMODIUM) 2 MG capsule Take 2 mg by mouth daily as needed for diarrhea or loose stools. (Patient not taking: Reported on 02/04/2022)     nitrofurantoin (MACRODANTIN) 50 MG capsule Take 1 capsule (50 mg total) by mouth at bedtime. 30 capsule 11   nitrofurantoin, macrocrystal-monohydrate, (MACROBID) 100 MG capsule Take 1 capsule (100 mg total) by mouth every 12 (twelve) hours. 14 capsule 0   omeprazole (PRILOSEC) 40 MG capsule TAKE ONE CAPSULE BY MOUTH AT BREAKFAST AND AT BEDTIME 60 capsule 5   ondansetron (ZOFRAN) 4 MG tablet TAKE ONE TABLET BY MOUTH EVERY 8 HOURS AS NEEDED FOR NAUSEA AND VOMITING 20 tablet 0   propranolol ER (INDERAL LA) 80 MG 24 hr capsule Take 1 capsule (80 mg total) by mouth daily. 30 capsule 2   No current facility-administered medications for this visit.     Musculoskeletal: Strength & Muscle Tone: na Gait & Station: na Patient leans: N/A  Psychiatric Specialty Exam: Review of Systems  All other systems reviewed and are negative.   There were no vitals taken for this visit.There is no height or weight on file to calculate BMI.  General Appearance: NA  Eye Contact:  NA  Speech:  Clear and Coherent  Volume:  Normal  Mood:  Euthymic  Affect:  Congruent  Thought Process:  Goal Directed  Orientation:  Full (Time, Place, and Person)  Thought Content: WDL   Suicidal Thoughts:  No  Homicidal Thoughts:  No  Memory:  Immediate;   Good Recent;   Good Remote;   Good  Judgement:  Good  Insight:  Good  Psychomotor Activity:  Normal  Concentration:  Concentration: Good and Attention Span: Good  Recall:  Good  Fund of Knowledge: Good  Language:  Good  Akathisia:  No  Handed:  Right  AIMS (if indicated): not done  Assets:  Communication Skills Desire for Improvement Resilience Social Support Talents/Skills  ADL's:  Intact  Cognition: WNL  Sleep:  Good    Screenings: PHQ2-9    Flowsheet Row Video Visit from 09/30/2021 in [place] at [place] Video Visit from 06/26/2021 in [place] at [place] Video Visit from 03/26/2021 in [place] at [place] Video Visit from 12/18/2020 in [place] at [place] Video Visit from 09/24/2020 in [place] at [facility] Total Score 0 0 0 0 0      Flowsheet Row Admission (Discharged) from 01/19/2022 in [place] Video Visit from 09/30/2021 in [place] at [place] Video Visit from 06/26/2021 in [place] at [place] No Risk No Risk No Risk        Assessment and Plan: This patient is a 72 year old female with a history of bipolar disorder anxiety and tremor.  She will continue Wellbutrin XL 150 mg every morning for depression which has worked well.  She will continue Valium 10 mg at bedtime for sleep.  She will decrease Valium during the day to just 2 mg as needed since she states that 5 mg has made her too drowsy.  She will continue Inderal LA 80 mg daily for tremor.  She will return to see me in 3 months  Collaboration of Care: Collaboration of Care: Primary Care Provider AEB notes will be shared with PCP at patient's request  Patient/Guardian was advised Release of Information must be obtained prior to any record release in order to collaborate their care with an outside provider. Patient/Guardian was advised if they have not already done so to contact the registration department to sign all necessary forms in order for [place] to release information regarding their care.   Consent: Patient/Guardian gives verbal consent for treatment and assignment of benefits for services provided during this visit. Patient/Guardian expressed understanding and agreed to  proceed.    Levonne Spiller, MD 03/31/2022, 9:32 AM

## 2022-04-01 ENCOUNTER — Telehealth: Payer: Self-pay

## 2022-04-01 NOTE — Telephone Encounter (Signed)
Return call to patient. Patient voiced that she is having a recurring UTI. Patient states that she had an UTI about two weeks ago and now she is experiencing sxs of huring when voiding and her urine has an odor. Patient is aware to do a urine drop off and patient voiced understanding

## 2022-04-02 ENCOUNTER — Ambulatory Visit (INDEPENDENT_AMBULATORY_CARE_PROVIDER_SITE_OTHER): Payer: 59 | Admitting: Urology

## 2022-04-02 DIAGNOSIS — N390 Urinary tract infection, site not specified: Secondary | ICD-10-CM

## 2022-04-02 LAB — URINALYSIS, ROUTINE W REFLEX MICROSCOPIC
Bilirubin, UA: NEGATIVE
Glucose, UA: NEGATIVE
Ketones, UA: NEGATIVE
Nitrite, UA: NEGATIVE
Protein,UA: NEGATIVE
Specific Gravity, UA: 1.015 (ref 1.005–1.030)
Urobilinogen, Ur: 1 mg/dL (ref 0.2–1.0)
pH, UA: 6.5 (ref 5.0–7.5)

## 2022-04-02 LAB — MICROSCOPIC EXAMINATION: WBC, UA: >30 /hpf — AB (ref 0–5)

## 2022-04-02 MED ORDER — SULFAMETHOXAZOLE-TRIMETHOPRIM 800-160 MG PO TABS: 1.0000 | ORAL_TABLET | Freq: Two times a day (BID) | ORAL | 0 refills | Status: DC

## 2022-04-02 NOTE — Progress Notes (Signed)
Patient presents today with complaints of same UTI sxs after treatment on Macrobid 100 mg BID X 7 days.  UA and Culture done today.  Dr. Alyson Ingles reviewed results and treatment of Bactrim po bid x 7 days .  Patient aware of MD recommendations and that we will reach out with culture results.      [id number], [place]

## 2022-04-02 NOTE — Addendum Note (Signed)
Addended by: Darcella Gasman R on: 04/02/2022 01:20 PM   Modules accepted: Level of Service

## 2022-04-04 LAB — URINE CULTURE: Organism ID, Bacteria: NO GROWTH

## 2022-04-16 DIAGNOSIS — I10 Essential (primary) hypertension: Secondary | ICD-10-CM | POA: Diagnosis not present

## 2022-04-16 DIAGNOSIS — K7460 Unspecified cirrhosis of liver: Secondary | ICD-10-CM | POA: Diagnosis not present

## 2022-04-16 DIAGNOSIS — R161 Splenomegaly, not elsewhere classified: Secondary | ICD-10-CM | POA: Diagnosis not present

## 2022-04-16 DIAGNOSIS — K766 Portal hypertension: Secondary | ICD-10-CM | POA: Diagnosis not present

## 2022-04-16 DIAGNOSIS — E1169 Type 2 diabetes mellitus with other specified complication: Secondary | ICD-10-CM | POA: Diagnosis not present

## 2022-04-16 DIAGNOSIS — F17210 Nicotine dependence, cigarettes, uncomplicated: Secondary | ICD-10-CM | POA: Diagnosis not present

## 2022-04-16 DIAGNOSIS — D696 Thrombocytopenia, unspecified: Secondary | ICD-10-CM | POA: Diagnosis not present

## 2022-04-16 DIAGNOSIS — N390 Urinary tract infection, site not specified: Secondary | ICD-10-CM | POA: Diagnosis not present

## 2022-04-16 DIAGNOSIS — R109 Unspecified abdominal pain: Secondary | ICD-10-CM | POA: Diagnosis not present

## 2022-04-16 DIAGNOSIS — K589 Irritable bowel syndrome without diarrhea: Secondary | ICD-10-CM | POA: Diagnosis not present

## 2022-04-16 DIAGNOSIS — K760 Fatty (change of) liver, not elsewhere classified: Secondary | ICD-10-CM | POA: Diagnosis not present

## 2022-04-22 DIAGNOSIS — J449 Chronic obstructive pulmonary disease, unspecified: Secondary | ICD-10-CM | POA: Diagnosis not present

## 2022-04-22 DIAGNOSIS — Z122 Encounter for screening for malignant neoplasm of respiratory organs: Secondary | ICD-10-CM | POA: Diagnosis not present

## 2022-04-22 DIAGNOSIS — Z87891 Personal history of nicotine dependence: Secondary | ICD-10-CM | POA: Diagnosis not present

## 2022-04-22 DIAGNOSIS — F17210 Nicotine dependence, cigarettes, uncomplicated: Secondary | ICD-10-CM | POA: Diagnosis not present

## 2022-04-24 DIAGNOSIS — K7460 Unspecified cirrhosis of liver: Secondary | ICD-10-CM | POA: Diagnosis not present

## 2022-04-24 DIAGNOSIS — R161 Splenomegaly, not elsewhere classified: Secondary | ICD-10-CM | POA: Diagnosis not present

## 2022-04-29 DIAGNOSIS — E1169 Type 2 diabetes mellitus with other specified complication: Secondary | ICD-10-CM | POA: Diagnosis not present

## 2022-04-29 DIAGNOSIS — N1831 Chronic kidney disease, stage 3a: Secondary | ICD-10-CM | POA: Diagnosis not present

## 2022-04-29 DIAGNOSIS — E1165 Type 2 diabetes mellitus with hyperglycemia: Secondary | ICD-10-CM | POA: Diagnosis not present

## 2022-04-29 DIAGNOSIS — I10 Essential (primary) hypertension: Secondary | ICD-10-CM | POA: Diagnosis not present

## 2022-04-30 ENCOUNTER — Telehealth: Payer: Self-pay | Admitting: Gastroenterology

## 2022-04-30 NOTE — Telephone Encounter (Signed)
Reviewed, abdominal ultrasound performed 04/13/2022.  Evidence of known cirrhosis without focal liver lesion or biliary ductal dilation.  Normal gallbladder.  Mild splenomegaly consistent with portal hypertension.  CT chest without contrast for lung cancer screening: -No evidence of cardiomegaly -Mild atherosclerotic calcification in the wall of the thoracic aorta -Continue annual screening low-dose chest CT without contrast in 12 months -Cirrhosis with splenomegaly and small to moderate volume ascites in the upper abdomen  Continue with current plan outlined at last office visit.  There is nothing to be done about her splenomegaly as this is a result of portal hypertension secondary to liver disease.  She should continue to follow a cirrhosis diet . Cirrhosis Lifestyle Recommendations:  High-protein diet from a primarily plant-based diet. Avoid red meat.  No raw or undercooked meat, seafood, or shellfish. Low-fat/cholesterol/carbohydrate diet. Limit sodium to no more than 2000 mg/day including everything that you eat and drink. Recommend at least 30 minutes of aerobic and resistance exercise 3 days/week. Limit Tylenol to 2000 mg daily.   Plan is for follow-up in July.  Given that she recently had abdominal ultrasound performed last week she will not be due for an ultrasound again until October.  We will need to repeat her MELD labs in July.  Brooke Bonito, MSN, APRN, FNP-BC, AGACNP-BC [facility] at [facility]

## 2022-05-04 DIAGNOSIS — K589 Irritable bowel syndrome without diarrhea: Secondary | ICD-10-CM | POA: Diagnosis not present

## 2022-05-04 DIAGNOSIS — K7460 Unspecified cirrhosis of liver: Secondary | ICD-10-CM | POA: Diagnosis not present

## 2022-05-04 DIAGNOSIS — R030 Elevated blood-pressure reading, without diagnosis of hypertension: Secondary | ICD-10-CM | POA: Diagnosis not present

## 2022-05-04 DIAGNOSIS — E1169 Type 2 diabetes mellitus with other specified complication: Secondary | ICD-10-CM | POA: Diagnosis not present

## 2022-05-04 DIAGNOSIS — K766 Portal hypertension: Secondary | ICD-10-CM | POA: Diagnosis not present

## 2022-05-04 DIAGNOSIS — D696 Thrombocytopenia, unspecified: Secondary | ICD-10-CM | POA: Diagnosis not present

## 2022-05-04 DIAGNOSIS — R109 Unspecified abdominal pain: Secondary | ICD-10-CM | POA: Diagnosis not present

## 2022-05-04 DIAGNOSIS — K760 Fatty (change of) liver, not elsewhere classified: Secondary | ICD-10-CM | POA: Diagnosis not present

## 2022-05-04 DIAGNOSIS — R161 Splenomegaly, not elsewhere classified: Secondary | ICD-10-CM | POA: Diagnosis not present

## 2022-05-04 DIAGNOSIS — Z0001 Encounter for general adult medical examination with abnormal findings: Secondary | ICD-10-CM | POA: Diagnosis not present

## 2022-05-04 DIAGNOSIS — F17210 Nicotine dependence, cigarettes, uncomplicated: Secondary | ICD-10-CM | POA: Diagnosis not present

## 2022-05-07 ENCOUNTER — Telehealth: Payer: Self-pay | Admitting: *Deleted

## 2022-05-07 NOTE — Telephone Encounter (Signed)
Pt called and states she is in a lot of pain. She states she stays bloated all the time and nothing is helping. She has gone to her PCP and he won't do anything for her. She states she needs something for pain. Please advise.

## 2022-05-08 ENCOUNTER — Encounter: Payer: Self-pay | Admitting: *Deleted

## 2022-05-11 ENCOUNTER — Other Ambulatory Visit: Payer: Self-pay | Admitting: Gastroenterology

## 2022-05-11 MED ORDER — HYOSCYAMINE SULFATE 0.125 MG SL SUBL: 0.1250 mg | SUBLINGUAL_TABLET | Freq: Four times a day (QID) | SUBLINGUAL | 1 refills | Status: DC | PRN

## 2022-05-11 NOTE — Telephone Encounter (Signed)
Spoke to pt, informed her of recommendations. Pt voiced understanding. Pt states she is taking a probiotic and Bean-O. She would like you send prescription sent to [facility] in [place]. Also wants to know if she is to stop taking dicyclomine when she starts hyoscyamine.

## 2022-05-12 DIAGNOSIS — Z1231 Encounter for screening mammogram for malignant neoplasm of breast: Secondary | ICD-10-CM | POA: Diagnosis not present

## 2022-05-18 DIAGNOSIS — K589 Irritable bowel syndrome without diarrhea: Secondary | ICD-10-CM | POA: Diagnosis not present

## 2022-05-18 DIAGNOSIS — Z79899 Other long term (current) drug therapy: Secondary | ICD-10-CM | POA: Diagnosis not present

## 2022-05-18 DIAGNOSIS — R109 Unspecified abdominal pain: Secondary | ICD-10-CM | POA: Diagnosis not present

## 2022-05-18 DIAGNOSIS — K760 Fatty (change of) liver, not elsewhere classified: Secondary | ICD-10-CM | POA: Diagnosis not present

## 2022-05-18 DIAGNOSIS — R161 Splenomegaly, not elsewhere classified: Secondary | ICD-10-CM | POA: Diagnosis not present

## 2022-05-18 DIAGNOSIS — K7460 Unspecified cirrhosis of liver: Secondary | ICD-10-CM | POA: Diagnosis not present

## 2022-05-18 DIAGNOSIS — G894 Chronic pain syndrome: Secondary | ICD-10-CM | POA: Diagnosis not present

## 2022-05-18 DIAGNOSIS — Z79891 Long term (current) use of opiate analgesic: Secondary | ICD-10-CM | POA: Diagnosis not present

## 2022-05-20 ENCOUNTER — Encounter: Payer: Self-pay | Admitting: *Deleted

## 2022-05-21 NOTE — Telephone Encounter (Signed)
Tried several times to reach pt. Sent a MyChart message.  

## 2022-05-26 DIAGNOSIS — R161 Splenomegaly, not elsewhere classified: Secondary | ICD-10-CM | POA: Diagnosis not present

## 2022-05-26 DIAGNOSIS — R109 Unspecified abdominal pain: Secondary | ICD-10-CM | POA: Diagnosis not present

## 2022-05-26 DIAGNOSIS — E1169 Type 2 diabetes mellitus with other specified complication: Secondary | ICD-10-CM | POA: Diagnosis not present

## 2022-05-26 DIAGNOSIS — F17210 Nicotine dependence, cigarettes, uncomplicated: Secondary | ICD-10-CM | POA: Diagnosis not present

## 2022-05-26 DIAGNOSIS — R030 Elevated blood-pressure reading, without diagnosis of hypertension: Secondary | ICD-10-CM | POA: Diagnosis not present

## 2022-05-26 DIAGNOSIS — K760 Fatty (change of) liver, not elsewhere classified: Secondary | ICD-10-CM | POA: Diagnosis not present

## 2022-05-26 DIAGNOSIS — D696 Thrombocytopenia, unspecified: Secondary | ICD-10-CM | POA: Diagnosis not present

## 2022-05-26 DIAGNOSIS — K589 Irritable bowel syndrome without diarrhea: Secondary | ICD-10-CM | POA: Diagnosis not present

## 2022-05-26 DIAGNOSIS — K766 Portal hypertension: Secondary | ICD-10-CM | POA: Diagnosis not present

## 2022-05-26 DIAGNOSIS — K7460 Unspecified cirrhosis of liver: Secondary | ICD-10-CM | POA: Diagnosis not present

## 2022-05-27 ENCOUNTER — Ambulatory Visit (INDEPENDENT_AMBULATORY_CARE_PROVIDER_SITE_OTHER): Payer: 59 | Admitting: Gastroenterology

## 2022-05-27 ENCOUNTER — Telehealth (INDEPENDENT_AMBULATORY_CARE_PROVIDER_SITE_OTHER): Payer: Self-pay | Admitting: Gastroenterology

## 2022-05-27 ENCOUNTER — Encounter: Payer: Self-pay | Admitting: *Deleted

## 2022-05-27 ENCOUNTER — Ambulatory Visit (HOSPITAL_COMMUNITY)
Admission: RE | Admit: 2022-05-27 | Discharge: 2022-05-27 | Disposition: A | Discharge status: Home / Self Care | Payer: 59 | Source: Ambulatory Visit | Attending: Gastroenterology | Admitting: Gastroenterology

## 2022-05-27 ENCOUNTER — Encounter (HOSPITAL_COMMUNITY): Payer: Self-pay

## 2022-05-27 ENCOUNTER — Encounter: Payer: Self-pay | Admitting: Gastroenterology

## 2022-05-27 VITALS — BP 128/72 | HR 70 | Temp 97.8°F | Ht 66.0 in | Wt 160.6 lb

## 2022-05-27 DIAGNOSIS — K7460 Unspecified cirrhosis of liver: Secondary | ICD-10-CM

## 2022-05-27 DIAGNOSIS — R188 Other ascites: Secondary | ICD-10-CM | POA: Diagnosis not present

## 2022-05-27 DIAGNOSIS — K529 Noninfective gastroenteritis and colitis, unspecified: Secondary | ICD-10-CM

## 2022-05-27 LAB — BODY FLUID CELL COUNT WITH DIFFERENTIAL
Eos, Fluid: 0 %
Lymphs, Fluid: 92 %
Monocyte-Macrophage-Serous Fluid: 6 % — ABNORMAL LOW (ref 50–90)
Neutrophil Count, Fluid: 2 % (ref 0–25)
Total Nucleated Cell Count, Fluid: 487 cu mm (ref 0–1000)

## 2022-05-27 LAB — GRAM STAIN

## 2022-05-27 MED ORDER — LIDOCAINE HCL (PF) 2 % IJ SOLN
10.0000 mL | Freq: Once | INTRAMUSCULAR | Status: AC
  Administered 2022-05-27: 10 mL

## 2022-05-27 NOTE — Procedures (Signed)
PROCEDURE SUMMARY:  Successful ultrasound guided paracentesis from the right lower quadrant.  Yielded 4.8 L of hazy yellow fluid.  No immediate complications.  The patient tolerated the procedure well.   Specimen was sent for labs.  EBL < 5mL  If the patient eventually requires >/=2 paracenteses in a 30 day period, screening evaluation by the [facility] will be assessed.  Lynnette Caffey, PA-C

## 2022-05-27 NOTE — Patient Instructions (Signed)
For new onset abdominal swelling: I have ordered a paracentesis to remove fluid. I have also ordered an ultrasound of your liver and specialized ultrasound that looks at the veins in liver to make sure no clot that could be causing this new onset swelling.  Please follow a low sodium diet attached (no more than 2 grams a day). There is NO fluid restriction.  Please have blood work done when you are able!  For diarrhea: take 1 capsule of Zenpep while eating your meals and 1 with snacks. We can increase this if needed as it is weight-based, and we have room to go up!  Further recommendations to follow!  6 week follow-up!  It was a pleasure to see you today. I want to create trusting relationships with patients and provide genuine, compassionate, and quality care. I truly value your feedback, so please be on the lookout for a survey regarding your visit with me today. I appreciate your time in completing this!         Gelene Mink, PhD, ANP-BC [facility]

## 2022-05-27 NOTE — Progress Notes (Signed)
Patient tolerated right sided paracentesis procedure well today and 4.8 Liters of cloudy yellow ascites removed with labs collected and sent for processing. Patient verbalized understanding of discharge instructions and ambulatory at departure with no acute distress noted.

## 2022-05-27 NOTE — Telephone Encounter (Signed)
Abd complete with doppler scheduled for 06/11/22 at 9:30 am. Arrive at 9:15am.  NPO after midnight. Pt contacted and made aware

## 2022-05-27 NOTE — Progress Notes (Signed)
Gastroenterology Office Note     Primary Care Physician:  Juliette Alcide, MD  Primary Gastroenterologist: Dr. Jena Gauss   Chief Complaint   Chief Complaint  Patient presents with   Cirrhosis    Patient here today for a follow up on cirrhosis. Patient says she has a lot of fluid on her abdomen. Patient says she has nausea a lot and she has diarrhea several times per day. She takes imodium once per day.     History of Present Illness   Andrea Dickson is a 72 y.o. female presenting today in follow-up with a history of MASH cirrhosis, chronic diarrhea, chronic nausea. She is here today due to new onset abdominal distension and concern for ascites. Saw PCP yesterday.   Cirrhosis: Saw Dr. Leandrew Koyanagi yesterday. About a month ago started slowly accumulating fluid. Feels miserable. Feels distended. Pain in stomach, bilateral flanks, chronic left-sided pain. No fever or chills. No rectal bleeding. Prescribed sprionolactone 25 mg to start by PCP. Hasn't picked up yet.   Diarrhea: trial of Xifaxan in Jan 2024 for IBS-D. Negative colonic biopsies in past, negative celiac screen, trial off metformin without improvement, fecal elastase normal.  Will sometimes have soft bowel movement, then start having loose stools. Can be posptrandial or not. Nothing to eat yesterday but had persistent diarrhea. Avoiding dairy, red meat. Dicyclomine without improvement. Takes one to 2 imodium each morning. Wears depends daily as doesn't know when urgency will hit. Levsin not helpful with cramping. Zofran although for nausea helped with cramping and ease off the cramping. Doesn't remember taking pancreatic enzymes. Stool will be greasy at times.     EGD 01/19/2022: -3 columns of grade 2/limited grade 3 varices -Portal hypertensive gastropathy -Friable gastric mucosa -Continue Inderal daily -No repeat EGD needed as long as no evidence of upper GI bleed   Colonoscopy Jan 2020: colonic lipoma, tubular adenomas,  random colonic biopsies negative.    Last [place] Jan 2024: cirrhosis with moderate splenomegaly.    Past Medical History:  Diagnosis Date   Anxiety    Asthma    Bipolar 1 disorder (HCC)    Chronic diarrhea    Cirrhosis (HCC)    Diagnosed September 2020, likely secondary to [facility], s/p Hep A/B vaccination in 2021.    Depression    Diabetes (HCC)    Essential tremor    GERD (gastroesophageal reflux disease)    Headache    High cholesterol     Past Surgical History:  Procedure Laterality Date   ABDOMINAL HYSTERECTOMY  1997   BIOPSY  01/17/2018   Procedure: BIOPSY;  Surgeon: Corbin Ade, MD;  Location: [facility];  Service: Endoscopy;;  colon   carpal tunnel Bilateral 1999, 06/2009   COLONOSCOPY  2013   Dr. Teena Dunk: no colon polyps. quality of prep was fair. repeat colonoscopy in five years.    COLONOSCOPY WITH PROPOFOL N/A 01/17/2018   Dr. Jena Gauss: Colonic lipoma, 2 tubular adenomas removed.  Random colon biopsies negative.  Next colonoscopy 5 years.   ESOPHAGOGASTRODUODENOSCOPY (EGD) WITH PROPOFOL N/A 04/06/2019   Procedure: ESOPHAGOGASTRODUODENOSCOPY (EGD) WITH PROPOFOL;  Surgeon: Corbin Ade, MD;   Normal esophagus, mild portal hypertensive gastropathy, otherwise normal exam.  Recommend repeat EGD in 2 years for variceal screening.    ESOPHAGOGASTRODUODENOSCOPY (EGD) WITH PROPOFOL N/A 01/19/2022   Procedure: ESOPHAGOGASTRODUODENOSCOPY (EGD) WITH PROPOFOL;  Surgeon: Corbin Ade, MD;  Location: [facility];  Service: Endoscopy;  Laterality: N/A;  12:45 pm, pt knows to  arrive at 9:15   POLYPECTOMY  01/17/2018   Procedure: POLYPECTOMY;  Surgeon: Corbin Ade, MD;  Location: [facility];  Service: Endoscopy;;  colon   SKIN CANCER EXCISION  2007   TONSILLECTOMY  1965   ulner nerve  Left 06/2009   decompression    Current Outpatient Medications  Medication Sig Dispense Refill   acetaminophen (TYLENOL) 500 MG tablet Take 1,000 mg by mouth 2 (two) times daily as needed for  moderate pain or headache.     buPROPion (WELLBUTRIN XL) 150 MG 24 hr tablet Take 1 tablet (150 mg total) by mouth every morning. 30 tablet 2   cetirizine (ZYRTEC) 10 MG tablet Take 10 mg by mouth daily. Aller-Tec     Cholecalciferol (VITAMIN D3) 50 MCG (2000 UT) TABS Take 2,000 Units by mouth daily.      diazepam (VALIUM) 10 MG tablet TAKE ONE TABLET BY MOUTH EVERYDAY AT BEDTIME 60 tablet 2   diazepam (VALIUM) 2 MG tablet Take 1 tablet (2 mg total) by mouth daily as needed for anxiety. 30 tablet 2   estrogens, conjugated, (PREMARIN) 1.25 MG tablet Take 1.25 mg by mouth daily.      ezetimibe (ZETIA) 10 MG tablet Take 10 mg by mouth daily.     HYDROcodone-acetaminophen (NORCO/VICODIN) 5-325 MG tablet Take 1 tablet by mouth every 8 (eight) hours as needed.     hyoscyamine (LEVSIN SL) 0.125 MG SL tablet Place 1 tablet (0.125 mg total) under the tongue every 6 (six) hours as needed for cramping. 30 tablet 1   Lactobacillus-Inulin (CULTURELLE DIGESTIVE HEALTH) CAPS Take 1 capsule by mouth daily.     loperamide (IMODIUM) 2 MG capsule Take 2 mg by mouth daily as needed for diarrhea or loose stools.     nitrofurantoin (MACRODANTIN) 50 MG capsule Take 1 capsule (50 mg total) by mouth at bedtime. 30 capsule 11   omeprazole (PRILOSEC) 40 MG capsule TAKE ONE CAPSULE BY MOUTH AT BREAKFAST AND AT BEDTIME 60 capsule 5   ondansetron (ZOFRAN) 4 MG tablet TAKE ONE TABLET BY MOUTH EVERY 8 HOURS AS NEEDED FOR NAUSEA AND VOMITING 20 tablet 0   propranolol ER (INDERAL LA) 80 MG 24 hr capsule Take 1 capsule (80 mg total) by mouth daily. 30 capsule 2   No current facility-administered medications for this visit.    Allergies as of 05/27/2022 - Review Complete 05/27/2022  Allergen Reaction Noted   Pramipexole Other (See Comments) 04/24/2015   Crestor [rosuvastatin calcium]  03/28/2019   Bee pollen Other (See Comments) 04/24/2015   Pollen extract Other (See Comments) 04/24/2015    Family History  Problem Relation  Age of Onset   Diabetes Mother    Brain cancer Mother    Diabetes Father    Heart attack Father    Heart disease Father    Diabetes Sister    Bipolar disorder Brother    Diabetes Brother    Heart disease Brother    Colon cancer Neg Hx    Breast cancer Neg Hx     Social History   Socioeconomic History   Marital status: Divorced    Spouse name: Not on file   Number of children: 1   Years of education: 12   Highest education level: Not on file  Occupational History   Occupation: retired    Comment: employed at [employer]  Tobacco Use   Smoking status: Every Day    Packs/day: 0.50    Years: 43.00  Additional pack years: 0.00    Total pack years: 21.50    Types: Cigarettes   Smokeless tobacco: Never   Tobacco comments:    smoking since 72yrs old.    Vaping Use   Vaping Use: Never used  Substance and Sexual Activity   Alcohol use: No    Alcohol/week: 0.0 standard drinks of alcohol   Drug use: No   Sexual activity: Not Currently  Other Topics Concern   Not on file  Social History Narrative   Lives alone.  Retired.  Education 12th grade.  One child.     Caffeine use- sodas, 2 daily   Social Determinants of Health   Financial Resource Strain: Not on file  Food Insecurity: Not on file  Transportation Needs: Not on file  Physical Activity: Not on file  Stress: Not on file  Social Connections: Not on file  Intimate Partner Violence: Not on file     Review of Systems   Gen: Denies any fever, chills, fatigue, weight loss, lack of appetite.  CV: Denies chest pain, heart palpitations, peripheral edema, syncope.  Resp: Denies shortness of breath at rest or with exertion. Denies wheezing or cough.  GI: Denies dysphagia or odynophagia. Denies jaundice, hematemesis, fecal incontinence. GU : Denies urinary burning, urinary frequency, urinary hesitancy MS: Denies joint pain, muscle weakness, cramps, or limitation of movement.  Derm: Denies rash, itching, dry  skin Psych: Denies depression, anxiety, memory loss, and confusion Heme: Denies bruising, bleeding, and enlarged lymph nodes.   Physical Exam   BP 128/72 (BP Location: Left Arm, Patient Position: Sitting, Cuff Size: Large)   Pulse 70   Temp 97.8 F (36.6 C) (Temporal)   Ht 5\' 6"  (1.676 m)   Wt 160 lb 9.6 oz (72.8 kg)   BMI 25.92 kg/m  General:   Alert and oriented. Pleasant and cooperative. Well-nourished and well-developed.  Head:  Normocephalic and atraumatic. Eyes:  Without icterus Abdomen:  +BS, distended, tense, TTP lower abdomen, no rebound or guarding Rectal:  Deferred  Msk:  Symmetrical without gross deformities. Normal posture. Extremities:  mild ankle edema  Neurologic:  Alert and  oriented x4;  grossly normal neurologically. Skin:  Intact without significant lesions or rashes. Psych:  Alert and cooperative. Normal mood and affect.   Assessment   Andrea Dickson is a 72 y.o. female presenting today in follow-up with a history of  MASH cirrhosis, chronic diarrhea, chronic nausea. She is here today due to new onset abdominal distension and concern for ascites. Saw PCP yesterday.   Decompensated MASH cirrhosis: unclear etiology of new onset ascites. No prior need for para. Afebrile, low concern for SBP. Chronic abdominal pain at baseline. Will have [place] para with fluid analysis today. We need to add labs, AFP tumor marker, update US abdomen now, and also perform doppler study to assess for PVT. May need more extensive imaging depending on findings. Low sodium diet.   Diarrhea: chronic. Despite fecal elastase being normal, she could still have underlying EPI. Will trial Zenpep 40,000 units one with meals and one with snacks, with room to titrate up as needed. Extensive eval as noted in HPI.      Colonoscopy Jan 2020: colonic lipoma, tubular adenomas, random colonic biopsies negative.    Last [place] Jan 2024: cirrhosis with moderate splenomegaly.    PLAN    [place] para  with fluid analysis US abdomen complete with doppler to evaluate for PVT MELD labs along with AFP Trial of Zenpep 40,000 units  with meals and snacks: room to titrate if needed 2 gram sodium diet Close follow-up in 6 weeks   Gelene Mink, PhD, [facility]

## 2022-05-28 LAB — COMPREHENSIVE METABOLIC PANEL
ALT: 7 IU/L (ref 0–32)
AST: 21 IU/L (ref 0–40)
Albumin/Globulin Ratio: 1.6 (ref 1.2–2.2)
Albumin: 4 g/dL (ref 3.8–4.8)
Alkaline Phosphatase: 130 IU/L — ABNORMAL HIGH (ref 44–121)
BUN/Creatinine Ratio: 11 — ABNORMAL LOW (ref 12–28)
BUN: 10 mg/dL (ref 8–27)
Bilirubin Total: 1.4 mg/dL — ABNORMAL HIGH (ref 0.0–1.2)
CO2: 22 mmol/L (ref 20–29)
Calcium: 9.1 mg/dL (ref 8.7–10.3)
Chloride: 105 mmol/L (ref 96–106)
Creatinine, Ser: 0.89 mg/dL (ref 0.57–1.00)
Globulin, Total: 2.5 g/dL (ref 1.5–4.5)
Glucose: 98 mg/dL (ref 70–99)
Potassium: 4 mmol/L (ref 3.5–5.2)
Sodium: 140 mmol/L (ref 134–144)
Total Protein: 6.5 g/dL (ref 6.0–8.5)
eGFR: 69 mL/min/{1.73_m2} (ref >59)

## 2022-05-28 LAB — PROTIME-INR
INR: 1.1 (ref 0.9–1.2)
Prothrombin Time: 11.6 s (ref 9.1–12.0)

## 2022-05-28 LAB — AFP TUMOR MARKER: AFP, Serum, Tumor Marker: 3 ng/mL (ref 0.0–9.2)

## 2022-05-29 LAB — CYTOLOGY - NON PAP

## 2022-05-30 LAB — CULTURE, BODY FLUID W GRAM STAIN -BOTTLE: Culture: NO GROWTH

## 2022-05-30 LAB — ACID FAST SMEAR (AFB, MYCOBACTERIA): Acid Fast Smear: NEGATIVE

## 2022-05-31 LAB — CULTURE, BODY FLUID W GRAM STAIN -BOTTLE

## 2022-06-02 LAB — CULTURE, BODY FLUID W GRAM STAIN -BOTTLE

## 2022-06-11 ENCOUNTER — Ambulatory Visit (HOSPITAL_COMMUNITY)
Admission: RE | Admit: 2022-06-11 | Discharge: 2022-06-11 | Disposition: A | Discharge status: Home / Self Care | Payer: 59 | Source: Ambulatory Visit | Attending: Gastroenterology | Admitting: Gastroenterology

## 2022-06-11 ENCOUNTER — Telehealth: Payer: Self-pay

## 2022-06-11 DIAGNOSIS — K7460 Unspecified cirrhosis of liver: Secondary | ICD-10-CM | POA: Insufficient documentation

## 2022-06-11 DIAGNOSIS — R188 Other ascites: Secondary | ICD-10-CM | POA: Diagnosis not present

## 2022-06-11 DIAGNOSIS — R161 Splenomegaly, not elsewhere classified: Secondary | ICD-10-CM | POA: Diagnosis not present

## 2022-06-11 NOTE — Telephone Encounter (Signed)
Pt called stating that when she had her [place] that she needed a para done.and that she needed a standing order to have them done. Advised the pt that her report hasn't came back yet and I would put in a note to you

## 2022-06-15 DIAGNOSIS — R109 Unspecified abdominal pain: Secondary | ICD-10-CM | POA: Diagnosis not present

## 2022-06-15 DIAGNOSIS — K589 Irritable bowel syndrome without diarrhea: Secondary | ICD-10-CM | POA: Diagnosis not present

## 2022-06-15 DIAGNOSIS — G894 Chronic pain syndrome: Secondary | ICD-10-CM | POA: Diagnosis not present

## 2022-06-15 DIAGNOSIS — K7460 Unspecified cirrhosis of liver: Secondary | ICD-10-CM | POA: Diagnosis not present

## 2022-06-15 DIAGNOSIS — R161 Splenomegaly, not elsewhere classified: Secondary | ICD-10-CM | POA: Diagnosis not present

## 2022-06-15 DIAGNOSIS — K760 Fatty (change of) liver, not elsewhere classified: Secondary | ICD-10-CM | POA: Diagnosis not present

## 2022-06-15 DIAGNOSIS — Z79891 Long term (current) use of opiate analgesic: Secondary | ICD-10-CM | POA: Diagnosis not present

## 2022-06-15 NOTE — Telephone Encounter (Signed)
Can we find out if patient has recurrent ascites?   What diuretics is she taking? I will need to start her on low dose if she is not on anything already .

## 2022-06-15 NOTE — Telephone Encounter (Signed)
Pt called back regarding message from 5/30 and her [place]. I advised the pt that the Dr has 5 to 10 business days to get the results sent to [place] then we report to the pt. Pt expressed understanding

## 2022-06-16 ENCOUNTER — Other Ambulatory Visit: Payer: Self-pay | Admitting: *Deleted

## 2022-06-16 DIAGNOSIS — R188 Other ascites: Secondary | ICD-10-CM

## 2022-06-16 NOTE — Telephone Encounter (Signed)
noted 

## 2022-06-16 NOTE — Telephone Encounter (Signed)
Phoned and advised the pt of the recommendations, diuretic regimen when Andrea Dickson returns, and her appt @ [facility] tomorrow and time. Pt expressed understanding and thanks

## 2022-06-16 NOTE — Telephone Encounter (Signed)
We can arrange for paracentesis.  She will likely need to increase/add to her diuretic regimen, but I will leave this to Tobi Bastos when she returns Thursday.  Tammy/Mindy:  Please arrange paracentesis.  No diuretics day of paracentesis.  Max 5 L 25g of 25% IV albumin at the start.  Labs including body fluid cell count, culture, gram stain.

## 2022-06-16 NOTE — Telephone Encounter (Signed)
Phoned and spoke with the pt regarding this note and result note from procedure with Anna's message. Pt is on Spironolactone 25mg  once a day from Dr Leandrew Koyanagi. Pt does feel like she needs a para and wanted to know if we could do a standing order for her. (Pt is aware that Tobi Bastos is out of the office today and tomorrow). Pt states she couldn't see the message from Tobi Bastos only other Dr's. Once I receive from you what she needs to do I will speak with her regarding her MyChart. Please advise

## 2022-06-16 NOTE — Telephone Encounter (Signed)
PARA scheduled for tomorrow 6/5, arrival 10:30am, [facility]. Called pt, LMOVM to call back

## 2022-06-17 ENCOUNTER — Ambulatory Visit (HOSPITAL_COMMUNITY)
Admission: RE | Admit: 2022-06-17 | Discharge: 2022-06-17 | Disposition: A | Discharge status: Home / Self Care | Payer: 59 | Source: Ambulatory Visit | Attending: Gastroenterology | Admitting: Gastroenterology

## 2022-06-17 DIAGNOSIS — R188 Other ascites: Secondary | ICD-10-CM

## 2022-06-17 LAB — GRAM STAIN: Gram Stain: NONE SEEN

## 2022-06-17 LAB — BODY FLUID CELL COUNT WITH DIFFERENTIAL
Eos, Fluid: 0 %
Lymphs, Fluid: 84 %
Monocyte-Macrophage-Serous Fluid: 11 % — ABNORMAL LOW (ref 50–90)
Neutrophil Count, Fluid: 5 % (ref 0–25)
Total Nucleated Cell Count, Fluid: 494 cu mm (ref 0–1000)

## 2022-06-17 MED ORDER — ALBUMIN HUMAN 25 % IV SOLN
25.0000 g | Freq: Once | INTRAVENOUS | Status: AC
  Administered 2022-06-17: 25 g via INTRAVENOUS

## 2022-06-17 MED ORDER — ALBUMIN HUMAN 25 % IV SOLN
INTRAVENOUS | Status: AC
  Filled 2022-06-17: qty 100

## 2022-06-17 NOTE — Procedures (Signed)
Interventional Radiology Procedure Note  Procedure: Image guided paracentesis Complications: None  EBL: None Sample: labs sent  Recommendations: - Routine care - follow up labs    Signed,  Yvone Neu. Loreta Ave, DO

## 2022-06-17 NOTE — Progress Notes (Signed)
Pt transported to [place] 3 per WC in no obvious distress. Abdomen distended, nontender. Paracentesis explained, consent obtained. Prepped and draped in sterile manner. Local anesthetic admin without adverse reaction, access obtained at RLQ without complication. Clear serous fluid aspirated for lab, connected to vacutainer suction. 4.6 L retrieved total. No obvious distress. Tolerated well. Access removed, dermabond applied, hemostasis achieved, bandage in place.

## 2022-06-18 ENCOUNTER — Other Ambulatory Visit: Payer: Self-pay

## 2022-06-18 DIAGNOSIS — K7460 Unspecified cirrhosis of liver: Secondary | ICD-10-CM

## 2022-06-18 LAB — PATHOLOGIST SMEAR REVIEW

## 2022-06-18 LAB — CULTURE, BODY FLUID W GRAM STAIN -BOTTLE: Culture: NO GROWTH

## 2022-06-18 MED ORDER — FUROSEMIDE 20 MG PO TABS: 20.0000 mg | ORAL_TABLET | Freq: Every day | ORAL | 2 refills | Status: DC

## 2022-06-18 MED ORDER — SPIRONOLACTONE 50 MG PO TABS: 50.0000 mg | ORAL_TABLET | Freq: Every day | ORAL | 1 refills | Status: DC

## 2022-06-18 NOTE — Addendum Note (Signed)
Addended by: Gelene Mink on: 06/18/2022 01:58 PM   Modules accepted: Orders

## 2022-06-18 NOTE — Telephone Encounter (Signed)
Mindy/Tammy:  Received note from RN s/p para that patient became orthostatic following para. For her standing orders: Please increase albumin to 50 g  IV albumin at start of para. NO diuretics day of para. Max 5 liters.     Dena: please let patient know I am increasing her spironolactone to 50 mg each morning and adding  lasix 20 mg each morning. I sent this to [facility]. Please have her start this and do a BMP 1 week after starting.  Mandy: we need patient to be seen sooner than end of June. Please arrange next week if possible.

## 2022-06-18 NOTE — Telephone Encounter (Signed)
She doesn't have standing orders. Domingo Mend for 1 para and wait for your return. If you are wanting her to have standing order, please advise for how long? Thanks!

## 2022-06-18 NOTE — Telephone Encounter (Signed)
Phoned and advised the pt of the medication change, how much to take and have labs done one week after starting the medication. Pt expressed understanding. Labs placed @ [facility] (pt's request).

## 2022-06-20 DIAGNOSIS — S161XXA Strain of muscle, fascia and tendon at neck level, initial encounter: Secondary | ICD-10-CM | POA: Diagnosis not present

## 2022-06-20 DIAGNOSIS — Z743 Need for continuous supervision: Secondary | ICD-10-CM | POA: Diagnosis not present

## 2022-06-20 DIAGNOSIS — M47812 Spondylosis without myelopathy or radiculopathy, cervical region: Secondary | ICD-10-CM | POA: Diagnosis not present

## 2022-06-20 DIAGNOSIS — R519 Headache, unspecified: Secondary | ICD-10-CM | POA: Diagnosis not present

## 2022-06-20 DIAGNOSIS — K7460 Unspecified cirrhosis of liver: Secondary | ICD-10-CM | POA: Diagnosis not present

## 2022-06-20 DIAGNOSIS — M542 Cervicalgia: Secondary | ICD-10-CM | POA: Diagnosis not present

## 2022-06-20 DIAGNOSIS — M545 Low back pain, unspecified: Secondary | ICD-10-CM | POA: Diagnosis not present

## 2022-06-20 DIAGNOSIS — F17210 Nicotine dependence, cigarettes, uncomplicated: Secondary | ICD-10-CM | POA: Diagnosis not present

## 2022-06-20 DIAGNOSIS — M549 Dorsalgia, unspecified: Secondary | ICD-10-CM | POA: Diagnosis not present

## 2022-06-20 LAB — CULTURE, BODY FLUID W GRAM STAIN -BOTTLE

## 2022-06-22 ENCOUNTER — Ambulatory Visit: Payer: 59 | Admitting: Urology

## 2022-06-23 LAB — CULTURE, BODY FLUID W GRAM STAIN -BOTTLE

## 2022-06-24 ENCOUNTER — Ambulatory Visit (INDEPENDENT_AMBULATORY_CARE_PROVIDER_SITE_OTHER): Payer: 59 | Admitting: Gastroenterology

## 2022-06-24 ENCOUNTER — Encounter: Payer: Self-pay | Admitting: Gastroenterology

## 2022-06-24 ENCOUNTER — Other Ambulatory Visit: Payer: Self-pay | Admitting: *Deleted

## 2022-06-24 VITALS — BP 113/64 | HR 66 | Temp 96.6°F | Ht 66.0 in | Wt 145.7 lb

## 2022-06-24 DIAGNOSIS — R188 Other ascites: Secondary | ICD-10-CM

## 2022-06-24 DIAGNOSIS — K7460 Unspecified cirrhosis of liver: Secondary | ICD-10-CM | POA: Diagnosis not present

## 2022-06-24 NOTE — Patient Instructions (Addendum)
We are starting Lasix 20 milligrams and increasing spironolactone to 50 milligrams daily. Take these at same time each morning.  Please go to [facility] next Wednesday to have blood work done. We are checking your liver and kidneys.  I am waiting to hear back from radiology about the best imaging to do to further evaluate your liver!  It is important to follow a 2 gram sodium diet. I know you are already trying to do this. There is no fluid restriction!  I have asked for 2 standing orders for a para. If you feel you need one, call [phone], and they will arrange it. Do not take your fluid pills (lasix and aldactone) on the day you have the para.  I will see you in 6 weeks!  I enjoyed seeing you again today! I value our relationship and want to provide genuine, compassionate, and quality care. You may receive a survey regarding your visit with me, and I welcome your feedback! Thanks so much for taking the time to complete this. I look forward to seeing you again.      Gelene Mink, PhD, ANP-BC [facility]

## 2022-06-24 NOTE — Progress Notes (Signed)
Gastroenterology Office Note     Primary Care Physician:  Juliette Alcide, MD  Primary Gastroenterologist: Dr. Jena Gauss    Chief Complaint   Chief Complaint  Patient presents with   Follow-up    Patient here today for a follow up on her Cirrhosis of liver with ascites. Patient says she does have some issues with abdominal swelling and has had two recent paradentitises. She says she has not started Lasix 20 mg qd, this should come in the mail today per patient. She is taking Spironolactone 25 mg once per and is awaiting the 50 mg tablets also. She denies any current memory issues.     History of Present Illness   Andrea Dickson is a 72 y.o. female presenting today in follow-up with a history of  MASH cirrhosis, chronic diarrhea, chronic nausea. She was seen with new onset ascites in May 2024, undergoing first-ever para on 5/15 with 4.8 liters removed. She has required a second para on 6/5 with 4.6 liters removed.   Regarding cirrhosis care, she is uptodate with last EGD Jan 2024 as noted below. Colonoscopy in 2020 unrevealing. We have updated labs including AFP tumor marker, INR, HFP. She has had a mild bump in alk phos and Tbili (Alk Phos 130, Tbili 1.4), previously normal. AFP tumor marker normal. Doppler ultrasound without PVT but unable to visualize splenic vein. US abdomen this month without obvious liver lesions.   Doesn't add salt food. Stays with mother during the day. Will pick up something to go from restaurant. Has mild right-sided discomfort following para (soreness). No overt GI bleeding. No mental status changes or confusion. Denies jaundice or pruritus.  History of chronic diarrhea with evaluation including: trial of Xifaxan in Jan 2024 for IBS-D. Negative colonic biopsies in past, negative celiac screen, trial off metformin without improvement, fecal elastase normal. Will have intermittent soft and loose stools. Dicyclomine without improvement in the past. Trial of  Zenpep samples noted improvement. She is having normal BMs currently.    Para on 5/15 : 4.8 liters Para on 6/5: 4.6 liters   EGD 01/19/2022: -3 columns of grade 2/limited grade 3 varices -Portal hypertensive gastropathy -Friable gastric mucosa -Continue Inderal daily -No repeat EGD needed as long as no evidence of upper GI bleed     Colonoscopy Jan 2020: colonic lipoma, tubular adenomas, random colonic biopsies negative.      Last [place] Jan 2024: cirrhosis with moderate splenomegaly.          Past Medical History:  Diagnosis Date   Anxiety     Asthma     Bipolar 1 disorder (HCC)     Chronic diarrhea     Cirrhosis (HCC)      Diagnosed September 2020, likely secondary to [facility], s/p Hep A/B vaccination in 2021.    Depression     Diabetes (HCC)     Essential tremor     GERD (gastroesophageal reflux disease)     Headache     High cholesterol             Past Surgical History:  Procedure Laterality Date   ABDOMINAL HYSTERECTOMY   1997   BIOPSY   01/17/2018    Procedure: BIOPSY;  Surgeon: Corbin Ade, MD;  [facility];  Service: Endoscopy;;  colon   carpal tunnel Bilateral 1999, 06/2009   COLONOSCOPY   2013    Dr. Teena Dunk: no colon polyps. quality of prep was fair. repeat colonoscopy in five  years.    COLONOSCOPY WITH PROPOFOL N/A 01/17/2018    Dr. Jena Gauss: Colonic lipoma, 2 tubular adenomas removed.  Random colon biopsies negative.  Next colonoscopy 5 years.   ESOPHAGOGASTRODUODENOSCOPY (EGD) WITH PROPOFOL N/A 04/06/2019    Procedure: ESOPHAGOGASTRODUODENOSCOPY (EGD) WITH PROPOFOL;  Surgeon: Corbin Ade, MD;   Normal esophagus, mild portal hypertensive gastropathy, otherwise normal exam.  Recommend repeat EGD in 2 years for variceal screening.    ESOPHAGOGASTRODUODENOSCOPY (EGD) WITH PROPOFOL N/A 01/19/2022    Procedure: ESOPHAGOGASTRODUODENOSCOPY (EGD) WITH PROPOFOL;  Surgeon: Corbin Ade, MD;  [facility];  Service: Endoscopy;  Laterality: N/A;   12:45 pm, pt knows to arrive at 9:15   POLYPECTOMY   01/17/2018    Procedure: POLYPECTOMY;  Surgeon: Corbin Ade, MD;  [facility];  Service: Endoscopy;;  colon   SKIN CANCER EXCISION   2007   TONSILLECTOMY   1965   ulner nerve  Left 06/2009    decompression            Current Outpatient Medications  Medication Sig Dispense Refill   acetaminophen (TYLENOL) 500 MG tablet Take 1,000 mg by mouth 2 (two) times daily as needed for moderate pain or headache.       buPROPion (WELLBUTRIN XL) 150 MG 24 hr tablet Take 1 tablet (150 mg total) by mouth every morning. 30 tablet 2   cetirizine (ZYRTEC) 10 MG tablet Take 10 mg by mouth daily. Aller-Tec       Cholecalciferol (VITAMIN D3) 50 MCG (2000 UT) TABS Take 2,000 Units by mouth daily.        diazepam (VALIUM) 10 MG tablet TAKE ONE TABLET BY MOUTH EVERYDAY AT BEDTIME 60 tablet 2   diazepam (VALIUM) 2 MG tablet Take 1 tablet (2 mg total) by mouth daily as needed for anxiety. 30 tablet 2   estrogens, conjugated, (PREMARIN) 1.25 MG tablet Take 1.25 mg by mouth daily.        ezetimibe (ZETIA) 10 MG tablet Take 10 mg by mouth daily.       HYDROcodone-acetaminophen (NORCO/VICODIN) 5-325 MG tablet Take 1 tablet by mouth every 8 (eight) hours as needed.       hyoscyamine (LEVSIN SL) 0.125 MG SL tablet Place 1 tablet (0.125 mg total) under the tongue every 6 (six) hours as needed for cramping. 30 tablet 1   Lactobacillus-Inulin (CULTURELLE DIGESTIVE HEALTH) CAPS Take 1 capsule by mouth daily.       loperamide (IMODIUM) 2 MG capsule Take 2 mg by mouth daily as needed for diarrhea or loose stools.       nitrofurantoin (MACRODANTIN) 50 MG capsule Take 1 capsule (50 mg total) by mouth at bedtime. 30 capsule 11   omeprazole (PRILOSEC) 40 MG capsule TAKE ONE CAPSULE BY MOUTH AT BREAKFAST AND AT BEDTIME 60 capsule 5   ondansetron (ZOFRAN) 4 MG tablet TAKE ONE TABLET BY MOUTH EVERY 8 HOURS AS NEEDED FOR NAUSEA AND VOMITING 20 tablet 0   propranolol ER  (INDERAL LA) 80 MG 24 hr capsule Take 1 capsule (80 mg total) by mouth daily. 30 capsule 2    No current facility-administered medications for this visit.           Allergies as of 05/27/2022 - Review Complete 05/27/2022  Allergen Reaction Noted   Pramipexole Other (See Comments) 04/24/2015   Crestor [rosuvastatin calcium]   03/28/2019   Bee pollen Other (See Comments) 04/24/2015   Pollen extract Other (See Comments) 04/24/2015  Family History  Problem Relation Age of Onset   Diabetes Mother     Brain cancer Mother     Diabetes Father     Heart attack Father     Heart disease Father     Diabetes Sister     Bipolar disorder Brother     Diabetes Brother     Heart disease Brother     Colon cancer Neg Hx     Breast cancer Neg Hx        Social History         Socioeconomic History   Marital status: Divorced      Spouse name: Not on file   Number of children: 1   Years of education: 12   Highest education level: Not on file  Occupational History   Occupation: retired      Comment: employed at [employer]  Tobacco Use   Smoking status: Every Day      Packs/day: 0.50      Years: 43.00      Additional pack years: 0.00      Total pack years: 21.50      Types: Cigarettes   Smokeless tobacco: Never   Tobacco comments:      smoking since 72yrs old.    Vaping Use   Vaping Use: Never used  Substance and Sexual Activity   Alcohol use: No      Alcohol/week: 0.0 standard drinks of alcohol   Drug use: No   Sexual activity: Not Currently  Other Topics Concern   Not on file  Social History Narrative    Lives alone.  Retired.  Education 12th grade.  One child.      Caffeine use- sodas, 2 daily    Social Determinants of Health    Financial Resource Strain: Not on file  Food Insecurity: Not on file  Transportation Needs: Not on file  Physical Activity: Not on file  Stress: Not on file  Social Connections: Not on file  Intimate Partner Violence: Not on  file        Review of Systems    Gen: Denies any fever, chills, fatigue, weight loss, lack of appetite.  CV: Denies chest pain, heart palpitations, peripheral edema, syncope.  Resp: Denies shortness of breath at rest or with exertion. Denies wheezing or cough.  GI: Denies dysphagia or odynophagia. Denies jaundice, hematemesis, fecal incontinence. GU : Denies urinary burning, urinary frequency, urinary hesitancy MS: Denies joint pain, muscle weakness, cramps, or limitation of movement.  Derm: Denies rash, itching, dry skin Psych: Denies depression, anxiety, memory loss, and confusion Heme: Denies bruising, bleeding, and enlarged lymph nodes.     Physical Exam    BP 128/72 (BP Location: Left Arm, Patient Position: Sitting, Cuff Size: Large)   Pulse 70   Temp 97.8 F (36.6 C) (Temporal)   Ht 5\' 6"  (1.676 m)   Wt 160 lb 9.6 oz (72.8 kg)   BMI 25.92 kg/m  General:   Alert and oriented. Pleasant and cooperative. Well-nourished and well-developed.  Head:  Normocephalic and atraumatic. Eyes:  Without icterus Abdomen:  +BS, distended, tense, TTP lower abdomen, no rebound or guarding Rectal:  Deferred  Msk:  Symmetrical without gross deformities. Normal posture. Extremities:  mild ankle edema  Neurologic:  Alert and  oriented x4;  grossly normal neurologically. Skin:  Intact without significant lesions or rashes. Psych:  Alert and cooperative. Normal mood and affect.     Assessment    Andrea Dickson  is a 72 y.o. female presenting today in follow-up with a history of  MASH cirrhosis, chronic diarrhea, chronic nausea. She is here today due to new onset abdominal distension and concern for ascites. Saw PCP yesterday.    Decompensated MASH cirrhosis: unclear etiology of new onset ascites. No prior need for para. Afebrile, low concern for SBP. Chronic abdominal pain at baseline. Will have [place] para with fluid analysis today. We need to add labs, AFP tumor marker, update US abdomen now,  and also perform doppler study to assess for PVT. May need more extensive imaging depending on findings. Low sodium diet.    Diarrhea: chronic. Despite fecal elastase being normal, she could still have underlying EPI. Will trial Zenpep 40,000 units one with meals and one with snacks, with room to titrate up as needed. Extensive eval as noted in HPI.          Colonoscopy Jan 2020: colonic lipoma, tubular adenomas, random colonic biopsies negative.      Last [place] Jan 2024: cirrhosis with moderate splenomegaly.      PLAN      [place] para with fluid analysis US abdomen complete with doppler to evaluate for PVT MELD labs along with AFP Trial of Zenpep 40,000 units with meals and snacks: room to titrate if needed 2 gram sodium diet Close follow-up in 6 weeks     Gelene Mink, PhD, ANP-BC [facility]          Past Medical History:  Diagnosis Date   Anxiety    Asthma    Bipolar 1 disorder [facility])    Chronic diarrhea    Cirrhosis (HCC)    Diagnosed September 2020, likely secondary to [facility], s/p Hep A/B vaccination in 2021.    Depression    Diabetes (HCC)    Essential tremor    GERD (gastroesophageal reflux disease)    Headache    High cholesterol     Past Surgical History:  Procedure Laterality Date   ABDOMINAL HYSTERECTOMY  1997   BIOPSY  01/17/2018   Procedure: BIOPSY;  Surgeon: Corbin Ade, MD;  [facility];  Service: Endoscopy;;  colon   carpal tunnel Bilateral 1999, 06/2009   COLONOSCOPY  2013   Dr. Teena Dunk: no colon polyps. quality of prep was fair. repeat colonoscopy in five years.    COLONOSCOPY WITH PROPOFOL N/A 01/17/2018   Dr. Jena Gauss: Colonic lipoma, 2 tubular adenomas removed.  Random colon biopsies negative.  Next colonoscopy 5 years.   ESOPHAGOGASTRODUODENOSCOPY (EGD) WITH PROPOFOL N/A 04/06/2019   Procedure: ESOPHAGOGASTRODUODENOSCOPY (EGD) WITH PROPOFOL;  Surgeon: Corbin Ade, MD;   Normal esophagus, mild portal hypertensive  gastropathy, otherwise normal exam.  Recommend repeat EGD in 2 years for variceal screening.    ESOPHAGOGASTRODUODENOSCOPY (EGD) WITH PROPOFOL N/A 01/19/2022   Procedure: ESOPHAGOGASTRODUODENOSCOPY (EGD) WITH PROPOFOL;  Surgeon: Corbin Ade, MD;  [facility];  Service: Endoscopy;  Laterality: N/A;  12:45 pm, pt knows to arrive at 9:15   POLYPECTOMY  01/17/2018   Procedure: POLYPECTOMY;  Surgeon: Corbin Ade, MD;  [facility];  Service: Endoscopy;;  colon   SKIN CANCER EXCISION  2007   TONSILLECTOMY  1965   ulner nerve  Left 06/2009   decompression    Current Outpatient Medications  Medication Sig Dispense Refill   acetaminophen (TYLENOL) 500 MG tablet Take 1,000 mg by mouth 2 (two) times daily as needed for moderate pain or headache.     buPROPion (WELLBUTRIN XL)  150 MG 24 hr tablet Take 1 tablet (150 mg total) by mouth every morning. 30 tablet 2   cetirizine (ZYRTEC) 10 MG tablet Take 10 mg by mouth daily. Aller-Tec     Cholecalciferol (VITAMIN D3) 50 MCG (2000 UT) TABS Take 2,000 Units by mouth daily.      cyclobenzaprine (FLEXERIL) 10 MG tablet Take 10 mg by mouth at bedtime.     diazepam (VALIUM) 10 MG tablet TAKE ONE TABLET BY MOUTH EVERYDAY AT BEDTIME 60 tablet 2   diazepam (VALIUM) 2 MG tablet Take 1 tablet (2 mg total) by mouth daily as needed for anxiety. 30 tablet 2   estrogens, conjugated, (PREMARIN) 1.25 MG tablet Take 1.25 mg by mouth daily.      ezetimibe (ZETIA) 10 MG tablet Take 10 mg by mouth daily.     HYDROcodone-acetaminophen (NORCO/VICODIN) 5-325 MG tablet Take 1 tablet by mouth every 8 (eight) hours as needed.     hyoscyamine (LEVSIN SL) 0.125 MG SL tablet Place 1 tablet (0.125 mg total) under the tongue every 6 (six) hours as needed for cramping. 30 tablet 1   loperamide (IMODIUM) 2 MG capsule Take 2 mg by mouth daily as needed for diarrhea or loose stools.     nitrofurantoin (MACRODANTIN) 50 MG capsule Take 1 capsule (50 mg total) by mouth  at bedtime. 30 capsule 11   omeprazole (PRILOSEC) 40 MG capsule TAKE ONE CAPSULE BY MOUTH AT BREAKFAST AND AT BEDTIME 60 capsule 5   ondansetron (ZOFRAN) 4 MG tablet TAKE ONE TABLET BY MOUTH EVERY 8 HOURS AS NEEDED FOR NAUSEA AND VOMITING 20 tablet 0   propranolol ER (INDERAL LA) 80 MG 24 hr capsule Take 1 capsule (80 mg total) by mouth daily. 30 capsule 2   furosemide (LASIX) 20 MG tablet Take 1 tablet (20 mg total) by mouth daily. (Patient not taking: Reported on 06/24/2022) 30 tablet 2   Lactobacillus-Inulin (CULTURELLE DIGESTIVE HEALTH) CAPS Take 1 capsule by mouth daily.     spironolactone (ALDACTONE) 50 MG tablet Take 1 tablet (50 mg total) by mouth daily. With 20 milligrams of Lasix each morning. 30 tablet 1   No current facility-administered medications for this visit.    Allergies as of 06/24/2022 - Review Complete 06/24/2022  Allergen Reaction Noted   Pramipexole Other (See Comments) 04/24/2015   Crestor [rosuvastatin calcium]  03/28/2019   Bee pollen Other (See Comments) 04/24/2015   Pollen extract Other (See Comments) 04/24/2015    Family History  Problem Relation Age of Onset   Diabetes Mother    Brain cancer Mother    Diabetes Father    Heart attack Father    Heart disease Father    Diabetes Sister    Bipolar disorder Brother    Diabetes Brother    Heart disease Brother    Colon cancer Neg Hx    Breast cancer Neg Hx     Social History   Socioeconomic History   Marital status: Divorced    Spouse name: Not on file   Number of children: 1   Years of education: 12   Highest education level: Not on file  Occupational History   Occupation: retired    Comment: employed at [employer]  Tobacco Use   Smoking status: Every Day    Packs/day: 0.50    Years: 43.00    Additional pack years: 0.00    Total pack years: 21.50    Types: Cigarettes   Smokeless tobacco: Never   Tobacco comments:  smoking since 73yrs old.    Vaping Use   Vaping Use: Never used   Substance and Sexual Activity   Alcohol use: No    Alcohol/week: 0.0 standard drinks of alcohol   Drug use: No   Sexual activity: Not Currently  Other Topics Concern   Not on file  Social History Narrative   Lives alone.  Retired.  Education 12th grade.  One child.     Caffeine use- sodas, 2 daily   Social Determinants of Health   Financial Resource Strain: Not on file  Food Insecurity: Not on file  Transportation Needs: Not on file  Physical Activity: Not on file  Stress: Not on file  Social Connections: Not on file  Intimate Partner Violence: Not on file     Review of Systems   Gen: Denies any fever, chills, fatigue, weight loss, lack of appetite.  CV: Denies chest pain, heart palpitations, peripheral edema, syncope.  Resp: Denies shortness of breath at rest or with exertion. Denies wheezing or cough.  GI: Denies dysphagia or odynophagia. Denies jaundice, hematemesis, fecal incontinence. GU : Denies urinary burning, urinary frequency, urinary hesitancy MS: Denies joint pain, muscle weakness, cramps, or limitation of movement.  Derm: Denies rash, itching, dry skin Psych: Denies depression, anxiety, memory loss, and confusion Heme: Denies bruising, bleeding, and enlarged lymph nodes.   Physical Exam   BP 113/64 (BP Location: Right Arm, Patient Position: Sitting, Cuff Size: Normal)   Pulse 66   Temp (!) 96.6 F (35.9 C) (Temporal)   Ht 5\' 6"  (1.676 m)   Wt 145 lb 11.2 oz (66.1 kg)   BMI 23.52 kg/m  General:   Alert and oriented. Pleasant and cooperative. Well-nourished and well-developed.  Head:  Normocephalic and atraumatic. Eyes:  Without icterus Abdomen:  +BS, soft, mildly distended with non-tense small amount of ascites, slight TTP at site or prior para.  Rectal:  Deferred  Msk:  Symmetrical without gross deformities. Normal posture. Extremities:  Without edema. Neurologic:  Alert and  oriented x4;  grossly normal neurologically. Skin:  Intact without  significant lesions or rashes. Psych:  Alert and cooperative. Normal mood and affect.   Assessment   Andrea Dickson is a 72 y.o. female presenting today with a history of MASH cirrhosis, chronic diarrhea, chronic nausea, now with decompensated cirrhosis with ascites onset in May 2024 requiring 2 paras.   Thus far, [place] has been without obvious liver lesions, doppler US with patent portal vein but splenic vein was not visualized, AFP tumor marker normal, MELD 3.0 was 10, previously normal LFTs now with Alk Phos 130 and Tbili 1.4.   She is starting low dose diuretics today (furosemide 20 mg and spironolactone 50 mg). We will recheck labs in 1 week, as we certainly have room to titrate up. I have also placed orders for 2 standing paras, with 50g IV albumin at start and max of 5 liters as she became orthostatic with last para.   I have reached out to radiology regarding doppler ultrasound as splenic vein not visualized. Anticipate CT liver vs MRI liver. Unclear why she has had this new onset ascites, and we are ruling out any occult malignancy.   Chronic diarrhea: doing well today. She noted improvement with Zenpep samples. She is not having diarrhea currently. She is to call if this returns, and I will send in a prescription.   As of note, EGD on file from Jan 2024. She endorses attempting to follow a low sodium diet,  but I suspect with eating out, she is intaking more sodium than she realizes.    PLAN    Discussing with radiology next best imaging; I would like to make sure no splenic vein occlusion. CT Liver vs MRI liver Start 20 mg lasix, 50 mg aldactone, recheck BMP in 1 week, titrate up as needed Low sodium diet. No fluid restriction Repeat HFP and INR along with BMP in 1 week Call if recurrent diarrhea 2 standing order paras with 50g IV albumin at start 6 week close follow-up   Gelene Mink, PhD, ANP-BC [facility]enter[facility]

## 2022-06-29 ENCOUNTER — Telehealth: Payer: Self-pay | Admitting: Gastroenterology

## 2022-06-29 DIAGNOSIS — K7460 Unspecified cirrhosis of liver: Secondary | ICD-10-CM

## 2022-06-29 NOTE — Telephone Encounter (Signed)
I spoke with Dr. Elby Showers (IR). He is recommending CTA abdomen/pelvis with BRTO protocol. This will help screen for any liver lesions and can look at the portal venous system better than ultrasound or MRI.   Please let her know this, as I had told her last week that I was looking into the best imaging for her. Please arrange. I placed the orders.  Thanks!

## 2022-06-29 NOTE — Telephone Encounter (Signed)
Mindy/Tammy: please see message below. Thanks!

## 2022-06-29 NOTE — Addendum Note (Signed)
Addended by: Gelene Mink on: 06/29/2022 02:13 PM   Modules accepted: Orders

## 2022-07-01 ENCOUNTER — Telehealth (HOSPITAL_COMMUNITY): Payer: 59 | Admitting: Psychiatry

## 2022-07-01 DIAGNOSIS — R188 Other ascites: Secondary | ICD-10-CM | POA: Diagnosis not present

## 2022-07-01 DIAGNOSIS — K7460 Unspecified cirrhosis of liver: Secondary | ICD-10-CM | POA: Diagnosis not present

## 2022-07-02 LAB — PROTIME-INR
INR: 1.1 (ref 0.9–1.2)
Prothrombin Time: 11.4 s (ref 9.1–12.0)

## 2022-07-02 LAB — HEPATIC FUNCTION PANEL
ALT: 10 IU/L (ref 0–32)
AST: 21 IU/L (ref 0–40)
Albumin: 3.8 g/dL (ref 3.8–4.8)
Alkaline Phosphatase: 161 IU/L — ABNORMAL HIGH (ref 44–121)
Bilirubin Total: 0.7 mg/dL (ref 0.0–1.2)
Bilirubin, Direct: 0.21 mg/dL (ref 0.00–0.40)
Total Protein: 6.5 g/dL (ref 6.0–8.5)

## 2022-07-08 ENCOUNTER — Ambulatory Visit: Payer: 59 | Admitting: Gastroenterology

## 2022-07-09 ENCOUNTER — Encounter (HOSPITAL_COMMUNITY): Payer: Self-pay | Admitting: Psychiatry

## 2022-07-09 ENCOUNTER — Telehealth (INDEPENDENT_AMBULATORY_CARE_PROVIDER_SITE_OTHER): Payer: 59 | Admitting: Psychiatry

## 2022-07-09 DIAGNOSIS — F3162 Bipolar disorder, current episode mixed, moderate: Secondary | ICD-10-CM | POA: Diagnosis not present

## 2022-07-09 MED ORDER — PROPRANOLOL HCL ER 80 MG PO CP24: 80.0000 mg | ORAL_CAPSULE | Freq: Every day | ORAL | 2 refills | Status: DC

## 2022-07-09 MED ORDER — DIAZEPAM 2 MG PO TABS: 2.0000 mg | ORAL_TABLET | Freq: Every day | ORAL | 2 refills | Status: DC | PRN

## 2022-07-09 MED ORDER — DIAZEPAM 10 MG PO TABS: ORAL_TABLET | ORAL | 2 refills | Status: DC

## 2022-07-09 MED ORDER — BUPROPION HCL ER (XL) 150 MG PO TB24: 150.0000 mg | ORAL_TABLET | Freq: Every morning | ORAL | 2 refills | Status: DC

## 2022-07-09 NOTE — Progress Notes (Signed)
Virtual Visit via Telephone Note  I connected with Andrea Dickson on 07/09/22 at  3:00 PM EDT by telephone and verified that I am speaking with the correct person using two identifiers.  Location: Patient: home Provider: office   I discussed the limitations, risks, security and privacy concerns of performing an evaluation and management service by telephone and the availability of in person appointments. I also discussed with the patient that there may be a patient responsible charge related to this service. The patient expressed understanding and agreed to proceed.       I discussed the assessment and treatment plan with the patient. The patient was provided an opportunity to ask questions and all were answered. The patient agreed with the plan and demonstrated an understanding of the instructions.   The patient was advised to call back or seek an in-person evaluation if the symptoms worsen or if the condition fails to improve as anticipated.  I provided 15 minutes of non-face-to-face time during this encounter.   Diannia Ruder, MD  [facility] MD/PA/NP OP Progress Note  07/09/2022 3:12 PM Andrea Dickson  MRN:  [id number]  Chief Complaint:  Chief Complaint  Patient presents with   Depression   Manic Behavior   Follow-up   HPI: : This patient is a 72 year old separated white female who lives alone in [place].  She has 1 son and 3 grandchildren.  She used to work as a Freight forwarder for [employer].  She has a history of depression anxiety and possible bipolar disorder.   The patient returns for follow-up after 3 months.  For the most part she is doing well.  Her sister is no longer staying with her stepmother so she is having less conflicts with the sister.  Overall her mood has been good and she denies significant depression or anxiety.  She is sleeping well.  She denies racing thoughts or other manic symptoms or suicidal ideation.  She still complains of tremor in her hands  although the shaking feeling on the inside in her head shaking have stopped.  The propranolol has helped all this.  We discussed possibly going up on it but she is on other medications to reduce blood pressure and her blood pressure runs low already.  Right now she is dealing okay with that. Visit Diagnosis:    ICD-10-CM   1. Bipolar 1 disorder, mixed, moderate (HCC)  F31.62       Past Psychiatric History: Past outpatient treatment  Past Medical History:  Past Medical History:  Diagnosis Date   Anxiety    Asthma    Bipolar 1 disorder (HCC)    Chronic diarrhea    Cirrhosis (HCC)    Diagnosed September 2020, likely secondary to [facility], s/p Hep A/B vaccination in 2021.    Depression    Diabetes (HCC)    Essential tremor    GERD (gastroesophageal reflux disease)    Headache    High cholesterol     Past Surgical History:  Procedure Laterality Date   ABDOMINAL HYSTERECTOMY  1997   BIOPSY  01/17/2018   Procedure: BIOPSY;  Surgeon: Corbin Ade, MD;  [facility];  Service: Endoscopy;;  colon   carpal tunnel Bilateral 1999, 06/2009   COLONOSCOPY  2013   Dr. Teena Dunk: no colon polyps. quality of prep was fair. repeat colonoscopy in five years.    COLONOSCOPY WITH PROPOFOL N/A 01/17/2018   Dr. Jena Gauss: Colonic lipoma, 2 tubular adenomas removed.  Random colon biopsies  negative.  Next colonoscopy 5 years.   ESOPHAGOGASTRODUODENOSCOPY (EGD) WITH PROPOFOL N/A 04/06/2019   Procedure: ESOPHAGOGASTRODUODENOSCOPY (EGD) WITH PROPOFOL;  Surgeon: Corbin Ade, MD;   Normal esophagus, mild portal hypertensive gastropathy, otherwise normal exam.  Recommend repeat EGD in 2 years for variceal screening.    ESOPHAGOGASTRODUODENOSCOPY (EGD) WITH PROPOFOL N/A 01/19/2022   Procedure: ESOPHAGOGASTRODUODENOSCOPY (EGD) WITH PROPOFOL;  Surgeon: Corbin Ade, MD;  [facility];  Service: Endoscopy;  Laterality: N/A;  12:45 pm, pt knows to arrive at 9:15   POLYPECTOMY  01/17/2018   Procedure:  POLYPECTOMY;  Surgeon: Corbin Ade, MD;  [facility];  Service: Endoscopy;;  colon   SKIN CANCER EXCISION  2007   TONSILLECTOMY  1965   ulner nerve  Left 06/2009   decompression    Family Psychiatric History: See below  Family History:  Family History  Problem Relation Age of Onset   Diabetes Mother    Brain cancer Mother    Diabetes Father    Heart attack Father    Heart disease Father    Diabetes Sister    Bipolar disorder Brother    Diabetes Brother    Heart disease Brother    Colon cancer Neg Hx    Breast cancer Neg Hx     Social History:  Social History   Socioeconomic History   Marital status: Divorced    Spouse name: Not on file   Number of children: 1   Years of education: 12   Highest education level: Not on file  Occupational History   Occupation: retired    Comment: employed at [employer]  Tobacco Use   Smoking status: Every Day    Packs/day: 0.50    Years: 43.00    Additional pack years: 0.00    Total pack years: 21.50    Types: Cigarettes   Smokeless tobacco: Never   Tobacco comments:    smoking since 72yrs old.    Vaping Use   Vaping Use: Never used  Substance and Sexual Activity   Alcohol use: No    Alcohol/week: 0.0 standard drinks of alcohol   Drug use: No   Sexual activity: Not Currently  Other Topics Concern   Not on file  Social History Narrative   Lives alone.  Retired.  Education 12th grade.  One child.     Caffeine use- sodas, 2 daily   Social Determinants of Health   Financial Resource Strain: Not on file  Food Insecurity: Not on file  Transportation Needs: Not on file  Physical Activity: Not on file  Stress: Not on file  Social Connections: Not on file    Allergies:  Allergies  Allergen Reactions   Pramipexole Other (See Comments)    Shaking, palpitations, headache, faint feeling   Crestor [Rosuvastatin Calcium]     Muscle pain   Bee Pollen Other (See Comments)    Eyes and nose run   Pollen  Extract Other (See Comments)    Eyes and nose run    Metabolic Disorder Labs: Lab Results  Component Value Date   HGBA1C 5.4 04/24/2015   MPG 108 04/24/2015   No results found for: "PROLACTIN" Lab Results  Component Value Date   CHOL 178 02/17/2016   TRIG 229 (H) 02/17/2016   HDL 30 (L) 02/17/2016   CHOLHDL 5.9 (H) 02/17/2016   VLDL 46 (H) 02/17/2016   LDLCALC 102 (H) 02/17/2016   LDLCALC 126 (H) 11/18/2015   Lab Results  Component  Value Date   TSH 1.00 12/24/2021   TSH 2.96 08/29/2019    Therapeutic Level Labs: Lab Results  Component Value Date   LITHIUM 0.8 08/29/2019   LITHIUM 0.4 (L) 10/13/2018   Lab Results  Component Value Date   VALPROATE 62.2 08/20/2016   VALPROATE 83.0 10/14/2015   No results found for: "CBMZ"  Current Medications: Current Outpatient Medications  Medication Sig Dispense Refill   acetaminophen (TYLENOL) 500 MG tablet Take 1,000 mg by mouth 2 (two) times daily as needed for moderate pain or headache.     buPROPion (WELLBUTRIN XL) 150 MG 24 hr tablet Take 1 tablet (150 mg total) by mouth every morning. 30 tablet 2   cetirizine (ZYRTEC) 10 MG tablet Take 10 mg by mouth daily. Aller-Tec     Cholecalciferol (VITAMIN D3) 50 MCG (2000 UT) TABS Take 2,000 Units by mouth daily.      cyclobenzaprine (FLEXERIL) 10 MG tablet Take 10 mg by mouth at bedtime.     diazepam (VALIUM) 10 MG tablet TAKE ONE TABLET BY MOUTH EVERYDAY AT BEDTIME 60 tablet 2   diazepam (VALIUM) 2 MG tablet Take 1 tablet (2 mg total) by mouth daily as needed for anxiety. 30 tablet 2   estrogens, conjugated, (PREMARIN) 1.25 MG tablet Take 1.25 mg by mouth daily.      ezetimibe (ZETIA) 10 MG tablet Take 10 mg by mouth daily.     furosemide (LASIX) 20 MG tablet Take 1 tablet (20 mg total) by mouth daily. (Patient not taking: Reported on 06/24/2022) 30 tablet 2   HYDROcodone-acetaminophen (NORCO/VICODIN) 5-325 MG tablet Take 1 tablet by mouth every 8 (eight) hours as needed.      hyoscyamine (LEVSIN SL) 0.125 MG SL tablet Place 1 tablet (0.125 mg total) under the tongue every 6 (six) hours as needed for cramping. 30 tablet 1   Lactobacillus-Inulin (CULTURELLE DIGESTIVE HEALTH) CAPS Take 1 capsule by mouth daily.     loperamide (IMODIUM) 2 MG capsule Take 2 mg by mouth daily as needed for diarrhea or loose stools.     nitrofurantoin (MACRODANTIN) 50 MG capsule Take 1 capsule (50 mg total) by mouth at bedtime. 30 capsule 11   omeprazole (PRILOSEC) 40 MG capsule TAKE ONE CAPSULE BY MOUTH AT BREAKFAST AND AT BEDTIME 60 capsule 5   ondansetron (ZOFRAN) 4 MG tablet TAKE ONE TABLET BY MOUTH EVERY 8 HOURS AS NEEDED FOR NAUSEA AND VOMITING 20 tablet 0   propranolol ER (INDERAL LA) 80 MG 24 hr capsule Take 1 capsule (80 mg total) by mouth daily. 30 capsule 2   spironolactone (ALDACTONE) 50 MG tablet Take 1 tablet (50 mg total) by mouth daily. With 20 milligrams of Lasix each morning. 30 tablet 1   No current facility-administered medications for this visit.     Musculoskeletal: Strength & Muscle Tone: na Gait & Station: na Patient leans: N/A  Psychiatric Specialty Exam: Review of Systems  Gastrointestinal:  Positive for abdominal distention.  Neurological:  Positive for tremors.  All other systems reviewed and are negative.   There were no vitals taken for this visit.There is no height or weight on file to calculate BMI.  General Appearance: NA  Eye Contact:  NA  Speech:  Clear and Coherent  Volume:  Normal  Mood:  Euthymic  Affect:  NA  Thought Process:  Goal Directed  Orientation:  Full (Time, Place, and Person)  Thought Content: WDL   Suicidal Thoughts:  No  Homicidal Thoughts:  No  Memory:  Immediate;   Good Recent;   Good Remote;   Good  Judgement:  Good  Insight:  Good  Psychomotor Activity:  Tremor  Concentration:  Concentration: Good and Attention Span: Good  Recall:  Good  Fund of Knowledge: Good  Language: Good  Akathisia:  No  Handed:  Right   AIMS (if indicated): not done  Assets:  Communication Skills Desire for Improvement Resilience Social Support  ADL's:  Intact  Cognition: WNL  Sleep:  Good   Screenings: PHQ2-9    Flowsheet Row Video Visit from 09/30/2021 in [facility] at [place] Video Visit from 06/26/2021 in [facility] at [place] Video Visit from 03/26/2021 in [facility] at [place] Video Visit from 12/18/2020 in [facility] at [place] Video Visit from 09/24/2020 in [facility] at [facility] Total Score 0 0 0 0 0      Flowsheet Row Admission (Discharged) from 01/19/2022 in [facility] Video Visit from 09/30/2021 in [facility] at [place] Video Visit from 06/26/2021 in [facility] at [place]  C-SSRS RISK CATEGORY No Risk No Risk No Risk        Assessment and Plan: This patient is a 72 year old female with a history of bipolar disorder anxiety and tremor.  She is doing well on her current regimen.  She will continue Wellbutrin XL 150 mg every morning for depression, Valium 10 mg at bedtime for sleep and Valium 2 mg once daily as needed for anxiety.  She will continue Inderal LA 80 mg daily for tremor.  She will return to see me in 3 months  Collaboration of Care: Collaboration of Care: Primary Care Provider AEB notes will be shared with PCP at patient's request  Patient/Guardian was advised Release of Information must be obtained prior to any record release in order to collaborate their care with an outside provider. Patient/Guardian was advised if they have not already done so to contact the registration department to sign all necessary forms in order for [place] to release information regarding their care.   Consent: Patient/Guardian gives verbal consent for treatment and assignment of benefits  for services provided during this visit. Patient/Guardian expressed understanding and agreed to proceed.    Diannia Ruder, MD 07/09/2022, 3:12 PM

## 2022-07-13 DIAGNOSIS — R161 Splenomegaly, not elsewhere classified: Secondary | ICD-10-CM | POA: Diagnosis not present

## 2022-07-13 DIAGNOSIS — K7460 Unspecified cirrhosis of liver: Secondary | ICD-10-CM | POA: Diagnosis not present

## 2022-07-13 DIAGNOSIS — K760 Fatty (change of) liver, not elsewhere classified: Secondary | ICD-10-CM | POA: Diagnosis not present

## 2022-07-13 DIAGNOSIS — G894 Chronic pain syndrome: Secondary | ICD-10-CM | POA: Diagnosis not present

## 2022-07-13 DIAGNOSIS — K589 Irritable bowel syndrome without diarrhea: Secondary | ICD-10-CM | POA: Diagnosis not present

## 2022-07-13 DIAGNOSIS — Z79891 Long term (current) use of opiate analgesic: Secondary | ICD-10-CM | POA: Diagnosis not present

## 2022-07-13 DIAGNOSIS — R109 Unspecified abdominal pain: Secondary | ICD-10-CM | POA: Diagnosis not present

## 2022-07-13 LAB — ACID FAST CULTURE WITH REFLEXED SENSITIVITIES (MYCOBACTERIA): Acid Fast Culture: NEGATIVE

## 2022-07-13 NOTE — Progress Notes (Unsigned)
Andrea Dickson [birth year] [id number]  History of Present Illness: Andrea Dickson is a 72 y.o. female who presents today for follow up visit at [facility]. - GU history: 1. Recurrent UTI. Increased risk due to chronic diarrhea; also likely vaginal atrophy.  Urine culture results in past 12 months: - 03/18/2022: Positive for E. Coli - 04/02/2022: Negative  At last visit with Dr. Ronne Binning on 03/18/2022: Restarted Nitrofurantoin 50 mg nightly for UTI prophylaxis.  Since last visit: ***  Today: She reports ***  She {Actions; denies-reports:120008} increased urinary urgency, frequency, dysuria, gross hematuria, straining to void, or sensations of incomplete emptying.  She {Actions; denies-reports:120008} flank pain. She {Actions; denies-reports:120008} abdominal pain. She {Actions; denies-reports:120008} fevers. She {Actions; denies-reports:120008} nausea/ vomiting.  She {Actions; denies-reports:120008} vaginal pain, bleeding, discharge. She {Actions; denies-reports:120008} prior use of topical vaginal estrogen cream.   Fall Screening: Do you usually have a device to assist in your mobility? {yes/no:20286} ***cane / ***walker / ***wheelchair   Medications: Current Outpatient Medications  Medication Sig Dispense Refill   acetaminophen (TYLENOL) 500 MG tablet Take 1,000 mg by mouth 2 (two) times daily as needed for moderate pain or headache.     buPROPion (WELLBUTRIN XL) 150 MG 24 hr tablet Take 1 tablet (150 mg total) by mouth every morning. 30 tablet 2   cetirizine (ZYRTEC) 10 MG tablet Take 10 mg by mouth daily. Aller-Tec     Cholecalciferol (VITAMIN D3) 50 MCG (2000 UT) TABS Take 2,000 Units by mouth daily.      cyclobenzaprine (FLEXERIL) 10 MG tablet Take 10 mg by mouth at bedtime.     diazepam (VALIUM) 10 MG tablet TAKE ONE TABLET BY MOUTH EVERYDAY AT BEDTIME 60 tablet 2   diazepam (VALIUM) 2 MG tablet Take 1 tablet (2 mg total) by mouth daily as needed  for anxiety. 30 tablet 2   estrogens, conjugated, (PREMARIN) 1.25 MG tablet Take 1.25 mg by mouth daily.      ezetimibe (ZETIA) 10 MG tablet Take 10 mg by mouth daily.     furosemide (LASIX) 20 MG tablet Take 1 tablet (20 mg total) by mouth daily. (Patient not taking: Reported on 06/24/2022) 30 tablet 2   HYDROcodone-acetaminophen (NORCO/VICODIN) 5-325 MG tablet Take 1 tablet by mouth every 8 (eight) hours as needed.     hyoscyamine (LEVSIN SL) 0.125 MG SL tablet Place 1 tablet (0.125 mg total) under the tongue every 6 (six) hours as needed for cramping. 30 tablet 1   Lactobacillus-Inulin (CULTURELLE DIGESTIVE HEALTH) CAPS Take 1 capsule by mouth daily.     loperamide (IMODIUM) 2 MG capsule Take 2 mg by mouth daily as needed for diarrhea or loose stools.     nitrofurantoin (MACRODANTIN) 50 MG capsule Take 1 capsule (50 mg total) by mouth at bedtime. 30 capsule 11   omeprazole (PRILOSEC) 40 MG capsule TAKE ONE CAPSULE BY MOUTH AT BREAKFAST AND AT BEDTIME 60 capsule 5   ondansetron (ZOFRAN) 4 MG tablet TAKE ONE TABLET BY MOUTH EVERY 8 HOURS AS NEEDED FOR NAUSEA AND VOMITING 20 tablet 0   propranolol ER (INDERAL LA) 80 MG 24 hr capsule Take 1 capsule (80 mg total) by mouth daily. 30 capsule 2   spironolactone (ALDACTONE) 50 MG tablet Take 1 tablet (50 mg total) by mouth daily. With 20 milligrams of Lasix each morning. 30 tablet 1   No current facility-administered medications for this visit.    Allergies: Allergies  Allergen Reactions   Pramipexole Other (See Comments)  Shaking, palpitations, headache, faint feeling   Crestor [Rosuvastatin Calcium]     Muscle pain   Bee Pollen Other (See Comments)    Eyes and nose run   Pollen Extract Other (See Comments)    Eyes and nose run    Past Medical History:  Diagnosis Date   Anxiety    Asthma    Bipolar 1 disorder (HCC)    Chronic diarrhea    Cirrhosis (HCC)    Diagnosed September 2020, likely secondary to [facility], s/p Hep A/B vaccination in  2021.    Depression    Diabetes (HCC)    Essential tremor    GERD (gastroesophageal reflux disease)    Headache    High cholesterol    Past Surgical History:  Procedure Laterality Date   ABDOMINAL HYSTERECTOMY  1997   BIOPSY  01/17/2018   Procedure: BIOPSY;  Surgeon: Corbin Ade, MD;  [facility];  Service: Endoscopy;;  colon   carpal tunnel Bilateral 1999, 06/2009   COLONOSCOPY  2013   Dr. Teena Dunk: no colon polyps. quality of prep was fair. repeat colonoscopy in five years.    COLONOSCOPY WITH PROPOFOL N/A 01/17/2018   Dr. Jena Gauss: Colonic lipoma, 2 tubular adenomas removed.  Random colon biopsies negative.  Next colonoscopy 5 years.   ESOPHAGOGASTRODUODENOSCOPY (EGD) WITH PROPOFOL N/A 04/06/2019   Procedure: ESOPHAGOGASTRODUODENOSCOPY (EGD) WITH PROPOFOL;  Surgeon: Corbin Ade, MD;   Normal esophagus, mild portal hypertensive gastropathy, otherwise normal exam.  Recommend repeat EGD in 2 years for variceal screening.    ESOPHAGOGASTRODUODENOSCOPY (EGD) WITH PROPOFOL N/A 01/19/2022   Procedure: ESOPHAGOGASTRODUODENOSCOPY (EGD) WITH PROPOFOL;  Surgeon: Corbin Ade, MD;  [facility];  Service: Endoscopy;  Laterality: N/A;  12:45 pm, pt knows to arrive at 9:15   POLYPECTOMY  01/17/2018   Procedure: POLYPECTOMY;  Surgeon: Corbin Ade, MD;  [facility];  Service: Endoscopy;;  colon   SKIN CANCER EXCISION  2007   TONSILLECTOMY  1965   ulner nerve  Left 06/2009   decompression   Family History  Problem Relation Age of Onset   Diabetes Mother    Brain cancer Mother    Diabetes Father    Heart attack Father    Heart disease Father    Diabetes Sister    Bipolar disorder Brother    Diabetes Brother    Heart disease Brother    Colon cancer Neg Hx    Breast cancer Neg Hx    Social History   Socioeconomic History   Marital status: Divorced    Spouse name: Not on file   Number of children: 1   Years of education: 12   Highest education level:  Not on file  Occupational History   Occupation: retired    Comment: employed at [employer]  Tobacco Use   Smoking status: Every Day    Packs/day: 0.50    Years: 43.00    Additional pack years: 0.00    Total pack years: 21.50    Types: Cigarettes   Smokeless tobacco: Never   Tobacco comments:    smoking since 72yrs old.    Vaping Use   Vaping Use: Never used  Substance and Sexual Activity   Alcohol use: No    Alcohol/week: 0.0 standard drinks of alcohol   Drug use: No   Sexual activity: Not Currently  Other Topics Concern   Not on file  Social History Narrative   Lives alone.  Retired.  Education  12th grade.  One child.     Caffeine use- sodas, 2 daily   Social Determinants of Health   Financial Resource Strain: Not on file  Food Insecurity: Not on file  Transportation Needs: Not on file  Physical Activity: Not on file  Stress: Not on file  Social Connections: Not on file  Intimate Partner Violence: Not on file    Review of Systems Constitutional: Patient ***denies any unintentional weight loss or change in strength lntegumentary: Patient ***denies any rashes or pruritus Eyes: Patient denies ***dry eyes ENT: Patient ***denies dry mouth Cardiovascular: Patient ***denies chest pain or syncope Respiratory: Patient ***denies shortness of breath Gastrointestinal: Patient ***denies nausea, vomiting, constipation, or diarrhea Musculoskeletal: Patient ***denies muscle cramps or weakness Neurologic: Patient ***denies convulsions or seizures Psychiatric: Patient ***denies memory problems Allergic/Immunologic: Patient ***denies recent allergic reaction(s) Hematologic/Lymphatic: Patient denies bleeding tendencies Endocrine: Patient ***denies heat/cold intolerance  GU: As per HPI.  OBJECTIVE There were no vitals filed for this visit. There is no height or weight on file to calculate BMI.  Physical Examination  Constitutional: ***No obvious distress; patient is  ***non-toxic appearing  Cardiovascular: ***No visible lower extremity edema.  Respiratory: The patient does ***not have audible wheezing/stridor; respirations do ***not appear labored  Gastrointestinal: Abdomen ***non-distended Musculoskeletal: ***Normal ROM of UEs  Skin: ***No obvious rashes/open sores  Neurologic: CN 2-12 grossly ***intact Psychiatric: Answered questions ***appropriately with ***normal affect  Hematologic/Lymphatic/Immunologic: ***No obvious bruises or sites of spontaneous bleeding  UA: {Desc; negative/positive:13464} *** WBC/hpf, *** RBC/hpf, bacteria (***) *** nitrites, *** leukocytes, *** blood PVR: *** ml  ASSESSMENT No diagnosis found. ***  Will plan for follow up in ***months / ***1 year or sooner if needed. Pt verbalized understanding and agreement. All questions were answered.  PLAN Advised the following: 1. *** 2. ***No follow-ups on file.  No orders of the defined types were placed in this encounter.   It has been explained that the patient is to follow regularly with their PCP in addition to all other providers involved in their care and to follow instructions provided by these respective offices. Patient advised to contact urology clinic if any urologic-pertaining questions, concerns, new symptoms or problems arise in the interim period.  There are no Patient Instructions on file for this visit.  Electronically signed by:  Donnita Falls, FNP   07/13/22    5:17 PM

## 2022-07-14 ENCOUNTER — Encounter: Payer: Self-pay | Admitting: Urology

## 2022-07-14 ENCOUNTER — Ambulatory Visit (INDEPENDENT_AMBULATORY_CARE_PROVIDER_SITE_OTHER): Payer: 59 | Admitting: Urology

## 2022-07-14 VITALS — BP 120/75 | HR 73 | Temp 97.6°F

## 2022-07-14 DIAGNOSIS — N390 Urinary tract infection, site not specified: Secondary | ICD-10-CM

## 2022-07-14 DIAGNOSIS — Z87440 Personal history of urinary (tract) infections: Secondary | ICD-10-CM | POA: Diagnosis not present

## 2022-07-14 DIAGNOSIS — K7460 Unspecified cirrhosis of liver: Secondary | ICD-10-CM | POA: Diagnosis not present

## 2022-07-14 DIAGNOSIS — Z09 Encounter for follow-up examination after completed treatment for conditions other than malignant neoplasm: Secondary | ICD-10-CM | POA: Diagnosis not present

## 2022-07-14 DIAGNOSIS — R188 Other ascites: Secondary | ICD-10-CM | POA: Diagnosis not present

## 2022-07-14 LAB — BLADDER SCAN AMB NON-IMAGING: Scan Result: 321

## 2022-07-14 MED ORDER — TRIMETHOPRIM 100 MG PO TABS
100.0000 mg | ORAL_TABLET | Freq: Every day | ORAL | 11 refills | Status: DC
Start: 2022-07-14 — End: 2022-09-26

## 2022-07-14 NOTE — Progress Notes (Signed)
Pt here today for bladder scan. Bladder was scanned and 321 was visualized.  Pt is prepped for and in and out catherization. Patient was cleaned and prepped in a sterle fashion with betadine. A 16 fr catheter foley was inserted. Urine return was note 75 ml.   Performed by Kennyth Lose, CMA

## 2022-07-14 NOTE — Patient Instructions (Signed)
Recommendations regarding UTI prevention / management:  When UTI symptoms occur: Call urology office to request order for urine culture. We recommend waiting for urine culture result prior to use of any antibiotics.  For bladder pain/ burning with urination: Over the counter Pyridium (phenazopyridine) as needed (commonly known under the "AZO" brand). No more than 3 days consecutively at a time due to risk for methemoglobinemia, liver function issues, and bone health damage with long term use of Pyridium.  Routine use for UTI prevention: - Low dose antibiotic daily for UTI prophylaxis. - Topical vaginal estrogen for vaginal atrophy. Adequate fluid intake (>1.5 liters/day) to flush out the urinary tract. - Go to the bathroom to urinate every 4-6 hours while awake to minimize urinary stasis / bacterial overgrowth in the bladder. - Proanthocyanidin (PAC) supplement 36 mg daily; must be soluble (insoluble form of PAC will be ineffective). Recommended brand: Ellura. This is an over-the-counter supplement (often must be found/ purchased online) supplement derived from cranberries with concentrated active component: Proanthocyanidin (PAC) 36 mg daily. Decreases bacterial adherence to bladder lining. Not recommended for patients with interstitial cystitis due to acidity. - D-mannose powder (2 grams daily). This is an over-the-counter supplement which decreases bacterial adherence to bladder lining (it is a sugar that inhibits bacterial adherence to urothelial cells by binding to the pili of enteric bacteria). Take as per manufacturer recommendation. Can be used as an alternative or in addition to the concentrated cranberry supplement. Not recommended for diabetic patients due to sugar content. - Vitamin C supplement to acidify urine to minimize bacterial growth. Not recommended for patients with interstitial cystitis due to acidity. - Probiotic to maintain healthy vaginal microbiome to suppress bacteria at  urethral opening. Brand recommendations: Darrold Junker (includes probiotic & D-mannose ), Feminine Balance (highest concentration of lactobacillus) or Hyperbiotic Pro 15.  Note for patients with diabetes: You may read about D-mannose powder for UTI prevention. That is an over the-counter supplement which decreases bacterial adherence to bladder lining. I would NOT advise that for you as a person with diabetes due to its sugar content.

## 2022-07-15 LAB — BASIC METABOLIC PANEL
BUN/Creatinine Ratio: 17 (ref 12–28)
BUN: 18 mg/dL (ref 8–27)
CO2: 22 mmol/L (ref 20–29)
Calcium: 8.8 mg/dL (ref 8.7–10.3)
Chloride: 104 mmol/L (ref 96–106)
Creatinine, Ser: 1.08 mg/dL — ABNORMAL HIGH (ref 0.57–1.00)
Glucose: 100 mg/dL — ABNORMAL HIGH (ref 70–99)
Potassium: 4 mmol/L (ref 3.5–5.2)
Sodium: 142 mmol/L (ref 134–144)
eGFR: 55 mL/min/{1.73_m2} — ABNORMAL LOW (ref >59)

## 2022-07-15 LAB — MICROSCOPIC EXAMINATION

## 2022-07-15 LAB — URINALYSIS, ROUTINE W REFLEX MICROSCOPIC
Bilirubin, UA: NEGATIVE
Glucose, UA: NEGATIVE
Ketones, UA: NEGATIVE
Nitrite, UA: NEGATIVE
Protein,UA: NEGATIVE
Specific Gravity, UA: 1.015 (ref 1.005–1.030)
Urobilinogen, Ur: 0.2 mg/dL (ref 0.2–1.0)
pH, UA: 5.5 (ref 5.0–7.5)

## 2022-07-17 LAB — URINE CULTURE

## 2022-07-20 ENCOUNTER — Other Ambulatory Visit: Payer: Self-pay | Admitting: Urology

## 2022-07-20 DIAGNOSIS — N3000 Acute cystitis without hematuria: Secondary | ICD-10-CM

## 2022-07-20 MED ORDER — AMOXICILLIN-POT CLAVULANATE 875-125 MG PO TABS
1.0000 | ORAL_TABLET | Freq: Two times a day (BID) | ORAL | 0 refills | Status: DC
Start: 2022-07-20 — End: 2022-08-05

## 2022-07-20 NOTE — Progress Notes (Signed)
Please let pt know urine culture result. I have sent prescription for Augmentin. Instruct patient to hold daily low dose antibiotic (Trimethoprim) while taking this acute course of antibiotics. Thanks.

## 2022-07-22 ENCOUNTER — Other Ambulatory Visit: Payer: Self-pay

## 2022-07-22 DIAGNOSIS — K7460 Unspecified cirrhosis of liver: Secondary | ICD-10-CM

## 2022-07-22 DIAGNOSIS — K7469 Other cirrhosis of liver: Secondary | ICD-10-CM

## 2022-07-23 DIAGNOSIS — K7460 Unspecified cirrhosis of liver: Secondary | ICD-10-CM | POA: Diagnosis not present

## 2022-07-23 DIAGNOSIS — K7469 Other cirrhosis of liver: Secondary | ICD-10-CM | POA: Diagnosis not present

## 2022-07-23 DIAGNOSIS — R188 Other ascites: Secondary | ICD-10-CM | POA: Diagnosis not present

## 2022-07-24 LAB — BASIC METABOLIC PANEL
BUN/Creatinine Ratio: 14 (ref 12–28)
BUN: 16 mg/dL (ref 8–27)
CO2: 21 mmol/L (ref 20–29)
Calcium: 9.5 mg/dL (ref 8.7–10.3)
Chloride: 103 mmol/L (ref 96–106)
Creatinine, Ser: 1.12 mg/dL — ABNORMAL HIGH (ref 0.57–1.00)
Glucose: 89 mg/dL (ref 70–99)
Potassium: 4.6 mmol/L (ref 3.5–5.2)
Sodium: 139 mmol/L (ref 134–144)
eGFR: 53 mL/min/{1.73_m2} — ABNORMAL LOW (ref >59)

## 2022-07-25 ENCOUNTER — Emergency Department (HOSPITAL_COMMUNITY): Payer: 59

## 2022-07-25 ENCOUNTER — Other Ambulatory Visit: Payer: Self-pay

## 2022-07-25 ENCOUNTER — Emergency Department (HOSPITAL_COMMUNITY)
Admission: EM | Admit: 2022-07-25 | Discharge: 2022-07-25 | Disposition: A | Discharge status: Home / Self Care | Payer: 59 | Attending: Emergency Medicine | Admitting: Emergency Medicine

## 2022-07-25 ENCOUNTER — Encounter (HOSPITAL_COMMUNITY): Payer: Self-pay

## 2022-07-25 DIAGNOSIS — R197 Diarrhea, unspecified: Secondary | ICD-10-CM | POA: Insufficient documentation

## 2022-07-25 DIAGNOSIS — R1032 Left lower quadrant pain: Secondary | ICD-10-CM | POA: Diagnosis not present

## 2022-07-25 DIAGNOSIS — F17200 Nicotine dependence, unspecified, uncomplicated: Secondary | ICD-10-CM | POA: Insufficient documentation

## 2022-07-25 DIAGNOSIS — K766 Portal hypertension: Secondary | ICD-10-CM | POA: Diagnosis not present

## 2022-07-25 DIAGNOSIS — K7689 Other specified diseases of liver: Secondary | ICD-10-CM | POA: Diagnosis not present

## 2022-07-25 DIAGNOSIS — K7469 Other cirrhosis of liver: Secondary | ICD-10-CM | POA: Diagnosis not present

## 2022-07-25 DIAGNOSIS — R161 Splenomegaly, not elsewhere classified: Secondary | ICD-10-CM | POA: Diagnosis not present

## 2022-07-25 LAB — CBC
HCT: 39.8 % (ref 36.0–46.0)
Hemoglobin: 13.1 g/dL (ref 12.0–15.0)
MCH: 28.2 pg (ref 26.0–34.0)
MCHC: 32.9 g/dL (ref 30.0–36.0)
MCV: 85.8 fL (ref 80.0–100.0)
Platelets: 63 10*3/uL — ABNORMAL LOW (ref 150–400)
RBC: 4.64 MIL/uL (ref 3.87–5.11)
RDW: 15.1 % (ref 11.5–15.5)
WBC: 4.2 10*3/uL (ref 4.0–10.5)
nRBC: 0 % (ref 0.0–0.2)

## 2022-07-25 LAB — COMPREHENSIVE METABOLIC PANEL
ALT: 21 U/L (ref 0–44)
AST: 30 U/L (ref 15–41)
Albumin: 3.9 g/dL (ref 3.5–5.0)
Alkaline Phosphatase: 132 U/L — ABNORMAL HIGH (ref 38–126)
Anion gap: 7 (ref 5–15)
BUN: 16 mg/dL (ref 8–23)
CO2: 25 mmol/L (ref 22–32)
Calcium: 9.2 mg/dL (ref 8.9–10.3)
Chloride: 103 mmol/L (ref 98–111)
Creatinine, Ser: 1.17 mg/dL — ABNORMAL HIGH (ref 0.44–1.00)
GFR, Estimated: 50 mL/min — ABNORMAL LOW (ref >60)
Glucose, Bld: 126 mg/dL — ABNORMAL HIGH (ref 70–99)
Potassium: 4.5 mmol/L (ref 3.5–5.1)
Sodium: 135 mmol/L (ref 135–145)
Total Bilirubin: 1 mg/dL (ref 0.3–1.2)
Total Protein: 7.6 g/dL (ref 6.5–8.1)

## 2022-07-25 LAB — URINALYSIS, ROUTINE W REFLEX MICROSCOPIC
Bilirubin Urine: NEGATIVE
Glucose, UA: NEGATIVE mg/dL
Hgb urine dipstick: NEGATIVE
Ketones, ur: NEGATIVE mg/dL
Leukocytes,Ua: NEGATIVE
Nitrite: NEGATIVE
Protein, ur: NEGATIVE mg/dL
Specific Gravity, Urine: 1.013 (ref 1.005–1.030)
pH: 6 (ref 5.0–8.0)

## 2022-07-25 LAB — LIPASE, BLOOD: Lipase: 40 U/L (ref 11–51)

## 2022-07-25 LAB — POC OCCULT BLOOD, ED: Fecal Occult Bld: NEGATIVE

## 2022-07-25 MED ORDER — IOHEXOL 300 MG/ML  SOLN
100.0000 mL | Freq: Once | INTRAMUSCULAR | Status: AC | PRN
  Administered 2022-07-25: 100 mL via INTRAVENOUS

## 2022-07-25 NOTE — ED Provider Notes (Signed)
[facility] AT [facility] Provider Note   CSN: [id number] Arrival date & time: 07/25/22  1423     History  Chief Complaint  Patient presents with   Diarrhea    Andrea Dickson is a 72 y.o. female.  Patient with 3-day complaint of diarrhea stools are 8-9 a day states color is black.  Associated with left-sided abdominal pain.  Patient has a history of nonalcoholic cirrhosis history of chronic diarrhea followed by gastroenterology Dr. Jena Gauss.  Patient denies any vomiting denies any red blood.  Patient's vital signs here heart rate 65 blood pressure on arrival was 122/65 temp 97.5.  Patient is not on any blood thinners.  Is an everyday smoker.       Home Medications Prior to Admission medications   Medication Sig Start Date End Date Taking? Authorizing Provider  acetaminophen (TYLENOL) 500 MG tablet Take 1,000 mg by mouth 2 (two) times daily as needed for moderate pain or headache.    [provider]  amoxicillin-clavulanate (AUGMENTIN) 875-125 MG tablet Take 1 tablet by mouth every 12 (twelve) hours. 07/20/22   Donnita Falls, FNP  buPROPion (WELLBUTRIN XL) 150 MG 24 hr tablet Take 1 tablet (150 mg total) by mouth every morning. 07/09/22   Myrlene Broker, MD  cetirizine (ZYRTEC) 10 MG tablet Take 10 mg by mouth daily. Aller-Tec    [provider]  Cholecalciferol (VITAMIN D3) 50 MCG (2000 UT) TABS Take 2,000 Units by mouth daily.     [provider]  cyclobenzaprine (FLEXERIL) 10 MG tablet Take 10 mg by mouth at bedtime.    [provider]  diazepam (VALIUM) 10 MG tablet TAKE ONE TABLET BY MOUTH EVERYDAY AT BEDTIME 07/09/22   Myrlene Broker, MD  diazepam (VALIUM) 2 MG tablet Take 1 tablet (2 mg total) by mouth daily as needed for anxiety. 07/09/22 07/09/23  Myrlene Broker, MD  estrogens, conjugated, (PREMARIN) 1.25 MG tablet Take 1.25 mg by mouth daily.     [provider]  ezetimibe (ZETIA) 10 MG tablet  Take 10 mg by mouth daily. 10/19/19   [provider]  furosemide (LASIX) 20 MG tablet Take 1 tablet (20 mg total) by mouth daily. 06/18/22   Gelene Mink, NP  HYDROcodone-acetaminophen (NORCO/VICODIN) 5-325 MG tablet Take 1 tablet by mouth every 8 (eight) hours as needed. 05/19/22   [provider]  hyoscyamine (LEVSIN SL) 0.125 MG SL tablet Place 1 tablet (0.125 mg total) under the tongue every 6 (six) hours as needed for cramping. 05/11/22   Aida Raider, NP  Lactobacillus-Inulin (CULTURELLE DIGESTIVE HEALTH) CAPS Take 1 capsule by mouth daily. 11/10/21   [provider]  loperamide (IMODIUM) 2 MG capsule Take 2 mg by mouth daily as needed for diarrhea or loose stools.    [provider]  omeprazole (PRILOSEC) 40 MG capsule TAKE ONE CAPSULE BY MOUTH AT [facility] AND AT BEDTIME 03/16/22   Rourk, Gerrit Friends, MD  ondansetron (ZOFRAN) 4 MG tablet TAKE ONE TABLET BY MOUTH EVERY 8 HOURS AS NEEDED FOR NAUSEA AND VOMITING 02/04/22   Aida Raider, NP  propranolol ER (INDERAL LA) 80 MG 24 hr capsule Take 1 capsule (80 mg total) by mouth daily. 07/09/22 07/09/23  Myrlene Broker, MD  spironolactone (ALDACTONE) 50 MG tablet Take 1 tablet (50 mg total) by mouth daily. With 20 milligrams of Lasix each morning. 06/18/22   Gelene Mink, NP  trimethoprim (TRIMPEX) 100 MG tablet  Take 1 tablet (100 mg total) by mouth daily. 07/14/22   Donnita Falls, FNP      Allergies    Pramipexole, Crestor [rosuvastatin calcium], Bee pollen, and Pollen extract    Review of Systems   Review of Systems  Constitutional:  Negative for chills and fever.  HENT:  Negative for ear pain and sore throat.   Eyes:  Negative for pain and visual disturbance.  Respiratory:  Negative for cough and shortness of breath.   Cardiovascular:  Negative for chest pain and palpitations.  Gastrointestinal:  Positive for abdominal pain and diarrhea. Negative for vomiting.  Genitourinary:  Negative for dysuria and  hematuria.  Musculoskeletal:  Negative for arthralgias and back pain.  Skin:  Negative for color change and rash.  Neurological:  Negative for seizures and syncope.  All other systems reviewed and are negative.   Physical Exam Updated Vital Signs BP 133/72   Pulse 61   Temp (!) 97.5 F (36.4 C)   Resp 18   Ht 1.676 m (5\' 6" )   Wt 63 kg   SpO2 97%   BMI 22.44 kg/m  Physical Exam Vitals and nursing note reviewed.  Constitutional:      General: She is not in acute distress.    Appearance: Normal appearance. She is well-developed. She is not ill-appearing.  HENT:     Head: Normocephalic and atraumatic.  Eyes:     Extraocular Movements: Extraocular movements intact.     Conjunctiva/sclera: Conjunctivae normal.     Pupils: Pupils are equal, round, and reactive to light.  Cardiovascular:     Rate and Rhythm: Normal rate and regular rhythm.     Heart sounds: No murmur heard. Pulmonary:     Effort: Pulmonary effort is normal. No respiratory distress.     Breath sounds: Normal breath sounds.  Abdominal:     General: There is no distension.     Palpations: Abdomen is soft.     Tenderness: There is no abdominal tenderness. There is no guarding.  Genitourinary:    Rectum: Guaiac result negative.  Musculoskeletal:        General: No swelling.     Cervical back: Neck supple.     Right lower leg: No edema.     Left lower leg: No edema.  Skin:    General: Skin is warm and dry.     Capillary Refill: Capillary refill takes less than 2 seconds.  Neurological:     General: No focal deficit present.     Mental Status: She is alert and oriented to person, place, and time.  Psychiatric:        Mood and Affect: Mood normal.     ED Results / Procedures / Treatments   Labs (all labs ordered are listed, but only abnormal results are displayed) Labs Reviewed  COMPREHENSIVE METABOLIC PANEL - Abnormal; Notable for the following components:      Result Value   Glucose, Bld 126 (*)     Creatinine, Ser 1.17 (*)    Alkaline Phosphatase 132 (*)    GFR, Estimated 50 (*)    All other components within normal limits  CBC - Abnormal; Notable for the following components:   Platelets 63 (*)    All other components within normal limits  LIPASE, BLOOD  URINALYSIS, ROUTINE W REFLEX MICROSCOPIC  POC OCCULT BLOOD, ED    EKG None  Radiology CT ABDOMEN PELVIS W CONTRAST  Result Date: 07/25/2022 CLINICAL DATA:  Left lower quadrant  abdominal pain and diarrhea. Black stool color EXAM: CT ABDOMEN AND PELVIS WITH CONTRAST TECHNIQUE: Multidetector CT imaging of the abdomen and pelvis was performed using the standard protocol following bolus administration of intravenous contrast. RADIATION DOSE REDUCTION: This exam was performed according to the departmental dose-optimization program which includes automated exposure control, adjustment of the mA and/or kV according to patient size and/or use of iterative reconstruction technique. CONTRAST:  OMNIPAQUE IOHEXOL 300 MG/ML  SOLN COMPARISON:  CT 02/02/2019 and report from CT abdomen and pelvis 03/05/2021 FINDINGS: Lower chest: No acute abnormality. Hepatobiliary: Nodular liver contour. No focal lesion. Unremarkable gallbladder and biliary tree. Pancreas: Unremarkable. Spleen: Splenomegaly measuring 42.9 cm in craniocaudal dimension. Adrenals/Urinary Tract: Normal adrenal glands. No urinary calculi or hydronephrosis. Unremarkable bladder. Stomach/Bowel: Normal caliber large and small bowel. No bowel wall thickening. Normal appendix. Stomach is within normal limits. Vascular/Lymphatic: Patent portal veins. Aortic atherosclerotic calcification. Recanalized periumbilical vein. No lymphadenopathy. Reproductive: Hysterectomy.  No adnexal mass. Other: Small-moderate low-density abdominopelvic ascites. No free intraperitoneal air. Musculoskeletal: No acute osseous abnormality. IMPRESSION: 1. No acute abnormality in the abdomen or pelvis. 2. Cirrhosis with  sequela of portal hypertension including splenomegaly and small-moderate abdominopelvic ascites. Aortic Atherosclerosis (ICD10-I70.0). Electronically Signed   By: Minerva Fester M.D.   On: 07/25/2022 18:18    Procedures Procedures    Medications Ordered in ED Medications  iohexol (OMNIPAQUE) 300 MG/ML solution 100 mL (100 mLs Intravenous Contrast Given 07/25/22 1716)    ED Course/ Medical Decision Making/ A&P                             Medical Decision Making Amount and/or Complexity of Data Reviewed Labs: ordered.   Rectal exam here stool light brown Hemoccult negative.  Patient has a history of irritable bowel or history of diarrhea.  Ongoing for 3 days associated with left-sided abdominal pain more left lower quadrant.  States that she has been having about 8-9 stools a day black in color not on iron not taking Pepto-Bismol.  Patient followed by gastroenterology Dr. Jena Gauss past medical history significant for headache depression anxiety bipolar high cholesterol diabetes history of cirrhosis diagnosed in 2020 nonalcoholic and chronic diarrhea.  CT scan does show evidence of portal hypertension and cirrhosis but no acute abnormalities.  Urinalysis negative.  CBC no leukocytosis hemoglobin 13.1 platelets are 63 her platelets usually are low.  So no significant change there.  Complete metabolic panel significant alk phos 132 total bili normal electrolytes normal renal function GFR 50.  Lipase not elevated stool Hemoccult negative.  Patient nontoxic no acute distress patient stable for discharge home.  Patient not tachycardic no fevers blood pressure 146/83 oxygen sats are 99% on room air.   Final Clinical Impression(s) / ED Diagnoses Final diagnoses:  Left lower quadrant abdominal pain  Diarrhea, unspecified type    Rx / DC Orders ED Discharge Orders     None         Vanetta Mulders, MD 07/25/22 2246

## 2022-07-25 NOTE — ED Provider Triage Note (Signed)
Emergency Medicine Provider Triage Evaluation Note  Andrea Dickson , a 72 y.o. female  was evaluated in triage.  Pt complains of diarrhea.  3 days ago.  Also with abdominal pain left lower quadrant.  Radiates to the back.  Diarrhea is "black in color".  Denies fever.  Has history of chronic diarrhea and followed by GI.  Review of Systems  Positive: See above Negative: See above  Physical Exam  BP 122/65 (BP Location: Right Arm)   Pulse 65   Temp (!) 97.5 F (36.4 C)   Resp 18   Ht 5\' 6"  (1.676 m)   Wt 63 kg   SpO2 100%   BMI 22.44 kg/m  Gen:   Awake, no distress   Resp:  Normal effort  MSK:   Moves extremities without difficulty  Other:  LLQ tenderness  Medical Decision Making  Medically screening exam initiated at 4:50 PM.  Appropriate orders placed.  Andrea Dickson was informed that the remainder of the evaluation will be completed by another provider, this initial triage assessment does not replace that evaluation, and the importance of remaining in the ED until their evaluation is complete.  Work up started   Gareth Eagle, PA-C 07/25/22 1652

## 2022-07-25 NOTE — Discharge Instructions (Signed)
Return for any new or worse symptoms.  Workup today to include CT scan of the abdomen without any acute findings.  And no signs of any blood in the stool.  Make an appointment to follow-up with your GI doctor.

## 2022-07-25 NOTE — ED Triage Notes (Signed)
Pt reports:  Diarrhea Ongoing 3 days Black in color About 8-9 stools a day  Abdomen pain Ongoing pain

## 2022-07-28 DIAGNOSIS — E1165 Type 2 diabetes mellitus with hyperglycemia: Secondary | ICD-10-CM | POA: Diagnosis not present

## 2022-07-28 DIAGNOSIS — I10 Essential (primary) hypertension: Secondary | ICD-10-CM | POA: Diagnosis not present

## 2022-07-28 DIAGNOSIS — E559 Vitamin D deficiency, unspecified: Secondary | ICD-10-CM | POA: Diagnosis not present

## 2022-07-28 DIAGNOSIS — E7849 Other hyperlipidemia: Secondary | ICD-10-CM | POA: Diagnosis not present

## 2022-07-28 DIAGNOSIS — N1831 Chronic kidney disease, stage 3a: Secondary | ICD-10-CM | POA: Diagnosis not present

## 2022-07-31 ENCOUNTER — Ambulatory Visit: Payer: 59 | Admitting: Urology

## 2022-08-04 DIAGNOSIS — D696 Thrombocytopenia, unspecified: Secondary | ICD-10-CM | POA: Diagnosis not present

## 2022-08-04 DIAGNOSIS — Z041 Encounter for examination and observation following transport accident: Secondary | ICD-10-CM | POA: Diagnosis not present

## 2022-08-04 DIAGNOSIS — M7732 Calcaneal spur, left foot: Secondary | ICD-10-CM | POA: Diagnosis not present

## 2022-08-04 DIAGNOSIS — M7752 Other enthesopathy of left foot: Secondary | ICD-10-CM | POA: Diagnosis not present

## 2022-08-04 DIAGNOSIS — R030 Elevated blood-pressure reading, without diagnosis of hypertension: Secondary | ICD-10-CM | POA: Diagnosis not present

## 2022-08-04 DIAGNOSIS — D692 Other nonthrombocytopenic purpura: Secondary | ICD-10-CM | POA: Diagnosis not present

## 2022-08-04 DIAGNOSIS — I700 Atherosclerosis of aorta: Secondary | ICD-10-CM | POA: Diagnosis not present

## 2022-08-04 DIAGNOSIS — M25672 Stiffness of left ankle, not elsewhere classified: Secondary | ICD-10-CM | POA: Diagnosis not present

## 2022-08-04 DIAGNOSIS — R109 Unspecified abdominal pain: Secondary | ICD-10-CM | POA: Diagnosis not present

## 2022-08-04 DIAGNOSIS — K766 Portal hypertension: Secondary | ICD-10-CM | POA: Diagnosis not present

## 2022-08-04 DIAGNOSIS — F17210 Nicotine dependence, cigarettes, uncomplicated: Secondary | ICD-10-CM | POA: Diagnosis not present

## 2022-08-04 DIAGNOSIS — E1169 Type 2 diabetes mellitus with other specified complication: Secondary | ICD-10-CM | POA: Diagnosis not present

## 2022-08-04 DIAGNOSIS — N1831 Chronic kidney disease, stage 3a: Secondary | ICD-10-CM | POA: Diagnosis not present

## 2022-08-04 DIAGNOSIS — K7460 Unspecified cirrhosis of liver: Secondary | ICD-10-CM | POA: Diagnosis not present

## 2022-08-05 ENCOUNTER — Ambulatory Visit (INDEPENDENT_AMBULATORY_CARE_PROVIDER_SITE_OTHER): Payer: 59 | Admitting: Gastroenterology

## 2022-08-05 ENCOUNTER — Encounter: Payer: Self-pay | Admitting: Gastroenterology

## 2022-08-05 VITALS — BP 113/65 | HR 64 | Temp 97.3°F | Ht 66.0 in | Wt 136.1 lb

## 2022-08-05 DIAGNOSIS — K7469 Other cirrhosis of liver: Secondary | ICD-10-CM

## 2022-08-05 NOTE — Progress Notes (Signed)
Gastroenterology Office Note     Primary Care Physician:  Juliette Alcide, MD  Primary Gastroenterologist: Dr. Jena Gauss    Chief Complaint   Chief Complaint  Patient presents with   Follow-up    Patient here today due to her cirrhosis. She denies any issues with the liver today,but she says she is having some left sided flank and abdominal pain,constipation. She is taking the colace daily. Patient does says she is taking imodium once per day.     History of Present Illness   Andrea Dickson is a 72 y.o. female presenting today in follow-up with a history of MASH cirrhosis, chronic diarrhea, chronic nausea. She was seen with new onset ascites in May 2024, undergoing first-ever para on 5/15 with 4.8 liters removed. She has required a second para on 6/5 with 4.6 liters removed.    Regarding cirrhosis care, she is uptodate with last EGD Jan 2024 as noted below. Colonoscopy in 2020 unrevealing. We have updated labs including AFP tumor marker, INR, HFP. She has had a mild bump in alk phos and Tbili (Alk Phos 130, Tbili 1.4), previously normal. AFP tumor marker normal. Doppler ultrasound without PVT but unable to visualize splenic vein. US abdomen this month without obvious liver lesions.  History of chronic diarrhea with evaluation including: trial of Xifaxan in Jan 2024 for IBS-D. Negative colonic biopsies in past, negative celiac screen, trial off metformin without improvement, fecal elastase normal. Will have intermittent soft and loose stools. Dicyclomine without improvement in the past. Trial of Zenpep samples noted improvement.    Lasix 20 mg daily and spironlactone 50 mg daily. She has had slight creeping of creatinine with most recent 1.17 (previously below 1 was her normal). Sodium stable in 135-140 range.   Diarrhea has slacked off and now more constipated. Taking imodium daily.   Had left-sided pain but is chronic. Went to ED. Stool was black when went to ED. HEME NEGATIVE in  ED. Then stool was brown. Hgb normal. No iron or pepto.    EGD 01/19/2022: -3 columns of grade 2/limited grade 3 varices -Portal hypertensive gastropathy -Friable gastric mucosa -Continue Inderal daily -No repeat EGD needed as long as no evidence of upper GI bleed     Colonoscopy Jan 2020: colonic lipoma, tubular adenomas, random colonic biopsies negative.     Past Medical History:  Diagnosis Date   Anxiety    Asthma    Bipolar 1 disorder (HCC)    Chronic diarrhea    Cirrhosis (HCC)    Diagnosed September 2020, likely secondary to [facility], s/p Hep A/B vaccination in 2021.    Depression    Diabetes (HCC)    Essential tremor    GERD (gastroesophageal reflux disease)    Headache    High cholesterol     Past Surgical History:  Procedure Laterality Date   ABDOMINAL HYSTERECTOMY  1997   BIOPSY  01/17/2018   Procedure: BIOPSY;  Surgeon: Corbin Ade, MD;  Location: [facility];  Service: Endoscopy;;  colon   carpal tunnel Bilateral 1999, 06/2009   COLONOSCOPY  2013   Dr. Teena Dunk: no colon polyps. quality of prep was fair. repeat colonoscopy in five years.    COLONOSCOPY WITH PROPOFOL N/A 01/17/2018   Dr. Jena Gauss: Colonic lipoma, 2 tubular adenomas removed.  Random colon biopsies negative.  Next colonoscopy 5 years.   ESOPHAGOGASTRODUODENOSCOPY (EGD) WITH PROPOFOL N/A 04/06/2019   Procedure: ESOPHAGOGASTRODUODENOSCOPY (EGD) WITH PROPOFOL;  Surgeon: Corbin Ade, MD;  Normal esophagus, mild portal hypertensive gastropathy, otherwise normal exam.  Recommend repeat EGD in 2 years for variceal screening.    ESOPHAGOGASTRODUODENOSCOPY (EGD) WITH PROPOFOL N/A 01/19/2022   Procedure: ESOPHAGOGASTRODUODENOSCOPY (EGD) WITH PROPOFOL;  Surgeon: Corbin Ade, MD;  Location: [facility];  Service: Endoscopy;  Laterality: N/A;  12:45 pm, pt knows to arrive at 9:15   POLYPECTOMY  01/17/2018   Procedure: POLYPECTOMY;  Surgeon: Corbin Ade, MD;  Location: [facility];  Service: Endoscopy;;   colon   SKIN CANCER EXCISION  2007   TONSILLECTOMY  1965   ulner nerve  Left 06/2009   decompression    Current Outpatient Medications  Medication Sig Dispense Refill   acetaminophen (TYLENOL) 500 MG tablet Take 1,000 mg by mouth 2 (two) times daily as needed for moderate pain or headache.     buPROPion (WELLBUTRIN XL) 150 MG 24 hr tablet Take 1 tablet (150 mg total) by mouth every morning. 30 tablet 2   cetirizine (ZYRTEC) 10 MG tablet Take 10 mg by mouth daily. Aller-Tec     Cholecalciferol (VITAMIN D3) 50 MCG (2000 UT) TABS Take 2,000 Units by mouth daily.      diazepam (VALIUM) 10 MG tablet TAKE ONE TABLET BY MOUTH EVERYDAY AT BEDTIME (Patient taking differently: Take 10 mg by mouth at bedtime. TAKE ONE TABLET BY MOUTH EVERYDAY AT BEDTIME) 60 tablet 2   diazepam (VALIUM) 2 MG tablet Take 1 tablet (2 mg total) by mouth daily as needed for anxiety. 30 tablet 2   estrogens, conjugated, (PREMARIN) 1.25 MG tablet Take 1.25 mg by mouth daily.      ezetimibe (ZETIA) 10 MG tablet Take 10 mg by mouth daily.     furosemide (LASIX) 20 MG tablet Take 1 tablet (20 mg total) by mouth daily. 30 tablet 2   HYDROcodone-acetaminophen (NORCO/VICODIN) 5-325 MG tablet Take 1 tablet by mouth every 8 (eight) hours as needed.     hyoscyamine (LEVSIN SL) 0.125 MG SL tablet Place 1 tablet (0.125 mg total) under the tongue every 6 (six) hours as needed for cramping. 30 tablet 1   Lactobacillus-Inulin (CULTURELLE DIGESTIVE HEALTH) CAPS Take 1 capsule by mouth daily.     loperamide (IMODIUM) 2 MG capsule Take 2 mg by mouth daily as needed for diarrhea or loose stools.     omeprazole (PRILOSEC) 40 MG capsule TAKE ONE CAPSULE BY MOUTH AT BREAKFAST AND AT BEDTIME 60 capsule 5   ondansetron (ZOFRAN) 4 MG tablet TAKE ONE TABLET BY MOUTH EVERY 8 HOURS AS NEEDED FOR NAUSEA AND VOMITING 20 tablet 0   propranolol ER (INDERAL LA) 80 MG 24 hr capsule Take 1 capsule (80 mg total) by mouth daily. 30 capsule 2   spironolactone  (ALDACTONE) 50 MG tablet Take 1 tablet (50 mg total) by mouth daily. With 20 milligrams of Lasix each morning. 30 tablet 1   trimethoprim (TRIMPEX) 100 MG tablet Take 1 tablet (100 mg total) by mouth daily. 30 tablet 11   No current facility-administered medications for this visit.    Allergies as of 08/05/2022 - Review Complete 08/05/2022  Allergen Reaction Noted   Pramipexole Other (See Comments) 04/24/2015   Crestor [rosuvastatin calcium]  03/28/2019   Bee pollen Other (See Comments) 04/24/2015   Pollen extract Other (See Comments) 04/24/2015    Family History  Problem Relation Age of Onset   Diabetes Mother    Brain cancer Mother    Diabetes Father    Heart attack Father  Heart disease Father    Diabetes Sister    Bipolar disorder Brother    Diabetes Brother    Heart disease Brother    Colon cancer Neg Hx    Breast cancer Neg Hx     Social History   Socioeconomic History   Marital status: Divorced    Spouse name: Not on file   Number of children: 1   Years of education: 12   Highest education level: Not on file  Occupational History   Occupation: retired    Comment: employed at [employer]  Tobacco Use   Smoking status: Every Day    Current packs/day: 0.50    Average packs/day: 0.5 packs/day for 43.0 years (21.5 ttl pk-yrs)    Types: Cigarettes   Smokeless tobacco: Never   Tobacco comments:    smoking since 72yrs old.    Vaping Use   Vaping status: Never Used  Substance and Sexual Activity   Alcohol use: No    Alcohol/week: 0.0 standard drinks of alcohol   Drug use: No   Sexual activity: Not Currently  Other Topics Concern   Not on file  Social History Narrative   Lives alone.  Retired.  Education 12th grade.  One child.     Caffeine use- sodas, 2 daily   Social Determinants of Health   Financial Resource Strain: Low Risk  (11/10/2021)   Received from [facility], [facility]   Overall Financial Resource Strain (CARDIA)    Difficulty  of Paying Living Expenses: Not hard at all  Food Insecurity: No Food Insecurity (11/10/2021)   Received from [facility], [facility]   Hunger Vital Sign    Worried About Running Out of Food in the Last Year: Never true    Ran Out of Food in the Last Year: Never true  Transportation Needs: No Transportation Needs (11/10/2021)   Received from [facility], [facility]   [facility] - Transportation    Lack of Transportation (Medical): No    Lack of Transportation (Non-Medical): No  Physical Activity: Inactive (11/10/2021)   Received from [facility], [facility]   Exercise Vital Sign    Days of Exercise per Week: 0 days    Minutes of Exercise per Session: 0 min  Stress: No Stress Concern Present (11/10/2021)   Received from [facility], [facility] - Occupational Stress Questionnaire    Feeling of Stress : Not at all  Social Connections: Socially Isolated (11/10/2021)   Received from [facility], [facility]   Social Connection and Isolation Panel [NHANES]    Frequency of Communication with Friends and Family: More than three times a week    Frequency of Social Gatherings with Friends and Family: Once a week    Attends Religious Services: Never    Database administrator or Organizations: No    Attends Banker Meetings: Never    Marital Status: Divorced  Catering manager Violence: Not At Risk (11/10/2021)   Received from [facility], [facility]   Humiliation, Afraid, Rape, and Kick questionnaire    Fear of Current or Ex-Partner: No    Emotionally Abused: No    Physically Abused: No    Sexually Abused: No     Review of Systems   Gen: Denies any fever, chills, fatigue, weight loss, lack of appetite.  CV: Denies chest pain, heart palpitations, peripheral  edema, syncope.  Resp: Denies shortness of breath at rest or with exertion. Denies wheezing or cough.  GI: Denies dysphagia or  odynophagia. Denies jaundice, hematemesis, fecal incontinence. GU : Denies urinary burning, urinary frequency, urinary hesitancy MS: Denies joint pain, muscle weakness, cramps, or limitation of movement.  Derm: Denies rash, itching, dry skin Psych: Denies depression, anxiety, memory loss, and confusion Heme: Denies bruising, bleeding, and enlarged lymph nodes.   Physical Exam   BP 113/65 (BP Location: Left Arm, Patient Position: Sitting, Cuff Size: Normal)   Pulse 64   Temp (!) 97.3 F (36.3 C) (Temporal)   Ht 5\' 6"  (1.676 m)   Wt 136 lb 1.6 oz (61.7 kg)   BMI 21.97 kg/m  General:   Alert and oriented. Pleasant and cooperative. Well-nourished and well-developed.  Head:  Normocephalic and atraumatic. Eyes:  Without icterus Abdomen:  +BS, soft, non-tender and non-distended.  Rectal:  Deferred  Msk:  Symmetrical without gross deformities. Normal posture. Extremities:  Without edema. Neurologic:  Alert and  oriented x4;  grossly normal neurologically. Skin:  Intact without significant lesions or rashes. Psych:  Alert and cooperative. Normal mood and affect.   Assessment   AAISHAH INZER is a 72 y.o. female presenting today in close follow-up with a history of MASH cirrhosis, chronic diarrhea, chronic nausea. She was seen with new onset ascites in May 2024, undergoing first-ever para on 5/15 with 4.8 liters removed. She has required a second para on 6/5 with 4.6 liters removed. (MELD 3.0 was 10 in May 2024 during decompensation).   Cirrhosis: varices on EGD Jan 2024 and on inderal. AFP tumor marker normal. Due to new onset ascites, doppler ultrasound without PVT but splenic vein unable to be visualized. I have discussed with radiologist, who recommends CTA with BRTO to evaluate vasculature thoroughly and can also help aid in any underlying lesion not appreciated on [place]. Will decrease frequency of low dose lasix and spironolactone to every other day due to slowly rising creatinine.  Repeat BMP today.   Diarrhea: chronic. Stop imodium, as now she is trending to constipation. May take Miralax daily if needed for constipation.        PLAN    Lasix 20 mg and spironolactone 50 mg every M, W, Friday Update BMP today to ensure stable Upcoming CTA/BRTO AFP in Nov 2024 Low sodium diet Stop imodium. Miralax prn Follow-up in 6 weeks. Will update MELD labs then Continue propanolol for variceal bleeding prophylaxis. Recent EGD Jan 2024. No further EGD unless bleeding or further decompensation.    Gelene Mink, PhD, ANP-BC [facility]

## 2022-08-05 NOTE — Patient Instructions (Addendum)
Let's stop Imodium.   If you continue with constipation, take 1 capful of Miralax each morning as needed.    Let's change lasix and spironolactone to only Monday, Wednesday, and Friday.   Let's repeat the kidney labs today at [facility]. I just want to make sure all is stable.  We will see you in 6 weeks!  Call with any concerns whatsoever!  I enjoyed seeing you again today! I value our relationship and want to provide genuine, compassionate, and quality care. You may receive a survey regarding your visit with me, and I welcome your feedback! Thanks so much for taking the time to complete this. I look forward to seeing you again.      Gelene Mink, PhD, ANP-BC [facility] Gastroenterology

## 2022-08-06 LAB — BASIC METABOLIC PANEL
BUN/Creatinine Ratio: 14 (ref 12–28)
BUN: 17 mg/dL (ref 8–27)
CO2: 21 mmol/L (ref 20–29)
Calcium: 9.3 mg/dL (ref 8.7–10.3)
Chloride: 102 mmol/L (ref 96–106)
Creatinine, Ser: 1.19 mg/dL — ABNORMAL HIGH (ref 0.57–1.00)
Glucose: 109 mg/dL — ABNORMAL HIGH (ref 70–99)
Potassium: 4.1 mmol/L (ref 3.5–5.2)
Sodium: 140 mmol/L (ref 134–144)
eGFR: 49 mL/min/{1.73_m2} — ABNORMAL LOW (ref >59)

## 2022-08-10 DIAGNOSIS — G894 Chronic pain syndrome: Secondary | ICD-10-CM | POA: Diagnosis not present

## 2022-08-10 DIAGNOSIS — K760 Fatty (change of) liver, not elsewhere classified: Secondary | ICD-10-CM | POA: Diagnosis not present

## 2022-08-10 DIAGNOSIS — R109 Unspecified abdominal pain: Secondary | ICD-10-CM | POA: Diagnosis not present

## 2022-08-10 DIAGNOSIS — Z79891 Long term (current) use of opiate analgesic: Secondary | ICD-10-CM | POA: Diagnosis not present

## 2022-08-10 DIAGNOSIS — K589 Irritable bowel syndrome without diarrhea: Secondary | ICD-10-CM | POA: Diagnosis not present

## 2022-08-10 DIAGNOSIS — K7460 Unspecified cirrhosis of liver: Secondary | ICD-10-CM | POA: Diagnosis not present

## 2022-08-10 DIAGNOSIS — Z79899 Other long term (current) drug therapy: Secondary | ICD-10-CM | POA: Diagnosis not present

## 2022-08-10 DIAGNOSIS — R161 Splenomegaly, not elsewhere classified: Secondary | ICD-10-CM | POA: Diagnosis not present

## 2022-08-17 ENCOUNTER — Ambulatory Visit (HOSPITAL_COMMUNITY)
Admission: RE | Admit: 2022-08-17 | Discharge: 2022-08-17 | Disposition: A | Discharge status: Home / Self Care | Payer: 59 | Source: Ambulatory Visit | Attending: Gastroenterology | Admitting: Gastroenterology

## 2022-08-17 ENCOUNTER — Other Ambulatory Visit: Payer: Self-pay | Admitting: Gastroenterology

## 2022-08-17 DIAGNOSIS — K7460 Unspecified cirrhosis of liver: Secondary | ICD-10-CM | POA: Diagnosis not present

## 2022-08-17 DIAGNOSIS — K766 Portal hypertension: Secondary | ICD-10-CM | POA: Diagnosis not present

## 2022-08-17 DIAGNOSIS — R161 Splenomegaly, not elsewhere classified: Secondary | ICD-10-CM | POA: Diagnosis not present

## 2022-08-17 DIAGNOSIS — R188 Other ascites: Secondary | ICD-10-CM | POA: Diagnosis not present

## 2022-08-17 DIAGNOSIS — K7469 Other cirrhosis of liver: Secondary | ICD-10-CM | POA: Diagnosis not present

## 2022-08-17 MED ORDER — IOHEXOL 350 MG/ML SOLN
100.0000 mL | Freq: Once | INTRAVENOUS | Status: AC | PRN
  Administered 2022-08-17: 100 mL via INTRAVENOUS

## 2022-09-01 ENCOUNTER — Telehealth: Payer: Self-pay

## 2022-09-01 NOTE — Telephone Encounter (Signed)
Pt phoned complaining of having diarrhea x 3 days. When she eats as well as drink she has diarrhea. BM's are all day. Please advise

## 2022-09-02 NOTE — Telephone Encounter (Signed)
If she isn't taking Imodium, have her take it each morning and can take as needed. If no improvement, we can resume pancreatic enzymes, as this helped in the past. She has a history of chronic diarrhea.

## 2022-09-03 NOTE — Telephone Encounter (Signed)
Andrea Dickson,    Phoned and advised the pt of your instructions with medications. She is going to do the Imodium for a few days and let us know how it does. She hasn't been taking it. Will keep [place] posted

## 2022-09-08 ENCOUNTER — Other Ambulatory Visit: Payer: Self-pay | Admitting: Urology

## 2022-09-08 ENCOUNTER — Other Ambulatory Visit: Payer: Self-pay | Admitting: Internal Medicine

## 2022-09-08 ENCOUNTER — Other Ambulatory Visit: Payer: Self-pay | Admitting: Gastroenterology

## 2022-09-10 NOTE — Telephone Encounter (Signed)
Taken care of

## 2022-09-10 NOTE — Telephone Encounter (Signed)
Pt.notified

## 2022-09-11 ENCOUNTER — Other Ambulatory Visit: Payer: Self-pay | Admitting: Gastroenterology

## 2022-09-16 ENCOUNTER — Telehealth: Payer: Self-pay

## 2022-09-16 MED ORDER — SPIRONOLACTONE 50 MG PO TABS: ORAL_TABLET | ORAL | 1 refills | Status: DC

## 2022-09-16 NOTE — Addendum Note (Signed)
Addended by: Gelene Mink on: 09/16/2022 12:52 PM   Modules accepted: Orders

## 2022-09-16 NOTE — Telephone Encounter (Signed)
Noted  [facility] notified of the Rx sent in today

## 2022-09-16 NOTE — Telephone Encounter (Signed)
I sent in the spironolactone 50 mg to take every mon, wed, fri. She will take lasix every mon, wed, Friday as well (this was already sent in).

## 2022-09-16 NOTE — Telephone Encounter (Signed)
Andrea Dickson [facility] called to see if one of those Rx's was suppose to be only Mon. Wed and Friday according to the pt.the pt could not tell me, so if you remember please send in Rx that states only particular days

## 2022-09-17 ENCOUNTER — Encounter: Payer: Self-pay | Admitting: Gastroenterology

## 2022-09-17 ENCOUNTER — Ambulatory Visit (INDEPENDENT_AMBULATORY_CARE_PROVIDER_SITE_OTHER): Payer: 59 | Admitting: Gastroenterology

## 2022-09-17 ENCOUNTER — Telehealth: Payer: Self-pay

## 2022-09-17 VITALS — BP 116/70 | HR 57 | Temp 97.6°F | Ht 66.0 in | Wt 138.4 lb

## 2022-09-17 DIAGNOSIS — K7469 Other cirrhosis of liver: Secondary | ICD-10-CM | POA: Diagnosis not present

## 2022-09-17 MED ORDER — HYOSCYAMINE SULFATE 0.125 MG SL SUBL: 0.1250 mg | SUBLINGUAL_TABLET | Freq: Four times a day (QID) | SUBLINGUAL | 1 refills | Status: AC | PRN

## 2022-09-17 MED ORDER — FUROSEMIDE 20 MG PO TABS: 20.0000 mg | ORAL_TABLET | Freq: Every day | ORAL | 3 refills | Status: DC

## 2022-09-17 NOTE — Telephone Encounter (Signed)
Pt was seen today and she phoned back to ask for a refill on her hyoscyamine 0.125mg  SL tab

## 2022-09-17 NOTE — Addendum Note (Signed)
Addended by: Gelene Mink on: 09/17/2022 03:54 PM   Modules accepted: Orders

## 2022-09-17 NOTE — Patient Instructions (Signed)
Please have blood work done so we can make sure kidney function is stable.  Continue diuretics (furosemide 20 mg and spironolactone 50 mg) once each morning.  Let's try Zenpep 1 capsule WITH your meal (while you are eating, not before or after), 3 times a day. Let me know how this works!  We will see you in 3 months!  I enjoyed seeing you again today! I value our relationship and want to provide genuine, compassionate, and quality care. You may receive a survey regarding your visit with me, and I welcome your feedback! Thanks so much for taking the time to complete this. I look forward to seeing you again.      Gelene Mink, PhD, ANP-BC [facility] Gastroenterology

## 2022-09-17 NOTE — Progress Notes (Signed)
Gastroenterology Office Note     Primary Care Physician:  Juliette Alcide, MD  Primary Gastroenterologist: Dr. Jena Gauss    Chief Complaint   Chief Complaint  Patient presents with   Follow-up    Doing well, states that fluid has been building up but no fluid issues today.     History of Present Illness   Andrea Dickson is a 72 y.o. female presenting today in follow-up with a history of  MASH cirrhosis, chronic diarrhea, chronic nausea. She was seen with new onset ascites in May 2024, undergoing first-ever para on 5/15 with 4.8 liters removed. She has required a second para on 6/5 with 4.6 liters removed.    she is uptodate with last EGD Jan 2024 as noted below. Colonoscopy in 2020 unrevealing. We have updated labs including AFP tumor marker. AFP tumor marker normal. Doppler ultrasound without PVT but unable to visualize splenic vein. US abdomen this month without obvious liver lesions. CTA/BRTO without mass and no PVT.   Due to bump in creatinine with diuretic adjustment, we have been doing every other day. However, she had to go back to every day due to feeling more increase in fluid in lower extremities.    History of chronic diarrhea with evaluation including: trial of Xifaxan in Jan 2024 for IBS-D. Negative colonic biopsies in past, negative celiac screen, trial off metformin without improvement, fecal elastase normal. Will have intermittent soft and loose stools. Dicyclomine without improvement in the past. Trial of Zenpep samples noted improvement. Will eat something and be fine. Then next day eat and will have diarrhea. Cheese, chocolate, dairy sets her off.   No abdominal distension, overt GI bleeding, confusion, mental status changes, jaundice, pruritus.   Last imaging: CTA/BRTA 8/6. Next due in Feb 2025.  Last AFP: May 2024 3.0 Check in fall 2024.   EGD 01/19/2022: -3 columns of grade 2/limited grade 3 varices -Portal hypertensive gastropathy -Friable gastric  mucosa -Continue Inderal daily -No repeat EGD needed as long as no evidence of upper GI bleed     Colonoscopy Jan 2020: colonic lipoma, tubular adenomas, random colonic biopsies negative.   Past Medical History:  Diagnosis Date   Anxiety    Asthma    Bipolar 1 disorder (HCC)    Chronic diarrhea    Cirrhosis (HCC)    Diagnosed September 2020, likely secondary to [facility], s/p Hep A/B vaccination in 2021.    Depression    Diabetes (HCC)    Essential tremor    GERD (gastroesophageal reflux disease)    Headache    High cholesterol     Past Surgical History:  Procedure Laterality Date   ABDOMINAL HYSTERECTOMY  1997   BIOPSY  01/17/2018   Procedure: BIOPSY;  Surgeon: Corbin Ade, MD;  [facility];  Service: Endoscopy;;  colon   carpal tunnel Bilateral 1999, 06/2009   COLONOSCOPY  2013   Dr. Teena Dunk: no colon polyps. quality of prep was fair. repeat colonoscopy in five years.    COLONOSCOPY WITH PROPOFOL N/A 01/17/2018   Dr. Jena Gauss: Colonic lipoma, 2 tubular adenomas removed.  Random colon biopsies negative.  Next colonoscopy 5 years.   ESOPHAGOGASTRODUODENOSCOPY (EGD) WITH PROPOFOL N/A 04/06/2019   Procedure: ESOPHAGOGASTRODUODENOSCOPY (EGD) WITH PROPOFOL;  Surgeon: Corbin Ade, MD;   Normal esophagus, mild portal hypertensive gastropathy, otherwise normal exam.  Recommend repeat EGD in 2 years for variceal screening.    ESOPHAGOGASTRODUODENOSCOPY (EGD) WITH PROPOFOL N/A 01/19/2022   Procedure: ESOPHAGOGASTRODUODENOSCOPY (EGD)  WITH PROPOFOL;  Surgeon: Corbin Ade, MD;  [facility];  Service: Endoscopy;  Laterality: N/A;  12:45 pm, pt knows to arrive at 9:15   POLYPECTOMY  01/17/2018   Procedure: POLYPECTOMY;  Surgeon: Corbin Ade, MD;  Location: [facility];  Service: Endoscopy;;  colon   SKIN CANCER EXCISION  2007   TONSILLECTOMY  1965   ulner nerve  Left 06/2009   decompression    Current Outpatient Medications  Medication Sig Dispense Refill    acetaminophen (TYLENOL) 500 MG tablet Take 1,000 mg by mouth 2 (two) times daily as needed for moderate pain or headache.     buPROPion (WELLBUTRIN XL) 150 MG 24 hr tablet Take 1 tablet (150 mg total) by mouth every morning. 30 tablet 2   cetirizine (ZYRTEC) 10 MG tablet Take 10 mg by mouth daily. Aller-Tec     Cholecalciferol (VITAMIN D3) 50 MCG (2000 UT) TABS Take 2,000 Units by mouth daily.      diazepam (VALIUM) 10 MG tablet TAKE ONE TABLET BY MOUTH EVERYDAY AT BEDTIME (Patient taking differently: Take 10 mg by mouth at bedtime. TAKE ONE TABLET BY MOUTH EVERYDAY AT BEDTIME) 60 tablet 2   diazepam (VALIUM) 2 MG tablet Take 1 tablet (2 mg total) by mouth daily as needed for anxiety. 30 tablet 2   estrogens, conjugated, (PREMARIN) 1.25 MG tablet Take 1.25 mg by mouth daily.      ezetimibe (ZETIA) 10 MG tablet Take 10 mg by mouth daily.     fluticasone (FLONASE) 50 MCG/ACT nasal spray Place 2 sprays into both nostrils daily.     furosemide (LASIX) 20 MG tablet Every Mon, Wed, Frid 30 tablet 10   hyoscyamine (LEVSIN SL) 0.125 MG SL tablet Place 1 tablet (0.125 mg total) under the tongue every 6 (six) hours as needed for cramping. 30 tablet 1   Lactobacillus-Inulin (CULTURELLE DIGESTIVE HEALTH) CAPS Take 1 capsule by mouth daily.     loperamide (IMODIUM) 2 MG capsule Take 2 mg by mouth daily as needed for diarrhea or loose stools.     omeprazole (PRILOSEC) 40 MG capsule TAKE 1 CAPSULE BY MOUTH AT BREAKFAST AND AT BEDTIME *REFILL REQUEST* 60 capsule 10   ondansetron (ZOFRAN) 4 MG tablet TAKE 1 TABLET BY MOUTH EVERY 8 HOURS AS NEEDED FOR NAUSEA & VOMITING 30 tablet 3   propranolol ER (INDERAL LA) 80 MG 24 hr capsule Take 1 capsule (80 mg total) by mouth daily. 30 capsule 2   spironolactone (ALDACTONE) 50 MG tablet Every Monday, Wed, Friday, with Lasix. 30 tablet 1   trimethoprim (TRIMPEX) 100 MG tablet Take 1 tablet (100 mg total) by mouth daily. 30 tablet 11   HYDROcodone-acetaminophen (NORCO/VICODIN)  5-325 MG tablet Take 1 tablet by mouth every 8 (eight) hours as needed. (Patient not taking: Reported on 09/17/2022)     No current facility-administered medications for this visit.    Allergies as of 09/17/2022 - Review Complete 09/17/2022  Allergen Reaction Noted   Pramipexole Other (See Comments) 04/24/2015   Crestor [rosuvastatin calcium]  03/28/2019   Bee pollen Other (See Comments) 04/24/2015   Pollen extract Other (See Comments) 04/24/2015    Family History  Problem Relation Age of Onset   Diabetes Mother    Brain cancer Mother    Diabetes Father    Heart attack Father    Heart disease Father    Diabetes Sister    Bipolar disorder Brother    Diabetes Brother  Heart disease Brother    Colon cancer Neg Hx    Breast cancer Neg Hx     Social History   Socioeconomic History   Marital status: Divorced    Spouse name: Not on file   Number of children: 1   Years of education: 12   Highest education level: Not on file  Occupational History   Occupation: retired    Comment: employed at [employer]  Tobacco Use   Smoking status: Every Day    Current packs/day: 0.50    Average packs/day: 0.5 packs/day for 43.0 years (21.5 ttl pk-yrs)    Types: Cigarettes   Smokeless tobacco: Never   Tobacco comments:    smoking since 72yrs old.    Vaping Use   Vaping status: Never Used  Substance and Sexual Activity   Alcohol use: No    Alcohol/week: 0.0 standard drinks of alcohol   Drug use: No   Sexual activity: Not Currently  Other Topics Concern   Not on file  Social History Narrative   Lives alone.  Retired.  Education 12th grade.  One child.     Caffeine use- sodas, 2 daily   Social Determinants of Health   Financial Resource Strain: Low Risk  (11/10/2021)   Received from [facility], [facility]   Overall Financial Resource Strain (CARDIA)    Difficulty of Paying Living Expenses: Not hard at all  Food Insecurity: No Food Insecurity (11/10/2021)    Received from [facility], [facility]   Hunger Vital Sign    Worried About Running Out of Food in the Last Year: Never true    Ran Out of Food in the Last Year: Never true  Transportation Needs: No Transportation Needs (11/10/2021)   Received from [facility], [facility]   [facility] - Transportation    Lack of Transportation (Medical): No    Lack of Transportation (Non-Medical): No  Physical Activity: Inactive (11/10/2021)   Received from [facility], [facility]   Exercise Vital Sign    Days of Exercise per Week: 0 days    Minutes of Exercise per Session: 0 min  Stress: No Stress Concern Present (11/10/2021)   Received from [facility], [facility] - Occupational Stress Questionnaire    Feeling of Stress : Not at all  Social Connections: Socially Isolated (11/10/2021)   Received from [facility], [facility]   Social Connection and Isolation Panel [NHANES]    Frequency of Communication with Friends and Family: More than three times a week    Frequency of Social Gatherings with Friends and Family: Once a week    Attends Religious Services: Never    Database administrator or Organizations: No    Attends Banker Meetings: Never    Marital Status: Divorced  Catering manager Violence: Not At Risk (11/10/2021)   Received from [facility], [facility]   Humiliation, Afraid, Rape, and Kick questionnaire    Fear of Current or Ex-Partner: No    Emotionally Abused: No    Physically Abused: No    Sexually Abused: No     Review of Systems   Gen: Denies any fever, chills, fatigue, weight loss, lack of appetite.  CV: Denies chest pain, heart palpitations, peripheral edema, syncope.  Resp: Denies shortness of breath at rest or with exertion. Denies wheezing or cough.  GI: Denies dysphagia or  odynophagia. Denies jaundice, hematemesis, fecal incontinence. GU : Denies urinary burning,  urinary frequency, urinary hesitancy MS: Denies joint pain, muscle weakness, cramps, or limitation of movement.  Derm: Denies rash, itching, dry skin Psych: Denies depression, anxiety, memory loss, and confusion Heme: Denies bruising, bleeding, and enlarged lymph nodes.   Physical Exam   BP 116/70 (BP Location: Right Arm, Patient Position: Sitting, Cuff Size: Normal)   Pulse (!) 57   Temp 97.6 F (36.4 C) (Oral)   Ht 5\' 6"  (1.676 m)   Wt 138 lb 6.4 oz (62.8 kg)   SpO2 98%   BMI 22.34 kg/m  General:   Alert and oriented. Pleasant and cooperative. Well-nourished and well-developed.  Head:  Normocephalic and atraumatic. Eyes:  Without icterus Abdomen:  +BS, soft, non-tender and non-distended. No HSM noted. No guarding or rebound. No masses appreciated.  Rectal:  Deferred  Msk:  Symmetrical without gross deformities. Normal posture. Extremities:  Without edema. Neurologic:  Alert and  oriented x4;  grossly normal neurologically. Skin:  Intact without significant lesions or rashes. Psych:  Alert and cooperative. Normal mood and affect.   Assessment   ZYRIANNA KOHN is a 72 y.o. female presenting today with a history of  MASH cirrhosis, chronic diarrhea, chronic nausea.   Decompensated cirrhosis: earlier this year with first ever para on 5/15 and again on 6/5. She is now doing well on low dose diuretics (furosemide 20 mg and spironolactone 50 mg). EGD on file from Jan 2024 and remains on non-selective beta blocker for variceal bleeding prophylaxis. Imaging and AFP tumor marker up to date. Standard labs today.   Diarrhea: evaluation as noted above. Trial Zenpep again, as she noted improvement with this in the past. She is to let us know how this does. If no improvement, can send in short course of Xifaxan.     PLAN    Continue Lasix 20 mg daily and spironolactone 50 mg daily Continue propanolol dosing [place] due in Feb 2024 AFP at next office visit in 3 months. CMP, INR today  for MELD labs Trial of Zenpep 40,000 units If no improvement with Zenpep, trial Xifaxan X 14 days No EGD unless further decompensation or signs of bleeding 3 month follow-up   Andrea Mink, PhD, ANP-BC [facility] Gastroenterology

## 2022-09-17 NOTE — Telephone Encounter (Signed)
Sent to her pharmacy. 

## 2022-09-18 ENCOUNTER — Telehealth: Payer: Self-pay

## 2022-09-18 LAB — COMPREHENSIVE METABOLIC PANEL
ALT: 12 IU/L (ref 0–32)
AST: 19 IU/L (ref 0–40)
Albumin: 4.5 g/dL (ref 3.8–4.8)
Alkaline Phosphatase: 136 IU/L — ABNORMAL HIGH (ref 44–121)
BUN/Creatinine Ratio: 10 — ABNORMAL LOW (ref 12–28)
BUN: 12 mg/dL (ref 8–27)
Bilirubin Total: 1.1 mg/dL (ref 0.0–1.2)
CO2: 24 mmol/L (ref 20–29)
Calcium: 9.7 mg/dL (ref 8.7–10.3)
Chloride: 102 mmol/L (ref 96–106)
Creatinine, Ser: 1.22 mg/dL — ABNORMAL HIGH (ref 0.57–1.00)
Globulin, Total: 2.7 g/dL (ref 1.5–4.5)
Glucose: 97 mg/dL (ref 70–99)
Potassium: 5.1 mmol/L (ref 3.5–5.2)
Sodium: 139 mmol/L (ref 134–144)
Total Protein: 7.2 g/dL (ref 6.0–8.5)
eGFR: 47 mL/min/{1.73_m2} — ABNORMAL LOW (ref >59)

## 2022-09-18 LAB — PROTIME-INR
INR: 1.1 (ref 0.9–1.2)
Prothrombin Time: 11.8 s (ref 9.1–12.0)

## 2022-09-18 NOTE — Telephone Encounter (Signed)
Phoned back to [facility] clarifying Rx for Spironolactone to be taken 50 mg everyday with her Lasix. With one refill per Lewie Loron

## 2022-09-24 ENCOUNTER — Encounter (HOSPITAL_COMMUNITY): Payer: Self-pay | Admitting: *Deleted

## 2022-09-24 ENCOUNTER — Inpatient Hospital Stay (HOSPITAL_COMMUNITY)
Admission: EM | Admit: 2022-09-24 | Discharge: 2022-09-26 | DRG: 442 | Disposition: A | Discharge status: Home / Self Care | Payer: 59 | Attending: Internal Medicine | Admitting: Internal Medicine

## 2022-09-24 ENCOUNTER — Other Ambulatory Visit: Payer: Self-pay | Admitting: *Deleted

## 2022-09-24 ENCOUNTER — Other Ambulatory Visit: Payer: Self-pay

## 2022-09-24 DIAGNOSIS — G8929 Other chronic pain: Secondary | ICD-10-CM | POA: Diagnosis present

## 2022-09-24 DIAGNOSIS — Z8249 Family history of ischemic heart disease and other diseases of the circulatory system: Secondary | ICD-10-CM

## 2022-09-24 DIAGNOSIS — K580 Irritable bowel syndrome with diarrhea: Secondary | ICD-10-CM | POA: Diagnosis not present

## 2022-09-24 DIAGNOSIS — F319 Bipolar disorder, unspecified: Secondary | ICD-10-CM | POA: Diagnosis not present

## 2022-09-24 DIAGNOSIS — D124 Benign neoplasm of descending colon: Secondary | ICD-10-CM | POA: Diagnosis present

## 2022-09-24 DIAGNOSIS — Z8719 Personal history of other diseases of the digestive system: Secondary | ICD-10-CM

## 2022-09-24 DIAGNOSIS — K625 Hemorrhage of anus and rectum: Principal | ICD-10-CM

## 2022-09-24 DIAGNOSIS — K7469 Other cirrhosis of liver: Secondary | ICD-10-CM | POA: Diagnosis not present

## 2022-09-24 DIAGNOSIS — I8500 Esophageal varices without bleeding: Secondary | ICD-10-CM | POA: Diagnosis not present

## 2022-09-24 DIAGNOSIS — J45909 Unspecified asthma, uncomplicated: Secondary | ICD-10-CM | POA: Diagnosis present

## 2022-09-24 DIAGNOSIS — E782 Mixed hyperlipidemia: Secondary | ICD-10-CM | POA: Diagnosis not present

## 2022-09-24 DIAGNOSIS — Z85828 Personal history of other malignant neoplasm of skin: Secondary | ICD-10-CM | POA: Diagnosis not present

## 2022-09-24 DIAGNOSIS — R161 Splenomegaly, not elsewhere classified: Secondary | ICD-10-CM | POA: Diagnosis present

## 2022-09-24 DIAGNOSIS — F419 Anxiety disorder, unspecified: Secondary | ICD-10-CM | POA: Diagnosis present

## 2022-09-24 DIAGNOSIS — Z79890 Hormone replacement therapy (postmenopausal): Secondary | ICD-10-CM

## 2022-09-24 DIAGNOSIS — E875 Hyperkalemia: Secondary | ICD-10-CM | POA: Diagnosis present

## 2022-09-24 DIAGNOSIS — K648 Other hemorrhoids: Secondary | ICD-10-CM | POA: Diagnosis present

## 2022-09-24 DIAGNOSIS — D123 Benign neoplasm of transverse colon: Secondary | ICD-10-CM | POA: Diagnosis not present

## 2022-09-24 DIAGNOSIS — K766 Portal hypertension: Secondary | ICD-10-CM | POA: Diagnosis not present

## 2022-09-24 DIAGNOSIS — F17210 Nicotine dependence, cigarettes, uncomplicated: Secondary | ICD-10-CM | POA: Diagnosis not present

## 2022-09-24 DIAGNOSIS — K219 Gastro-esophageal reflux disease without esophagitis: Secondary | ICD-10-CM | POA: Diagnosis present

## 2022-09-24 DIAGNOSIS — K3189 Other diseases of stomach and duodenum: Secondary | ICD-10-CM | POA: Diagnosis not present

## 2022-09-24 DIAGNOSIS — I8510 Secondary esophageal varices without bleeding: Secondary | ICD-10-CM | POA: Diagnosis present

## 2022-09-24 DIAGNOSIS — Z833 Family history of diabetes mellitus: Secondary | ICD-10-CM

## 2022-09-24 DIAGNOSIS — E119 Type 2 diabetes mellitus without complications: Secondary | ICD-10-CM | POA: Diagnosis present

## 2022-09-24 DIAGNOSIS — D696 Thrombocytopenia, unspecified: Secondary | ICD-10-CM | POA: Diagnosis not present

## 2022-09-24 DIAGNOSIS — K7460 Unspecified cirrhosis of liver: Secondary | ICD-10-CM | POA: Diagnosis not present

## 2022-09-24 DIAGNOSIS — K7581 Nonalcoholic steatohepatitis (NASH): Secondary | ICD-10-CM | POA: Diagnosis present

## 2022-09-24 DIAGNOSIS — K644 Residual hemorrhoidal skin tags: Secondary | ICD-10-CM | POA: Diagnosis not present

## 2022-09-24 DIAGNOSIS — K922 Gastrointestinal hemorrhage, unspecified: Secondary | ICD-10-CM | POA: Diagnosis present

## 2022-09-24 DIAGNOSIS — Z818 Family history of other mental and behavioral disorders: Secondary | ICD-10-CM

## 2022-09-24 DIAGNOSIS — Z79899 Other long term (current) drug therapy: Secondary | ICD-10-CM | POA: Diagnosis not present

## 2022-09-24 DIAGNOSIS — D62 Acute posthemorrhagic anemia: Secondary | ICD-10-CM | POA: Diagnosis present

## 2022-09-24 DIAGNOSIS — R899 Unspecified abnormal finding in specimens from other organs, systems and tissues: Secondary | ICD-10-CM

## 2022-09-24 DIAGNOSIS — K047 Periapical abscess without sinus: Secondary | ICD-10-CM | POA: Diagnosis not present

## 2022-09-24 DIAGNOSIS — K529 Noninfective gastroenteritis and colitis, unspecified: Secondary | ICD-10-CM | POA: Diagnosis not present

## 2022-09-24 DIAGNOSIS — Z90710 Acquired absence of both cervix and uterus: Secondary | ICD-10-CM

## 2022-09-24 DIAGNOSIS — D122 Benign neoplasm of ascending colon: Secondary | ICD-10-CM | POA: Diagnosis not present

## 2022-09-24 LAB — CBC WITH DIFFERENTIAL/PLATELET
Abs Immature Granulocytes: 0.01 10*3/uL (ref 0.00–0.07)
Basophils Absolute: 0 10*3/uL (ref 0.0–0.1)
Basophils Relative: 0 %
Eosinophils Absolute: 0 10*3/uL (ref 0.0–0.5)
Eosinophils Relative: 1 %
HCT: 34.7 % — ABNORMAL LOW (ref 36.0–46.0)
Hemoglobin: 11.5 g/dL — ABNORMAL LOW (ref 12.0–15.0)
Immature Granulocytes: 0 %
Lymphocytes Relative: 27 %
Lymphs Abs: 0.9 10*3/uL (ref 0.7–4.0)
MCH: 29.6 pg (ref 26.0–34.0)
MCHC: 33.1 g/dL (ref 30.0–36.0)
MCV: 89.2 fL (ref 80.0–100.0)
Monocytes Absolute: 0.2 10*3/uL (ref 0.1–1.0)
Monocytes Relative: 6 %
Neutro Abs: 2.3 10*3/uL (ref 1.7–7.7)
Neutrophils Relative %: 66 %
Platelets: 48 10*3/uL — ABNORMAL LOW (ref 150–400)
RBC: 3.89 MIL/uL (ref 3.87–5.11)
RDW: 15.4 % (ref 11.5–15.5)
WBC: 3.5 10*3/uL — ABNORMAL LOW (ref 4.0–10.5)
nRBC: 0 % (ref 0.0–0.2)

## 2022-09-24 LAB — PROTIME-INR
INR: 1.2 (ref 0.8–1.2)
Prothrombin Time: 15.2 s (ref 11.4–15.2)

## 2022-09-24 LAB — CBC
HCT: 39.4 % (ref 36.0–46.0)
HCT: 32.9 % — ABNORMAL LOW (ref 36.0–46.0)
Hemoglobin: 13 g/dL (ref 12.0–15.0)
Hemoglobin: 11.1 g/dL — ABNORMAL LOW (ref 12.0–15.0)
MCH: 29.4 pg (ref 26.0–34.0)
MCH: 30.3 pg (ref 26.0–34.0)
MCHC: 33 g/dL (ref 30.0–36.0)
MCHC: 33.7 g/dL (ref 30.0–36.0)
MCV: 89.1 fL (ref 80.0–100.0)
MCV: 89.9 fL (ref 80.0–100.0)
Platelets: 45 10*3/uL — ABNORMAL LOW (ref 150–400)
RBC: 4.42 MIL/uL (ref 3.87–5.11)
RBC: 3.66 MIL/uL — ABNORMAL LOW (ref 3.87–5.11)
RDW: 15.6 % — ABNORMAL HIGH (ref 11.5–15.5)
RDW: 15.4 % (ref 11.5–15.5)
WBC: 4.4 10*3/uL (ref 4.0–10.5)
WBC: 3 10*3/uL — ABNORMAL LOW (ref 4.0–10.5)
nRBC: 0 % (ref 0.0–0.2)
nRBC: 0 % (ref 0.0–0.2)

## 2022-09-24 LAB — COMPREHENSIVE METABOLIC PANEL
ALT: 18 U/L (ref 0–44)
AST: 23 U/L (ref 15–41)
Albumin: 4.1 g/dL (ref 3.5–5.0)
Alkaline Phosphatase: 104 U/L (ref 38–126)
Anion gap: 8 (ref 5–15)
BUN: 17 mg/dL (ref 8–23)
CO2: 23 mmol/L (ref 22–32)
Calcium: 9.2 mg/dL (ref 8.9–10.3)
Chloride: 106 mmol/L (ref 98–111)
Creatinine, Ser: 1.13 mg/dL — ABNORMAL HIGH (ref 0.44–1.00)
GFR, Estimated: 52 mL/min — ABNORMAL LOW (ref >60)
Glucose, Bld: 83 mg/dL (ref 70–99)
Potassium: 5.2 mmol/L — ABNORMAL HIGH (ref 3.5–5.1)
Sodium: 137 mmol/L (ref 135–145)
Total Bilirubin: 1.4 mg/dL — ABNORMAL HIGH (ref 0.3–1.2)
Total Protein: 7.9 g/dL (ref 6.5–8.1)

## 2022-09-24 LAB — TYPE AND SCREEN
ABO/RH(D): B POS
Antibody Screen: NEGATIVE

## 2022-09-24 LAB — POC OCCULT BLOOD, ED: Fecal Occult Bld: POSITIVE — AB

## 2022-09-24 MED ORDER — OCTREOTIDE LOAD VIA INFUSION
100.0000 ug | Freq: Once | INTRAVENOUS | Status: AC
  Administered 2022-09-24: 100 ug via INTRAVENOUS
  Filled 2022-09-24: qty 50

## 2022-09-24 MED ORDER — ALBUTEROL SULFATE (2.5 MG/3ML) 0.083% IN NEBU: 2.5000 mg | INHALATION_SOLUTION | RESPIRATORY_TRACT | Status: DC | PRN

## 2022-09-24 MED ORDER — TRIMETHOPRIM 100 MG PO TABS: 100.0000 mg | ORAL_TABLET | Freq: Every day | ORAL | Status: DC

## 2022-09-24 MED ORDER — SODIUM CHLORIDE 0.9 % IV BOLUS
1000.0000 mL | Freq: Once | INTRAVENOUS | Status: AC
  Administered 2022-09-24: 1000 mL via INTRAVENOUS

## 2022-09-24 MED ORDER — PANTOPRAZOLE SODIUM 40 MG IV SOLR: 40.0000 mg | Freq: Two times a day (BID) | INTRAVENOUS | Status: DC

## 2022-09-24 MED ORDER — NICOTINE 14 MG/24HR TD PT24
14.0000 mg | MEDICATED_PATCH | Freq: Every day | TRANSDERMAL | Status: DC
  Administered 2022-09-26: 14 mg via TRANSDERMAL
  Filled 2022-09-24 (×2): qty 1

## 2022-09-24 MED ORDER — SODIUM CHLORIDE 0.9 % IV SOLN
2.0000 g | Freq: Once | INTRAVENOUS | Status: AC
  Administered 2022-09-24: 2 g via INTRAVENOUS
  Filled 2022-09-24: qty 20

## 2022-09-24 MED ORDER — ONDANSETRON HCL 4 MG/2ML IJ SOLN
4.0000 mg | Freq: Four times a day (QID) | INTRAMUSCULAR | Status: DC | PRN
  Administered 2022-09-25: 4 mg via INTRAVENOUS
  Filled 2022-09-24: qty 2

## 2022-09-24 MED ORDER — PANCRELIPASE (LIP-PROT-AMYL) 40000-126000 UNITS PO CPEP
1.0000 | ORAL_CAPSULE | Freq: Every day | ORAL | Status: DC
  Filled 2022-09-24 (×4): qty 1

## 2022-09-24 MED ORDER — PANTOPRAZOLE SODIUM 40 MG IV SOLR
40.0000 mg | Freq: Once | INTRAVENOUS | Status: AC
  Administered 2022-09-24: 40 mg via INTRAVENOUS
  Filled 2022-09-24: qty 10

## 2022-09-24 MED ORDER — SODIUM CHLORIDE 0.9 % IV SOLN
2.0000 g | INTRAVENOUS | Status: DC
  Administered 2022-09-25: 2 g via INTRAVENOUS
  Filled 2022-09-24: qty 20

## 2022-09-24 MED ORDER — DIAZEPAM 2 MG PO TABS: 2.0000 mg | ORAL_TABLET | Freq: Every day | ORAL | Status: DC | PRN

## 2022-09-24 MED ORDER — PANTOPRAZOLE INFUSION (NEW) - SIMPLE MED
8.0000 mg/h | INTRAVENOUS | Status: DC
  Administered 2022-09-24 (×2): 8 mg/h via INTRAVENOUS
  Filled 2022-09-24 (×5): qty 100

## 2022-09-24 MED ORDER — SODIUM CHLORIDE 0.9 % IV SOLN
50.0000 ug/h | INTRAVENOUS | Status: DC
  Administered 2022-09-24 (×2): 50 ug/h via INTRAVENOUS
  Filled 2022-09-24 (×5): qty 1

## 2022-09-24 MED ORDER — AMOXICILLIN 250 MG PO CAPS: 500.0000 mg | ORAL_CAPSULE | Freq: Four times a day (QID) | ORAL | Status: DC

## 2022-09-24 MED ORDER — BUPROPION HCL ER (XL) 150 MG PO TB24
150.0000 mg | ORAL_TABLET | Freq: Every morning | ORAL | Status: DC
  Administered 2022-09-26: 150 mg via ORAL
  Filled 2022-09-24: qty 1

## 2022-09-24 MED ORDER — PROPRANOLOL HCL ER 80 MG PO CP24
80.0000 mg | ORAL_CAPSULE | Freq: Every day | ORAL | Status: DC
  Filled 2022-09-24 (×4): qty 1

## 2022-09-24 MED ORDER — PANTOPRAZOLE 80MG IVPB - SIMPLE MED
80.0000 mg | Freq: Once | INTRAVENOUS | Status: AC
  Administered 2022-09-24: 80 mg via INTRAVENOUS
  Filled 2022-09-24: qty 100

## 2022-09-24 MED ORDER — ONDANSETRON HCL 4 MG PO TABS: 4.0000 mg | ORAL_TABLET | Freq: Four times a day (QID) | ORAL | Status: DC | PRN

## 2022-09-24 NOTE — H&P (Signed)
TRH H&P   Patient Demographics:    Monike Linen, is a 72 y.o. female  MRN: [id number]   DOB - 10/15/1950  Admit Date - 09/24/2022  Outpatient Primary MD for the patient is Burdine, Ananias Pilgrim, MD  Referring MD/NP/PA: Dr Estell Harpin  Patient coming from: home  Chief Complaint  Patient presents with   Rectal Bleeding      HPI:    Emine Fazenbaker  is a 72 y.o. female, with past medical history of [name] cirrhosis, chronic diarrhea, chronic nausea, hyperlipidemia, essential tremors, bipolar disorder, Beatties mellitus not on medications anymore, given controlled blood glucose. -Patient presents to ED secondary to complaints of bright red blood per rectum, reported developed this morning, she reports as fresh blood, for couple bowel movement, followed by dark-colored stool, she denies any vomiting, coffee-ground emesis, constipation, she reports chronic diarrhea at baseline, fourth generalized weakness, fatigue, she came to ED for further evaluation, she denies being on any blood thinners. -In ED she will hemoglobin 13, did drop to 11.5, platelet low at 48K, which is around her baseline, potassium was elevated at 5.2, total bilirubin elevated at 1.4, but other LFTs within normal limit, albumin normal at 4.1, [facility] hospitalist consulted to admit.    Review of systems:     A full 10 point Review of Systems was done, except as stated above, all other Review of Systems were negative.   With Past History of the following :    Past Medical History:  Diagnosis Date   Anxiety    Asthma    Bipolar 1 disorder (HCC)    Chronic diarrhea    Cirrhosis (HCC)    Diagnosed September 2020, likely secondary to [facility], s/p Hep A/B vaccination in 2021.    Depression    Diabetes (HCC)    Essential tremor    GERD (gastroesophageal reflux disease)    Headache    High cholesterol       Past  Surgical History:  Procedure Laterality Date   ABDOMINAL HYSTERECTOMY  1997   BIOPSY  01/17/2018   Procedure: BIOPSY;  Surgeon: Corbin Ade, MD;  [facility];  Service: Endoscopy;;  colon   carpal tunnel Bilateral 1999, 06/2009   COLONOSCOPY  2013   Dr. Teena Dunk: no colon polyps. quality of prep was fair. repeat colonoscopy in five years.    COLONOSCOPY WITH PROPOFOL N/A 01/17/2018   Dr. Jena Gauss: Colonic lipoma, 2 tubular adenomas removed.  Random colon biopsies negative.  Next colonoscopy 5 years.   ESOPHAGOGASTRODUODENOSCOPY (EGD) WITH PROPOFOL N/A 04/06/2019   Procedure: ESOPHAGOGASTRODUODENOSCOPY (EGD) WITH PROPOFOL;  Surgeon: Corbin Ade, MD;   Normal esophagus, mild portal hypertensive gastropathy, otherwise normal exam.  Recommend repeat EGD in 2 years for variceal screening.    ESOPHAGOGASTRODUODENOSCOPY (EGD) WITH PROPOFOL N/A 01/19/2022   Procedure: ESOPHAGOGASTRODUODENOSCOPY (EGD) WITH PROPOFOL;  Surgeon: Corbin Ade, MD;  [facility];  Service: Endoscopy;  Laterality: N/A;  12:45 pm, pt knows to arrive at 9:15   POLYPECTOMY  01/17/2018   Procedure: POLYPECTOMY;  Surgeon: Corbin Ade, MD;  [facility];  Service: Endoscopy;;  colon   SKIN CANCER EXCISION  2007   TONSILLECTOMY  1965   ulner nerve  Left 06/2009   decompression      Social History:     Social History   Tobacco Use   Smoking status: Every Day    Current packs/day: 0.50    Average packs/day: 0.5 packs/day for 43.0 years (21.5 ttl pk-yrs)    Types: Cigarettes   Smokeless tobacco: Never   Tobacco comments:    smoking since 72yrs old.    Substance Use Topics   Alcohol use: No    Alcohol/week: 0.0 standard drinks of alcohol        Family History :     Family History  Problem Relation Age of Onset   Diabetes Mother    Brain cancer Mother    Diabetes Father    Heart attack Father    Heart disease Father    Diabetes Sister    Bipolar disorder Brother     Diabetes Brother    Heart disease Brother    Colon cancer Neg Hx    Breast cancer Neg Hx       Home Medications:   Prior to Admission medications   Medication Sig Start Date End Date Taking? Authorizing Provider  acetaminophen (TYLENOL) 500 MG tablet Take 1,000 mg by mouth 2 (two) times daily as needed for moderate pain or headache.    [provider]  buPROPion (WELLBUTRIN XL) 150 MG 24 hr tablet Take 1 tablet (150 mg total) by mouth every morning. 07/09/22   Myrlene Broker, MD  cetirizine (ZYRTEC) 10 MG tablet Take 10 mg by mouth daily. Aller-Tec    [provider]  Cholecalciferol (VITAMIN D3) 50 MCG (2000 UT) TABS Take 2,000 Units by mouth daily.     [provider]  diazepam (VALIUM) 10 MG tablet TAKE ONE TABLET BY MOUTH EVERYDAY AT BEDTIME Patient taking differently: Take 10 mg by mouth at bedtime. TAKE ONE TABLET BY MOUTH EVERYDAY AT BEDTIME 07/09/22   Myrlene Broker, MD  diazepam (VALIUM) 2 MG tablet Take 1 tablet (2 mg total) by mouth daily as needed for anxiety. 07/09/22 07/09/23  Myrlene Broker, MD  estrogens, conjugated, (PREMARIN) 1.25 MG tablet Take 1.25 mg by mouth daily.     [provider]  ezetimibe (ZETIA) 10 MG tablet Take 10 mg by mouth daily. 10/19/19   [provider]  fluticasone (FLONASE) 50 MCG/ACT nasal spray Place 2 sprays into both nostrils daily. 09/03/22   [provider]  furosemide (LASIX) 20 MG tablet Take 1 tablet (20 mg total) by mouth daily. 09/17/22   Gelene Mink, NP  HYDROcodone-acetaminophen (NORCO/VICODIN) 5-325 MG tablet Take 1 tablet by mouth every 8 (eight) hours as needed. Patient not taking: Reported on 09/17/2022 05/19/22   [provider]  hyoscyamine (LEVSIN SL) 0.125 MG SL tablet Place 1 tablet (0.125 mg total) under the tongue every 6 (six) hours as needed for cramping. 09/17/22   Gelene Mink, NP  Lactobacillus-Inulin (CULTURELLE DIGESTIVE HEALTH) CAPS Take 1 capsule by mouth daily.  11/10/21   [provider]  loperamide (IMODIUM) 2 MG capsule Take 2 mg by mouth daily as needed for diarrhea or loose stools.  [provider]  omeprazole (PRILOSEC) 40 MG capsule TAKE 1 CAPSULE BY MOUTH AT BREAKFAST AND AT BEDTIME *REFILL REQUEST* 09/10/22   Gelene Mink, NP  ondansetron (ZOFRAN) 4 MG tablet TAKE 1 TABLET BY MOUTH EVERY 8 HOURS AS NEEDED FOR NAUSEA & VOMITING 09/15/22   Gelene Mink, NP  propranolol ER (INDERAL LA) 80 MG 24 hr capsule Take 1 capsule (80 mg total) by mouth daily. 07/09/22 07/09/23  Myrlene Broker, MD  spironolactone (ALDACTONE) 50 MG tablet Every Monday, Wed, Friday, with Lasix. Patient taking differently: Take 50 mg by mouth daily. 09/16/22   Gelene Mink, NP  trimethoprim (TRIMPEX) 100 MG tablet Take 1 tablet (100 mg total) by mouth daily. 07/14/22   Donnita Falls, FNP     Allergies:     Allergies  Allergen Reactions   Pramipexole Other (See Comments)    Shaking, palpitations, headache, faint feeling   Crestor [Rosuvastatin Calcium]     Muscle pain   Bee Pollen Other (See Comments)    Eyes and nose run   Pollen Extract Other (See Comments)    Eyes and nose run     Physical Exam:   Vitals  Blood pressure 95/77, pulse 60, temperature 98.6 F (37 C), temperature source Oral, resp. rate 16, height 5\' 6"  (1.676 m), weight 62.6 kg, SpO2 100%.   1. General Developed female, lying in bed in NAD,    2. Normal affect and insight, Not Suicidal or Homicidal, Awake Alert, Oriented X 3.  3. No F.N deficits, ALL C.Nerves Intact, Strength 5/5 all 4 extremities, Sensation intact all 4 extremities, Plantars down going.  4. Ears and Eyes appear Normal, Conjunctivae clear, PERRLA. Moist Oral Mucosa.  5. Supple Neck, No JVD, No cervical lymphadenopathy appriciated, No Carotid Bruits.  6. Symmetrical Chest wall movement, Good air movement bilaterally, CTAB.  7. RRR, No Gallops, Rubs or Murmurs, No Parasternal Heave.  8. Positive Bowel  Sounds, Abdomen Soft, No tenderness, No organomegaly appriciated,No rebound -guarding or rigidity.  9.  No Cyanosis, Normal Skin Turgor, No Skin Rash or Bruise.  10. Good muscle tone,  joints appear normal , no effusions, Normal ROM.     Data Review:    CBC Recent Labs  Lab 09/24/22 1357 09/24/22 1627  WBC 4.4 3.5*  HGB 13.0 11.5*  HCT 39.4 34.7*  PLT DCLMP 48*  MCV 89.1 89.2  MCH 29.4 29.6  MCHC 33.0 33.1  RDW 15.6* 15.4  LYMPHSABS  --  0.9  MONOABS  --  0.2  EOSABS  --  0.0  BASOSABS  --  0.0   ------------------------------------------------------------------------------------------------------------------  Chemistries  Recent Labs  Lab 09/24/22 1357  NA 137  K 5.2*  CL 106  CO2 23  GLUCOSE 83  BUN 17  CREATININE 1.13*  CALCIUM 9.2  AST 23  ALT 18  ALKPHOS 104  BILITOT 1.4*   ------------------------------------------------------------------------------------------------------------------ estimated creatinine clearance is 42.7 mL/min (A) (by C-G formula based on SCr of 1.13 mg/dL (H)). ------------------------------------------------------------------------------------------------------------------ No results for input(s): "TSH", "T4TOTAL", "T3FREE", "THYROIDAB" in the last 72 hours.  Invalid input(s): "FREET3"  Coagulation profile Recent Labs  Lab 09/24/22 1357  INR 1.2   ------------------------------------------------------------------------------------------------------------------- No results for input(s): "DDIMER" in the last 72 hours. -------------------------------------------------------------------------------------------------------------------  Cardiac Enzymes No results for input(s): "CKMB", "TROPONINI", "MYOGLOBIN" in the last 168 hours.  Invalid input(s): "CK" ------------------------------------------------------------------------------------------------------------------ No results found for:  "BNP"   ---------------------------------------------------------------------------------------------------------------  Urinalysis    Component Value Date/Time   COLORURINE  YELLOW 07/25/2022 1500   APPEARANCEUR CLEAR 07/25/2022 1500   APPEARANCEUR Clear 07/14/2022 1442   LABSPEC 1.013 07/25/2022 1500   PHURINE 6.0 07/25/2022 1500   GLUCOSEU NEGATIVE 07/25/2022 1500   HGBUR NEGATIVE 07/25/2022 1500   BILIRUBINUR NEGATIVE 07/25/2022 1500   BILIRUBINUR Negative 07/14/2022 1442   KETONESUR NEGATIVE 07/25/2022 1500   PROTEINUR NEGATIVE 07/25/2022 1500   UROBILINOGEN 1.0 10/08/2015 1347   NITRITE NEGATIVE 07/25/2022 1500   LEUKOCYTESUR NEGATIVE 07/25/2022 1500    ----------------------------------------------------------------------------------------------------------------   Imaging Results:    No results found.     Assessment & Plan:    Principal Problem:   GI bleed Active Problems:   Chronic diarrhea   Hepatic cirrhosis (HCC)   Bipolar affective disorder (HCC)   Esophageal reflux   Mixed hyperlipidemia   GI bleed Acute blood loss anemia -Difficult to to differentiate if this is upper or lower, as she reports bright red blood per rectum followed by melena, but BUN stable at 17 -Hemoglobin in ED was at 13, repeat at 11.5 -Will repeat CBC this evening, in a.m. -Discussed with GI, keep clear liquid diet, n.p.o. after midnight for possible endoscopy -Keep active type and screen and transfuse as needed -SCD for DVT prophylaxis -History of esophageal varices in the past, will continue with present GTT, started on acuter drip, and IV Rocephin till esophageal bleed ruled out  Liver cirrhosis due to NASH History of esophageal varices Mild hyperbilirubinemia -Continue with propranolol (patient reports she is on propranolol for bipolar and essential tremors) -He was orthostatic, I will hold Lasix and Aldactone for now, can be resumed when more stable -Recommendations per  GI  Thrombocytopenia -In the setting of liver cirrhosis, appears to be at baseline, continue to monitor, no indication for transfusion  Recent tooth infection -Continue with p.o. amoxicillin  History of bipolar disorder -Continue with Wellbutrin, continue with as needed Valium,  Diabetes mellitus -Controlled with diet, not on medications anymore.  Hyperlipidemia -continue with Zetia  Hyperkalemia -Will hold Aldactone  Chronic diarrhea -Continue with pancrelipase, I will hold Imodium in case she will need bowel prep for colonoscopy  DVT Prophylaxis  SCDs   AM Labs Ordered, also please review Full Orders  Family Communication: Admission, patients condition and plan of care including tests being ordered have been discussed with the patient and who indicate understanding and agree with the plan and Code Status.  Code Status Full  Likely DC to  home  Condition GUARDED   Consults called: GI    Admission status: observation    Time spent in minutes : 70 minutes   Huey Bienenstock M.D on 09/24/2022 at 7:27 PM   [facility] - Office  [phone]

## 2022-09-24 NOTE — ED Notes (Signed)
ED TO INPATIENT HANDOFF REPORT  ED Nurse Name and Phone #: Manus Gunning, RN   S Name/Age/Gender Andrea Dickson 72 y.o. female Room/Bed: APA18/APA18  Code Status   Code Status: Full Code  Home/SNF/Other Home Patient oriented to: self, place, time, and situation Is this baseline? Yes   Triage Complete: Triage complete  Chief Complaint GI bleed [K92.2]  Triage Note Pt c/o lower abdominal pain with dark red and bright red stools that started this am   Allergies Allergies  Allergen Reactions   Pramipexole Other (See Comments)    Shaking, palpitations, headache, faint feeling   Crestor [Rosuvastatin Calcium]     Muscle pain   Bee Pollen Other (See Comments)    Eyes and nose run   Pollen Extract Other (See Comments)    Eyes and nose run    Level of Care/Admitting Diagnosis ED Disposition     ED Disposition  Admit   Condition  --   Comment  Hospital Area: [facility] [[id number]]  Level of Care: Telemetry [5]  Covid Evaluation: Asymptomatic - no recent exposure (last 10 days) testing not required  Diagnosis: GI bleed [161096]  Admitting Physician: Chiquita Loth  Attending Physician: Randol Kern, DAWOOD S [[id number]]          B Medical/Surgery History Past Medical History:  Diagnosis Date   Anxiety    Asthma    Bipolar 1 disorder (HCC)    Chronic diarrhea    Cirrhosis (HCC)    Diagnosed September 2020, likely secondary to [facility], s/p Hep A/B vaccination in 2021.    Depression    Diabetes (HCC)    Essential tremor    GERD (gastroesophageal reflux disease)    Headache    High cholesterol    Past Surgical History:  Procedure Laterality Date   ABDOMINAL HYSTERECTOMY  1997   BIOPSY  01/17/2018   Procedure: BIOPSY;  Surgeon: Corbin Ade, MD;  [facility];  Service: Endoscopy;;  colon   carpal tunnel Bilateral 1999, 06/2009   COLONOSCOPY  2013   Dr. Teena Dunk: no colon polyps. quality of prep was fair. repeat colonoscopy in five  years.    COLONOSCOPY WITH PROPOFOL N/A 01/17/2018   Dr. Jena Gauss: Colonic lipoma, 2 tubular adenomas removed.  Random colon biopsies negative.  Next colonoscopy 5 years.   ESOPHAGOGASTRODUODENOSCOPY (EGD) WITH PROPOFOL N/A 04/06/2019   Procedure: ESOPHAGOGASTRODUODENOSCOPY (EGD) WITH PROPOFOL;  Surgeon: Corbin Ade, MD;   Normal esophagus, mild portal hypertensive gastropathy, otherwise normal exam.  Recommend repeat EGD in 2 years for variceal screening.    ESOPHAGOGASTRODUODENOSCOPY (EGD) WITH PROPOFOL N/A 01/19/2022   Procedure: ESOPHAGOGASTRODUODENOSCOPY (EGD) WITH PROPOFOL;  Surgeon: Corbin Ade, MD;  [facility];  Service: Endoscopy;  Laterality: N/A;  12:45 pm, pt knows to arrive at 9:15   POLYPECTOMY  01/17/2018   Procedure: POLYPECTOMY;  Surgeon: Corbin Ade, MD;  [facility];  Service: Endoscopy;;  colon   SKIN CANCER EXCISION  2007   TONSILLECTOMY  1965   ulner nerve  Left 06/2009   decompression     A IV Location/Drains/Wounds Patient Lines/Drains/Airways Status     Active Line/Drains/Airways     Name Placement date Placement time Site Days   Peripheral IV 09/24/22 22 G Anterior;Left Forearm 09/24/22  1750  Forearm  less than 1   Peripheral IV 09/24/22 20 G Left Antecubital 09/24/22  1825  Antecubital  less than 1  Intake/Output Last 24 hours  Intake/Output Summary (Last 24 hours) at 09/24/2022 2006 Last data filed at 09/24/2022 1900 Gross per 24 hour  Intake 0.11 ml  Output --  Net 0.11 ml    Labs/Imaging Results for orders placed or performed during the hospital encounter of 09/24/22 (from the past 48 hour(s))  Comprehensive metabolic panel     Status: Abnormal   Collection Time: 09/24/22  1:57 PM  Result Value Ref Range   Sodium 137 135 - 145 mmol/L   Potassium 5.2 (H) 3.5 - 5.1 mmol/L   Chloride 106 98 - 111 mmol/L   CO2 23 22 - 32 mmol/L   Glucose, Bld 83 70 - 99 mg/dL    Comment: Glucose reference range applies  only to samples taken after fasting for at least 8 hours.   BUN 17 8 - 23 mg/dL   Creatinine, Ser 7.82 (H) 0.44 - 1.00 mg/dL   Calcium 9.2 8.9 - 95.6 mg/dL   Total Protein 7.9 6.5 - 8.1 g/dL   Albumin 4.1 3.5 - 5.0 g/dL   AST 23 15 - 41 U/L   ALT 18 0 - 44 U/L   Alkaline Phosphatase 104 38 - 126 U/L   Total Bilirubin 1.4 (H) 0.3 - 1.2 mg/dL   GFR, Estimated 52 (L) >60 mL/min    Comment: (NOTE) Calculated using the CKD-EPI Creatinine Equation (2021)    Anion gap 8 5 - 15    Comment: Performed at [facility], [address], [place], [place] [address]  CBC     Status: Abnormal   Collection Time: 09/24/22  1:57 PM  Result Value Ref Range   WBC 4.4 4.0 - 10.5 K/uL   RBC 4.42 3.87 - 5.11 MIL/uL   Hemoglobin 13.0 12.0 - 15.0 g/dL   HCT 65.7 84.6 - 96.2 %   MCV 89.1 80.0 - 100.0 fL   MCH 29.4 26.0 - 34.0 pg   MCHC 33.0 30.0 - 36.0 g/dL   RDW 95.2 (H) 84.1 - 32.4 %   Platelets DCLMP 150 - 400 K/uL    Comment: SPECIMEN CHECKED FOR CLOTS Immature Platelet Fraction may be clinically indicated, consider ordering this additional test [id number] PLATELETS APPEAR DECREASED    nRBC 0.0 0.0 - 0.2 %    Comment: Performed at [facility], [address], [place], [place] [address]  Type and screen [facility]     Status: None   Collection Time: 09/24/22  1:57 PM  Result Value Ref Range   ABO/RH(D) B POS    Antibody Screen NEG    Sample Expiration      09/27/2022,2359 Performed at [facility], [address], [place], [place] [address]   Protime-INR     Status: None   Collection Time: 09/24/22  1:57 PM  Result Value Ref Range   Prothrombin Time 15.2 11.4 - 15.2 seconds   INR 1.2 0.8 - 1.2    Comment: (NOTE) INR goal varies based on device and disease states. Performed at [facility], [address] [place], [place] [address]   POC occult blood, ED     Status: Abnormal   Collection Time: 09/24/22  3:30 PM  Result Value Ref Range   Fecal Occult Bld POSITIVE (A) NEGATIVE   CBC with Differential     Status: Abnormal   Collection Time: 09/24/22  4:27 PM  Result Value Ref Range   WBC 3.5 (L) 4.0 - 10.5 K/uL   RBC 3.89 3.87 - 5.11 MIL/uL  Hemoglobin 11.5 (L) 12.0 - 15.0 g/dL   HCT 16.1 (L) 09.6 - 04.5 %   MCV 89.2 80.0 - 100.0 fL   MCH 29.6 26.0 - 34.0 pg   MCHC 33.1 30.0 - 36.0 g/dL   RDW 40.9 81.1 - 91.4 %   Platelets 48 (L) 150 - 400 K/uL    Comment: SPECIMEN CHECKED FOR CLOTS Immature Platelet Fraction may be clinically indicated, consider ordering this additional test [id number] REPEATED TO VERIFY PLATELET COUNT CONFIRMED BY SMEAR    nRBC 0.0 0.0 - 0.2 %   Neutrophils Relative % 66 %   Neutro Abs 2.3 1.7 - 7.7 K/uL   Lymphocytes Relative 27 %   Lymphs Abs 0.9 0.7 - 4.0 K/uL   Monocytes Relative 6 %   Monocytes Absolute 0.2 0.1 - 1.0 K/uL   Eosinophils Relative 1 %   Eosinophils Absolute 0.0 0.0 - 0.5 K/uL   Basophils Relative 0 %   Basophils Absolute 0.0 0.0 - 0.1 K/uL   WBC Morphology MORPHOLOGY UNREMARKABLE    Smear Review PLATELET COUNT CONFIRMED BY SMEAR    Immature Granulocytes 0 %   Abs Immature Granulocytes 0.01 0.00 - 0.07 K/uL   Ovalocytes PRESENT     Comment: Performed at [facility], [address] [place], [place] [address]   No results found.  Pending Labs Unresulted Labs (From admission, onward)     Start     Ordered   09/25/22 0500  CBC  Daily,   R      09/24/22 1903   09/25/22 0500  Comprehensive metabolic panel  Tomorrow morning,   R        09/24/22 1903   09/24/22 2300  CBC  Once-Timed,   TIMED        09/24/22 1903            Vitals/Pain Today's Vitals   09/24/22 1630 09/24/22 1645 09/24/22 1733 09/24/22 1937  BP: (!) 110/56 113/61 95/77 (!) 116/58  Pulse: 60 60  62  Resp:    18  Temp:      TempSrc:      SpO2: 99% 100%  100%  Weight:      Height:      PainSc:        Isolation Precautions No active isolations  Medications Medications  octreotide (SANDOSTATIN) 2 mcg/mL load via infusion 100  mcg (100 mcg Intravenous Bolus from Bag 09/24/22 1853)    And  octreotide (SANDOSTATIN) 500 mcg in sodium chloride 0.9 % 250 mL (2 mcg/mL) infusion (50 mcg/hr Intravenous New Bag/Given 09/24/22 1900)  ondansetron (ZOFRAN) tablet 4 mg (has no administration in time range)    Or  ondansetron (ZOFRAN) injection 4 mg (has no administration in time range)  albuterol (PROVENTIL) (2.5 MG/3ML) 0.083% nebulizer solution 2.5 mg (has no administration in time range)  nicotine (NICODERM CQ - dosed in mg/24 hours) patch 14 mg (has no administration in time range)  cefTRIAXone (ROCEPHIN) 2 g in sodium chloride 0.9 % 100 mL IVPB (has no administration in time range)  pantoprozole (PROTONIX) 80 mg /NS 100 mL infusion (8 mg/hr Intravenous New Bag/Given 09/24/22 1936)  pantoprazole (PROTONIX) injection 40 mg (has no administration in time range)  buPROPion (WELLBUTRIN XL) 24 hr tablet 150 mg (has no administration in time range)  diazepam (VALIUM) tablet 2 mg (has no administration in time range)  Pancrelipase (Lip-Prot-Amyl) 40000-126000 units CPEP 40,000 Units (has no administration in time range)  propranolol ER (INDERAL LA) 24  hr capsule 80 mg (has no administration in time range)  sodium chloride 0.9 % bolus 1,000 mL (0 mLs Intravenous Stopped 09/24/22 1900)  pantoprazole (PROTONIX) injection 40 mg (40 mg Intravenous Given 09/24/22 1758)  cefTRIAXone (ROCEPHIN) 2 g in sodium chloride 0.9 % 100 mL IVPB (0 g Intravenous Stopped 09/24/22 1843)  pantoprazole (PROTONIX) 80 mg /NS 100 mL IVPB (0 mg Intravenous Stopped 09/24/22 1934)    Mobility walks     Focused Assessments HGB    R Recommendations: See Admitting Provider Note  Report given to:   Additional Notes:   Alert, oriented, and ambulatory at baseline. Had several bloody bowel movements. Denies any anticoagulants. Trending H&H from 13 to 11. Octreotide and Protonix infusion started. Able to complete all adl's.

## 2022-09-24 NOTE — ED Triage Notes (Signed)
Pt c/o lower abdominal pain with dark red and bright red stools that started this am

## 2022-09-24 NOTE — ED Provider Notes (Signed)
[place] [facility] AT [facility] Provider Note   CSN: [id number] Arrival date & time: 09/24/22  1150     History  Chief Complaint  Patient presents with   Rectal Bleeding    Andrea Dickson is a 72 y.o. female.  Patient states she has been having some bright red blood and also some black blood from her rectum today.  Patient has a history of cirrhosis and varices.  The history is provided by the patient and medical records. No language interpreter was used.  Rectal Bleeding Quality:  Bright red Amount:  Moderate Timing:  Intermittent Chronicity:  New Similar prior episodes: no   Relieved by:  Nothing Ineffective treatments:  None tried Associated symptoms: no abdominal pain   Risk factors: no anticoagulant use        Home Medications Prior to Admission medications   Medication Sig Start Date End Date Taking? Authorizing Provider  acetaminophen-codeine (TYLENOL #3) 300-30 MG tablet Take 1 tablet by mouth every 4 (four) hours as needed. 09/17/22  Yes [provider]  amoxicillin (AMOXIL) 500 MG capsule Take 500 mg by mouth 4 (four) times daily. 09/21/22  Yes [provider]  ibuprofen (ADVIL) 800 MG tablet Take 800 mg by mouth every 8 (eight) hours as needed. 09/17/22  Yes [provider]  acetaminophen (TYLENOL) 500 MG tablet Take 1,000 mg by mouth 2 (two) times daily as needed for moderate pain or headache.    [provider]  buPROPion (WELLBUTRIN XL) 150 MG 24 hr tablet Take 1 tablet (150 mg total) by mouth every morning. 07/09/22   Myrlene Broker, MD  cetirizine (ZYRTEC) 10 MG tablet Take 10 mg by mouth daily. Aller-Tec    [provider]  Cholecalciferol (VITAMIN D3) 50 MCG (2000 UT) TABS Take 2,000 Units by mouth daily.     [provider]  diazepam (VALIUM) 10 MG tablet TAKE ONE TABLET BY MOUTH EVERYDAY AT BEDTIME Patient taking differently: Take 10 mg by mouth at bedtime. TAKE ONE TABLET BY  MOUTH EVERYDAY AT BEDTIME 07/09/22   Myrlene Broker, MD  diazepam (VALIUM) 2 MG tablet Take 1 tablet (2 mg total) by mouth daily as needed for anxiety. 07/09/22 07/09/23  Myrlene Broker, MD  estrogens, conjugated, (PREMARIN) 1.25 MG tablet Take 1.25 mg by mouth daily.     [provider]  ezetimibe (ZETIA) 10 MG tablet Take 10 mg by mouth daily. 10/19/19   [provider]  fluticasone (FLONASE) 50 MCG/ACT nasal spray Place 2 sprays into both nostrils daily. 09/03/22   [provider]  furosemide (LASIX) 20 MG tablet Take 1 tablet (20 mg total) by mouth daily. 09/17/22   Gelene Mink, NP  HYDROcodone-acetaminophen (NORCO/VICODIN) 5-325 MG tablet Take 1 tablet by mouth every 8 (eight) hours as needed. Patient not taking: Reported on 09/17/2022 05/19/22   [provider]  hyoscyamine (LEVSIN SL) 0.125 MG SL tablet Place 1 tablet (0.125 mg total) under the tongue every 6 (six) hours as needed for cramping. 09/17/22   Gelene Mink, NP  Lactobacillus-Inulin (CULTURELLE DIGESTIVE HEALTH) CAPS Take 1 capsule by mouth daily. 11/10/21   [provider]  loperamide (IMODIUM) 2 MG capsule Take 2 mg by mouth daily as needed for diarrhea or loose stools.    [provider]  omeprazole (PRILOSEC) 40 MG capsule TAKE 1 CAPSULE BY MOUTH AT BREAKFAST AND AT BEDTIME *REFILL REQUEST* 09/10/22   Gelene Mink, NP  ondansetron (  ZOFRAN) 4 MG tablet TAKE 1 TABLET BY MOUTH EVERY 8 HOURS AS NEEDED FOR NAUSEA & VOMITING 09/15/22   Gelene Mink, NP  propranolol ER (INDERAL LA) 80 MG 24 hr capsule Take 1 capsule (80 mg total) by mouth daily. 07/09/22 07/09/23  Myrlene Broker, MD  spironolactone (ALDACTONE) 50 MG tablet Every Monday, Wed, Friday, with Lasix. Patient taking differently: Take 50 mg by mouth daily. 09/16/22   Gelene Mink, NP  trimethoprim (TRIMPEX) 100 MG tablet Take 1 tablet (100 mg total) by mouth daily. 07/14/22   Donnita Falls, FNP      Allergies    Pramipexole,  Crestor [rosuvastatin calcium], Bee pollen, and Pollen extract    Review of Systems   Review of Systems  Constitutional:  Negative for appetite change and fatigue.  HENT:  Negative for congestion, ear discharge and sinus pressure.   Eyes:  Negative for discharge.  Respiratory:  Negative for cough.   Cardiovascular:  Negative for chest pain.  Gastrointestinal:  Positive for hematochezia. Negative for abdominal pain and diarrhea.       Rectal bleeding  Genitourinary:  Negative for frequency and hematuria.  Musculoskeletal:  Negative for back pain.  Skin:  Negative for rash.  Neurological:  Negative for seizures and headaches.  Psychiatric/Behavioral:  Negative for hallucinations.     Physical Exam Updated Vital Signs BP 95/77   Pulse 60   Temp 98.6 F (37 C) (Oral)   Resp 16   Ht 5\' 6"  (1.676 m)   Wt 62.6 kg   SpO2 100%   BMI 22.27 kg/m  Physical Exam Vitals reviewed.  Constitutional:      Appearance: Normal appearance. She is well-developed.  HENT:     Head: Normocephalic.     Nose: Nose normal.  Eyes:     General: No scleral icterus.    Conjunctiva/sclera: Conjunctivae normal.  Neck:     Thyroid: No thyromegaly.  Cardiovascular:     Rate and Rhythm: Normal rate and regular rhythm.     Heart sounds: No murmur heard.    No friction rub. No gallop.  Pulmonary:     Breath sounds: No stridor. No wheezing or rales.  Chest:     Chest wall: No tenderness.  Abdominal:     General: There is no distension.     Tenderness: There is no abdominal tenderness. There is no rebound.  Genitourinary:    Comments: Heme positive rectal exam, not bloody Musculoskeletal:        General: Normal range of motion.     Cervical back: Neck supple.  Lymphadenopathy:     Cervical: No cervical adenopathy.  Skin:    Findings: No erythema or rash.  Neurological:     Mental Status: She is alert and oriented to person, place, and time.     Motor: No abnormal muscle tone.     Coordination:  Coordination normal.  Psychiatric:        Behavior: Behavior normal.     ED Results / Procedures / Treatments   Labs (all labs ordered are listed, but only abnormal results are displayed) Labs Reviewed  COMPREHENSIVE METABOLIC PANEL - Abnormal; Notable for the following components:      Result Value   Potassium 5.2 (*)    Creatinine, Ser 1.13 (*)    Total Bilirubin 1.4 (*)    GFR, Estimated 52 (*)    All other components within normal limits  CBC - Abnormal; Notable for the following  components:   RDW 15.6 (*)    All other components within normal limits  CBC WITH DIFFERENTIAL/PLATELET - Abnormal; Notable for the following components:   WBC 3.5 (*)    Hemoglobin 11.5 (*)    HCT 34.7 (*)    Platelets 48 (*)    All other components within normal limits  POC OCCULT BLOOD, ED - Abnormal; Notable for the following components:   Fecal Occult Bld POSITIVE (*)    All other components within normal limits  PROTIME-INR  TYPE AND SCREEN    EKG None  Radiology No results found.  Procedures Procedures    Medications Ordered in ED Medications  cefTRIAXone (ROCEPHIN) 2 g in sodium chloride 0.9 % 100 mL IVPB (2 g Intravenous New Bag/Given 09/24/22 1813)  octreotide (SANDOSTATIN) 2 mcg/mL load via infusion 100 mcg (has no administration in time range)    And  octreotide (SANDOSTATIN) 500 mcg in sodium chloride 0.9 % 250 mL (2 mcg/mL) infusion (has no administration in time range)  sodium chloride 0.9 % bolus 1,000 mL (1,000 mLs Intravenous New Bag/Given 09/24/22 1759)  pantoprazole (PROTONIX) injection 40 mg (40 mg Intravenous Given 09/24/22 1758)    ED Course/ Medical Decision Making/ A&P  CRITICAL CARE Performed by: Bethann Berkshire Total critical care time: 45 minutes Critical care time was exclusive of separately billable procedures and treating other patients. Critical care was necessary to treat or prevent imminent or life-threatening deterioration. Critical care was time  spent personally by me on the following activities: development of treatment plan with patient and/or surrogate as well as nursing, discussions with consultants, evaluation of patient's response to treatment, examination of patient, obtaining history from patient or surrogate, ordering and performing treatments and interventions, ordering and review of laboratory studies, ordering and review of radiographic studies, pulse oximetry and re-evaluation of patient's condition.                                Medical Decision Making Amount and/or Complexity of Data Reviewed Labs: ordered.  Risk Prescription drug management. Decision regarding hospitalization.    This patient presents to the ED for concern of rectal bleeding, this involves an extensive number of treatment options, and is a complaint that carries with it a high risk of complications and morbidity.  The differential diagnosis includes bleeding from the colon, possible varices   Co morbidities that complicate the patient evaluation  Cirrhosis   Additional history obtained:  Additional history obtained from patient External records from outside source obtained and reviewed including hospital records   Lab Tests:  I Ordered, and personally interpreted labs.  The pertinent results include: Initial hemoglobin 13 and then dropped to 11.5.  Platelets 48   Imaging Studies ordered:  No imaging  Cardiac Monitoring: / EKG:  The patient was maintained on a cardiac monitor.  I personally viewed and interpreted the cardiac monitored which showed an underlying rhythm of: Normal sinus rhythm   Consultations Obtained:  I requested consultation with the GI and hospitalist,  and discussed lab and imaging findings as well as pertinent plan - they recommend: Hospitalist admit and GI consult tomorrow and possible endoscopy   Problem List / ED Course / Critical interventions / Medication management  Cirrhosis and GI bleed I ordered  medication including octreotide, Protonix Reevaluation of the patient after these medicines showed that the patient stayed the same I have reviewed the patients home medicines and have made  adjustments as needed   Social Determinants of Health:  None   Test / Admission - Considered:  None   GI bleed with history of cirrhosis.  Patient admitted to medicine and GI consult tomorrow         Final Clinical Impression(s) / ED Diagnoses Final diagnoses:  Rectal bleeding    Rx / DC Orders ED Discharge Orders     None         Bethann Berkshire, MD 09/25/22 1037

## 2022-09-25 ENCOUNTER — Inpatient Hospital Stay (HOSPITAL_COMMUNITY): Payer: 59 | Admitting: Certified Registered"

## 2022-09-25 ENCOUNTER — Encounter (HOSPITAL_COMMUNITY)
Admission: EM | Disposition: A | Discharge status: Home / Self Care | Payer: Self-pay | Source: Home / Self Care | Attending: Internal Medicine

## 2022-09-25 DIAGNOSIS — K625 Hemorrhage of anus and rectum: Principal | ICD-10-CM

## 2022-09-25 DIAGNOSIS — K766 Portal hypertension: Secondary | ICD-10-CM | POA: Diagnosis not present

## 2022-09-25 DIAGNOSIS — D124 Benign neoplasm of descending colon: Secondary | ICD-10-CM | POA: Diagnosis not present

## 2022-09-25 DIAGNOSIS — D62 Acute posthemorrhagic anemia: Secondary | ICD-10-CM | POA: Diagnosis not present

## 2022-09-25 DIAGNOSIS — K644 Residual hemorrhoidal skin tags: Secondary | ICD-10-CM | POA: Diagnosis not present

## 2022-09-25 DIAGNOSIS — K635 Polyp of colon: Secondary | ICD-10-CM | POA: Diagnosis not present

## 2022-09-25 DIAGNOSIS — D696 Thrombocytopenia, unspecified: Secondary | ICD-10-CM | POA: Diagnosis not present

## 2022-09-25 DIAGNOSIS — K219 Gastro-esophageal reflux disease without esophagitis: Secondary | ICD-10-CM | POA: Diagnosis not present

## 2022-09-25 DIAGNOSIS — E119 Type 2 diabetes mellitus without complications: Secondary | ICD-10-CM | POA: Diagnosis not present

## 2022-09-25 DIAGNOSIS — K921 Melena: Secondary | ICD-10-CM | POA: Diagnosis not present

## 2022-09-25 DIAGNOSIS — D122 Benign neoplasm of ascending colon: Secondary | ICD-10-CM | POA: Diagnosis not present

## 2022-09-25 DIAGNOSIS — K3189 Other diseases of stomach and duodenum: Secondary | ICD-10-CM | POA: Diagnosis not present

## 2022-09-25 DIAGNOSIS — G8929 Other chronic pain: Secondary | ICD-10-CM | POA: Diagnosis present

## 2022-09-25 DIAGNOSIS — K529 Noninfective gastroenteritis and colitis, unspecified: Secondary | ICD-10-CM | POA: Diagnosis not present

## 2022-09-25 DIAGNOSIS — Z85828 Personal history of other malignant neoplasm of skin: Secondary | ICD-10-CM | POA: Diagnosis not present

## 2022-09-25 DIAGNOSIS — K648 Other hemorrhoids: Secondary | ICD-10-CM | POA: Diagnosis not present

## 2022-09-25 DIAGNOSIS — F17210 Nicotine dependence, cigarettes, uncomplicated: Secondary | ICD-10-CM | POA: Diagnosis present

## 2022-09-25 DIAGNOSIS — I8500 Esophageal varices without bleeding: Secondary | ICD-10-CM | POA: Diagnosis not present

## 2022-09-25 DIAGNOSIS — K580 Irritable bowel syndrome with diarrhea: Secondary | ICD-10-CM | POA: Diagnosis present

## 2022-09-25 DIAGNOSIS — F319 Bipolar disorder, unspecified: Secondary | ICD-10-CM | POA: Diagnosis present

## 2022-09-25 DIAGNOSIS — E782 Mixed hyperlipidemia: Secondary | ICD-10-CM

## 2022-09-25 DIAGNOSIS — K7581 Nonalcoholic steatohepatitis (NASH): Secondary | ICD-10-CM | POA: Diagnosis not present

## 2022-09-25 DIAGNOSIS — Z8249 Family history of ischemic heart disease and other diseases of the circulatory system: Secondary | ICD-10-CM | POA: Diagnosis not present

## 2022-09-25 DIAGNOSIS — K7460 Unspecified cirrhosis of liver: Secondary | ICD-10-CM | POA: Diagnosis not present

## 2022-09-25 DIAGNOSIS — K7469 Other cirrhosis of liver: Secondary | ICD-10-CM | POA: Diagnosis not present

## 2022-09-25 DIAGNOSIS — K047 Periapical abscess without sinus: Secondary | ICD-10-CM | POA: Diagnosis present

## 2022-09-25 DIAGNOSIS — D123 Benign neoplasm of transverse colon: Secondary | ICD-10-CM | POA: Diagnosis not present

## 2022-09-25 DIAGNOSIS — E875 Hyperkalemia: Secondary | ICD-10-CM | POA: Diagnosis not present

## 2022-09-25 DIAGNOSIS — I8510 Secondary esophageal varices without bleeding: Secondary | ICD-10-CM | POA: Diagnosis not present

## 2022-09-25 DIAGNOSIS — J45909 Unspecified asthma, uncomplicated: Secondary | ICD-10-CM | POA: Diagnosis not present

## 2022-09-25 DIAGNOSIS — Z79899 Other long term (current) drug therapy: Secondary | ICD-10-CM | POA: Diagnosis not present

## 2022-09-25 HISTORY — PX: ESOPHAGOGASTRODUODENOSCOPY (EGD) WITH PROPOFOL: SHX5813

## 2022-09-25 LAB — COMPREHENSIVE METABOLIC PANEL
ALT: 14 U/L (ref 0–44)
AST: 21 U/L (ref 15–41)
Albumin: 3.3 g/dL — ABNORMAL LOW (ref 3.5–5.0)
Alkaline Phosphatase: 81 U/L (ref 38–126)
Anion gap: 9 (ref 5–15)
BUN: 15 mg/dL (ref 8–23)
CO2: 20 mmol/L — ABNORMAL LOW (ref 22–32)
Calcium: 8.2 mg/dL — ABNORMAL LOW (ref 8.9–10.3)
Chloride: 107 mmol/L (ref 98–111)
Creatinine, Ser: 1.01 mg/dL — ABNORMAL HIGH (ref 0.44–1.00)
GFR, Estimated: 60 mL/min — ABNORMAL LOW (ref >60)
Glucose, Bld: 102 mg/dL — ABNORMAL HIGH (ref 70–99)
Potassium: 4.5 mmol/L (ref 3.5–5.1)
Sodium: 136 mmol/L (ref 135–145)
Total Bilirubin: 1 mg/dL (ref 0.3–1.2)
Total Protein: 6 g/dL — ABNORMAL LOW (ref 6.5–8.1)

## 2022-09-25 LAB — CBC
HCT: 32.2 % — ABNORMAL LOW (ref 36.0–46.0)
Hemoglobin: 10.5 g/dL — ABNORMAL LOW (ref 12.0–15.0)
MCH: 29.6 pg (ref 26.0–34.0)
MCHC: 32.6 g/dL (ref 30.0–36.0)
MCV: 90.7 fL (ref 80.0–100.0)
Platelets: 43 10*3/uL — ABNORMAL LOW (ref 150–400)
RBC: 3.55 MIL/uL — ABNORMAL LOW (ref 3.87–5.11)
RDW: 15.1 % (ref 11.5–15.5)
WBC: 2.4 10*3/uL — ABNORMAL LOW (ref 4.0–10.5)
nRBC: 0 % (ref 0.0–0.2)

## 2022-09-25 LAB — PROTIME-INR
INR: 1.3 — ABNORMAL HIGH (ref 0.8–1.2)
Prothrombin Time: 16.2 s — ABNORMAL HIGH (ref 11.4–15.2)

## 2022-09-25 SURGERY — ESOPHAGOGASTRODUODENOSCOPY (EGD) WITH PROPOFOL
Anesthesia: General

## 2022-09-25 MED ORDER — SODIUM CHLORIDE 0.9 % IV SOLN: INTRAVENOUS | Status: DC | PRN

## 2022-09-25 MED ORDER — STERILE WATER FOR IRRIGATION IR SOLN
Status: DC | PRN
  Administered 2022-09-25: 60 mL

## 2022-09-25 MED ORDER — LACTATED RINGERS IV SOLN
INTRAVENOUS | Status: DC | PRN
Start: 2022-09-25 — End: 2022-09-25

## 2022-09-25 MED ORDER — PROPOFOL 10 MG/ML IV BOLUS
INTRAVENOUS | Status: DC | PRN
  Administered 2022-09-25: 200 mg via INTRAVENOUS

## 2022-09-25 MED ORDER — SODIUM CHLORIDE 0.9% IV SOLUTION: Freq: Once | INTRAVENOUS | Status: DC

## 2022-09-25 MED ORDER — PANCRELIPASE (LIP-PROT-AMYL) 36000-114000 UNITS PO CPEP
36000.0000 [IU] | ORAL_CAPSULE | Freq: Every day | ORAL | Status: DC
  Administered 2022-09-26: 36000 [IU] via ORAL
  Filled 2022-09-25 (×2): qty 1

## 2022-09-25 MED ORDER — PANTOPRAZOLE SODIUM 40 MG PO TBEC
40.0000 mg | DELAYED_RELEASE_TABLET | Freq: Every day | ORAL | Status: DC
  Administered 2022-09-26: 40 mg via ORAL
  Filled 2022-09-25: qty 1

## 2022-09-25 MED ORDER — PEG 3350-KCL-NA BICARB-NACL 420 G PO SOLR
4000.0000 mL | Freq: Once | ORAL | Status: AC
  Administered 2022-09-25: 4000 mL via ORAL

## 2022-09-25 NOTE — Progress Notes (Signed)
Progress Note   Patient: Andrea Dickson HQI:[id number] DOB: [birth year] DOA: 09/24/2022     0 DOS: the patient was seen and examined on 09/25/2022   Brief hospital admission narrative: As per H&P written by Dr. Randol Kern 09/24/2022  Andrea Dickson  is a 72 y.o. female, with past medical history of [name] cirrhosis, chronic diarrhea, chronic nausea, hyperlipidemia, essential tremors, bipolar disorder, Beatties mellitus not on medications anymore, given controlled blood glucose. -Patient presents to ED secondary to complaints of bright red blood per rectum, reported developed this morning, she reports as fresh blood, for couple bowel movement, followed by dark-colored stool, she denies any vomiting, coffee-ground emesis, constipation, she reports chronic diarrhea at baseline, fourth generalized weakness, fatigue, she came to ED for further evaluation, she denies being on any blood thinners. -In ED she will hemoglobin 13, did drop to 11.5, platelet low at 48K, which is around her baseline, potassium was elevated at 5.2, total bilirubin elevated at 1.4, but other LFTs within normal limit, albumin normal at 4.1, [facility] hospitalist consulted to admit.  Assessment and Plan: 1-acute blood loss anemia/GI bleed -Unclear source at the moment -Patient reporting rectal bleeding and melena -Stable hemoglobin and BUN 17 -No transfusion currently needed -Case has been discussed with gastroenterology service and the plan is for endoscopy and colonoscopy during this hospitalization -Continue to follow hemoglobin trend -Continue treatment with octreotide and IV PPI -Patient with underlying history of liver cirrhosis due to NASH and prior history of esophageal varices. -Continue n.p.o. status.  2-liver cirrhosis due to NASH -Mild hyperbilirubinemia -Soft blood pressure on complaints of orthostasis -Holding Lasix and Aldactone at the moment -Follow GI service recommendations for resumption of  propranolol -Continue PPI.  3-SBP prophylaxis -No significant ascites appreciated -Continue Rocephin  4-Thrombocytopenia -1 unit platelet will be transfused to further stabilize platelet count in anticipation for endoscopic procedures -Repeat CBC in AM. -Associated with cirrhosis.  5-type 2 diabetes -No longer taking medication -Diet control only -Follow CBGs intermittently.  6-history of bipolar disorder -Continue Wellbutrin and as needed Valium -No suicidal ideation or hallucinations.  7-hyperkalemia -Mild at time of presentation -Continue holding Aldactone -Follow electrolytes trend.  8-chronic diarrhea -Continue pancrelipase -Follow GI service recommendations.  9-hyperlipidemia -Continue the use of Zetia  Subjective:  Reporting seeing some blood in her stools; no abdominal pain, still intermittent episodes of diarrhea.  Feeling slightly hungry.  No fever, no chest pain, no shortness of breath.  Physical Exam: Vitals:   09/25/22 1515 09/25/22 1530 09/25/22 1545 09/25/22 1625  BP:      Pulse: (!) 59 (!) 58 (!) 59 68  Resp: 17 18 15 18   Temp:      TempSrc:      SpO2: 99% 99% 99% 93%  Weight:      Height:       General exam: In no acute distress; denies chest pain, shortness of breath, nausea or vomiting. Respiratory system: Clear to auscultation. Respiratory effort normal.  Good saturation on room air. Cardiovascular system:RRR. No rubs or gallops; no JVD. Gastrointestinal system: Abdomen is nondistended, soft and with positive bowel sounds. Central nervous system: Alert and oriented. No focal neurological deficits. Extremities: No cyanosis, clubbing or edema. Skin: No petechiae. Psychiatry: Flat affect appreciated.  Data Reviewed: CBC: Blood cells 2.4, he platelet count 43 Comprehensive metabolic panel: Sodium 136, potassium 4.5, chloride 107, bicarb 20, BUN 15, creatinine 1.01, normal LFTs and bilirubin 1.0  Family Communication: No family at  bedside.  Disposition: Status is: Inpatient Remains  inpatient appropriate because: Continue to follow hemoglobin trend, continue PPI infusion and octreotide; follow GI service for endoscopic evaluation.   Planned Discharge Destination: Home    Time spent: 45 minutes  Author: Vassie Loll, MD 09/25/2022 4:45 PM  For on call review [email or web address].

## 2022-09-25 NOTE — Brief Op Note (Signed)
09/25/2022  4:39 PM  PATIENT:  Andrea Dickson  72 y.o. female  PRE-OPERATIVE DIAGNOSIS:  melena, anemia  POST-OPERATIVE DIAGNOSIS:  portal hypertensive gastropathy, esophageal varices grade 3  PROCEDURE:  Procedure(s): ESOPHAGOGASTRODUODENOSCOPY (EGD) WITH PROPOFOL (N/A)  SURGEON:  Surgeons and Role:    * Dolores Frame, MD - Primary  Patient underwent colonoscopy under propofol sedation.  Tolerated the procedure adequately.   FINDINGS: - Grade III esophageal varices.  - Portal hypertensive gastropathy.  - Normal examined duodenum.   RECOMMENDATIONS - Return patient to hospital ward for ongoing care.  - Clear liquid diet today. NPO after MN. - Proceed with colonoscopy tomorrow. - Transfuse 1 U of platelets today and check CBC at 5 AM. - Restart propranolol 80 mg daily after colonoscopy.  Katrinka Blazing, MD Gastroenterology and Hepatology [facility] Gastroenterology

## 2022-09-25 NOTE — Op Note (Signed)
[facility] Patient Name: Andrea Dickson Procedure Date: 09/25/2022 3:54 PM MRN: [id number] Date of Birth: 24-Jan-1950 Attending MD: Katrinka Blazing , , [id number] CSN: [id number] Age: 72 Admit Type: Inpatient Procedure:                Upper GI endoscopy Indications:              Hematochezia Providers:                Katrinka Blazing, Angelica Ran, Elinor Parkinson Referring MD:              Medicines:                Monitored Anesthesia Care Complications:            No immediate complications. Estimated Blood Loss:     Estimated blood loss: none. Procedure:                Pre-Anesthesia Assessment:                           - Prior to the procedure, a History and Physical                            was performed, and patient medications, allergies                            and sensitivities were reviewed. The patient's                            tolerance of previous anesthesia was reviewed.                           - The risks and benefits of the procedure and the                            sedation options and risks were discussed with the                            patient. All questions were answered and informed                            consent was obtained.                           - After reviewing the risks and benefits, the                            patient was deemed in satisfactory condition to                            undergo the procedure.                           - ASA Grade Assessment: III - A patient with severe                            systemic disease.  After obtaining informed consent, the endoscope was                            passed under direct vision. Throughout the                            procedure, the patient's blood pressure, pulse, and                            oxygen saturations were monitored continuously. The                            [id number]-[id number] ([id number]) scope was introduced through the                             mouth, and advanced to the second part of duodenum.                            The upper GI endoscopy was accomplished without                            difficulty. The patient tolerated the procedure                            well. Scope In: 4:28:23 PM Scope Out: 4:32:56 PM Total Procedure Duration: 0 hours 4 minutes 33 seconds  Findings:      Grade III varices were found in the lower third of the esophagus. No red       wale signs or changes concerning for recent bleeding were seen.      Mild portal hypertensive gastropathy was found in the entire examined       stomach.      The examined duodenum was normal. Impression:               - Grade III esophageal varices.                           - Portal hypertensive gastropathy.                           - Normal examined duodenum.                           - No specimens collected. Moderate Sedation:      Per Anesthesia Care Recommendation:           - Return patient to hospital ward for ongoing care.                           - Clear liquid diet today. NPO after MN.                           - Proceed with colonoscopy tomorrow.                           - Transfuse 1 U of platelets today and check CBC  at                            5 AM.                           - Restart propranolol 80 mg daily after colonoscopy. Procedure Code(s):        --- Professional ---                           [phone], Esophagogastroduodenoscopy, flexible,                            transoral; diagnostic, including collection of                            specimen(s) by brushing or washing, when performed                            (separate procedure) Diagnosis Code(s):        --- Professional ---                           I85.00, Esophageal varices without bleeding                           K76.6, Portal hypertension                           K31.89, Other diseases of stomach and duodenum                           K92.1, Melena (includes  Hematochezia) CPT copyright 2022 [employer]. All rights reserved. The codes documented in this report are preliminary and upon coder review may  be revised to meet current compliance requirements. Katrinka Blazing, MD Katrinka Blazing,  09/25/2022 4:40:29 PM This report has been signed electronically. Number of Addenda: 0

## 2022-09-25 NOTE — Consult Note (Signed)
Gastroenterology Consult   Referring Provider: No ref. provider found Primary Care Physician:  Juliette Alcide, MD Primary Gastroenterologist:  Dr. Jena Gauss  Patient ID: Andrea Dickson; [id number]; 02-19-1950   Admit date: 09/24/2022  LOS: 0 days   Date of Consultation: 09/25/2022  Reason for Consultation:  rectal bleeding, melena, anemia  History of Present Illness   Andrea Dickson is a 72 y.o. year old female with history of [name] cirrhosis, chronic diarrhea, chronic nausea, HLD, essential tremors, bipolar, and diabetes controlled without medication who presented to the ED with complaint of bright red blood per rectum yesterday morning.  After having couple episodes of bright red blood she reported some dark-colored stools as well.  GI consulted for further evaluation -of rectal bleeding as well as decline in hemoglobin.  ED Course: Vital signs stable, low diastolic blood pressures.  Had some mild hypotension overnight with systolics in the 90s and diastolics in the 40-50s.  Labs with potassium 5.2, creatinine 1.13, T. bili 1.4, GFR 52.  Normal LFTs.  Hemoglobin 13, platelets unable to be counted.  INR 1.2 Stool heme positive. Recheck 3 hours later with hemoglobin down to 11.5 and platelets 48 Overnight hemoglobin dropped to 11.1   Consult: Has had multiple episodes of rectal bleeding since yesterday morning. Has been a mixture of dark stools and bright red in her stools in her.Still having her chronic crampy abdominal pain. Take levsin to help with cramping. Sometimes pain goes up her left side. At home on average she is having 2 BM daily. She is eating less given she has bene trying to avoid dairy products, red meat, fatty/greasy foods. Has been eating jello and crackers and has lost 20-30 pounds. Has bene taking her pancreatic enzymes. She reports she does not eat chicken and does not consume any nutritional shakes. She states she has never liked chicken the majority of her  adulthood. Does not like [place] either. No N/V at home. Some intermittent abdominal distention but compliant with diuretics. No other bleeding episodes. Denies any confusion or mental status changes. No falls as home. Appetite is good but has fear of eating many different things given her diarrhea.  Denies NSAID use or alcohol use.   CTA/BRTO 8/6: -Cirrhosis with portal hypertension including ascites and splenomegaly -Moderate stool burden -Decompressed gallbladder -Recannulization of umbilical portal vein, splenic vein, SMV, and inferior mesenteric vein remain patent -Mild to moderate atherosclerotic changes of the origin of the SMA with atheromatous plaque without high-grade stenosis -Patent celiac Next due in Feb 2025.   Last AFP: May 2024 3.0 Check in fall 2024.    EGD 01/19/2022: -3 columns of grade 2/limited grade 3 varices -Portal hypertensive gastropathy -Friable gastric mucosa -Continue Inderal daily -No repeat EGD needed as long as no evidence of upper GI bleed   Colonoscopy Jan 2020: colonic lipoma, tubular adenomas, random colonic biopsies negative.   Past Medical History:  Diagnosis Date   Anxiety    Asthma    Bipolar 1 disorder (HCC)    Chronic diarrhea    Cirrhosis (HCC)    Diagnosed September 2020, likely secondary to [facility], s/p Hep A/B vaccination in 2021.    Depression    Diabetes (HCC)    Essential tremor    GERD (gastroesophageal reflux disease)    Headache    High cholesterol     Past Surgical History:  Procedure Laterality Date   ABDOMINAL HYSTERECTOMY  1997   BIOPSY  01/17/2018   Procedure: BIOPSY;  Surgeon:  Corbin Ade, MD;  [facility];  Service: Endoscopy;;  colon   carpal tunnel Bilateral 1999, 06/2009   COLONOSCOPY  2013   Dr. Teena Dunk: no colon polyps. quality of prep was fair. repeat colonoscopy in five years.    COLONOSCOPY WITH PROPOFOL N/A 01/17/2018   Dr. Jena Gauss: Colonic lipoma, 2 tubular adenomas removed.  Random colon biopsies  negative.  Next colonoscopy 5 years.   ESOPHAGOGASTRODUODENOSCOPY (EGD) WITH PROPOFOL N/A 04/06/2019   Procedure: ESOPHAGOGASTRODUODENOSCOPY (EGD) WITH PROPOFOL;  Surgeon: Corbin Ade, MD;   Normal esophagus, mild portal hypertensive gastropathy, otherwise normal exam.  Recommend repeat EGD in 2 years for variceal screening.    ESOPHAGOGASTRODUODENOSCOPY (EGD) WITH PROPOFOL N/A 01/19/2022   Procedure: ESOPHAGOGASTRODUODENOSCOPY (EGD) WITH PROPOFOL;  Surgeon: Corbin Ade, MD;  [facility];  Service: Endoscopy;  Laterality: N/A;  12:45 pm, pt knows to arrive at 9:15   POLYPECTOMY  01/17/2018   Procedure: POLYPECTOMY;  Surgeon: Corbin Ade, MD;  [facility];  Service: Endoscopy;;  colon   SKIN CANCER EXCISION  2007   TONSILLECTOMY  1965   ulner nerve  Left 06/2009   decompression    Prior to Admission medications   Medication Sig Start Date End Date Taking? Authorizing Provider  acetaminophen (TYLENOL) 500 MG tablet Take 1,000 mg by mouth 2 (two) times daily as needed for moderate pain or headache.   Yes [provider]  amoxicillin (AMOXIL) 500 MG capsule Take 500 mg by mouth 4 (four) times daily. 09/21/22  Yes [provider]  buPROPion (WELLBUTRIN XL) 150 MG 24 hr tablet Take 1 tablet (150 mg total) by mouth every morning. 07/09/22  Yes Myrlene Broker, MD  cetirizine (ZYRTEC) 10 MG tablet Take 10 mg by mouth daily. Aller-Tec   Yes [provider]  Cholecalciferol (VITAMIN D3) 50 MCG (2000 UT) TABS Take 2,000 Units by mouth daily.    Yes [provider]  diazepam (VALIUM) 2 MG tablet Take 1 tablet (2 mg total) by mouth daily as needed for anxiety. 07/09/22 07/09/23 Yes Myrlene Broker, MD  estrogens, conjugated, (PREMARIN) 1.25 MG tablet Take 1.25 mg by mouth daily.    Yes [provider]  ezetimibe (ZETIA) 10 MG tablet Take 10 mg by mouth daily. 10/19/19  Yes [provider]  fluticasone (FLONASE) 50 MCG/ACT nasal  spray Place 2 sprays into both nostrils daily. 09/03/22  Yes [provider]  furosemide (LASIX) 20 MG tablet Take 1 tablet (20 mg total) by mouth daily. 09/17/22  Yes Gelene Mink, NP  hyoscyamine (LEVSIN SL) 0.125 MG SL tablet Place 1 tablet (0.125 mg total) under the tongue every 6 (six) hours as needed for cramping. 09/17/22  Yes Gelene Mink, NP  ibuprofen (ADVIL) 800 MG tablet Take 800 mg by mouth every 8 (eight) hours as needed. 09/17/22  Yes [provider]  Lactobacillus-Inulin (CULTURELLE DIGESTIVE HEALTH) CAPS Take 1 capsule by mouth daily. 11/10/21  Yes [provider]  loperamide (IMODIUM) 2 MG capsule Take 2 mg by mouth daily as needed for diarrhea or loose stools.   Yes [provider]  omeprazole (PRILOSEC) 40 MG capsule TAKE 1 CAPSULE BY MOUTH AT BREAKFAST AND AT BEDTIME *REFILL REQUEST* 09/10/22  Yes Gelene Mink, NP  ondansetron (ZOFRAN) 4 MG tablet TAKE 1 TABLET BY MOUTH EVERY 8 HOURS AS NEEDED FOR NAUSEA & VOMITING 09/15/22  Yes Gelene Mink, NP  Pancrelipase, Lip-Prot-Amyl, (ZENPEP) 40000-126000 units  CPEP Take 1 capsule by mouth daily.   Yes [provider]  propranolol ER (INDERAL LA) 80 MG 24 hr capsule Take 1 capsule (80 mg total) by mouth daily. 07/09/22 07/09/23 Yes Myrlene Broker, MD  spironolactone (ALDACTONE) 50 MG tablet Every Monday, Wed, Friday, with Lasix. Patient taking differently: Take 50 mg by mouth daily. 09/16/22  Yes Gelene Mink, NP  trimethoprim (TRIMPEX) 100 MG tablet Take 1 tablet (100 mg total) by mouth daily. 07/14/22  Yes Donnita Falls, FNP    Current Facility-Administered Medications  Medication Dose Route Frequency Provider Last Rate Last Admin   albuterol (PROVENTIL) (2.5 MG/3ML) 0.083% nebulizer solution 2.5 mg  2.5 mg Nebulization Q2H PRN Elgergawy, Leana Roe, MD       buPROPion (WELLBUTRIN XL) 24 hr tablet 150 mg  150 mg Oral q morning Elgergawy, Leana Roe, MD       cefTRIAXone (ROCEPHIN) 2 g in sodium  chloride 0.9 % 100 mL IVPB  2 g Intravenous Q24H Elgergawy, Leana Roe, MD       diazepam (VALIUM) tablet 2 mg  2 mg Oral Daily PRN Elgergawy, Leana Roe, MD       nicotine (NICODERM CQ - dosed in mg/24 hours) patch 14 mg  14 mg Transdermal Daily Elgergawy, Leana Roe, MD       octreotide (SANDOSTATIN) 500 mcg in sodium chloride 0.9 % 250 mL (2 mcg/mL) infusion  50 mcg/hr Intravenous Continuous Elgergawy, Leana Roe, MD 25 mL/hr at 09/24/22 1900 50 mcg/hr at 09/24/22 1900   ondansetron (ZOFRAN) tablet 4 mg  4 mg Oral Q6H PRN Elgergawy, Leana Roe, MD       Or   ondansetron (ZOFRAN) injection 4 mg  4 mg Intravenous Q6H PRN Elgergawy, Leana Roe, MD       Pancrelipase (Lip-Prot-Amyl) 40000-126000 units CPEP 40,000 Units  1 capsule Oral Daily Elgergawy, Leana Roe, MD       [START ON 09/28/2022] pantoprazole (PROTONIX) injection 40 mg  40 mg Intravenous Q12H Elgergawy, Leana Roe, MD       pantoprozole (PROTONIX) 80 mg /NS 100 mL infusion  8 mg/hr Intravenous Continuous Elgergawy, Leana Roe, MD 10 mL/hr at 09/24/22 1936 8 mg/hr at 09/24/22 1936   polyethylene glycol-electrolytes (NuLYTELY) solution 4,000 mL  4,000 mL Oral Once Marguerita Merles, Reuel Boom, MD       propranolol ER (INDERAL LA) 24 hr capsule 80 mg  80 mg Oral Daily Elgergawy, Leana Roe, MD        Allergies as of 09/24/2022 - Review Complete 09/24/2022  Allergen Reaction Noted   Pramipexole Other (See Comments) 04/24/2015   Crestor [rosuvastatin calcium]  03/28/2019   Bee pollen Other (See Comments) 04/24/2015   Pollen extract Other (See Comments) 04/24/2015    Family History  Problem Relation Age of Onset   Diabetes Mother    Brain cancer Mother    Diabetes Father    Heart attack Father    Heart disease Father    Diabetes Sister    Bipolar disorder Brother    Diabetes Brother    Heart disease Brother    Colon cancer Neg Hx    Breast cancer Neg Hx     Social History   Socioeconomic History   Marital status: Divorced    Spouse name: Not  on file   Number of children: 1   Years of education: 12   Highest education level: Not on file  Occupational History   Occupation: retired  Comment: employed at [employer]  Tobacco Use   Smoking status: Every Day    Current packs/day: 0.50    Average packs/day: 0.5 packs/day for 43.0 years (21.5 ttl pk-yrs)    Types: Cigarettes   Smokeless tobacco: Never   Tobacco comments:    smoking since 72yrs old.    Vaping Use   Vaping status: Never Used  Substance and Sexual Activity   Alcohol use: No    Alcohol/week: 0.0 standard drinks of alcohol   Drug use: No   Sexual activity: Not Currently  Other Topics Concern   Not on file  Social History Narrative   Lives alone.  Retired.  Education 12th grade.  One child.     Caffeine use- sodas, 2 daily   Social Determinants of Health   Financial Resource Strain: Low Risk  (11/10/2021)   Received from [facility], [facility]   Overall Financial Resource Strain (CARDIA)    Difficulty of Paying Living Expenses: Not hard at all  Food Insecurity: No Food Insecurity (11/10/2021)   Received from [facility], [facility]   Hunger Vital Sign    Worried About Running Out of Food in the Last Year: Never true    Ran Out of Food in the Last Year: Never true  Transportation Needs: No Transportation Needs (11/10/2021)   Received from [facility], [facility]   [facility] - Transportation    Lack of Transportation (Medical): No    Lack of Transportation (Non-Medical): No  Physical Activity: Inactive (11/10/2021)   Received from [facility], [facility]   Exercise Vital Sign    Days of Exercise per Week: 0 days    Minutes of Exercise per Session: 0 min  Stress: No Stress Concern Present (11/10/2021)   Received from [facility], [facility] - Occupational Stress Questionnaire    Feeling of Stress : Not at all  Social Connections: Socially Isolated (11/10/2021)    Received from [facility], [facility]   Social Connection and Isolation Panel [NHANES]    Frequency of Communication with Friends and Family: More than three times a week    Frequency of Social Gatherings with Friends and Family: Once a week    Attends Religious Services: Never    Database administrator or Organizations: No    Attends Banker Meetings: Never    Marital Status: Divorced  Catering manager Violence: Not At Risk (11/10/2021)   Received from [facility], [facility]   Humiliation, Afraid, Rape, and Kick questionnaire    Fear of Current or Ex-Partner: No    Emotionally Abused: No    Physically Abused: No    Sexually Abused: No     Review of Systems   Gen: + weight loss. Denies any fever, chills, loss of appetite CV: Denies chest pain, heart palpitations, syncope, edema  Resp: Denies shortness of breath with rest, cough, wheezing, coughing up blood, and pleurisy. GI: see HPI GU : Denies urinary burning, blood in urine, urinary frequency, and urinary incontinence. MS: Denies joint pain, limitation of movement, swelling, cramps, and atrophy.  Derm: Denies rash, itching, dry skin, hives. Psych: Denies depression, anxiety, memory loss, hallucinations, and confusion. Heme: Denies bruising or bleeding Neuro:  Denies any headaches, dizziness, paresthesias, shaking  Physical Exam   Vital Signs in last 24 hours: Temp:  [98 F (36.7 C)-98.6 F (37 C)]  98.1 F (36.7 C) (09/13 0342) Pulse Rate:  [56-66] 56 (09/13 0342) Resp:  [14-25] 18 (09/13 0342) BP: (91-126)/(46-77) 103/55 (09/13 0342) SpO2:  [95 %-100 %] 98 % (09/13 0342) Weight:  [62.6 kg] 62.6 kg (09/12 2319) Last BM Date : 09/24/22  General:   Alert, pleasant and cooperative in NAD Head:  Normocephalic and atraumatic. Eyes:  Sclera clear, no icterus.   Conjunctiva pink. Ears:  Normal auditory acuity. Mouth:  No deformity or lesions, dentition normal. Neck:  Supple; no  masses Lungs:  Clear throughout to auscultation. No wheezes, crackles, or rhonchi. No acute distress. Heart:  Regular rate and rhythm; no murmurs, clicks, rubs, or gallops. Abdomen:  Soft, nontender and nondistended. No masses, hepatosplenomegaly or hernias noted. Normal bowel sounds, without guarding, and without rebound.   Rectal: deferred   Msk:  Symmetrical without gross deformities. Normal posture. Extremities:  Without clubbing or edema. Neurologic:  Alert and  oriented x4. Skin:  Intact without significant lesions or rashes. Psych:  Alert and cooperative. Normal mood and affect.  Intake/Output from previous day: 09/12 0701 - 09/13 0700 In: 0.1 [I.V.:0.1] Out: -  Intake/Output this shift: Total I/O In: 400 [P.O.:400] Out: -   Labs/Studies   Recent Labs Recent Labs    09/24/22 1627 09/24/22 2236 09/25/22 0547  WBC 3.5* 3.0* 2.4*  HGB 11.5* 11.1* 10.5*  HCT 34.7* 32.9* 32.2*  PLT 48* 45* 43*   BMET Recent Labs    09/24/22 1357 09/25/22 0547  NA 137 136  K 5.2* 4.5  CL 106 107  CO2 23 20*  GLUCOSE 83 102*  BUN 17 15  CREATININE 1.13* 1.01*  CALCIUM 9.2 8.2*   LFT Recent Labs    09/24/22 1357 09/25/22 0547  PROT 7.9 6.0*  ALBUMIN 4.1 3.3*  AST 23 21  ALT 18 14  ALKPHOS 104 81  BILITOT 1.4* 1.0   PT/INR Recent Labs    09/24/22 1357 09/25/22 0823  LABPROT 15.2 16.2*  INR 1.2 1.3*   Hepatitis Panel No results for input(s): "HEPBSAG", "HCVAB", "HEPAIGM", "HEPBIGM" in the last 72 hours. C-Diff No results for input(s): "CDIFFTOX" in the last 72 hours.  Radiology/Studies No results found.  Assessment   Andrea Dickson is a 72 y.o. year old female   MASH Cirrhosis: History of cirrhosis, previously well compensated however did have paracentesis x 2 earlier this year (May and June).  Currently well-controlled on Lasix 20 mg and spironolactone 50 mg daily.  EGD performed in January 2024 with 3 columns of grade 2/limited grade 3 varices, portal  hypertensive gastropathy, and friable gastric mucosa  and is currently maintained on enteral daily.  She has chronic thrombocytopenia as well.  Previous Doppler ultrasound without PVT but was unable to visualize splenic vein.  Recent CT angio with BRTO protocol with splenomegaly but with patent splenic vein.  She has reported a 20 to 30 pound weight loss over several months given she is only eating crackers and Jell-O as she has cut out a lot from her diet given her diarrhea.  We discussed the importance of good protein intake in regards to her liver disease.  I have encouraged her to protein shakes daily going forward and that we may need to consider nutrition consult.  MELD 3.0: 11  Anemia, Rectal bleeding, melena, diarrhea: Presented with a few episodes of rectal bleeding yesterday morning.  Started with bright red blood followed by some darker stools.  She has a history of chronic intermittent diarrhea  for which she takes Imodium and hyoscyamine for.  Hemoglobin normal at 13 on admission >> 11.5>> 11.1>> 10.5 today.  Platelets low at 43.  Has chronic abdominal pain but nothing new or worsening.  Last colonoscopy in January 2020 with lipoma and tubular adenomas removed and random colonic biopsies negative for microscopic colitis.  Differentials remain broad however in the setting of her liver disease with known prior esophageal varices we will perform EGD to assess for variceal bleeding, gastritis, bleeding PHG.  Will plan to perform this today and then will give clear liquid diet and prep for colonoscopy tomorrow.  Currently on Rocephin until GI bleed ruled out as well as octreotide and PPI infusion.  Advise that we will need to hold Imodium and Levsin given we need to prep for colonoscopy.  Has baseline IBS with diarrhea.  Plan / Recommendations   Trend H/H, transfuse for hgb <7 Continue rocephin daily until GI bleed ruled out Continue octreotide infusion Continue pancreatic enzymes Continue  propranolol PPI infusion Daily protein shakes recommended outpatient and possibly nutrition consult outpatient.  Hold imodium EGD today with Dr. Levon Hedger Clear liquids after EGD and prep for colonoscopy tomorrow.    09/25/2022, 9:11 AM  Brooke Bonito, MSN, FNP-BC, AGACNP-BC [facility]

## 2022-09-25 NOTE — Transfer of Care (Signed)
Immediate Anesthesia Transfer of Care Note  Patient: Andrea Dickson  Procedure(s) Performed: ESOPHAGOGASTRODUODENOSCOPY (EGD) WITH PROPOFOL  Patient Location: PACU  Anesthesia Type:General  Level of Consciousness: drowsy  Airway & Oxygen Therapy: Patient Spontanous Breathing  Post-op Assessment: Report given to RN and Post -op Vital signs reviewed and stable  Post vital signs: Reviewed and stable  Last Vitals:  Vitals Value Taken Time  BP 109/57   Temp 98   Pulse 68 09/25/22 1638  Resp 26 09/25/22 1638  SpO2 93 % 09/25/22 1638  Vitals shown include unfiled device data.  Last Pain:  Vitals:   09/25/22 1625  TempSrc:   PainSc: 0-No pain      Patients Stated Pain Goal: 6 (09/25/22 1445)  Complications: No notable events documented.

## 2022-09-25 NOTE — Plan of Care (Signed)
Problem: Education: Goal: Knowledge of General Education information will improve Description: Including pain rating scale, medication(s)/side effects and non-pharmacologic comfort measures Outcome: Progressing   Problem: Activity: Goal: Risk for activity intolerance will decrease Outcome: Progressing

## 2022-09-25 NOTE — Anesthesia Preprocedure Evaluation (Addendum)
Anesthesia Evaluation  Patient identified by MRN, date of birth, ID band Patient awake    Reviewed: Allergy & Precautions, H&P , NPO status , Patient's Chart, lab work & pertinent test results, reviewed documented beta blocker date and time   Airway Mallampati: II  TM Distance: >3 FB Neck ROM: full    Dental no notable dental hx.    Pulmonary neg pulmonary ROS, asthma , Current Smoker   Pulmonary exam normal breath sounds clear to auscultation       Cardiovascular Exercise Tolerance: Good negative cardio ROS  Rhythm:regular Rate:Normal     Neuro/Psych  Headaches PSYCHIATRIC DISORDERS Anxiety Depression Bipolar Disorder   negative neurological ROS  negative psych ROS   GI/Hepatic negative GI ROS, Neg liver ROS,GERD  ,,  Endo/Other  negative endocrine ROSdiabetes, Type 2    Renal/GU negative Renal ROS  negative genitourinary   Musculoskeletal   Abdominal   Peds  Hematology negative hematology ROS (+)   Anesthesia Other Findings   Reproductive/Obstetrics negative OB ROS                             Anesthesia Physical Anesthesia Plan  ASA: 4 and emergent  Anesthesia Plan: General   Post-op Pain Management:    Induction:   PONV Risk Score and Plan: Propofol infusion  Airway Management Planned:   Additional Equipment:   Intra-op Plan:   Post-operative Plan:   Informed Consent: I have reviewed the patients History and Physical, chart, labs and discussed the procedure including the risks, benefits and alternatives for the proposed anesthesia with the patient or authorized representative who has indicated his/her understanding and acceptance.     Dental Advisory Given  Plan Discussed with: CRNA  Anesthesia Plan Comments:        Anesthesia Quick Evaluation

## 2022-09-25 NOTE — Progress Notes (Signed)
Confirmed with Dr Levon Hedger to begin colonoscopy prep as previously ordered once returned from EGD. Stated to begin this evening. Received notification from blood bank that platelets received after hours would be an extra $1K charge. Notified Dr Levon Hedger, stated could be delayed until the am for routine delivery provided administered by 0800 for scheduled 0930 colonoscopy. Stated okay to draw morning labs at 0500 as ordered even if platelets delayed until am. Blood bank confirmed they will be delivered by 0600. Will report to night RN to be sure they are received by 0800 and notify provider if any delays/concerns. Patient began prep, zofran administered for nausea, so far tolerating fairly well. Clear liquids tolerated and verbalized understanding of being NPO after midnight tonight. Dr Levon Hedger stated will order updated consent order for colonoscopy tomorrow.

## 2022-09-25 NOTE — Anesthesia Postprocedure Evaluation (Signed)
Anesthesia Post Note  Patient: Andrea Dickson  Procedure(s) Performed: ESOPHAGOGASTRODUODENOSCOPY (EGD) WITH PROPOFOL  Patient location during evaluation: PACU Anesthesia Type: General Level of consciousness: awake and alert Pain management: pain level controlled Vital Signs Assessment: post-procedure vital signs reviewed and stable Respiratory status: spontaneous breathing, nonlabored ventilation, respiratory function stable and patient connected to nasal cannula oxygen Cardiovascular status: blood pressure returned to baseline and stable Postop Assessment: no apparent nausea or vomiting Anesthetic complications: no   No notable events documented.   Last Vitals:  Vitals:   09/25/22 1545 09/25/22 1625  BP:  (!) 109/57  Pulse: (!) 59 68  Resp: 15 18  Temp:    SpO2: 99% 93%    Last Pain:  Vitals:   09/25/22 1625  TempSrc:   PainSc: 0-No pain                 Windell Norfolk

## 2022-09-25 NOTE — TOC CM/SW Note (Signed)
Transition of Care [facility]) - Inpatient Brief Assessment   Patient Details  Name: Andrea Dickson MRN: [id number] Date of Birth: Oct 30, 1950  [facility] of [facility]) CM/SW Contact:    Villa Herb, LCSWA Phone Number: 09/25/2022, 10:55 AM   Clinical Narrative: Transition of Care Department [facility]) has reviewed patient and no TOC needs have been identified at this time. We will continue to monitor patient advancement through interdisciplinary progression rounds. If new patient transition needs arise, please place a TOC consult.  Transition of Care Asessment: Insurance and Status: Insurance coverage has been reviewed Patient has primary care physician: Yes Home environment has been reviewed: from home Prior level of function:: independent Prior/Current Home Services: No current home services Social Determinants of Health Reivew: SDOH reviewed no interventions necessary Readmission risk has been reviewed: Yes Transition of care needs: no transition of care needs at this time

## 2022-09-25 NOTE — Progress Notes (Signed)
Noted admission history questions needed to be completed, updated patient belongings and asked about home meds. Patient stated "oh yeah I have all of them in my purse." Patient retrieved from purse and agreeable to locking in pharmacy during admission. Controlled substances - diazepam 3 tablets in mixed med sleeves and bottle of diazepam 2 mg tablets containing 30 tablets, tylenol with codeine containing 7 tablets, count verified with Butler Denmark, RN. Patient verbalized understanding of sending to pharmacy.

## 2022-09-26 ENCOUNTER — Encounter (HOSPITAL_COMMUNITY)
Admission: EM | Disposition: A | Discharge status: Home / Self Care | Payer: Self-pay | Source: Home / Self Care | Attending: Internal Medicine

## 2022-09-26 ENCOUNTER — Inpatient Hospital Stay (HOSPITAL_COMMUNITY): Payer: 59 | Admitting: Anesthesiology

## 2022-09-26 ENCOUNTER — Telehealth (INDEPENDENT_AMBULATORY_CARE_PROVIDER_SITE_OTHER): Payer: Self-pay | Admitting: Gastroenterology

## 2022-09-26 DIAGNOSIS — K625 Hemorrhage of anus and rectum: Secondary | ICD-10-CM | POA: Diagnosis not present

## 2022-09-26 DIAGNOSIS — D124 Benign neoplasm of descending colon: Secondary | ICD-10-CM

## 2022-09-26 DIAGNOSIS — E782 Mixed hyperlipidemia: Secondary | ICD-10-CM | POA: Diagnosis not present

## 2022-09-26 DIAGNOSIS — D122 Benign neoplasm of ascending colon: Secondary | ICD-10-CM

## 2022-09-26 DIAGNOSIS — K648 Other hemorrhoids: Secondary | ICD-10-CM | POA: Diagnosis not present

## 2022-09-26 DIAGNOSIS — D123 Benign neoplasm of transverse colon: Secondary | ICD-10-CM | POA: Diagnosis not present

## 2022-09-26 DIAGNOSIS — K644 Residual hemorrhoidal skin tags: Secondary | ICD-10-CM

## 2022-09-26 DIAGNOSIS — F319 Bipolar disorder, unspecified: Secondary | ICD-10-CM | POA: Diagnosis not present

## 2022-09-26 DIAGNOSIS — K219 Gastro-esophageal reflux disease without esophagitis: Secondary | ICD-10-CM | POA: Diagnosis not present

## 2022-09-26 HISTORY — PX: COLONOSCOPY WITH PROPOFOL: SHX5780

## 2022-09-26 HISTORY — PX: POLYPECTOMY: SHX5525

## 2022-09-26 LAB — BASIC METABOLIC PANEL
Anion gap: 9 (ref 5–15)
BUN: 10 mg/dL (ref 8–23)
CO2: 20 mmol/L — ABNORMAL LOW (ref 22–32)
Calcium: 8 mg/dL — ABNORMAL LOW (ref 8.9–10.3)
Chloride: 107 mmol/L (ref 98–111)
Creatinine, Ser: 0.88 mg/dL (ref 0.44–1.00)
GFR, Estimated: >60 mL/min (ref >60)
Glucose, Bld: 114 mg/dL — ABNORMAL HIGH (ref 70–99)
Potassium: 4.2 mmol/L (ref 3.5–5.1)
Sodium: 136 mmol/L (ref 135–145)

## 2022-09-26 LAB — CBC
HCT: 31 % — ABNORMAL LOW (ref 36.0–46.0)
Hemoglobin: 10.5 g/dL — ABNORMAL LOW (ref 12.0–15.0)
MCH: 30.2 pg (ref 26.0–34.0)
MCHC: 33.9 g/dL (ref 30.0–36.0)
MCV: 89.1 fL (ref 80.0–100.0)
Platelets: 44 10*3/uL — ABNORMAL LOW (ref 150–400)
RBC: 3.48 MIL/uL — ABNORMAL LOW (ref 3.87–5.11)
RDW: 15.3 % (ref 11.5–15.5)
WBC: 2.6 10*3/uL — ABNORMAL LOW (ref 4.0–10.5)
nRBC: 0 % (ref 0.0–0.2)

## 2022-09-26 SURGERY — COLONOSCOPY WITH PROPOFOL
Anesthesia: General

## 2022-09-26 MED ORDER — NICOTINE 14 MG/24HR TD PT24: 14.0000 mg | MEDICATED_PATCH | Freq: Every day | TRANSDERMAL | 0 refills | Status: DC

## 2022-09-26 MED ORDER — LACTATED RINGERS IV SOLN
INTRAVENOUS | Status: DC | PRN
Start: 2022-09-26 — End: 2022-09-26

## 2022-09-26 MED ORDER — PROPOFOL 10 MG/ML IV BOLUS
INTRAVENOUS | Status: DC | PRN
Start: 2022-09-26 — End: 2022-09-26
  Administered 2022-09-26: 200 mg via INTRAVENOUS

## 2022-09-26 NOTE — Anesthesia Preprocedure Evaluation (Signed)
Anesthesia Evaluation  Patient identified by MRN, date of birth, ID band Patient awake    Reviewed: Allergy & Precautions, H&P , NPO status , Patient's Chart, lab work & pertinent test results, reviewed documented beta blocker date and time   Airway Mallampati: II  TM Distance: >3 FB Neck ROM: full    Dental no notable dental hx.    Pulmonary neg pulmonary ROS, asthma , Current Smoker   Pulmonary exam normal breath sounds clear to auscultation       Cardiovascular Exercise Tolerance: Good negative cardio ROS  Rhythm:regular Rate:Normal     Neuro/Psych  Headaches PSYCHIATRIC DISORDERS Anxiety Depression Bipolar Disorder   negative neurological ROS  negative psych ROS   GI/Hepatic negative GI ROS, Neg liver ROS,GERD  ,,  Endo/Other  negative endocrine ROSdiabetes, Type 2    Renal/GU negative Renal ROS  negative genitourinary   Musculoskeletal   Abdominal   Peds  Hematology negative hematology ROS (+)   Anesthesia Other Findings   Reproductive/Obstetrics negative OB ROS                              Anesthesia Physical Anesthesia Plan  ASA: 4 and emergent  Anesthesia Plan: General   Post-op Pain Management:    Induction:   PONV Risk Score and Plan: Propofol infusion  Airway Management Planned:   Additional Equipment:   Intra-op Plan:   Post-operative Plan:   Informed Consent: I have reviewed the patients History and Physical, chart, labs and discussed the procedure including the risks, benefits and alternatives for the proposed anesthesia with the patient or authorized representative who has indicated his/her understanding and acceptance.     Dental Advisory Given  Plan Discussed with: CRNA  Anesthesia Plan Comments:         Anesthesia Quick Evaluation

## 2022-09-26 NOTE — Op Note (Signed)
[facility] Patient Name: Andrea Dickson Procedure Date: 09/26/2022 8:58 AM MRN: [id number] Date of Birth: 03-Aug-1950 Attending MD: Katrinka Blazing , , [id number] CSN: [id number] Age: 72 Admit Type: Inpatient Procedure:                Colonoscopy Indications:              Rectal bleeding Providers:                Katrinka Blazing, Nena Polio, RN, Cyril Mourning, Technician Referring MD:              Medicines:                Monitored Anesthesia Care Complications:            No immediate complications. Estimated Blood Loss:     Estimated blood loss: none. Procedure:                Pre-Anesthesia Assessment:                           - Prior to the procedure, a History and Physical                            was performed, and patient medications, allergies                            and sensitivities were reviewed. The patient's                            tolerance of previous anesthesia was reviewed.                           - The risks and benefits of the procedure and the                            sedation options and risks were discussed with the                            patient. All questions were answered and informed                            consent was obtained.                           - ASA Grade Assessment: III - A patient with severe                            systemic disease.                           After obtaining informed consent, the colonoscope                            was passed under direct vision. Throughout the  procedure, the patient's blood pressure, pulse, and                            oxygen saturations were monitored continuously. The                            [id number]-HQ190L ([id number]) scope was introduced through                            the anus and advanced to the the terminal ileum.                            The colonoscopy was performed without difficulty.                             The patient tolerated the procedure well. The                            quality of the bowel preparation was good. Scope In: 9:50:04 AM Scope Out: 10:09:39 AM Scope Withdrawal Time: 0 hours 15 minutes 42 seconds  Total Procedure Duration: 0 hours 19 minutes 35 seconds  Findings:      Hemorrhoids were found on perianal exam.      The terminal ileum appeared normal.      Two sessile polyps were found in the descending colon and ascending       colon. The polyps were 2 to 4 mm in size. These polyps were removed with       a cold snare. Resection and retrieval were complete.      Two sessile polyps were found in the descending colon. The polyps were 3       to 4 mm in size. These polyps were removed with a cold snare. Resection       and retrieval were complete.      Non-bleeding external and internal hemorrhoids were found during       retroflexion. The hemorrhoids were medium-sized.      Note: bleeding likely related to hemorrhoidal bleeding Impression:               - Hemorrhoids found on perianal exam.                           - The examined portion of the ileum was normal.                           - Two 2 to 4 mm polyps in the descending colon and                            in the ascending colon, removed with a cold snare.                            Resected and retrieved.                           - Two 3 to 4 mm polyps in the descending colon,  removed with a cold snare. Resected and retrieved.                           - Non-bleeding external and internal hemorrhoids. Moderate Sedation:      Per Anesthesia Care Recommendation:           - Return patient to hospital ward for ongoing care.                           - Resume previous diet.                           - Await pathology results.                           - Repeat colonoscopy for surveillance based on                            pathology results. Procedure Code(s):        ---  Professional ---                           [phone], Colonoscopy, flexible; with removal of                            tumor(s), polyp(s), or other lesion(s) by snare                            technique Diagnosis Code(s):        --- Professional ---                           K64.8, Other hemorrhoids                           D12.2, Benign neoplasm of ascending colon                           D12.4, Benign neoplasm of descending colon                           K62.5, Hemorrhage of anus and rectum CPT copyright 2022 American [employer] Association. All rights reserved. The codes documented in this report are preliminary and upon coder review may  be revised to meet current compliance requirements. Katrinka Blazing, MD Katrinka Blazing,  09/26/2022 10:17:48 AM This report has been signed electronically. Number of Addenda: 1 Addendum Number: 1   Addendum Date: 09/29/2022 1:38:05 PM      Two polyps were removed from ascending colon and transverse colon      Two polyps were removed from descending colon Katrinka Blazing, MD Katrinka Blazing,  09/29/2022 1:38:45 PM This report has been signed electronically.

## 2022-09-26 NOTE — Telephone Encounter (Signed)
Hi Amanda,  Can you please schedule a follow up appointment for this patient in 4 weeks with any of the APPs (ideally [facility] or [facility]) or Dr. Jena Gauss?  Thanks,  Katrinka Blazing, MD Gastroenterology and Hepatology [facility]

## 2022-09-26 NOTE — Transfer of Care (Signed)
Immediate Anesthesia Transfer of Care Note  Patient: Andrea Dickson  Procedure(s) Performed: COLONOSCOPY WITH PROPOFOL POLYPECTOMY  Patient Location: PACU  Anesthesia Type:General  Level of Consciousness: awake and alert   Airway & Oxygen Therapy: Patient Spontanous Breathing  Post-op Assessment: Report given to RN  Post vital signs: Reviewed  Last Vitals:  Vitals Value Taken Time  BP 115/53 09/26/22 1030  Temp 36.5 C 09/26/22 1018  Pulse 59 09/26/22 1033  Resp 29 09/26/22 1033  SpO2 98 % 09/26/22 1033  Vitals shown include unfiled device data.  Last Pain:  Vitals:   09/26/22 1030  TempSrc:   PainSc: 0-No pain      Patients Stated Pain Goal: 4 (09/25/22 1636)  Complications: No notable events documented.

## 2022-09-26 NOTE — Discharge Summary (Signed)
Physician Discharge Summary   Patient: Andrea Dickson MRN: [id number] DOB: [birth year]  Admit date:     09/24/2022  Discharge date: 09/26/22  Discharge Physician: Vassie Loll   PCP: Juliette Alcide, MD   Recommendations at discharge:  Repeat CBC to follow hemoglobin/platelets trend and stability Repeat basic metabolic panel to follow electrolytes and renal function. Continue assisting patient with a smoking cessation Make sure patient follow-up with gastroenterology service as instructed.  Discharge Diagnoses: Principal Problem:   GI bleed Active Problems:   Chronic diarrhea   Hepatic cirrhosis (HCC)   Bipolar affective disorder (HCC)   Esophageal reflux   Mixed hyperlipidemia   Rectal bleeding  Brief hospital admission narrative: As per H&P written by Dr. Randol Kern 09/24/2022  Andrea Dickson  is a 72 y.o. female, with past medical history of [name] cirrhosis, chronic diarrhea, chronic nausea, hyperlipidemia, essential tremors, bipolar disorder, Beatties mellitus not on medications anymore, given controlled blood glucose. -Patient presents to ED secondary to complaints of bright red blood per rectum, reported developed this morning, she reports as fresh blood, for couple bowel movement, followed by dark-colored stool, she denies any vomiting, coffee-ground emesis, constipation, she reports chronic diarrhea at baseline, fourth generalized weakness, fatigue, she came to ED for further evaluation, she denies being on any blood thinners. -In ED she will hemoglobin 13, did drop to 11.5, platelet low at 48K, which is around her baseline, potassium was elevated at 5.2, total bilirubin elevated at 1.4, but other LFTs within normal limit, albumin normal at 4.1, [facility] hospitalist consulted to admit.  Assessment and Plan: 1-acute blood loss anemia/GI bleed -Appears to be associated with portal hypertensive gastropathy -Grade III esophageal varices appreciated without signs of acute  bleeding stigmata. -Colonoscopy demonstrating internal hemorrhoids And multiple polyps that were removed.  No active bleeding appreciated. -GI will follow-up pathology results -Patient denies overt bleeding and expressed no nausea or vomiting. -Advised to avoid the use of NSAIDs -Continue as needed over-the-counter laxative and increase fiber to keep stools soft and prevent too much friction on scene hemorrhoids -Pending pathology results outpatient follow-up with GI and future colonoscopy to be decided. -Patient received 1 unit of platelets prior to colonoscopy evaluation throughout this hospitalization -Hemoglobin levels remained stable and her BUN 17. -Continue the use of PPI.   2-liver cirrhosis due to NASH -Mild hyperbilirubinemia at time of admission; spontaneously resolved with supportive care and gentle fluid resuscitation. -Safe to resume the use of Lasix, Bactrim and Inderal continue patient follow-up with gastroenterology service.   3-SBP prophylaxis -No significant ascites appreciated -Antibiotics for SBP discontinue discharge.   4-Thrombocytopenia -1 unit platelet will be transfused to further stabilize platelet count in anticipation for endoscopic procedures -Repeat CBC in AM. -Associated with cirrhosis.   5-type 2 diabetes -No longer taking medication -Continue diet management and follow CBGs fluctuation.   6-history of bipolar disorder -Continue Wellbutrin and as needed Valium -No suicidal ideation or hallucinations.   7-hyperkalemia -Mild at time of presentation -Continue holding Aldactone -Follow electrolytes trend.   8-chronic diarrhea -Continue pancrelipase -Follow GI service recommendations.   9-hyperlipidemia -Continue the use of Zetia  10-tooth infection/abscess -Complete antibiotic therapy and follow-up as already scheduled with dentist for extraction.  Consultants: Gastroenterology service. Procedures performed: See below for x-ray report;  endoscopy/colonoscopy. Disposition: Home Diet recommendation: Low-sodium, modified carbohydrates diet/increase fiber intake.  DISCHARGE MEDICATION: Allergies as of 09/26/2022       Reactions   Pramipexole Other (See Comments)   Shaking, palpitations, headache, faint feeling  Crestor [rosuvastatin Calcium]    Muscle pain   Bee Pollen Other (See Comments)   Eyes and nose run   Pollen Extract Other (See Comments)   Eyes and nose run        Medication List     STOP taking these medications    ibuprofen 800 MG tablet Commonly known as: ADVIL   loperamide 2 MG capsule Commonly known as: IMODIUM   trimethoprim 100 MG tablet Commonly known as: TRIMPEX       TAKE these medications    acetaminophen 500 MG tablet Commonly known as: TYLENOL Take 1,000 mg by mouth 2 (two) times daily as needed for moderate pain or headache.   amoxicillin 500 MG capsule Commonly known as: AMOXIL Take 500 mg by mouth 4 (four) times daily.   buPROPion 150 MG 24 hr tablet Commonly known as: WELLBUTRIN XL Take 1 tablet (150 mg total) by mouth every morning.   cetirizine 10 MG tablet Commonly known as: ZYRTEC Take 10 mg by mouth daily. Aller-Tec   Culturelle Digestive Health Caps Take 1 capsule by mouth daily.   diazepam 2 MG tablet Commonly known as: Valium Take 1 tablet (2 mg total) by mouth daily as needed for anxiety.   estrogens (conjugated) 1.25 MG tablet Commonly known as: PREMARIN Take 1.25 mg by mouth daily.   ezetimibe 10 MG tablet Commonly known as: ZETIA Take 10 mg by mouth daily.   fluticasone 50 MCG/ACT nasal spray Commonly known as: FLONASE Place 2 sprays into both nostrils daily.   furosemide 20 MG tablet Commonly known as: LASIX Take 1 tablet (20 mg total) by mouth daily.   hyoscyamine 0.125 MG SL tablet Commonly known as: LEVSIN SL Place 1 tablet (0.125 mg total) under the tongue every 6 (six) hours as needed for cramping.   nicotine 14 mg/24hr  patch Commonly known as: NICODERM CQ - dosed in mg/24 hours Place 1 patch (14 mg total) onto the skin daily. Start taking on: September 27, 2022   omeprazole 40 MG capsule Commonly known as: PRILOSEC TAKE 1 CAPSULE BY MOUTH AT BREAKFAST AND AT BEDTIME *REFILL REQUEST*   ondansetron 4 MG tablet Commonly known as: ZOFRAN TAKE 1 TABLET BY MOUTH EVERY 8 HOURS AS NEEDED FOR NAUSEA & VOMITING   propranolol ER 80 MG 24 hr capsule Commonly known as: Inderal LA Take 1 capsule (80 mg total) by mouth daily.   spironolactone 50 MG tablet Commonly known as: ALDACTONE Every Monday, Wed, Friday, with Lasix. What changed:  how much to take how to take this when to take this additional instructions   Vitamin D3 50 MCG (2000 UT) Tabs Take 2,000 Units by mouth daily.   Zenpep 40000-126000 units Cpep Generic drug: Pancrelipase (Lip-Prot-Amyl) Take 1 capsule by mouth daily.        Follow-up Information     Burdine, Ananias Pilgrim, MD. Schedule an appointment as soon as possible for a visit in 10 day(s).   Specialty: Family Medicine Contact information: [address] [place] [address] [phone]                Discharge Exam: Ceasar Mons Weights   09/24/22 1221 09/24/22 2319  Weight: 62.6 kg 62.6 kg   General exam: Alert, awake, oriented x 3 Respiratory system: Clear to auscultation. Respiratory effort normal. Cardiovascular system:RRR. No murmurs, rubs, gallops. Gastrointestinal system: Abdomen is nondistended, soft and nontender. No organomegaly or masses felt. Normal bowel sounds heard. Central nervous system: Alert and oriented. No  focal neurological deficits. Extremities: No C/C/E, +pedal pulses Skin: No rashes, lesions or ulcers Psychiatry: Judgement and insight appear normal. Mood & affect appropriate.    Condition at discharge: Stable and improved.  The results of significant diagnostics from this hospitalization (including imaging, microbiology, ancillary and laboratory)  are listed below for reference.   Imaging Studies: No results found.  Microbiology: Results for orders placed or performed in visit on 07/14/22  Microscopic Examination     Status: Abnormal   Collection Time: 07/14/22  2:42 PM   Urine  Result Value Ref Range Status   WBC, UA 11-30 (A) 0 - 5 /hpf Final   RBC, Urine 11-30 (A) 0 - 2 /hpf Final   Epithelial Cells (non renal) 0-10 0 - 10 /hpf Final   Bacteria, UA Few (A) None seen/Few Final  Urine Culture     Status: Abnormal   Collection Time: 07/14/22  4:11 PM   Specimen: Urine   UR  Result Value Ref Range Status   Urine Culture, Routine Final report (A)  Final   Organism ID, Bacteria Proteus mirabilis (A)  Final    Comment: Cefazolin with an MIC <=16 predicts susceptibility to the oral agents cefaclor, cefdinir, cefpodoxime, cefprozil, cefuroxime, cephalexin, and loracarbef when used for therapy of uncomplicated urinary tract infections due to E. coli, Klebsiella pneumoniae, and Proteus mirabilis. 25,000-50,000 colony forming units per mL    Antimicrobial Susceptibility Comment  Final    Comment:       ** S = Susceptible; I = Intermediate; R = Resistant **                    P = Positive; N = Negative             MICS are expressed in micrograms per mL    Antibiotic                 RSLT#1    RSLT#2    RSLT#3    RSLT#4 Amoxicillin/Clavulanic Acid    S Ampicillin                     S Cefazolin                      S Cefepime                       S Ceftriaxone                    S Cefuroxime                     S Ciprofloxacin                  S Ertapenem                      S Gentamicin                     S Levofloxacin                   S Meropenem                      S Nitrofurantoin                 R Piperacillin/Tazobactam        S Tetracycline  R Tobramycin                     S Trimethoprim/Sulfa             S     Labs: CBC: Recent Labs  Lab 09/24/22 1357 09/24/22 1627 09/24/22 2236  09/25/22 0547 09/26/22 0522  WBC 4.4 3.5* 3.0* 2.4* 2.6*  NEUTROABS  --  2.3  --   --   --   HGB 13.0 11.5* 11.1* 10.5* 10.5*  HCT 39.4 34.7* 32.9* 32.2* 31.0*  MCV 89.1 89.2 89.9 90.7 89.1  PLT DCLMP 48* 45* 43* 44*   Basic Metabolic Panel: Recent Labs  Lab 09/24/22 1357 09/25/22 0547 09/26/22 0522  NA 137 136 136  K 5.2* 4.5 4.2  CL 106 107 107  CO2 23 20* 20*  GLUCOSE 83 102* 114*  BUN 17 15 10   CREATININE 1.13* 1.01* 0.88  CALCIUM 9.2 8.2* 8.0*   Liver Function Tests: Recent Labs  Lab 09/24/22 1357 09/25/22 0547  AST 23 21  ALT 18 14  ALKPHOS 104 81  BILITOT 1.4* 1.0  PROT 7.9 6.0*  ALBUMIN 4.1 3.3*   CBG: No results for input(s): "GLUCAP" in the last 168 hours.  Discharge time spent: greater than 30 minutes.  Signed: Vassie Loll, MD [facility] 09/26/2022

## 2022-09-26 NOTE — Anesthesia Postprocedure Evaluation (Signed)
Anesthesia Post Note  Patient: Andrea Dickson  Procedure(s) Performed: COLONOSCOPY WITH PROPOFOL POLYPECTOMY  Patient location during evaluation: Phase II Anesthesia Type: General Level of consciousness: awake Pain management: pain level controlled Vital Signs Assessment: post-procedure vital signs reviewed and stable Respiratory status: spontaneous breathing and respiratory function stable Cardiovascular status: blood pressure returned to baseline and stable Postop Assessment: no headache and no apparent nausea or vomiting Anesthetic complications: no Comments: Late entry   No notable events documented.   Last Vitals:  Vitals:   09/26/22 1030 09/26/22 1043  BP: (!) 115/53 111/61  Pulse: 60 (!) 59  Resp: 20 18  Temp:  36.5 C  SpO2: 99% 100%    Last Pain:  Vitals:   09/26/22 1043  TempSrc: Oral  PainSc:                  Windell Norfolk

## 2022-09-26 NOTE — Progress Notes (Signed)
Pt did not finish her bowel prep. She drank about 1/4 and said she could not take any more. I educated pt on the importance of finish the prep. Pt verbalized understanding but stated she could not drink anymore. Her bowels are still not clear.

## 2022-09-26 NOTE — Progress Notes (Signed)
We will proceed with colonoscopy as scheduled.  I thoroughly discussed with the patient the procedure, including the risks involved. Patient understands what the procedure involves including the benefits and any risks. Patient understands alternatives to the proposed procedure. Risks including (but not limited to) bleeding, tearing of the lining (perforation), rupture of adjacent organs, problems with heart and lung function, infection, and medication reactions. A small percentage of complications may require surgery, hospitalization, repeat endoscopic procedure, and/or transfusion.  Patient understood and agreed.  Andrea Peppers, MD Gastroenterology and Hepatology [facility] Gastroenterology

## 2022-09-26 NOTE — Brief Op Note (Signed)
09/26/2022  10:14 AM  PATIENT:  Andrea Dickson  72 y.o. female  PRE-OPERATIVE DIAGNOSIS:  hematochezia  POST-OPERATIVE DIAGNOSIS:  hemorrhoids; ascending colon polyp; transverse colon polyp; descending colon polyps x2;  PROCEDURE:  Procedure(s): COLONOSCOPY WITH PROPOFOL (N/A) POLYPECTOMY  SURGEON:  Surgeons and Role:    * Dolores Frame, MD - Primary  Patient underwent colonoscopy under propofol sedation.  Tolerated the procedure adequately.   FINDINGS: - Hemorrhoids found on perianal exam.  - The examined portion of the ileum was normal.  - Two 2 to 4 mm polyps in the descending colon and in the ascending colon, removed with a cold snare.  Resected and retrieved.  - Two 3 to 4 mm polyps in the descending colon, removed with a cold snare.  Resected and retrieved.  - Non-bleeding external and internal hemorrhoids.   RECOMMENDATIONS - Return patient to hospital ward for ongoing care.  - Resume previous diet.  - Await pathology results.  - Repeat colonoscopy for surveillance based on pathology results.  - Patient will follow up in GI clinic with Dr. Jena Gauss in 4 weeks. - GI service will sign-off, please call us back if you have any more questions.  Katrinka Blazing, MD Gastroenterology and Hepatology [facility] Gastroenterology

## 2022-09-28 ENCOUNTER — Encounter (HOSPITAL_COMMUNITY): Payer: Self-pay | Admitting: Gastroenterology

## 2022-09-28 LAB — BPAM PLATELET PHERESIS
Blood Product Expiration Date: 202409172359
ISSUE DATE / TIME: 202409140732
Unit Type and Rh: 5100

## 2022-09-28 LAB — PREPARE PLATELET PHERESIS: Unit division: 0

## 2022-09-29 ENCOUNTER — Telehealth (INDEPENDENT_AMBULATORY_CARE_PROVIDER_SITE_OTHER): Payer: Self-pay

## 2022-09-29 ENCOUNTER — Encounter (INDEPENDENT_AMBULATORY_CARE_PROVIDER_SITE_OTHER): Payer: Self-pay | Admitting: *Deleted

## 2022-09-29 ENCOUNTER — Other Ambulatory Visit (INDEPENDENT_AMBULATORY_CARE_PROVIDER_SITE_OTHER): Payer: Self-pay | Admitting: Gastroenterology

## 2022-09-29 DIAGNOSIS — R197 Diarrhea, unspecified: Secondary | ICD-10-CM

## 2022-09-29 LAB — SURGICAL PATHOLOGY

## 2022-09-29 NOTE — Telephone Encounter (Signed)
Noted, Thanks

## 2022-09-29 NOTE — Telephone Encounter (Signed)
Patient called today states she has had diarrhea multiple times per day after Tcs done on 09/26/2022. Patient denies any abdominal pain or rectal bleeding,but has been having issues with loose stools after every meal since the Tcs. She says she has a bm all day long,she does report the bm's start out formed but then ends up watery. She is not taking any imodium. Please advise.

## 2022-09-29 NOTE — Telephone Encounter (Signed)
I spoke to patient today regarding her symptoms.  She reports that her stools are still dark and she is having multiple bowel movements per day.  Notably, she took Augmentin for a tooth infection prior to being admitted to the hospital.  I discussed with her that we will need to evaluate for a gastrointestinal infection.  I placed orders for stool culture and C. difficile testing at [facility]. She will follow-up with her primary GI (Dr. Jena Gauss) soon.

## 2022-09-30 ENCOUNTER — Encounter (HOSPITAL_COMMUNITY): Payer: Self-pay | Admitting: Psychiatry

## 2022-09-30 ENCOUNTER — Telehealth (INDEPENDENT_AMBULATORY_CARE_PROVIDER_SITE_OTHER): Payer: 59 | Admitting: Psychiatry

## 2022-09-30 DIAGNOSIS — F3162 Bipolar disorder, current episode mixed, moderate: Secondary | ICD-10-CM | POA: Diagnosis not present

## 2022-09-30 MED ORDER — DIAZEPAM 2 MG PO TABS: 2.0000 mg | ORAL_TABLET | Freq: Every day | ORAL | 2 refills | Status: DC | PRN

## 2022-09-30 MED ORDER — PROPRANOLOL HCL ER 80 MG PO CP24: 80.0000 mg | ORAL_CAPSULE | Freq: Every day | ORAL | 2 refills | Status: DC

## 2022-09-30 MED ORDER — DIAZEPAM 10 MG PO TABS: ORAL_TABLET | ORAL | 2 refills | Status: DC

## 2022-09-30 MED ORDER — BUPROPION HCL ER (XL) 150 MG PO TB24: 150.0000 mg | ORAL_TABLET | Freq: Every morning | ORAL | 2 refills | Status: DC

## 2022-09-30 NOTE — Progress Notes (Signed)
Virtual Visit via Video Note  I connected with Andrea Dickson on 09/30/22 at  1:00 PM EDT by a video enabled telemedicine application and verified that I am speaking with the correct person using two identifiers.  Location: Patient: home Provider: office   I discussed the limitations of evaluation and management by telemedicine and the availability of in person appointments. The patient expressed understanding and agreed to proceed.      I discussed the assessment and treatment plan with the patient. The patient was provided an opportunity to ask questions and all were answered. The patient agreed with the plan and demonstrated an understanding of the instructions.   The patient was advised to call back or seek an in-person evaluation if the symptoms worsen or if the condition fails to improve as anticipated.  I provided 15 minutes of non-face-to-face time during this encounter.   Diannia Ruder, MD  [facility] MD/PA/NP OP Progress Note  09/30/2022 1:21 PM Andrea Dickson  MRN:  [id number]  Chief Complaint:  Chief Complaint  Patient presents with   Anxiety   Depression   Follow-up   HPI: This patient is a 72 year old separated white female who lives alone in [place].  She has 1 son and 3 grandchildren.  She used to work as a Freight forwarder for [employer].  She has a history of depression anxiety and possible bipolar disorder.   The patient returns for follow-up after 3 months regarding her depression and anxiety.  She has been having a lot of medical issues.  She was hospitalized earlier this month after she had rectal bleeding.she was found to have she was have continued low platelets low RBCs and hematocrit.  She does have esophageal varices secondary to portal hypertension.  For unknown reasons she has liver cirrhosis.  She also has chronic diarrhea.  She has lost about 30 pounds in the last 6 months.  She states all of this has been very disconcerting for her.   Nevertheless the medications for depression and anxiety seem to be helping.  She denies any thoughts of self-harm.  She is sleeping well most of the time.  She has seen numerous specialist regarding this and is under the care of a GI specialist Visit Diagnosis:    ICD-10-CM   1. Bipolar 1 disorder, mixed, moderate (HCC)  F31.62       Past Psychiatric History: Past outpatient treatment  Past Medical History:  Past Medical History:  Diagnosis Date   Anxiety    Asthma    Bipolar 1 disorder (HCC)    Chronic diarrhea    Cirrhosis (HCC)    Diagnosed September 2020, likely secondary to [facility], s/p Hep A/B vaccination in 2021.    Depression    Diabetes (HCC)    Essential tremor    GERD (gastroesophageal reflux disease)    Headache    High cholesterol     Past Surgical History:  Procedure Laterality Date   ABDOMINAL HYSTERECTOMY  1997   BIOPSY  01/17/2018   Procedure: BIOPSY;  Surgeon: Corbin Ade, MD;  [facility];  Service: Endoscopy;;  colon   carpal tunnel Bilateral 1999, 06/2009   COLONOSCOPY  2013   Dr. Teena Dunk: no colon polyps. quality of prep was fair. repeat colonoscopy in five years.    COLONOSCOPY WITH PROPOFOL N/A 01/17/2018   Dr. Jena Gauss: Colonic lipoma, 2 tubular adenomas removed.  Random colon biopsies negative.  Next colonoscopy 5 years.   COLONOSCOPY WITH PROPOFOL N/A 09/26/2022  Procedure: COLONOSCOPY WITH PROPOFOL;  Surgeon: Dolores Frame, MD;  [facility];  Service: Gastroenterology;  Laterality: N/A;   ESOPHAGOGASTRODUODENOSCOPY (EGD) WITH PROPOFOL N/A 04/06/2019   Procedure: ESOPHAGOGASTRODUODENOSCOPY (EGD) WITH PROPOFOL;  Surgeon: Corbin Ade, MD;   Normal esophagus, mild portal hypertensive gastropathy, otherwise normal exam.  Recommend repeat EGD in 2 years for variceal screening.    ESOPHAGOGASTRODUODENOSCOPY (EGD) WITH PROPOFOL N/A 01/19/2022   Procedure: ESOPHAGOGASTRODUODENOSCOPY (EGD) WITH PROPOFOL;  Surgeon: Corbin Ade,  MD;  [facility];  Service: Endoscopy;  Laterality: N/A;  12:45 pm, pt knows to arrive at 9:15   ESOPHAGOGASTRODUODENOSCOPY (EGD) WITH PROPOFOL N/A 09/25/2022   Procedure: ESOPHAGOGASTRODUODENOSCOPY (EGD) WITH PROPOFOL;  Surgeon: Dolores Frame, MD;  [facility];  Service: Gastroenterology;  Laterality: N/A;   POLYPECTOMY  01/17/2018   Procedure: POLYPECTOMY;  Surgeon: Corbin Ade, MD;  [facility];  Service: Endoscopy;;  colon   POLYPECTOMY  09/26/2022   Procedure: POLYPECTOMY;  Surgeon: Dolores Frame, MD;  [facility];  Service: Gastroenterology;;   SKIN CANCER EXCISION  2007   TONSILLECTOMY  1965   ulner nerve  Left 06/2009   decompression    Family Psychiatric History: See below  Family History:  Family History  Problem Relation Age of Onset   Diabetes Mother    Brain cancer Mother    Diabetes Father    Heart attack Father    Heart disease Father    Diabetes Sister    Bipolar disorder Brother    Diabetes Brother    Heart disease Brother    Colon cancer Neg Hx    Breast cancer Neg Hx     Social History:  Social History   Socioeconomic History   Marital status: Divorced    Spouse name: Not on file   Number of children: 1   Years of education: 12   Highest education level: Not on file  Occupational History   Occupation: retired    Comment: employed at [employer]  Tobacco Use   Smoking status: Every Day    Current packs/day: 0.50    Average packs/day: 0.5 packs/day for 43.0 years (21.5 ttl pk-yrs)    Types: Cigarettes   Smokeless tobacco: Never   Tobacco comments:    smoking since 72yrs old.    Vaping Use   Vaping status: Never Used  Substance and Sexual Activity   Alcohol use: No    Alcohol/week: 0.0 standard drinks of alcohol   Drug use: No   Sexual activity: Not Currently  Other Topics Concern   Not on file  Social History Narrative   Lives alone.  Retired.  Education 12th  grade.  One child.     Caffeine use- sodas, 2 daily   Social Determinants of Health   Financial Resource Strain: Low Risk  (11/10/2021)   Received from [facility], [facility]   Overall Financial Resource Strain (CARDIA)    Difficulty of Paying Living Expenses: Not hard at all  Food Insecurity: No Food Insecurity (09/25/2022)   Hunger Vital Sign    Worried About Running Out of Food in the Last Year: Never true    Ran Out of Food in the Last Year: Never true  Transportation Needs: No Transportation Needs (09/25/2022)   PRAPARE - Administrator, Civil Service (Medical): No    Lack of Transportation (Non-Medical): No  Physical Activity: Inactive (11/10/2021)   [facility], [facility]   Exercise Vital Sign    Days of Exercise per Week: 0 days    Minutes of Exercise per Session: 0 min  Stress: No Stress Concern Present (11/10/2021)   Received from [facility], [facility] Occupational [facility] - Occupational Stress Questionnaire    Feeling of Stress : Not at all  Social Connections: Socially Isolated (11/10/2021)   Received from [facility], [facility]   Social Connection and Isolation Panel [NHANES]    Frequency of Communication with Friends and Family: More than three times a week    Frequency of Social Gatherings with Friends and Family: Once a week    Attends Religious Services: Never    Database administrator or Organizations: No    Attends Banker Meetings: Never    Marital Status: Divorced    Allergies:  Allergies  Allergen Reactions   Pramipexole Other (See Comments)    Shaking, palpitations, headache, faint feeling   Crestor [Rosuvastatin Calcium]     Muscle pain   Bee Pollen Other (See Comments)    Eyes and nose run   Pollen Extract Other (See Comments)    Eyes and nose run    Metabolic Disorder Labs: Lab Results  Component Value Date   HGBA1C 5.4 04/24/2015   MPG 108  04/24/2015   No results found for: "PROLACTIN" Lab Results  Component Value Date   CHOL 178 02/17/2016   TRIG 229 (H) 02/17/2016   HDL 30 (L) 02/17/2016   CHOLHDL 5.9 (H) 02/17/2016   VLDL 46 (H) 02/17/2016   LDLCALC 102 (H) 02/17/2016   LDLCALC 126 (H) 11/18/2015   Lab Results  Component Value Date   TSH 1.00 12/24/2021   TSH 2.96 08/29/2019    Therapeutic Level Labs: Lab Results  Component Value Date   LITHIUM 0.8 08/29/2019   LITHIUM 0.4 (L) 10/13/2018   Lab Results  Component Value Date   VALPROATE 62.2 08/20/2016   VALPROATE 83.0 10/14/2015   No results found for: "CBMZ"  Current Medications: Current Outpatient Medications  Medication Sig Dispense Refill   acetaminophen (TYLENOL) 500 MG tablet Take 1,000 mg by mouth 2 (two) times daily as needed for moderate pain or headache.     amoxicillin (AMOXIL) 500 MG capsule Take 500 mg by mouth 4 (four) times daily.     buPROPion (WELLBUTRIN XL) 150 MG 24 hr tablet Take 1 tablet (150 mg total) by mouth every morning. 90 tablet 2   cetirizine (ZYRTEC) 10 MG tablet Take 10 mg by mouth daily. Aller-Tec     Cholecalciferol (VITAMIN D3) 50 MCG (2000 UT) TABS Take 2,000 Units by mouth daily.      diazepam (VALIUM) 10 MG tablet TAKE ONE TABLET BY MOUTH EVERYDAY AT BEDTIME 60 tablet 2   diazepam (VALIUM) 2 MG tablet Take 1 tablet (2 mg total) by mouth daily as needed for anxiety. 30 tablet 2   estrogens, conjugated, (PREMARIN) 1.25 MG tablet Take 1.25 mg by mouth daily.      ezetimibe (ZETIA) 10 MG tablet Take 10 mg by mouth daily.     fluticasone (FLONASE) 50 MCG/ACT nasal spray Place 2 sprays into both nostrils daily.     furosemide (LASIX) 20 MG tablet Take 1 tablet (20 mg total) by mouth daily. 90 tablet 3   hyoscyamine (LEVSIN SL) 0.125 MG SL tablet Place 1 tablet (0.125 mg total) under the tongue every 6 (  six) hours as needed for cramping. 30 tablet 1   Lactobacillus-Inulin (CULTURELLE DIGESTIVE HEALTH) CAPS Take 1 capsule by  mouth daily.     nicotine (NICODERM CQ - DOSED IN MG/24 HOURS) 14 mg/24hr patch Place 1 patch (14 mg total) onto the skin daily. 28 patch 0   omeprazole (PRILOSEC) 40 MG capsule TAKE 1 CAPSULE BY MOUTH AT BREAKFAST AND AT BEDTIME *REFILL REQUEST* 60 capsule 10   ondansetron (ZOFRAN) 4 MG tablet TAKE 1 TABLET BY MOUTH EVERY 8 HOURS AS NEEDED FOR NAUSEA & VOMITING 30 tablet 3   Pancrelipase, Lip-Prot-Amyl, (ZENPEP) 40000-126000 units CPEP Take 1 capsule by mouth daily.     propranolol ER (INDERAL LA) 80 MG 24 hr capsule Take 1 capsule (80 mg total) by mouth daily. 30 capsule 2   spironolactone (ALDACTONE) 50 MG tablet Every Monday, Wed, Friday, with Lasix. (Patient taking differently: Take 50 mg by mouth daily.) 30 tablet 1   No current facility-administered medications for this visit.     Musculoskeletal: Strength & Muscle Tone: within normal limits Gait & Station: normal Patient leans: N/A  Psychiatric Specialty Exam: Review of Systems  Constitutional:  Positive for unexpected weight change.  Gastrointestinal:  Positive for diarrhea.  Psychiatric/Behavioral:  The patient is nervous/anxious.   All other systems reviewed and are negative.   There were no vitals taken for this visit.There is no height or weight on file to calculate BMI.  General Appearance: Casual and Fairly Groomed  Eye Contact:  Good  Speech:  Clear and Coherent  Volume:  Normal  Mood:  Euthymic  Affect:  Congruent  Thought Process:  Goal Directed  Orientation:  Full (Time, Place, and Person)  Thought Content: Rumination   Suicidal Thoughts:  No  Homicidal Thoughts:  No  Memory:  Immediate;   Good Recent;   Good Remote;   Good  Judgement:  Good  Insight:  Fair  Psychomotor Activity:  Decreased  Concentration:  Concentration: Good and Attention Span: Good  Recall:  Good  Fund of Knowledge: Good  Language: Good  Akathisia:  No  Handed:  Right  AIMS (if indicated): not done  Assets:  Communication  Skills Desire for Improvement Physical Health Resilience Social Support Talents/Skills  ADL's:  Intact  Cognition: WNL  Sleep:  Good   Screenings: PHQ2-9    Flowsheet Row Video Visit from 09/30/2021 in [place] [place] Video Visit from 06/26/2021 in [facility] at [place] Video Visit from 03/26/2021 in [facility] at [place] Video Visit from 12/18/2020 in [facility] at [place] Video Visit from 09/24/2020 in [facility] at [facility] Total Score 0 0 0 0 0      Flowsheet Row ED to Hosp-Admission (Discharged) from 09/24/2022 in [place] [facility] ED from 07/25/2022 in [facility] Emergency Department at [facility] Admission (Discharged) from 01/19/2022 in [place] [facility]  C-SSRS RISK CATEGORY No Risk No Risk No Risk        Assessment and Plan: This patient is a 72 year old female with a history of bipolar disorder anxiety and tremor.  She is doing well on her current regimen despite all of her medical issues.  She will continue Wellbutrin XL 150 mg every morning for depression, Valium 10 mg at bedtime for sleep and Valium 2 mg once daily as needed for anxiety and Inderal LA 80 mg daily for tremor.  She  will return to see me in 3 months  Collaboration of Care: Collaboration of Care: Primary Care Provider AEB notes will be shared with PCP at patient's request  Patient/Guardian was advised Release of Information must be obtained prior to any record release in order to collaborate their care with an outside provider. Patient/Guardian was advised if they have not already done so to contact the registration department to sign all necessary forms in order for [place] to release information regarding their care.   Consent: Patient/Guardian gives verbal consent for treatment and assignment of benefits for services  provided during this visit. Patient/Guardian expressed understanding and agreed to proceed.    Diannia Ruder, MD 09/30/2022, 1:21 PM

## 2022-10-01 DIAGNOSIS — R197 Diarrhea, unspecified: Secondary | ICD-10-CM | POA: Diagnosis not present

## 2022-10-05 LAB — C DIFFICILE TOXINS A+B W/RFLX: C difficile Toxins A+B, EIA: NEGATIVE

## 2022-10-05 LAB — STOOL CULTURE: E coli, Shiga toxin Assay: NEGATIVE

## 2022-10-05 LAB — C DIFFICILE, CYTOTOXIN B

## 2022-10-06 ENCOUNTER — Telehealth: Payer: Self-pay

## 2022-10-06 DIAGNOSIS — F17210 Nicotine dependence, cigarettes, uncomplicated: Secondary | ICD-10-CM | POA: Diagnosis not present

## 2022-10-06 DIAGNOSIS — K922 Gastrointestinal hemorrhage, unspecified: Secondary | ICD-10-CM | POA: Diagnosis not present

## 2022-10-06 DIAGNOSIS — R109 Unspecified abdominal pain: Secondary | ICD-10-CM | POA: Diagnosis not present

## 2022-10-06 DIAGNOSIS — D696 Thrombocytopenia, unspecified: Secondary | ICD-10-CM | POA: Diagnosis not present

## 2022-10-06 DIAGNOSIS — K760 Fatty (change of) liver, not elsewhere classified: Secondary | ICD-10-CM | POA: Diagnosis not present

## 2022-10-06 DIAGNOSIS — I8500 Esophageal varices without bleeding: Secondary | ICD-10-CM | POA: Diagnosis not present

## 2022-10-06 DIAGNOSIS — K766 Portal hypertension: Secondary | ICD-10-CM | POA: Diagnosis not present

## 2022-10-06 DIAGNOSIS — I700 Atherosclerosis of aorta: Secondary | ICD-10-CM | POA: Diagnosis not present

## 2022-10-06 DIAGNOSIS — D62 Acute posthemorrhagic anemia: Secondary | ICD-10-CM | POA: Diagnosis not present

## 2022-10-06 DIAGNOSIS — K7460 Unspecified cirrhosis of liver: Secondary | ICD-10-CM | POA: Diagnosis not present

## 2022-10-06 DIAGNOSIS — E1169 Type 2 diabetes mellitus with other specified complication: Secondary | ICD-10-CM | POA: Diagnosis not present

## 2022-10-06 DIAGNOSIS — N1831 Chronic kidney disease, stage 3a: Secondary | ICD-10-CM | POA: Diagnosis not present

## 2022-10-06 NOTE — Telephone Encounter (Signed)
Not in [place]. We are referring her to Nephrology here locally?

## 2022-10-06 NOTE — Telephone Encounter (Signed)
[facility] [place] @ [phone] phoned regarding this pt. She is in the office now. They were calling to see if we told the pt we were referring her to a kidney specialist in [place]. Please advise

## 2022-10-07 NOTE — Telephone Encounter (Signed)
Letter faxed to [facility] with pt's appt date and time

## 2022-10-19 ENCOUNTER — Telehealth: Payer: Self-pay

## 2022-10-19 NOTE — Telephone Encounter (Signed)
Phoned the pt twice. VM will not let me through.

## 2022-10-19 NOTE — Telephone Encounter (Signed)
If she feels she needs a para, we need to arrange this at [facility]. No more than 4 liters removed. Recommend 25 grams IV Albumin at start. Needs cell count and culture. Keep upcoming appt next week. If needed, can be seen sooner. No diuretics the day of the paracentesis.

## 2022-10-19 NOTE — Telephone Encounter (Signed)
Pt called stating she has built up with fluid again. Pt wants to know what do you want her to do. Pt was referred to specialist according the last office note. Please advise

## 2022-10-20 NOTE — Telephone Encounter (Signed)
Phoned the pt back, vm asked was this a call back and afterwards phone hung up

## 2022-10-23 NOTE — Telephone Encounter (Signed)
Pt has an appt on Monday, October 14th

## 2022-10-26 ENCOUNTER — Encounter: Payer: Self-pay | Admitting: Gastroenterology

## 2022-10-26 ENCOUNTER — Ambulatory Visit: Payer: 59 | Admitting: Gastroenterology

## 2022-10-26 VITALS — BP 114/68 | HR 73 | Temp 98.2°F | Ht 66.0 in | Wt 144.8 lb

## 2022-10-26 DIAGNOSIS — K625 Hemorrhage of anus and rectum: Secondary | ICD-10-CM

## 2022-10-26 DIAGNOSIS — R188 Other ascites: Secondary | ICD-10-CM | POA: Diagnosis not present

## 2022-10-26 DIAGNOSIS — K529 Noninfective gastroenteritis and colitis, unspecified: Secondary | ICD-10-CM

## 2022-10-26 DIAGNOSIS — K7460 Unspecified cirrhosis of liver: Secondary | ICD-10-CM

## 2022-10-26 DIAGNOSIS — D649 Anemia, unspecified: Secondary | ICD-10-CM | POA: Diagnosis not present

## 2022-10-26 DIAGNOSIS — K580 Irritable bowel syndrome with diarrhea: Secondary | ICD-10-CM

## 2022-10-26 DIAGNOSIS — K219 Gastro-esophageal reflux disease without esophagitis: Secondary | ICD-10-CM

## 2022-10-26 NOTE — Telephone Encounter (Signed)
Pt in the office today will be advised of this while she is here

## 2022-10-26 NOTE — Progress Notes (Signed)
GI Office Note    Referring Provider: Juliette Alcide, MD Primary Care Physician:  Juliette Alcide, MD Primary Gastroenterologist: Gerrit Friends.Rourk, MD   Date:  10/26/2022  ID:  [name], DOB 1950-09-29, MRN [id number]   Chief Complaint   Chief Complaint  Patient presents with   Follow-up    Follow up on cirrhosis   History of Present Illness  Andrea Dickson is a 72 y.o. female with a history of Andrea Dickson cirrhosis, chronic diarrhea, chronic nausea, HLD, essential tremors, bipolar, and diabetes controlled without medication  presenting today with complaint of ascites and diarrhea.  EGD 01/19/2022: -3 columns of grade 2/limited grade 3 varices -Portal hypertensive gastropathy -Friable gastric mucosa -Continue Inderal daily -No repeat EGD needed as long as no evidence of upper GI bleed   Colonoscopy Jan 2020: colonic lipoma, tubular adenomas, random colonic biopsies negative.   Underwent paracentesis in June 2024.  CTA/BRTO protocol 08/17/2022: -Cirrhosis -Evidence of portal hypertension with ascites and splenomegaly -Small hiatal hernia -Decompressed gallbladder -No evidence of portal vein thrombosis  Last office visit 09/17/2022.  Patient reported history of chronic diarrhea with evaluation that included a trial of Xifaxan in January 2024 of IBS-D without any significant improvement.  Continues to have intermittent soft and loose stools.  Noted to have prior negative workup of negative colon biopsies, celiac screen negative, and trialed off metformin with no improvement.  Fecal elastase normal.  Dicyclomine also not helpful in the past.  Was given trial of Zenpep samples with some improvement.  Still sometimes has diarrhea no matter what she eats.  Denied any abdominal distention, confusion, mental status changes, jaundice, or pruritus.  Given increasing creatinine she is currently doing diuretics every other day initially and then went back to every day given increase  of fluid in her extremities.  9/17 -patient reported dark stools and multiple episodes of diarrhea a day after TCS.  Dr. Levon Hedger recommended C. difficile and stool cultures to evaluate for infection given recent Augmentin intake.  10/7 -patient reported feeling like she needed paracentesis, and I recommended that she could obtain a para at [facility] to remove no more than 4 L and give 25 g of albumin.  Appears patient was referred to nephrology in the latter part of September.  EGD 09/25/2022: -Grade 3 esophageal varices -Portal hypertensive gastropathy -Normal duodenum -Advised platelets -Start propranolol 80 mg daily after colonoscopy  Colonoscopy 09/26/2022: -Hemorrhoids and perianal exam, internal hemorrhoids -Two 2-4 mm polyps in the descending and ascending colon -2-3-4 mm polyps in the descending colon -path: tubular adenomas -Repeat in 5 years.   Today: BM starts out somewhat normal but then turns to loose stools. Having to wear depends. Does not know when she has to go at times, hits her suddenly. She had some samples of zenpep and would be good for a few days and then still have some symptoms. Taking Zenpep 40K. Still having the same abdominal pain and levsin is helpful.   Taking diuretics daily currently. She reports she can hardly breath if she does not take these daily. She is on nicotene patches. Has not smoked since hospitalization. No mental status changes, confusion, pruritus, jaundice.   Very little bright red blood per rectum (possibly related to hemorrhoids and frequent trips to the bathroom). Reports recently negative heme positive stool.   Goes back to see Dr. Leandrew Koyanagi on 10/24 for blood work, states she just recently had some follow up labs at his office.   Zofran  helps with morning nausea. Takes omeprazole 40 mg BID. PCP took away imodium, trimpex, and ibuprofen.   Current Outpatient Medications  Medication Sig Dispense Refill   acetaminophen (TYLENOL) 500 MG  tablet Take 1,000 mg by mouth 2 (two) times daily as needed for moderate pain or headache.     buPROPion (WELLBUTRIN XL) 150 MG 24 hr tablet Take 1 tablet (150 mg total) by mouth every morning. 90 tablet 2   cetirizine (ZYRTEC) 10 MG tablet Take 10 mg by mouth daily. Aller-Tec     Cholecalciferol (VITAMIN D3) 50 MCG (2000 UT) TABS Take 2,000 Units by mouth daily.      diazepam (VALIUM) 10 MG tablet TAKE ONE TABLET BY MOUTH EVERYDAY AT BEDTIME 60 tablet 2   diazepam (VALIUM) 2 MG tablet Take 1 tablet (2 mg total) by mouth daily as needed for anxiety. 30 tablet 2   estrogens, conjugated, (PREMARIN) 1.25 MG tablet Take 1.25 mg by mouth daily.      ezetimibe (ZETIA) 10 MG tablet Take 10 mg by mouth daily.     fluticasone (FLONASE) 50 MCG/ACT nasal spray Place 2 sprays into both nostrils daily.     furosemide (LASIX) 20 MG tablet Take 1 tablet (20 mg total) by mouth daily. 90 tablet 3   hyoscyamine (LEVSIN SL) 0.125 MG SL tablet Place 1 tablet (0.125 mg total) under the tongue every 6 (six) hours as needed for cramping. 30 tablet 1   Lactobacillus-Inulin (CULTURELLE DIGESTIVE HEALTH) CAPS Take 1 capsule by mouth daily.     nicotine (NICODERM CQ - DOSED IN MG/24 HOURS) 14 mg/24hr patch Place 1 patch (14 mg total) onto the skin daily. 28 patch 0   omeprazole (PRILOSEC) 40 MG capsule TAKE 1 CAPSULE BY MOUTH AT BREAKFAST AND AT BEDTIME *REFILL REQUEST* 60 capsule 10   ondansetron (ZOFRAN) 4 MG tablet TAKE 1 TABLET BY MOUTH EVERY 8 HOURS AS NEEDED FOR NAUSEA & VOMITING 30 tablet 3   propranolol ER (INDERAL LA) 80 MG 24 hr capsule Take 1 capsule (80 mg total) by mouth daily. 30 capsule 2   spironolactone (ALDACTONE) 50 MG tablet Every Monday, Wed, Friday, with Lasix. (Patient taking differently: Take 50 mg by mouth daily.) 30 tablet 1   amoxicillin (AMOXIL) 500 MG capsule Take 500 mg by mouth 4 (four) times daily. (Patient not taking: Reported on 10/26/2022)     Pancrelipase, Lip-Prot-Amyl, (ZENPEP)  40000-126000 units CPEP Take 1 capsule by mouth daily. (Patient not taking: Reported on 10/26/2022)     No current facility-administered medications for this visit.    Past Medical History:  Diagnosis Date   Anxiety    Asthma    Bipolar 1 disorder (HCC)    Chronic diarrhea    Cirrhosis (HCC)    Diagnosed September 2020, likely secondary to [facility], s/p Hep A/B vaccination in 2021.    Depression    Diabetes (HCC)    Essential tremor    GERD (gastroesophageal reflux disease)    Headache    High cholesterol     Past Surgical History:  Procedure Laterality Date   ABDOMINAL HYSTERECTOMY  1997   BIOPSY  01/17/2018   Procedure: BIOPSY;  Surgeon: Corbin Ade, MD;  [facility];  Service: Endoscopy;;  colon   carpal tunnel Bilateral 1999, 06/2009   COLONOSCOPY  2013   Dr. Teena Dunk: no colon polyps. quality of prep was fair. repeat colonoscopy in five years.    COLONOSCOPY WITH PROPOFOL N/A 01/17/2018   Dr.  Rourk: Colonic lipoma, 2 tubular adenomas removed.  Random colon biopsies negative.  Next colonoscopy 5 years.   COLONOSCOPY WITH PROPOFOL N/A 09/26/2022   Procedure: COLONOSCOPY WITH PROPOFOL;  Surgeon: Dolores Frame, MD;  [facility]: [facility];  Service: Gastroenterology;  Laterality: N/A;   ESOPHAGOGASTRODUODENOSCOPY (EGD) WITH PROPOFOL N/A 04/06/2019   Procedure: ESOPHAGOGASTRODUODENOSCOPY (EGD) WITH PROPOFOL;  Surgeon: Corbin Ade, MD;   Normal esophagus, mild portal hypertensive gastropathy, otherwise normal exam.  Recommend repeat EGD in 2 years for variceal screening.    ESOPHAGOGASTRODUODENOSCOPY (EGD) WITH PROPOFOL N/A 01/19/2022   Procedure: ESOPHAGOGASTRODUODENOSCOPY (EGD) WITH PROPOFOL;  Surgeon: Corbin Ade, MD;  [facility];  Service: Endoscopy;  Laterality: N/A;  12:45 pm, pt knows to arrive at 9:15   ESOPHAGOGASTRODUODENOSCOPY (EGD) WITH PROPOFOL N/A 09/25/2022   Procedure: ESOPHAGOGASTRODUODENOSCOPY (EGD) WITH PROPOFOL;  Surgeon:  Dolores Frame, MD;  [facility];  Service: Gastroenterology;  Laterality: N/A;   POLYPECTOMY  01/17/2018   Procedure: POLYPECTOMY;  Surgeon: Corbin Ade, MD;  [facility];  Service: Endoscopy;;  colon   POLYPECTOMY  09/26/2022   Procedure: POLYPECTOMY;  Surgeon: Dolores Frame, MD;  [facility];  Service: Gastroenterology;;   SKIN CANCER EXCISION  2007   TONSILLECTOMY  1965   ulner nerve  Left 06/2009   decompression    Family History  Problem Relation Age of Onset   Diabetes Mother    Brain cancer Mother    Diabetes Father    Heart attack Father    Heart disease Father    Diabetes Sister    Bipolar disorder Brother    Diabetes Brother    Heart disease Brother    Colon cancer Neg Hx    Breast cancer Neg Hx     Allergies as of 10/26/2022 - Review Complete 10/26/2022  Allergen Reaction Noted   Pramipexole Other (See Comments) 04/24/2015   Crestor [rosuvastatin calcium]  03/28/2019   Bee pollen Other (See Comments) 04/24/2015   Pollen extract Other (See Comments) 04/24/2015    Social History   Socioeconomic History   Marital status: Divorced    Spouse name: Not on file   Number of children: 1   Years of education: 12   Highest education level: Not on file  Occupational History   Occupation: retired    Comment: employed at [employer]  Tobacco Use   Smoking status: Every Day    Current packs/day: 0.50    Average packs/day: 0.5 packs/day for 43.0 years (21.5 ttl pk-yrs)    Types: Cigarettes   Smokeless tobacco: Never   Tobacco comments:    smoking since 72yrs old.    Vaping Use   Vaping status: Never Used  Substance and Sexual Activity   Alcohol use: No    Alcohol/week: 0.0 standard drinks of alcohol   Drug use: No   Sexual activity: Not Currently  Other Topics Concern   Not on file  Social History Narrative   Lives alone.  Retired.  Education 12th grade.  One child.     Caffeine use- sodas, 2  daily   Social Determinants of Health   Financial Resource Strain: Low Risk  (11/10/2021)   Received from [facility], [facility]   Overall Financial Resource Strain (CARDIA)    Difficulty of Paying Living Expenses: Not hard at all  Food Insecurity: No Food Insecurity (09/25/2022)   Hunger Vital Sign    Worried About Running Out  of Food in the Last Year: Never true    Ran Out of Food in the Last Year: Never true  Transportation Needs: No Transportation Needs (09/25/2022)   PRAPARE - Administrator, Civil Service (Medical): No    Lack of Transportation (Non-Medical): No  Physical Activity: Inactive (11/10/2021)   Received from [facility], [facility]   Exercise Vital Sign    Days of Exercise per Week: 0 days    Minutes of Exercise per Session: 0 min  Stress: No Stress Concern Present (11/10/2021)   Received from [facility], [facility] - Occupational Stress Questionnaire    Feeling of Stress : Not at all  Social Connections: Socially Isolated (11/10/2021)   Received from [facility], [facility]   Social Connection and Isolation Panel [NHANES]    Frequency of Communication with Friends and Family: More than three times a week    Frequency of Social Gatherings with Friends and Family: Once a week    Attends Religious Services: Never    Database administrator or Organizations: No    Attends Engineer, structural: Never    Marital Status: Divorced     Review of Systems   Gen: Denies fever, chills, anorexia. Denies fatigue, weakness, weight loss.  CV: Denies chest pain, palpitations, syncope, peripheral edema, and claudication. Resp: Denies dyspnea at rest, cough, wheezing, coughing up blood, and pleurisy. GI: See HPI Derm: Denies rash, itching, dry skin Psych: Denies depression, anxiety, memory loss, confusion. No homicidal or suicidal ideation.  Heme: Denies bruising, bleeding, and  enlarged lymph nodes.   Physical Exam   BP 114/68   Pulse 73   Temp 98.2 F (36.8 C)   Ht 5\' 6"  (1.676 m)   Wt 144 lb 12.8 oz (65.7 kg)   BMI 23.37 kg/m   General:   Alert and oriented. No distress noted. Pleasant and cooperative.  Head:  Normocephalic and atraumatic. Eyes:  Conjuctiva clear without scleral icterus. Mouth:  Oral mucosa pink and moist. Good dentition. No lesions. Abdomen:  +BS, soft.  Mild lower abdominal distention, mild TTP to LLQ and LUQ.  No rebound or guarding. No HSM or masses noted. Rectal: deferred Msk:  Symmetrical without gross deformities. Normal posture. Extremities:  Without edema. Neurologic:  Alert and  oriented x4 Psych:  Alert and cooperative. Normal mood and affect.   Assessment  Andrea Dickson is a 72 y.o. female with a history of Andrea Dickson cirrhosis, chronic diarrhea, chronic nausea, HLD, essential tremors, bipolar, and diabetes controlled without medication  presenting today forhospital follow up of rectal bleeding, ascites, and diarrhea.   Diarrhea: Has had extensive workup including negative celiac screen, negative colonic biopsies, normal fecal elastase.  Has trialed off metformin without improvement.  No improvement with dicyclomine in the past.  Has had some improvement with Imodium but recently DC'd by PCP.  Possibly noticed some improvement with Zenpep samples but was only given 40,000 units and was only taking this once daily.  Will provide 60K samples today and advised to take 1 tablet with meals.  Continues to have associated lower abdominal pain.  Will trial this increased dose of Zenpep and if no improvement may resubmit for another short course of Xifaxan.  Decompensated cirrhosis: Required for several paracentesis earlier this year.  Has had paracentesis x 2.  Remains on low-dose Lasix at 20 mg and Aldactone 50 mg daily, at 1  point had to have every other day dosing due to electrolytes and renal function.  Will obtain recent labs from  PCP posthospitalization and if kidney function stable then may consider increasing diuretics given reports of increased ascites.  Not taut on exam today and no need for paracentesis today.  EGD from earlier this year for variceal bleeding and remains on beta-blocker.  Recently had repeat EGD while inpatient given anemia and rectal bleeding and was again found to have varices and PHG.  Imaging and tumor marker up-to-date, due for hepatoma screening in February 2024 and we will plan to recheck AFP with MELD labs in 3 months. MELD 3.0: 11 while inpatient.   Rectal bleeding, anemia: Recent hospitalization for rectal bleeding.  Underwent EGD and colonoscopy as outlined in HPI. Recently stopped taking NSAIDs.  Advised to continue PPI twice daily.  Last hemoglobin on file at 10.5.  Had normal hemoglobin at 12.4-13 from December 2023 to September 2024.  Will assess recent labs from PCP to see if hemoglobin improving.  PLAN   [place] February 2024 Continue Lasix 20 mg daily and Aldactone 50 mg daily Continue propranolol 80 mg daily. Continue omeprazole 40 mg twice daily.  Continue probiotic.  Trial of zenpep 60000 units with all meals Advised another trial of Xifaxan if no improvement with Zenpep Request labs from PCP AFP and MELD labs in 3 months.  2g sodium diet Follow up in 3 months with AB   Brooke Bonito, MSN, FNP-BC, AGACNP-BC [facility]

## 2022-10-26 NOTE — Patient Instructions (Addendum)
I am giving you some higher dose Zenpep to try to see if this helps you with your diarrhea.  Please try taking this with each meal.  Take 1 dose around lunchtime even though you do not eat a whole lot they for sure take this with dinner.  Continue Lasix 20 mg and spironolactone 50 mg daily.  Continue propranolol 80 mg daily.  Continue omeprazole 40 mg twice daily and please resume probiotic.  We will request your most recent labs from her primary care, if your creatinine is stable I will likely try to increase your diuretics.  Cirrhosis Lifestyle Recommendations:  High-protein diet from a primarily plant-based diet. Avoid red meat.  No raw or undercooked meat, seafood, or shellfish. Low-fat/cholesterol/carbohydrate diet. Limit sodium to no more than 2000 mg/day including everything that you eat and drink. Recommend at least 30 minutes of aerobic and resistance exercise 3 days/week. Limit Tylenol to 2000 mg daily.   Continue to avoid NSAIDs (ibuprofen, Aleve, Advil).  Congratulations on quitting smoking.  Will have you follow-up in 3 months.  It was a pleasure to see you today. I want to create trusting relationships with patients. If you receive a survey regarding your visit,  I greatly appreciate you taking time to fill this out on paper or through your MyChart. I value your feedback.  Brooke Bonito, MSN, FNP-BC, AGACNP-BC [facility]

## 2022-10-27 ENCOUNTER — Telehealth: Payer: Self-pay | Admitting: Gastroenterology

## 2022-10-27 NOTE — Telephone Encounter (Signed)
Reviewed patient's labs from PCP.  LFTs are, GFR slightly decreased.  Hemoglobin stable at 11, this may be new baseline for her but we will continue to monitor.  Platelets remain low at 52, do not recommend another transfusion right now but should monitor for ongoing bleeding.

## 2022-10-29 ENCOUNTER — Encounter: Payer: Self-pay | Admitting: *Deleted

## 2022-10-29 ENCOUNTER — Other Ambulatory Visit: Payer: Self-pay | Admitting: Gastroenterology

## 2022-10-30 ENCOUNTER — Telehealth: Payer: Self-pay | Admitting: *Deleted

## 2022-10-30 ENCOUNTER — Other Ambulatory Visit: Payer: Self-pay | Admitting: Gastroenterology

## 2022-10-30 MED ORDER — ZENPEP 60000-189600 UNITS PO CPEP: 1.0000 | ORAL_CAPSULE | Freq: Three times a day (TID) | ORAL | 2 refills | Status: DC

## 2022-10-30 NOTE — Telephone Encounter (Signed)
Pt called and states Zenpep 60000 is working well for her. Send prescription to [facility].

## 2022-10-30 NOTE — Telephone Encounter (Signed)
Pt seen [facility] last

## 2022-11-02 DIAGNOSIS — R161 Splenomegaly, not elsewhere classified: Secondary | ICD-10-CM | POA: Diagnosis not present

## 2022-11-02 DIAGNOSIS — G894 Chronic pain syndrome: Secondary | ICD-10-CM | POA: Diagnosis not present

## 2022-11-02 DIAGNOSIS — R109 Unspecified abdominal pain: Secondary | ICD-10-CM | POA: Diagnosis not present

## 2022-11-02 DIAGNOSIS — K7460 Unspecified cirrhosis of liver: Secondary | ICD-10-CM | POA: Diagnosis not present

## 2022-11-02 DIAGNOSIS — K760 Fatty (change of) liver, not elsewhere classified: Secondary | ICD-10-CM | POA: Diagnosis not present

## 2022-11-02 DIAGNOSIS — Z79891 Long term (current) use of opiate analgesic: Secondary | ICD-10-CM | POA: Diagnosis not present

## 2022-11-03 ENCOUNTER — Other Ambulatory Visit: Payer: Self-pay | Admitting: Gastroenterology

## 2022-11-06 DIAGNOSIS — R5383 Other fatigue: Secondary | ICD-10-CM | POA: Diagnosis not present

## 2022-11-06 DIAGNOSIS — E1169 Type 2 diabetes mellitus with other specified complication: Secondary | ICD-10-CM | POA: Diagnosis not present

## 2022-11-06 DIAGNOSIS — E1165 Type 2 diabetes mellitus with hyperglycemia: Secondary | ICD-10-CM | POA: Diagnosis not present

## 2022-11-06 DIAGNOSIS — R7989 Other specified abnormal findings of blood chemistry: Secondary | ICD-10-CM | POA: Diagnosis not present

## 2022-11-06 DIAGNOSIS — K219 Gastro-esophageal reflux disease without esophagitis: Secondary | ICD-10-CM | POA: Diagnosis not present

## 2022-11-06 DIAGNOSIS — N1831 Chronic kidney disease, stage 3a: Secondary | ICD-10-CM | POA: Diagnosis not present

## 2022-11-11 DIAGNOSIS — E1169 Type 2 diabetes mellitus with other specified complication: Secondary | ICD-10-CM | POA: Diagnosis not present

## 2022-11-11 DIAGNOSIS — N1831 Chronic kidney disease, stage 3a: Secondary | ICD-10-CM | POA: Diagnosis not present

## 2022-11-11 DIAGNOSIS — D62 Acute posthemorrhagic anemia: Secondary | ICD-10-CM | POA: Diagnosis not present

## 2022-11-11 DIAGNOSIS — R109 Unspecified abdominal pain: Secondary | ICD-10-CM | POA: Diagnosis not present

## 2022-11-11 DIAGNOSIS — R030 Elevated blood-pressure reading, without diagnosis of hypertension: Secondary | ICD-10-CM | POA: Diagnosis not present

## 2022-11-11 DIAGNOSIS — D696 Thrombocytopenia, unspecified: Secondary | ICD-10-CM | POA: Diagnosis not present

## 2022-11-11 DIAGNOSIS — F17210 Nicotine dependence, cigarettes, uncomplicated: Secondary | ICD-10-CM | POA: Diagnosis not present

## 2022-11-11 DIAGNOSIS — Z23 Encounter for immunization: Secondary | ICD-10-CM | POA: Diagnosis not present

## 2022-11-11 DIAGNOSIS — I8500 Esophageal varices without bleeding: Secondary | ICD-10-CM | POA: Diagnosis not present

## 2022-11-11 DIAGNOSIS — K766 Portal hypertension: Secondary | ICD-10-CM | POA: Diagnosis not present

## 2022-11-11 DIAGNOSIS — I700 Atherosclerosis of aorta: Secondary | ICD-10-CM | POA: Diagnosis not present

## 2022-11-11 DIAGNOSIS — K760 Fatty (change of) liver, not elsewhere classified: Secondary | ICD-10-CM | POA: Diagnosis not present

## 2022-11-26 ENCOUNTER — Encounter (HOSPITAL_COMMUNITY): Payer: Self-pay | Admitting: Psychiatry

## 2022-11-26 ENCOUNTER — Telehealth (INDEPENDENT_AMBULATORY_CARE_PROVIDER_SITE_OTHER): Payer: 59 | Admitting: Psychiatry

## 2022-11-26 DIAGNOSIS — F3162 Bipolar disorder, current episode mixed, moderate: Secondary | ICD-10-CM

## 2022-11-26 MED ORDER — TRAZODONE HCL 50 MG PO TABS: 50.0000 mg | ORAL_TABLET | Freq: Every day | ORAL | 2 refills | Status: DC

## 2022-11-26 MED ORDER — PROPRANOLOL HCL ER 80 MG PO CP24: 80.0000 mg | ORAL_CAPSULE | Freq: Every day | ORAL | 2 refills | Status: DC

## 2022-11-26 MED ORDER — DIAZEPAM 2 MG PO TABS: 2.0000 mg | ORAL_TABLET | Freq: Every day | ORAL | 2 refills | Status: DC | PRN

## 2022-11-26 MED ORDER — BUPROPION HCL ER (XL) 150 MG PO TB24: 150.0000 mg | ORAL_TABLET | Freq: Every morning | ORAL | 2 refills | Status: DC

## 2022-11-26 NOTE — Progress Notes (Signed)
Virtual Visit via Video Note  I connected with Andrea Dickson on 11/26/22 at  9:00 AM EST by a video enabled telemedicine application and verified that I am speaking with the correct person using two identifiers.  Location: Patient: home Provider: office   I discussed the limitations of evaluation and management by telemedicine and the availability of in person appointments. The patient expressed understanding and agreed to proceed.     I discussed the assessment and treatment plan with the patient. The patient was provided an opportunity to ask questions and all were answered. The patient agreed with the plan and demonstrated an understanding of the instructions.   The patient was advised to call back or seek an in-person evaluation if the symptoms worsen or if the condition fails to improve as anticipated.  I provided 15 minutes of non-face-to-face time during this encounter.   Diannia Ruder, MD  [facility] MD/PA/NP OP Progress Note  11/26/2022 9:06 AM Andrea Dickson  MRN:  [id number]  Chief Complaint:  Chief Complaint  Patient presents with   Anxiety   Depression   Follow-up   HPI: This patient is a 73 year old separated white female who lives alone in [place]. She has 1 son and 3 grandchildren. She used to work as a Freight forwarder for [employer]. She has a history of depression anxiety and possible bipolar disorder.   The patient returns for follow-up after 3 months regarding her depression and anxiety.  She is still dealing with significant medical problems particularly the portal hypertension from her idiopathic liver cirrhosis.  She is now on 2 diuretics that she takes in the morning.  Since she was hospitalized back in September she has stopped smoking.  Consequently she is having a bit more anxiety and has had a lot of trouble getting to sleep at night.  The Valium 10 mg at bedtime is not helping.  However she denies being depressed and the Valium 2 mg during the day  seems to help anxiety.  I suggested that we add trazodone to her regimen to help with sleep and she agrees. Visit Diagnosis:    ICD-10-CM   1. Bipolar 1 disorder, mixed, moderate (HCC)  F31.62       Past Psychiatric History: Past outpatient treatment  Past Medical History:  Past Medical History:  Diagnosis Date   Anxiety    Asthma    Bipolar 1 disorder (HCC)    Chronic diarrhea    Cirrhosis (HCC)    Diagnosed September 2020, likely secondary to [facility], s/p Hep A/B vaccination in 2021.    Depression    Diabetes (HCC)    Essential tremor    GERD (gastroesophageal reflux disease)    Headache    High cholesterol     Past Surgical History:  Procedure Laterality Date   ABDOMINAL HYSTERECTOMY  1997   BIOPSY  01/17/2018   Procedure: BIOPSY;  Surgeon: Corbin Ade, MD;  [facility];  Service: Endoscopy;;  colon   carpal tunnel Bilateral 1999, 06/2009   COLONOSCOPY  2013   Dr. Teena Dunk: no colon polyps. quality of prep was fair. repeat colonoscopy in five years.    COLONOSCOPY WITH PROPOFOL N/A 01/17/2018   Dr. Jena Gauss: Colonic lipoma, 2 tubular adenomas removed.  Random colon biopsies negative.  Next colonoscopy 5 years.   COLONOSCOPY WITH PROPOFOL N/A 09/26/2022   Procedure: COLONOSCOPY WITH PROPOFOL;  Surgeon: Dolores Frame, MD;  [facility];  Service: Gastroenterology;  Laterality: N/A;  ESOPHAGOGASTRODUODENOSCOPY (EGD) WITH PROPOFOL N/A 04/06/2019   Procedure: ESOPHAGOGASTRODUODENOSCOPY (EGD) WITH PROPOFOL;  Surgeon: Corbin Ade, MD;   Normal esophagus, mild portal hypertensive gastropathy, otherwise normal exam.  Recommend repeat EGD in 2 years for variceal screening.    ESOPHAGOGASTRODUODENOSCOPY (EGD) WITH PROPOFOL N/A 01/19/2022   Procedure: ESOPHAGOGASTRODUODENOSCOPY (EGD) WITH PROPOFOL;  Surgeon: Corbin Ade, MD;  [facility];  Service: Endoscopy;  Laterality: N/A;  12:45 pm, pt knows to arrive at 9:15   ESOPHAGOGASTRODUODENOSCOPY  (EGD) WITH PROPOFOL N/A 09/25/2022   Procedure: ESOPHAGOGASTRODUODENOSCOPY (EGD) WITH PROPOFOL;  Surgeon: Dolores Frame, MD;  [facility];  Service: Gastroenterology;  Laterality: N/A;   POLYPECTOMY  01/17/2018   Procedure: POLYPECTOMY;  Surgeon: Corbin Ade, MD;  [facility];  Service: Endoscopy;;  colon   POLYPECTOMY  09/26/2022   Procedure: POLYPECTOMY;  Surgeon: Dolores Frame, MD;  [facility];  Service: Gastroenterology;;   SKIN CANCER EXCISION  2007   TONSILLECTOMY  1965   ulner nerve  Left 06/2009   decompression    Family Psychiatric History: See below  Family History:  Family History  Problem Relation Age of Onset   Diabetes Mother    Brain cancer Mother    Diabetes Father    Heart attack Father    Heart disease Father    Diabetes Sister    Bipolar disorder Brother    Diabetes Brother    Heart disease Brother    Colon cancer Neg Hx    Breast cancer Neg Hx     Social History:  Social History   Socioeconomic History   Marital status: Divorced    Spouse name: Not on file   Number of children: 1   Years of education: 12   Highest education level: Not on file  Occupational History   Occupation: retired    Comment: employed at [employer]  Tobacco Use   Smoking status: Every Day    Current packs/day: 0.50    Average packs/day: 0.5 packs/day for 43.0 years (21.5 ttl pk-yrs)    Types: Cigarettes   Smokeless tobacco: Never   Tobacco comments:    smoking since 72yrs old.    Vaping Use   Vaping status: Never Used  Substance and Sexual Activity   Alcohol use: No    Alcohol/week: 0.0 standard drinks of alcohol   Drug use: No   Sexual activity: Not Currently  Other Topics Concern   Not on file  Social History Narrative   Lives alone.  Retired.  Education 12th grade.  One child.     Caffeine use- sodas, 2 daily   Social Determinants of Health   Financial Resource Strain: Low Risk  (11/10/2021)    [facility], [facility]   Overall Financial Resource Strain (CARDIA)    Difficulty of Paying Living Expenses: Not hard at all  Food Insecurity: No Food Insecurity (09/25/2022)   Hunger Vital Sign    Worried About Running Out of Food in the Last Year: Never true    Ran Out of Food in the Last Year: Never true  Transportation Needs: No Transportation Needs (09/25/2022)   PRAPARE - Administrator, Civil Service (Medical): No    Lack of Transportation (Non-Medical): No  Physical Activity: Inactive (11/10/2021)   [facility], [facility]   Exercise Vital Sign    Days of Exercise per Week: 0 days  Minutes of Exercise per Session: 0 min  Stress: No Stress Concern Present (11/10/2021)   Received from [facility], [facility]   [facility] - Occupational Stress Questionnaire    Feeling of Stress : Not at all  Social Connections: Socially Isolated (11/10/2021)   Received from [facility], [facility]   Social Connection and Isolation Panel [NHANES]    Frequency of Communication with Friends and Family: More than three times a week    Frequency of Social Gatherings with Friends and Family: Once a week    Attends Religious Services: Never    Database administrator or Organizations: No    Attends Banker Meetings: Never    Marital Status: Divorced    Allergies:  Allergies  Allergen Reactions   Pramipexole Other (See Comments)    Shaking, palpitations, headache, faint feeling   Crestor [Rosuvastatin Calcium]     Muscle pain   Bee Pollen Other (See Comments)    Eyes and nose run   Pollen Extract Other (See Comments)    Eyes and nose run    Metabolic Disorder Labs: Lab Results  Component Value Date   HGBA1C 5.4 04/24/2015   MPG 108 04/24/2015   No results found for: "PROLACTIN" Lab Results  Component Value Date   CHOL 178 02/17/2016   TRIG 229 (H) 02/17/2016    HDL 30 (L) 02/17/2016   CHOLHDL 5.9 (H) 02/17/2016   VLDL 46 (H) 02/17/2016   LDLCALC 102 (H) 02/17/2016   LDLCALC 126 (H) 11/18/2015   Lab Results  Component Value Date   TSH 1.00 12/24/2021   TSH 2.96 08/29/2019    Therapeutic Level Labs: Lab Results  Component Value Date   LITHIUM 0.8 08/29/2019   LITHIUM 0.4 (L) 10/13/2018   Lab Results  Component Value Date   VALPROATE 62.2 08/20/2016   VALPROATE 83.0 10/14/2015   No results found for: "CBMZ"  Current Medications: Current Outpatient Medications  Medication Sig Dispense Refill   traZODone (DESYREL) 50 MG tablet Take 1 tablet (50 mg total) by mouth at bedtime. 30 tablet 2   acetaminophen (TYLENOL) 500 MG tablet Take 1,000 mg by mouth 2 (two) times daily as needed for moderate pain or headache.     buPROPion (WELLBUTRIN XL) 150 MG 24 hr tablet Take 1 tablet (150 mg total) by mouth every morning. 90 tablet 2   cetirizine (ZYRTEC) 10 MG tablet Take 10 mg by mouth daily. Aller-Tec     Cholecalciferol (VITAMIN D3) 50 MCG (2000 UT) TABS Take 2,000 Units by mouth daily.      diazepam (VALIUM) 10 MG tablet TAKE ONE TABLET BY MOUTH EVERYDAY AT BEDTIME 60 tablet 2   diazepam (VALIUM) 2 MG tablet Take 1 tablet (2 mg total) by mouth daily as needed for anxiety. 30 tablet 2   estrogens, conjugated, (PREMARIN) 1.25 MG tablet Take 1.25 mg by mouth daily.      ezetimibe (ZETIA) 10 MG tablet Take 10 mg by mouth daily.     fluticasone (FLONASE) 50 MCG/ACT nasal spray Place 2 sprays into both nostrils daily.     furosemide (LASIX) 20 MG tablet Take 1 tablet (20 mg total) by mouth daily. 90 tablet 3   hyoscyamine (LEVSIN SL) 0.125 MG SL tablet Place 1 tablet (0.125 mg total) under the tongue every 6 (six) hours as needed for cramping. 30 tablet 1   Lactobacillus-Inulin (CULTURELLE DIGESTIVE HEALTH) CAPS Take 1 capsule by mouth  daily.     nicotine (NICODERM CQ - DOSED IN MG/24 HOURS) 14 mg/24hr patch Place 1 patch (14 mg total) onto the skin  daily. 28 patch 0   omeprazole (PRILOSEC) 40 MG capsule TAKE 1 CAPSULE BY MOUTH AT BREAKFAST AND AT BEDTIME *REFILL REQUEST* 60 capsule 10   ondansetron (ZOFRAN) 4 MG tablet TAKE 1 TABLET BY MOUTH EVERY 8 HOURS AS NEEDED FOR NAUSEA & VOMITING 30 tablet 3   Pancrelipase, Lip-Prot-Amyl, (ZENPEP) 16109-604540 units CPEP TAKE 1 CAPSULE BY MOUTH THREE TIMES DAILY WITH MEALS 100 capsule 10   propranolol ER (INDERAL LA) 80 MG 24 hr capsule Take 1 capsule (80 mg total) by mouth daily. 30 capsule 2   spironolactone (ALDACTONE) 50 MG tablet TAKE 1 TABLET BY MOUTH EVERY MORNING *TAKE WITH FUROSEMIDE 20MG  30 tablet 10   No current facility-administered medications for this visit.     Musculoskeletal: Strength & Muscle Tone: within normal limits Gait & Station: normal Patient leans: N/A  Psychiatric Specialty Exam: Review of Systems  Gastrointestinal:  Positive for abdominal distention.  Psychiatric/Behavioral:  Positive for sleep disturbance.   All other systems reviewed and are negative.   There were no vitals taken for this visit.There is no height or weight on file to calculate BMI.  General Appearance: Casual and Fairly Groomed  Eye Contact:  Good  Speech:  Clear and Coherent  Volume:  Normal  Mood:  Euthymic  Affect:  Congruent  Thought Process:  Goal Directed  Orientation:  Full (Time, Place, and Person)  Thought Content: WDL   Suicidal Thoughts:  No  Homicidal Thoughts:  No  Memory:  Immediate;   Good Recent;   Good Remote;   Good  Judgement:  Good  Insight:  Fair  Psychomotor Activity:  Decreased  Concentration:  Concentration: Good and Attention Span: Good  Recall:  Good  Fund of Knowledge: Good  Language: Good  Akathisia:  No  Handed:  Right  AIMS (if indicated): not done  Assets:  Communication Skills Desire for Improvement Resilience Social Support  ADL's:  Intact  Cognition: WNL  Sleep:  Poor   Screenings: PHQ2-9    Flowsheet Row Video Visit from 09/30/2021  in [facility] [place] Video Visit from 06/26/2021 in [facility] [place] Video Visit from 03/26/2021 in [facility] [place] Video Visit from 12/18/2020 in [facility] at [place] Video Visit from 09/24/2020 in [facility] Total Score 0 0 0 0 0      Flowsheet Row ED to Hosp-Admission (Discharged) from 09/24/2022 in [place] [facility] ED from 07/25/2022 in [facility] Emergency Department at [facility] Admission (Discharged) from 01/19/2022 in [place] [facility]  C-SSRS RISK CATEGORY No Risk No Risk No Risk        Assessment and Plan: This patient is a 72 year old female with a history of bipolar disorder anxiety and tremor.  She is doing well on her current regimen although she is not sleeping well.  We will add trazodone 50 mg at bedtime.  For now she will hold off on the Valium 10 mg at bedtime.  She will continue Wellbutrin XL 150 mg every morning for depression, Valium 2 mg once daily as needed for anxiety and Inderal LA 80 mg daily for tremor.  She will return to see me in 4 weeks  Collaboration of Care: Collaboration of Care:  Other provider involved in patient's care AEB notes are shared with GI on the epic system  Patient/Guardian was advised Release of Information must be obtained prior to any record release in order to collaborate their care with an outside provider. Patient/Guardian was advised if they have not already done so to contact the registration department to sign all necessary forms in order for [place] to release information regarding their care.   Consent: Patient/Guardian gives verbal consent for treatment and assignment of benefits for services provided during this visit. Patient/Guardian expressed understanding and agreed to proceed.    Diannia Ruder, MD 11/26/2022, 9:07  AM

## 2022-11-27 ENCOUNTER — Telehealth: Payer: Self-pay | Admitting: *Deleted

## 2022-11-27 NOTE — Telephone Encounter (Signed)
Pt called and states she is having some swelling. She states she has gained 6 lbs in a day. Her legs and feet are swelling. She states she is taking her medications as directed. I advised her if it became difficult to breath and she continue to swell to go to ED. She voiced understanding.

## 2022-11-28 ENCOUNTER — Other Ambulatory Visit (HOSPITAL_COMMUNITY): Payer: Self-pay | Admitting: Psychiatry

## 2022-12-16 ENCOUNTER — Other Ambulatory Visit (HOSPITAL_COMMUNITY): Payer: Self-pay | Admitting: Psychiatry

## 2022-12-17 ENCOUNTER — Ambulatory Visit: Payer: Medicare Other | Admitting: Gastroenterology

## 2022-12-17 DIAGNOSIS — Z961 Presence of intraocular lens: Secondary | ICD-10-CM | POA: Diagnosis not present

## 2022-12-17 DIAGNOSIS — E119 Type 2 diabetes mellitus without complications: Secondary | ICD-10-CM | POA: Diagnosis not present

## 2022-12-22 DIAGNOSIS — Z6823 Body mass index (BMI) 23.0-23.9, adult: Secondary | ICD-10-CM | POA: Diagnosis not present

## 2022-12-22 DIAGNOSIS — J069 Acute upper respiratory infection, unspecified: Secondary | ICD-10-CM | POA: Diagnosis not present

## 2022-12-22 DIAGNOSIS — F17210 Nicotine dependence, cigarettes, uncomplicated: Secondary | ICD-10-CM | POA: Diagnosis not present

## 2022-12-23 ENCOUNTER — Other Ambulatory Visit (HOSPITAL_COMMUNITY): Payer: Self-pay | Admitting: Psychiatry

## 2022-12-23 ENCOUNTER — Telehealth (HOSPITAL_COMMUNITY): Payer: Self-pay | Admitting: *Deleted

## 2022-12-23 MED ORDER — PROPRANOLOL HCL ER 80 MG PO CP24: 80.0000 mg | ORAL_CAPSULE | Freq: Every day | ORAL | 2 refills | Status: DC

## 2022-12-23 MED ORDER — DIAZEPAM 2 MG PO TABS: 2.0000 mg | ORAL_TABLET | Freq: Every day | ORAL | 2 refills | Status: DC | PRN

## 2022-12-23 NOTE — Telephone Encounter (Signed)
Patient called and left message stating she would like refills for her Propranolol and the Diazepam. Per patient she would like for medications to be sent to [facility].   Patient f/u appt is 12/30/2022

## 2022-12-29 ENCOUNTER — Other Ambulatory Visit: Payer: Self-pay | Admitting: Gastroenterology

## 2022-12-30 ENCOUNTER — Telehealth (HOSPITAL_COMMUNITY): Payer: 59 | Admitting: Psychiatry

## 2022-12-30 ENCOUNTER — Encounter (HOSPITAL_COMMUNITY): Payer: Self-pay | Admitting: Gastroenterology

## 2022-12-30 DIAGNOSIS — G251 Drug-induced tremor: Secondary | ICD-10-CM | POA: Diagnosis not present

## 2022-12-30 DIAGNOSIS — F3162 Bipolar disorder, current episode mixed, moderate: Secondary | ICD-10-CM

## 2022-12-30 MED ORDER — DIAZEPAM 2 MG PO TABS: 2.0000 mg | ORAL_TABLET | Freq: Every day | ORAL | 2 refills | Status: DC | PRN

## 2022-12-30 MED ORDER — DIAZEPAM 10 MG PO TABS: ORAL_TABLET | ORAL | 2 refills | Status: DC

## 2022-12-30 MED ORDER — PROPRANOLOL HCL ER 80 MG PO CP24: 80.0000 mg | ORAL_CAPSULE | Freq: Every day | ORAL | 2 refills | Status: DC

## 2022-12-30 MED ORDER — BUPROPION HCL ER (XL) 150 MG PO TB24: 150.0000 mg | ORAL_TABLET | Freq: Every morning | ORAL | 2 refills | Status: DC

## 2022-12-30 NOTE — Progress Notes (Signed)
Virtual Visit via Video Note  I connected with Andrea Dickson on 12/30/22 at  1:40 PM EST by a video enabled telemedicine application and verified that I am speaking with the correct person using two identifiers.  Location: Patient: home Provider: office   I discussed the limitations of evaluation and management by telemedicine and the availability of in person appointments. The patient expressed understanding and agreed to proceed.     I discussed the assessment and treatment plan with the patient. The patient was provided an opportunity to ask questions and all were answered. The patient agreed with the plan and demonstrated an understanding of the instructions.   The patient was advised to call back or seek an in-person evaluation if the symptoms worsen or if the condition fails to improve as anticipated.  I provided 20 minutes of non-face-to-face time during this encounter.   Diannia Ruder, MD  [facility] MD/PA/NP OP Progress Note  12/30/2022 1:55 PM Andrea Dickson  MRN:  [id number]  Chief Complaint:  Chief Complaint  Patient presents with   Anxiety   Depression   Follow-up   HPI: : This patient is a 72 year old separated white female who lives alone in [place]. She has 1 son and 3 grandchildren. She used to work as a Freight forwarder for [employer]. She has a history of depression anxiety and possible bipolar disorder.  The patient returns for follow-up after 2 weeks regarding her depression and anxiety, insomnia and tremor.  Last time she was not sleeping well so we added trazodone to her regimen.  She states that it kept her sleep but she felt "strange" the next day and she stopped it.  The value puts her to sleep but she tends to wake up in the middle of the night and cannot get back to sleep.  I suggested that she take the Valium at bedtime and take to get another 1 if needed in the middle of the night.  She is willing to try this.  She states that her mood has been  stable and she denies significant depression anxiety or any manic symptoms such as racing thoughts or agitation.  She denies any thoughts of self-harm or suicide.  Her tremor has improved a lot with the Inderal.  Visit Diagnosis:    ICD-10-CM   1. Bipolar 1 disorder, mixed, moderate (HCC)  F31.62     2. Medication-induced postural tremor  G25.1       Past Psychiatric History: Past outpatient treatment  Past Medical History:  Past Medical History:  Diagnosis Date   Anxiety    Asthma    Bipolar 1 disorder (HCC)    Chronic diarrhea    Cirrhosis (HCC)    Diagnosed September 2020, likely secondary to [facility], s/p Hep A/B vaccination in 2021.    Depression    Diabetes (HCC)    Essential tremor    GERD (gastroesophageal reflux disease)    Headache    High cholesterol     Past Surgical History:  Procedure Laterality Date   ABDOMINAL HYSTERECTOMY  1997   BIOPSY  01/17/2018   Procedure: BIOPSY;  Surgeon: Corbin Ade, MD;  [facility];  Service: Endoscopy;;  colon   carpal tunnel Bilateral 1999, 06/2009   COLONOSCOPY  2013   Dr. Teena Dunk: no colon polyps. quality of prep was fair. repeat colonoscopy in five years.    COLONOSCOPY WITH PROPOFOL N/A 01/17/2018   Dr. Jena Gauss: Colonic lipoma, 2 tubular adenomas removed.  Random  colon biopsies negative.  Next colonoscopy 5 years.   COLONOSCOPY WITH PROPOFOL N/A 09/26/2022   Procedure: COLONOSCOPY WITH PROPOFOL;  Surgeon: Dolores Frame, MD;  [facility];  Service: Gastroenterology;  Laterality: N/A;   ESOPHAGOGASTRODUODENOSCOPY (EGD) WITH PROPOFOL N/A 04/06/2019   Procedure: ESOPHAGOGASTRODUODENOSCOPY (EGD) WITH PROPOFOL;  Surgeon: Corbin Ade, MD;   Normal esophagus, mild portal hypertensive gastropathy, otherwise normal exam.  Recommend repeat EGD in 2 years for variceal screening.    ESOPHAGOGASTRODUODENOSCOPY (EGD) WITH PROPOFOL N/A 01/19/2022   Procedure: ESOPHAGOGASTRODUODENOSCOPY (EGD) WITH PROPOFOL;   Surgeon: Corbin Ade, MD;  [facility];  Service: Endoscopy;  Laterality: N/A;  12:45 pm, pt knows to arrive at 9:15   ESOPHAGOGASTRODUODENOSCOPY (EGD) WITH PROPOFOL N/A 09/25/2022   Procedure: ESOPHAGOGASTRODUODENOSCOPY (EGD) WITH PROPOFOL;  Surgeon: Dolores Frame, MD;  [facility];  Service: Gastroenterology;  Laterality: N/A;   POLYPECTOMY  01/17/2018   Procedure: POLYPECTOMY;  Surgeon: Corbin Ade, MD;  [facility];  Service: Endoscopy;;  colon   POLYPECTOMY  09/26/2022   Procedure: POLYPECTOMY;  Surgeon: Dolores Frame, MD;  [facility];  Service: Gastroenterology;;   SKIN CANCER EXCISION  2007   TONSILLECTOMY  1965   ulner nerve  Left 06/2009   decompression    Family Psychiatric History: See below  Family History:  Family History  Problem Relation Age of Onset   Diabetes Mother    Brain cancer Mother    Diabetes Father    Heart attack Father    Heart disease Father    Diabetes Sister    Bipolar disorder Brother    Diabetes Brother    Heart disease Brother    Colon cancer Neg Hx    Breast cancer Neg Hx     Social History:  Social History   Socioeconomic History   Marital status: Divorced    Spouse name: Not on file   Number of children: 1   Years of education: 12   Highest education level: Not on file  Occupational History   Occupation: retired    Comment: employed at [employer]  Tobacco Use   Smoking status: Every Day    Current packs/day: 0.50    Average packs/day: 0.5 packs/day for 43.0 years (21.5 ttl pk-yrs)    Types: Cigarettes   Smokeless tobacco: Never   Tobacco comments:    smoking since 72yrs old.    Vaping Use   Vaping status: Never Used  Substance and Sexual Activity   Alcohol use: No    Alcohol/week: 0.0 standard drinks of alcohol   Drug use: No   Sexual activity: Not Currently  Other Topics Concern   Not on file  Social History Narrative   Lives alone.   Retired.  Education 12th grade.  One child.     Caffeine use- sodas, 2 daily   Social Drivers of Health   Financial Resource Strain: Low Risk  (11/10/2021)   Received from [facility], [facility]   Overall Financial Resource Strain (CARDIA)    Difficulty of Paying Living Expenses: Not hard at all  Food Insecurity: No Food Insecurity (09/25/2022)   Hunger Vital Sign    Worried About Running Out of Food in the Last Year: Never true    Ran Out of Food in the Last Year: Never true  Transportation Needs: No Transportation Needs (09/25/2022)   PRAPARE - Administrator, Civil Service (Medical): No  Lack of Transportation (Non-Medical): No  Physical Activity: Inactive (11/10/2021)   Received from [facility], [facility]   Exercise Vital Sign    Days of Exercise per Week: 0 days    Minutes of Exercise per Session: 0 min  Stress: No Stress Concern Present (11/10/2021)   Received from [facility], [facility] - Occupational Stress Questionnaire    Feeling of Stress : Not at all  Social Connections: Socially Isolated (11/10/2021)   Received [facility], [facility]   Social Connection and Isolation Panel [NHANES]    Frequency of Communication with Friends and Family: More than three times a week    Frequency of Social Gatherings with Friends and Family: Once a week    Attends Religious Services: Never    Database administrator or Organizations: No    Attends Banker Meetings: Never    Marital Status: Divorced    Allergies:  Allergies  Allergen Reactions   Pramipexole Other (See Comments)    Shaking, palpitations, headache, faint feeling   Crestor [Rosuvastatin Calcium]     Muscle pain   Bee Pollen Other (See Comments)    Eyes and nose run   Pollen Extract Other (See Comments)    Eyes and nose run    Metabolic Disorder Labs: Lab Results  Component Value Date   HGBA1C 5.4  04/24/2015   MPG 108 04/24/2015   No results found for: "PROLACTIN" Lab Results  Component Value Date   CHOL 178 02/17/2016   TRIG 229 (H) 02/17/2016   HDL 30 (L) 02/17/2016   CHOLHDL 5.9 (H) 02/17/2016   VLDL 46 (H) 02/17/2016   LDLCALC 102 (H) 02/17/2016   LDLCALC 126 (H) 11/18/2015   Lab Results  Component Value Date   TSH 1.00 12/24/2021   TSH 2.96 08/29/2019    Therapeutic Level Labs: Lab Results  Component Value Date   LITHIUM 0.8 08/29/2019   LITHIUM 0.4 (L) 10/13/2018   Lab Results  Component Value Date   VALPROATE 62.2 08/20/2016   VALPROATE 83.0 10/14/2015   No results found for: "CBMZ"  Current Medications: Current Outpatient Medications  Medication Sig Dispense Refill   acetaminophen (TYLENOL) 500 MG tablet Take 1,000 mg by mouth 2 (two) times daily as needed for moderate pain or headache.     buPROPion (WELLBUTRIN XL) 150 MG 24 hr tablet Take 1 tablet (150 mg total) by mouth every morning. 90 tablet 2   cetirizine (ZYRTEC) 10 MG tablet Take 10 mg by mouth daily. Aller-Tec     Cholecalciferol (VITAMIN D3) 50 MCG (2000 UT) TABS Take 2,000 Units by mouth daily.      diazepam (VALIUM) 10 MG tablet Take one or two at bedtime 60 tablet 2   diazepam (VALIUM) 2 MG tablet Take 1 tablet (2 mg total) by mouth daily as needed for anxiety. 30 tablet 2   estrogens, conjugated, (PREMARIN) 1.25 MG tablet Take 1.25 mg by mouth daily.      ezetimibe (ZETIA) 10 MG tablet Take 10 mg by mouth daily.     fluticasone (FLONASE) 50 MCG/ACT nasal spray Place 2 sprays into both nostrils daily.     furosemide (LASIX) 20 MG tablet Take 1 tablet (20 mg total) by mouth daily. 90 tablet 3   hyoscyamine (LEVSIN SL) 0.125 MG SL tablet Place 1 tablet (0.125 mg total) under the tongue every 6 (six) hours as needed for  cramping. 30 tablet 1   Lactobacillus-Inulin (CULTURELLE DIGESTIVE HEALTH) CAPS Take 1 capsule by mouth daily.     nicotine (NICODERM CQ - DOSED IN MG/24 HOURS) 14 mg/24hr  patch Place 1 patch (14 mg total) onto the skin daily. 28 patch 0   omeprazole (PRILOSEC) 40 MG capsule TAKE 1 CAPSULE BY MOUTH AT BREAKFAST AND AT BEDTIME *REFILL REQUEST* 60 capsule 10   ondansetron (ZOFRAN) 4 MG tablet TAKE 1 TABLET BY MOUTH EVERY 8 HOURS AS NEEDED FOR NAUSEA & VOMITING 30 tablet 10   Pancrelipase, Lip-Prot-Amyl, (ZENPEP) 40981-191478 units CPEP TAKE 1 CAPSULE BY MOUTH THREE TIMES DAILY WITH MEALS 100 capsule 10   propranolol ER (INDERAL LA) 80 MG 24 hr capsule Take 1 capsule (80 mg total) by mouth daily. 90 capsule 2   spironolactone (ALDACTONE) 50 MG tablet TAKE 1 TABLET BY MOUTH EVERY MORNING *TAKE WITH FUROSEMIDE 20MG  30 tablet 10   No current facility-administered medications for this visit.     Musculoskeletal: Strength & Muscle Tone: within normal limits Gait & Station: normal Patient leans: N/A  Psychiatric Specialty Exam: Review of Systems  Psychiatric/Behavioral:  Positive for sleep disturbance.   All other systems reviewed and are negative.   There were no vitals taken for this visit.There is no height or weight on file to calculate BMI.  General Appearance: Casual and Fairly Groomed  Eye Contact:  Good  Speech:  Clear and Coherent  Volume:  Normal  Mood:  Euthymic  Affect:  Congruent  Thought Process:  Goal Directed  Orientation:  Full (Time, Place, and Person)  Thought Content: WDL   Suicidal Thoughts:  No  Homicidal Thoughts:  No  Memory:  Immediate;   Good Recent;   Good Remote;   Good  Judgement:  Good  Insight:  Good  Psychomotor Activity:  Tremor  Concentration:  Concentration: Good and Attention Span: Good  Recall:  Good  Fund of Knowledge: Good  Language: Good  Akathisia:  No  Handed:  Right  AIMS (if indicated): not done  Assets:  Communication Skills Desire for Improvement Resilience Social Support Talents/Skills  ADL's:  Intact  Cognition: WNL  Sleep:  Fair   Screenings: PHQ2-9    Flowsheet Row Video Visit from  09/30/2021 in [facility] [place] Video Visit from 06/26/2021 in [facility] [place] Video Visit from 03/26/2021 in [facility] at [place] Video Visit from 12/18/2020 in [facility] at [place] Video Visit from 09/24/2020 in [facility] Total Score 0 0 0 0 0      Flowsheet Row ED to Hosp-Admission (Discharged) from 09/24/2022 in [place] ED from 07/25/2022 in [facility] Department at [facility] Admission (Discharged) from 01/19/2022 in [place] [facility]  C-SSRS RISK CATEGORY No Risk No Risk No Risk        Assessment and Plan: This patient is a 72 year old female with a history of bipolar disorder anxiety and tremor.  She is not sleeping well but did not tolerate the trazodone.  We will increase Valium to 10 to 20 mg at bedtime.  Or she can take 10 at bedtime and another 10 in the middle of the night.  She will continue Wellbutrin XL 150 mg every morning for depression Valium 2 mg once daily as needed for anxiety and Inderal LA 80 mg daily for tremor.  She will return  to see me in 3 months  Collaboration of Care: Collaboration of Care: Primary Care Provider AEB notes will be shared with PCP at patient's request  Patient/Guardian was advised Release of Information must be obtained prior to any record release in order to collaborate their care with an outside provider. Patient/Guardian was advised if they have not already done so to contact the registration department to sign all necessary forms in order for [place] to release information regarding their care.   Consent: Patient/Guardian gives verbal consent for treatment and assignment of benefits for services provided during this visit. Patient/Guardian expressed understanding and agreed to proceed.    Diannia Ruder, MD 12/30/2022,  1:55 PM

## 2023-01-01 NOTE — Progress Notes (Signed)
 Name: Andrea Dickson DOB: October 20, 1950 MRN: [id number]  History of Present Illness: [name] is a 72 y.o. female who presents today for follow up visit at [facility]. - GU history: 1. Recurrent UTI. - Risk factors include chronic diarrhea and likely vaginal atrophy.   Urine culture results in past 12 months: - 03/18/2022: Positive for E. Coli - 04/02/2022: Negative - 07/14/2022: Positive for Proteus mirabilis    At last visit on 07/14/2022: - PVR appeared elevated initially (321 ml) but was normal when rechecked via I&O cath (75 ml). We discussed likely cause for discrepancy is her known ascites.  - Discussed topical vaginal estrogen for likely vaginal atrophy / UTI prevention; agreed to hold off on that. - The plan was: 1. Urine culture. 2. Discontinue Nitrofurantoin  due to pulmonary fibrosis risk. 3. Start daily low dose Trimethoprim  for UTI prophylaxis. 3. Return in about 6 months (around 01/14/2023) for UA, PVR, & f/u with Andrea Oz NP.  Her urine culture came back positive for Proteus mirabilis with resistance to Nitrofurantoin  (which explained why the low dose Nitrofurantoin  she had been taking daily for UTI prophylaxis was ineffective for that pathogen). Treated with Augmentin . Advised to continue daily low dose Trimethoprim  for UTI prophylaxis after Augmentin  course.  Since last visit: > 07/25/2022: CT abdomen/pelvis w/ contrast showed no GU stones, masses, or hydronephrosis; bladder unremarkable.  > 08/17/2022: CT angio abdomen/pelvis was unremarkable from a urologic standpoint.  Today: She reports 1 UTI since last visit. She attributes the decline in UTI frequency to both the daily low dose Trimethoprim  and decreased chronic diarrhea since her GI provider started her on Zenpep .   She is currently taking an acute course of Amoxicillin  as prescribed by her PCP for sinusitis.   She denies acute UTI symptoms today. Denies increased urinary urgency,  frequency, dysuria, gross hematuria, straining to void, or sensations of incomplete emptying. Denies flank pain or abdominal pain.  She has urinary frequency due to diuretic use for her cirrhosis, which she states has been very helpful for that problem.    Fall Screening: Do you usually have a device to assist in your mobility? No   Medications: Current Outpatient Medications  Medication Sig Dispense Refill   acetaminophen  (TYLENOL ) 500 MG tablet Take 1,000 mg by mouth 2 (two) times daily as needed for moderate pain or headache.     amoxicillin  (AMOXIL ) 500 MG capsule Take 1,000 mg by mouth 3 (three) times daily.     buPROPion  (WELLBUTRIN  XL) 150 MG 24 hr tablet Take 1 tablet (150 mg total) by mouth every morning. 90 tablet 2   cetirizine (ZYRTEC) 10 MG tablet Take 10 mg by mouth daily. Aller-Tec     Cholecalciferol (VITAMIN D3) 50 MCG (2000 UT) TABS Take 2,000 Units by mouth daily.      diazepam  (VALIUM ) 10 MG tablet Take one or two at bedtime 60 tablet 2   diazepam  (VALIUM ) 2 MG tablet Take 1 tablet (2 mg total) by mouth daily as needed for anxiety. 30 tablet 2   estrogens , conjugated, (PREMARIN ) 1.25 MG tablet Take 1.25 mg by mouth daily.      ezetimibe  (ZETIA ) 10 MG tablet Take 10 mg by mouth daily.     fluticasone (FLONASE) 50 MCG/ACT nasal spray Place 2 sprays into both nostrils daily.     furosemide  (LASIX ) 20 MG tablet Take 1 tablet (20 mg total) by mouth daily. 90 tablet 3   hyoscyamine  (LEVSIN  SL) 0.125 MG SL tablet  Place 1 tablet (0.125 mg total) under the tongue every 6 (six) hours as needed for cramping. 30 tablet 1   Lactobacillus-Inulin (CULTURELLE DIGESTIVE HEALTH) CAPS Take 1 capsule by mouth daily.     nicotine  (NICODERM CQ  - DOSED IN MG/24 HOURS) 14 mg/24hr patch Place 1 patch (14 mg total) onto the skin daily. 28 patch 0   omeprazole  (PRILOSEC) 40 MG capsule TAKE 1 CAPSULE BY MOUTH AT BREAKFAST AND AT BEDTIME *REFILL REQUEST* 60 capsule 10   ondansetron  (ZOFRAN ) 4 MG  tablet TAKE 1 TABLET BY MOUTH EVERY 8 HOURS AS NEEDED FOR NAUSEA & VOMITING 30 tablet 10   Pancrelipase , Lip-Prot-Amyl, (ZENPEP ) 60000-189600 units CPEP TAKE 1 CAPSULE BY MOUTH THREE TIMES DAILY WITH MEALS 100 capsule 10   propranolol  ER (INDERAL  LA) 80 MG 24 hr capsule Take 1 capsule (80 mg total) by mouth daily. 90 capsule 2   spironolactone  (ALDACTONE ) 50 MG tablet TAKE 1 TABLET BY MOUTH EVERY MORNING *TAKE WITH FUROSEMIDE  20MG  30 tablet 10   trimethoprim  (TRIMPEX ) 100 MG tablet Take 1 tablet (100 mg total) by mouth at bedtime. 30 tablet 11   No current facility-administered medications for this visit.    Allergies: Allergies  Allergen Reactions   Pramipexole Other (See Comments)    Shaking, palpitations, headache, faint feeling   Crestor [Rosuvastatin Calcium ]     Muscle pain   Bee Pollen Other (See Comments)    Eyes and nose run   Pollen Extract Other (See Comments)    Eyes and nose run    Past Medical History:  Diagnosis Date   Anxiety    Asthma    Bipolar 1 disorder (HCC)    Chronic diarrhea    Cirrhosis (HCC)    Diagnosed September 2020, likely secondary to [facility], s/p Hep A/B vaccination in 2021.    Depression    Diabetes (HCC)    Essential tremor    GERD (gastroesophageal reflux disease)    Headache    High cholesterol    Past Surgical History:  Procedure Laterality Date   ABDOMINAL HYSTERECTOMY  1997   BIOPSY  01/17/2018   Procedure: BIOPSY;  Surgeon: Shaaron Lamar HERO, MD;  [facility];  Service: Endoscopy;;  colon   carpal tunnel Bilateral 1999, 06/2009   COLONOSCOPY  2013   Dr. Donnel: no colon polyps. quality of prep was fair. repeat colonoscopy in five years.    COLONOSCOPY WITH PROPOFOL  N/A 01/17/2018   Dr. Shaaron: Colonic lipoma, 2 tubular adenomas removed.  Random colon biopsies negative.  Next colonoscopy 5 years.   COLONOSCOPY WITH PROPOFOL  N/A 09/26/2022   Procedure: COLONOSCOPY WITH PROPOFOL ;  Surgeon: Eartha Angelia Sieving, MD;  [facility]:  [facility];  Service: Gastroenterology;  Laterality: N/A;   ESOPHAGOGASTRODUODENOSCOPY (EGD) WITH PROPOFOL  N/A 04/06/2019   Procedure: ESOPHAGOGASTRODUODENOSCOPY (EGD) WITH PROPOFOL ;  Surgeon: Shaaron Lamar HERO, MD;   Normal esophagus, mild portal hypertensive gastropathy, otherwise normal exam.  Recommend repeat EGD in 2 years for variceal screening.    ESOPHAGOGASTRODUODENOSCOPY (EGD) WITH PROPOFOL  N/A 01/19/2022   Procedure: ESOPHAGOGASTRODUODENOSCOPY (EGD) WITH PROPOFOL ;  Surgeon: Shaaron Lamar HERO, MD;  [facility]: [facility];  Service: Endoscopy;  Laterality: N/A;  12:45 pm, pt knows to arrive at 9:15   ESOPHAGOGASTRODUODENOSCOPY (EGD) WITH PROPOFOL  N/A 09/25/2022   Procedure: ESOPHAGOGASTRODUODENOSCOPY (EGD) WITH PROPOFOL ;  Surgeon: Eartha Angelia Sieving, MD;  [facility];  Service: Gastroenterology;  Laterality: N/A;   POLYPECTOMY  01/17/2018   Procedure: POLYPECTOMY;  Surgeon: Shaaron Lamar  M, MD;  [facility];  Service: Endoscopy;;  colon   POLYPECTOMY  09/26/2022   Procedure: POLYPECTOMY;  Surgeon: Eartha Angelia Sieving, MD;  [facility];  Service: Gastroenterology;;   SKIN CANCER EXCISION  2007   TONSILLECTOMY  1965   ulner nerve  Left 06/2009   decompression   Family History  Problem Relation Age of Onset   Diabetes Mother    Brain cancer Mother    Diabetes Father    Heart attack Father    Heart disease Father    Diabetes Sister    Bipolar disorder Brother    Diabetes Brother    Heart disease Brother    Colon cancer Neg Hx    Breast cancer Neg Hx    Social History   Socioeconomic History   Marital status: Divorced    Spouse name: Not on file   Number of children: 1   Years of education: 12   Highest education level: Not on file  Occupational History   Occupation: retired    Comment: employed at [employer]  Tobacco Use   Smoking status: Every Day    Current packs/day: 0.50    Average packs/day: 0.5 packs/day for 43.0  years (21.5 ttl pk-yrs)    Types: Cigarettes   Smokeless tobacco: Never   Tobacco comments:    smoking since 72yrs old.    Vaping Use   Vaping status: Never Used  Substance and Sexual Activity   Alcohol use: No    Alcohol/week: 0.0 standard drinks of alcohol   Drug use: No   Sexual activity: Not Currently  Other Topics Concern   Not on file  Social History Narrative   Lives alone.  Retired.  Education 12th grade.  One child.     Caffeine use- sodas, 2 daily   Social Drivers of Health   Financial Resource Strain: Low Risk  (11/10/2021)   Received from [facility], [facility]   Overall Financial Resource Strain (CARDIA)    Difficulty of Paying Living Expenses: Not hard at all  Food Insecurity: No Food Insecurity (09/25/2022)   Hunger Vital Sign    Worried About Running Out of Food in the Last Year: Never true    Ran Out of Food in the Last Year: Never true  Transportation Needs: No Transportation Needs (09/25/2022)   PRAPARE - Administrator, Civil Service (Medical): No    Lack of Transportation (Non-Medical): No  Physical Activity: Inactive (11/10/2021)   Received from [facility], [facility]   Exercise Vital Sign    Days of Exercise per Week: 0 days    Minutes of Exercise per Session: 0 min  Stress: No Stress Concern Present (11/10/2021)   Received from [facility], [facility] Occupational Health - Occupational Stress Questionnaire    Feeling of Stress : Not at all  Social Connections: Socially Isolated (11/10/2021)   Received from [facility], [facility]   Social Connection and Isolation Panel [NHANES]    Frequency of Communication with Friends and Family: More than three times a week    Frequency of Social Gatherings with Friends and Family: Once a week    Attends Religious Services: Never    Database Administrator or Organizations: No    Attends Banker Meetings: Never    Marital  Status: Divorced  Catering Manager Violence: Not At Risk (09/25/2022)  Humiliation, Afraid, Rape, and Kick questionnaire    Fear of Current or Ex-Partner: No    Emotionally Abused: No    Physically Abused: No    Sexually Abused: No    Review of Systems Constitutional: Patient denies any unintentional weight loss or change in strength lntegumentary: Patient denies any rashes or pruritus Cardiovascular: Patient denies chest pain or syncope Respiratory: Patient denies shortness of breath Gastrointestinal: Patient denies nausea, vomiting, constipation, or diarrhea Musculoskeletal: Patient denies muscle cramps or weakness Neurologic: Patient denies convulsions or seizures Allergic/Immunologic: Patient denies recent allergic reaction(s) Hematologic/Lymphatic: Patient denies bleeding tendencies Endocrine: Patient denies heat/cold intolerance  GU: As per HPI.  OBJECTIVE Vitals:   01/14/23 1048  BP: 131/76  Pulse: (!) 59   There is no height or weight on file to calculate BMI.  Physical Examination Constitutional: No obvious distress; patient is non-toxic appearing  Cardiovascular: No visible lower extremity edema.  Respiratory: The patient does not have audible wheezing/stridor; respirations do not appear labored  Gastrointestinal: Abdomen non-distended Musculoskeletal: Normal ROM of UEs  Skin: No obvious rashes/open sores  Neurologic: CN 2-12 grossly intact Psychiatric: Answered questions appropriately with normal affect  Hematologic/Lymphatic/Immunologic: No obvious bruises or sites of spontaneous bleeding  Urine microscopy: 11-30 WBC/hpf, 0-2 RBC/hpf, many bacteria PVR: 98 ml  ASSESSMENT Recurrent UTI - Plan: Urinalysis, Routine w reflex microscopic, BLADDER SCAN AMB NON-IMAGING, trimethoprim  (TRIMPEX ) 100 MG tablet  Asymptomatic for acute UTI today. We agreed to continue Trimethoprim  250 mg daily for UTI prophylaxis. She was advised to hold Trimethoprim  while taking the  Amoxicillin  then restart after that is completed. Will plan for follow up in 6 months or sooner if needed. Pt verbalized understanding and agreement. All questions were answered.  PLAN Advised the following: 1. Continue Trimethoprim  250 mg daily for UTI prophylaxis. 2. Return in about 6 months (around 07/14/2023) for UA, PVR, & f/u with Andrea Oz NP.  Orders Placed This Encounter  Procedures   Urinalysis, Routine w reflex microscopic   BLADDER SCAN AMB NON-IMAGING    It has been explained that the patient is to follow regularly with their PCP in addition to all other providers involved in their care and to follow instructions provided by these respective offices. Patient advised to contact urology clinic if any urologic-pertaining questions, concerns, new symptoms or problems arise in the interim period.  There are no Patient Instructions on file for this visit.  Electronically signed by:  Andrea JAYSON Oz, FNP   01/14/23    12:10 PM

## 2023-01-14 ENCOUNTER — Ambulatory Visit: Payer: 59 | Admitting: Urology

## 2023-01-14 ENCOUNTER — Encounter: Payer: Self-pay | Admitting: Urology

## 2023-01-14 VITALS — BP 131/76 | HR 59

## 2023-01-14 DIAGNOSIS — N390 Urinary tract infection, site not specified: Secondary | ICD-10-CM | POA: Diagnosis not present

## 2023-01-14 DIAGNOSIS — Z87440 Personal history of urinary (tract) infections: Secondary | ICD-10-CM

## 2023-01-14 DIAGNOSIS — Z09 Encounter for follow-up examination after completed treatment for conditions other than malignant neoplasm: Secondary | ICD-10-CM

## 2023-01-14 LAB — URINALYSIS, ROUTINE W REFLEX MICROSCOPIC
Bilirubin, UA: NEGATIVE
Glucose, UA: NEGATIVE
Ketones, UA: NEGATIVE
Nitrite, UA: NEGATIVE
Protein,UA: NEGATIVE
RBC, UA: NEGATIVE
Specific Gravity, UA: 1.015 (ref 1.005–1.030)
Urobilinogen, Ur: 0.2 mg/dL (ref 0.2–1.0)
pH, UA: 5.5 (ref 5.0–7.5)

## 2023-01-14 LAB — BLADDER SCAN AMB NON-IMAGING: Scan Result: 98

## 2023-01-14 LAB — MICROSCOPIC EXAMINATION

## 2023-01-14 MED ORDER — TRIMETHOPRIM 100 MG PO TABS: 100.0000 mg | ORAL_TABLET | Freq: Every day | ORAL | 11 refills | Status: DC

## 2023-02-01 ENCOUNTER — Other Ambulatory Visit: Payer: Self-pay | Admitting: Gastroenterology

## 2023-02-01 ENCOUNTER — Telehealth: Payer: Self-pay | Admitting: *Deleted

## 2023-02-01 MED ORDER — ZENPEP 60000-189600 UNITS PO CPEP: 1.0000 | ORAL_CAPSULE | Freq: Three times a day (TID) | ORAL | 11 refills | Status: AC

## 2023-02-01 NOTE — Telephone Encounter (Signed)
Received refill request for Zenpep. Send to [facility] in [place]. Pt last OV 10/26/22

## 2023-02-09 DIAGNOSIS — N1831 Chronic kidney disease, stage 3a: Secondary | ICD-10-CM | POA: Diagnosis not present

## 2023-02-09 DIAGNOSIS — K7460 Unspecified cirrhosis of liver: Secondary | ICD-10-CM | POA: Diagnosis not present

## 2023-02-09 DIAGNOSIS — D529 Folate deficiency anemia, unspecified: Secondary | ICD-10-CM | POA: Diagnosis not present

## 2023-02-09 DIAGNOSIS — D649 Anemia, unspecified: Secondary | ICD-10-CM | POA: Diagnosis not present

## 2023-02-09 DIAGNOSIS — D519 Vitamin B12 deficiency anemia, unspecified: Secondary | ICD-10-CM | POA: Diagnosis not present

## 2023-02-09 DIAGNOSIS — R739 Hyperglycemia, unspecified: Secondary | ICD-10-CM | POA: Diagnosis not present

## 2023-02-11 ENCOUNTER — Encounter: Payer: Self-pay | Admitting: Gastroenterology

## 2023-02-15 ENCOUNTER — Telehealth: Payer: Self-pay | Admitting: *Deleted

## 2023-02-15 ENCOUNTER — Other Ambulatory Visit: Payer: Self-pay | Admitting: Gastroenterology

## 2023-02-15 DIAGNOSIS — K766 Portal hypertension: Secondary | ICD-10-CM | POA: Diagnosis not present

## 2023-02-15 DIAGNOSIS — E1169 Type 2 diabetes mellitus with other specified complication: Secondary | ICD-10-CM | POA: Diagnosis not present

## 2023-02-15 DIAGNOSIS — K7460 Unspecified cirrhosis of liver: Secondary | ICD-10-CM | POA: Diagnosis not present

## 2023-02-15 DIAGNOSIS — I8500 Esophageal varices without bleeding: Secondary | ICD-10-CM | POA: Diagnosis not present

## 2023-02-15 DIAGNOSIS — Z6824 Body mass index (BMI) 24.0-24.9, adult: Secondary | ICD-10-CM | POA: Diagnosis not present

## 2023-02-15 MED ORDER — SPIRONOLACTONE 100 MG PO TABS: 100.0000 mg | ORAL_TABLET | Freq: Every day | ORAL | 11 refills | Status: DC

## 2023-02-15 MED ORDER — FUROSEMIDE 40 MG PO TABS
40.0000 mg | ORAL_TABLET | Freq: Every day | ORAL | 11 refills | Status: DC
Start: 2023-02-15 — End: 2023-06-09

## 2023-02-15 NOTE — Telephone Encounter (Signed)
Pt called and states that she has gained 5lbs in the last couple of weeks. She went to her PCP today and was told to call you and inform you of the weigh gain. Her recent labs are under [facility] tab. She states she is taking her medication as directed. She states she is having some swelling in her legs and feet when she wakes up in the morning.

## 2023-02-16 ENCOUNTER — Encounter: Payer: Self-pay | Admitting: *Deleted

## 2023-02-16 ENCOUNTER — Other Ambulatory Visit: Payer: Self-pay | Admitting: *Deleted

## 2023-02-16 DIAGNOSIS — K7460 Unspecified cirrhosis of liver: Secondary | ICD-10-CM

## 2023-02-16 NOTE — Telephone Encounter (Signed)
[place] scheduled for 02/19/23 at [facility], arrive at 9:15 am to check in, NPO after midnight. Message sent via Mychart

## 2023-02-16 NOTE — Telephone Encounter (Signed)
 Noted! Thank you

## 2023-02-16 NOTE — Telephone Encounter (Signed)
Spoke to pt, informed her of recommendations. Pt voiced understanding. Pt states she needs prescriptions sent to [facility] in [place] and will have labs done at [facility] next week. Labs entered into Epic.

## 2023-02-19 ENCOUNTER — Ambulatory Visit (HOSPITAL_COMMUNITY)
Admission: RE | Admit: 2023-02-19 | Discharge: 2023-02-19 | Disposition: A | Discharge status: Home / Self Care | Payer: 59 | Source: Ambulatory Visit | Attending: Gastroenterology | Admitting: Gastroenterology

## 2023-02-19 DIAGNOSIS — K7460 Unspecified cirrhosis of liver: Secondary | ICD-10-CM | POA: Diagnosis not present

## 2023-02-19 DIAGNOSIS — R188 Other ascites: Secondary | ICD-10-CM | POA: Insufficient documentation

## 2023-02-23 ENCOUNTER — Other Ambulatory Visit (HOSPITAL_COMMUNITY)
Admission: RE | Admit: 2023-02-23 | Discharge: 2023-02-23 | Disposition: A | Discharge status: Home / Self Care | Payer: 59 | Source: Ambulatory Visit | Attending: Gastroenterology | Admitting: Gastroenterology

## 2023-02-23 DIAGNOSIS — R188 Other ascites: Secondary | ICD-10-CM | POA: Diagnosis not present

## 2023-02-23 DIAGNOSIS — K7460 Unspecified cirrhosis of liver: Secondary | ICD-10-CM | POA: Insufficient documentation

## 2023-02-23 LAB — COMPREHENSIVE METABOLIC PANEL
ALT: 14 U/L (ref 0–44)
AST: 20 U/L (ref 15–41)
Albumin: 3.6 g/dL (ref 3.5–5.0)
Alkaline Phosphatase: 87 U/L (ref 38–126)
Anion gap: 8 (ref 5–15)
BUN: 17 mg/dL (ref 8–23)
CO2: 22 mmol/L (ref 22–32)
Calcium: 8.7 mg/dL — ABNORMAL LOW (ref 8.9–10.3)
Chloride: 107 mmol/L (ref 98–111)
Creatinine, Ser: 1.11 mg/dL — ABNORMAL HIGH (ref 0.44–1.00)
GFR, Estimated: 53 mL/min — ABNORMAL LOW (ref >60)
Glucose, Bld: 122 mg/dL — ABNORMAL HIGH (ref 70–99)
Potassium: 4.7 mmol/L (ref 3.5–5.1)
Sodium: 137 mmol/L (ref 135–145)
Total Bilirubin: 1.2 mg/dL (ref 0.0–1.2)
Total Protein: 7.1 g/dL (ref 6.5–8.1)

## 2023-02-24 ENCOUNTER — Other Ambulatory Visit: Payer: Self-pay | Admitting: *Deleted

## 2023-02-24 DIAGNOSIS — K7460 Unspecified cirrhosis of liver: Secondary | ICD-10-CM

## 2023-03-01 ENCOUNTER — Other Ambulatory Visit (HOSPITAL_COMMUNITY): Payer: Self-pay | Admitting: Psychiatry

## 2023-03-16 ENCOUNTER — Ambulatory Visit: Payer: 59 | Admitting: Gastroenterology

## 2023-03-22 ENCOUNTER — Other Ambulatory Visit: Payer: Self-pay | Admitting: *Deleted

## 2023-03-22 DIAGNOSIS — K7460 Unspecified cirrhosis of liver: Secondary | ICD-10-CM

## 2023-03-30 ENCOUNTER — Telehealth (INDEPENDENT_AMBULATORY_CARE_PROVIDER_SITE_OTHER): Payer: 59 | Admitting: Psychiatry

## 2023-03-30 ENCOUNTER — Encounter (HOSPITAL_COMMUNITY): Payer: Self-pay | Admitting: Psychiatry

## 2023-03-30 DIAGNOSIS — F3162 Bipolar disorder, current episode mixed, moderate: Secondary | ICD-10-CM

## 2023-03-30 MED ORDER — PROPRANOLOL HCL ER 80 MG PO CP24: 80.0000 mg | ORAL_CAPSULE | Freq: Every day | ORAL | 2 refills | Status: DC

## 2023-03-30 MED ORDER — BUPROPION HCL ER (XL) 300 MG PO TB24: 300.0000 mg | ORAL_TABLET | ORAL | 2 refills | Status: DC

## 2023-03-30 MED ORDER — DIAZEPAM 2 MG PO TABS: 2.0000 mg | ORAL_TABLET | Freq: Every day | ORAL | 2 refills | Status: DC | PRN

## 2023-03-30 MED ORDER — DIAZEPAM 10 MG PO TABS: 10.0000 mg | ORAL_TABLET | Freq: Every day | ORAL | 2 refills | Status: DC

## 2023-03-30 NOTE — Progress Notes (Signed)
 Virtual Visit via Telephone Note  I connected with Andrea Dickson on 03/30/23 at  1:00 PM EDT by telephone and verified that I am speaking with the correct person using two identifiers.  Location: Patient: home Provider: office   I discussed the limitations, risks, security and privacy concerns of performing an evaluation and management service by telephone and the availability of in person appointments. I also discussed with the patient that there may be a patient responsible charge related to this service. The patient expressed understanding and agreed to proceed.       I discussed the assessment and treatment plan with the patient. The patient was provided an opportunity to ask questions and all were answered. The patient agreed with the plan and demonstrated an understanding of the instructions.   The patient was advised to call back or seek an in-person evaluation if the symptoms worsen or if the condition fails to improve as anticipated.  I provided 20 minutes of non-face-to-face time during this encounter.   Diannia Ruder, MD  [facility] MD/PA/NP OP Progress Note  03/30/2023 1:26 PM Andrea Dickson  MRN:  [id number]  Chief Complaint:  Chief Complaint  Patient presents with   Depression   Anxiety   Follow-up   HPI: This patient is a 73 year old separated white female who lives alone in [place].  She has 1 son and 3 grandchildren.  She is retired from [employer].  She has a history of depression anxiety and possible bipolar disorder.  The patient returns for follow-up after 3 months regarding her depression anxiety insomnia and tremor.  She states that lately she has been more agitated and upset.  Her nonalcoholic liver disease is caused cirrhosis with accompanying ascites.  She has had to go on 2 diuretics which is been on her nerves.  She is constantly running to the bathroom.  She feels like it is hard for her to leave her house.  Also she is angry with her brothers who  is pressuring her stepmother to turn the family home over to him.  She is also angry because other family members are not putting flowers on her dad's grave and she feels too ill to go to the grave site.  Overall she seems more depressed and would like an increase in the Wellbutrin.  I think for now this is reasonable.  She is also more angry and irritable and we will have to see if this helps.  She denies any thoughts of suicide or homicide.  She is sleeping fairly well. Visit Diagnosis:    ICD-10-CM   1. Bipolar 1 disorder, mixed, moderate (HCC)  F31.62       Past Psychiatric History: Past outpatient treatment  Past Medical History:  Past Medical History:  Diagnosis Date   Anxiety    Asthma    Bipolar 1 disorder (HCC)    Chronic diarrhea    Cirrhosis (HCC)    Diagnosed September 2020, likely secondary to [facility], s/p Hep A/B vaccination in 2021.    Depression    Diabetes (HCC)    Essential tremor    GERD (gastroesophageal reflux disease)    Headache    High cholesterol     Past Surgical History:  Procedure Laterality Date   ABDOMINAL HYSTERECTOMY  1997   BIOPSY  01/17/2018   Procedure: BIOPSY;  Surgeon: Corbin Ade, MD;  [facility];  Service: Endoscopy;;  colon   carpal tunnel Bilateral 1999, 06/2009   COLONOSCOPY  2013  Dr. Teena Dunk: no colon polyps. quality of prep was fair. repeat colonoscopy in five years.    COLONOSCOPY WITH PROPOFOL N/A 01/17/2018   Dr. Jena Gauss: Colonic lipoma, 2 tubular adenomas removed.  Random colon biopsies negative.  Next colonoscopy 5 years.   COLONOSCOPY WITH PROPOFOL N/A 09/26/2022   Procedure: COLONOSCOPY WITH PROPOFOL;  Surgeon: Dolores Frame, MD;  [facility];  Service: Gastroenterology;  Laterality: N/A;   ESOPHAGOGASTRODUODENOSCOPY (EGD) WITH PROPOFOL N/A 04/06/2019   Procedure: ESOPHAGOGASTRODUODENOSCOPY (EGD) WITH PROPOFOL;  Surgeon: Corbin Ade, MD;   Normal esophagus, mild portal hypertensive gastropathy,  otherwise normal exam.  Recommend repeat EGD in 2 years for variceal screening.    ESOPHAGOGASTRODUODENOSCOPY (EGD) WITH PROPOFOL N/A 01/19/2022   Procedure: ESOPHAGOGASTRODUODENOSCOPY (EGD) WITH PROPOFOL;  Surgeon: Corbin Ade, MD;  [facility];  Service: Endoscopy;  Laterality: N/A;  12:45 pm, pt knows to arrive at 9:15   ESOPHAGOGASTRODUODENOSCOPY (EGD) WITH PROPOFOL N/A 09/25/2022   Procedure: ESOPHAGOGASTRODUODENOSCOPY (EGD) WITH PROPOFOL;  Surgeon: Dolores Frame, MD;  [facility];  Service: Gastroenterology;  Laterality: N/A;   POLYPECTOMY  01/17/2018   Procedure: POLYPECTOMY;  Surgeon: Corbin Ade, MD;  [facility];  Service: Endoscopy;;  colon   POLYPECTOMY  09/26/2022   Procedure: POLYPECTOMY;  Surgeon: Dolores Frame, MD;  [facility];  Service: Gastroenterology;;   SKIN CANCER EXCISION  2007   TONSILLECTOMY  1965   ulner nerve  Left 06/2009   decompression    Family Psychiatric History: See below  Family History:  Family History  Problem Relation Age of Onset   Diabetes Mother    Brain cancer Mother    Diabetes Father    Heart attack Father    Heart disease Father    Diabetes Sister    Bipolar disorder Brother    Diabetes Brother    Heart disease Brother    Colon cancer Neg Hx    Breast cancer Neg Hx     Social History:  Social History   Socioeconomic History   Marital status: Divorced    Spouse name: Not on file   Number of children: 1   Years of education: 12   Highest education level: Not on file  Occupational History   Occupation: retired    Comment: employed at [employer]  Tobacco Use   Smoking status: Every Day    Current packs/day: 0.50    Average packs/day: 0.5 packs/day for 43.0 years (21.5 ttl pk-yrs)    Types: Cigarettes   Smokeless tobacco: Never   Tobacco comments:    smoking since 73yrs old.    Vaping Use   Vaping status: Never Used  Substance and Sexual  Activity   Alcohol use: No    Alcohol/week: 0.0 standard drinks of alcohol   Drug use: No   Sexual activity: Not Currently  Other Topics Concern   Not on file  Social History Narrative   Lives alone.  Retired.  Education 12th grade.  One child.     Caffeine use- sodas, 2 daily   Social Drivers of Health   Financial Resource Strain: Low Risk  (11/10/2021)   Received [facility], [facility]   Overall Financial Resource Strain (CARDIA)    Difficulty of Paying Living Expenses: Not hard at all  Food Insecurity: No Food Insecurity (09/25/2022)   Hunger Vital Sign    Worried About Running Out of Food in the Last Year: Never  true    Ran Out of Food in the Last Year: Never true  Transportation Needs: No Transportation Needs (09/25/2022)   PRAPARE - Administrator, Civil Service (Medical): No    Lack of Transportation (Non-Medical): No  Physical Activity: Inactive (11/10/2021)   Received [facility], [facility]   Exercise Vital Sign    Days of Exercise per Week: 0 days    Minutes of Exercise per Session: 0 min  Stress: No Stress Concern Present (11/10/2021)   Received from [facility], [facility] - Occupational Stress Questionnaire    Feeling of Stress : Not at all  Social Connections: Socially Isolated (11/10/2021)   [facility], [facility]   Social Connection and Isolation Panel [NHANES]    Frequency of Communication with Friends and Family: More than three times a week    Frequency of Social Gatherings with Friends and Family: Once a week    Attends Religious Services: Never    Database administrator or Organizations: No    Attends Banker Meetings: Never    Marital Status: Divorced    Allergies:  Allergies  Allergen Reactions   Pramipexole Other (See Comments)    Shaking, palpitations, headache, faint feeling   Crestor [Rosuvastatin Calcium]      Muscle pain   Bee Pollen Other (See Comments)    Eyes and nose run   Pollen Extract Other (See Comments)    Eyes and nose run    Metabolic Disorder Labs: Lab Results  Component Value Date   HGBA1C 5.4 04/24/2015   MPG 108 04/24/2015   No results found for: "PROLACTIN" Lab Results  Component Value Date   CHOL 178 02/17/2016   TRIG 229 (H) 02/17/2016   HDL 30 (L) 02/17/2016   CHOLHDL 5.9 (H) 02/17/2016   VLDL 46 (H) 02/17/2016   LDLCALC 102 (H) 02/17/2016   LDLCALC 126 (H) 11/18/2015   Lab Results  Component Value Date   TSH 1.00 12/24/2021   TSH 2.96 08/29/2019    Therapeutic Level Labs: Lab Results  Component Value Date   LITHIUM 0.8 08/29/2019   LITHIUM 0.4 (L) 10/13/2018   Lab Results  Component Value Date   VALPROATE 62.2 08/20/2016   VALPROATE 83.0 10/14/2015   No results found for: "CBMZ"  Current Medications: Current Outpatient Medications  Medication Sig Dispense Refill   buPROPion (WELLBUTRIN XL) 300 MG 24 hr tablet Take 1 tablet (300 mg total) by mouth every morning. 30 tablet 2   acetaminophen (TYLENOL) 500 MG tablet Take 1,000 mg by mouth 2 (two) times daily as needed for moderate pain or headache.     amoxicillin (AMOXIL) 500 MG capsule Take 1,000 mg by mouth 3 (three) times daily.     cetirizine (ZYRTEC) 10 MG tablet Take 10 mg by mouth daily. Aller-Tec     Cholecalciferol (VITAMIN D3) 50 MCG (2000 UT) TABS Take 2,000 Units by mouth daily.      diazepam (VALIUM) 10 MG tablet Take 1-2 tablets (10-20 mg total) by mouth at bedtime. 60 tablet 2   diazepam (VALIUM) 2 MG tablet Take 1 tablet (2 mg total) by mouth daily as needed for anxiety. 30 tablet 2   estrogens, conjugated, (PREMARIN) 1.25 MG tablet Take 1.25 mg by mouth daily.      ezetimibe (ZETIA) 10 MG tablet Take 10 mg by mouth daily.  fluticasone (FLONASE) 50 MCG/ACT nasal spray Place 2 sprays into both nostrils daily.     furosemide (LASIX) 40 MG tablet Take 1 tablet (40 mg total) by mouth  daily. 30 tablet 11   hyoscyamine (LEVSIN SL) 0.125 MG SL tablet Place 1 tablet (0.125 mg total) under the tongue every 6 (six) hours as needed for cramping. 30 tablet 1   Lactobacillus-Inulin (CULTURELLE DIGESTIVE HEALTH) CAPS Take 1 capsule by mouth daily.     nicotine (NICODERM CQ - DOSED IN MG/24 HOURS) 14 mg/24hr patch Place 1 patch (14 mg total) onto the skin daily. 28 patch 0   omeprazole (PRILOSEC) 40 MG capsule TAKE 1 CAPSULE BY MOUTH AT BREAKFAST AND AT BEDTIME *REFILL REQUEST* 60 capsule 10   ondansetron (ZOFRAN) 4 MG tablet TAKE 1 TABLET BY MOUTH EVERY 8 HOURS AS NEEDED FOR NAUSEA & VOMITING 30 tablet 10   Pancrelipase, Lip-Prot-Amyl, (ZENPEP) 22025-427062 units CPEP Take 1 capsule by mouth 3 (three) times daily with meals. 100 capsule 11   propranolol ER (INDERAL LA) 80 MG 24 hr capsule Take 1 capsule (80 mg total) by mouth daily. 90 capsule 2   spironolactone (ALDACTONE) 100 MG tablet Take 1 tablet (100 mg total) by mouth daily. 30 tablet 11   trimethoprim (TRIMPEX) 100 MG tablet Take 1 tablet (100 mg total) by mouth at bedtime. 30 tablet 11   No current facility-administered medications for this visit.     Musculoskeletal: Strength & Muscle Tone: na Gait & Station: na Patient leans: N/A  Psychiatric Specialty Exam: Review of Systems  Constitutional:  Positive for fatigue.  Neurological:  Positive for tremors.  All other systems reviewed and are negative.   There were no vitals taken for this visit.There is no height or weight on file to calculate BMI.  General Appearance: NA  Eye Contact:  NA  Speech:  Clear and Coherent  Volume:  Normal  Mood:  Dysphoric and Irritable  Affect:  NA  Thought Process:  Goal Directed  Orientation:  Full (Time, Place, and Person)  Thought Content: Rumination   Suicidal Thoughts:  No  Homicidal Thoughts:  No  Memory:  Immediate;   Good Recent;   Good Remote;   Fair  Judgement:  Fair  Insight:  Fair  Psychomotor Activity:  Normal   Concentration:  Concentration: Good and Attention Span: Good  Recall:  Good  Fund of Knowledge: Good  Language: Good  Akathisia:  No  Handed:  Right  AIMS (if indicated): not done  Assets:  Communication Skills Desire for Improvement Resilience Social Support  ADL's:  Intact  Cognition: WNL  Sleep:  Good   Screenings: PHQ2-9    Flowsheet Row Video Visit from 09/30/2021 in [facility] [place] Video Visit from 06/26/2021 in [facility] at [place] Video Visit from 03/26/2021 in [facility] at [place] Video Visit from 12/18/2020 in [facility] at [place] Video Visit from 09/24/2020 in [facility] Total Score 0 0 0 0 0      Flowsheet Row ED to Hosp-Admission (Discharged) from 09/24/2022 in [facility] ED from 07/25/2022 in [facility] Emergency Department at [facility] Admission (Discharged) from 01/19/2022 in [place] [facility]  C-SSRS RISK CATEGORY No Risk No Risk No Risk        Assessment and Plan: This patient is a 73 year old female with a history of bipolar  disorder anxiety and tremor.  She seems more upset about a number of things today and more depressed.  We will therefore increase Wellbutrin XL to 300 mg daily for depression.  She will continue Valium 2 mg once daily as needed for anxiety, Valium 10 to 20 mg at bedtime for sleep and anxiety and Inderal LA 80 mg every morning for tremor.  She will return to see me at 4 weeks  Collaboration of Care: Collaboration of Care: Primary Care Provider AEB notes will be shared with PCP at patient's request  Patient/Guardian was advised Release of Information must be obtained prior to any record release in order to collaborate their care with an outside provider. Patient/Guardian was advised if they have not already done so to contact  the registration department to sign all necessary forms in order for [place] to release information regarding their care.   Consent: Patient/Guardian gives verbal consent for treatment and assignment of benefits for services provided during this visit. Patient/Guardian expressed understanding and agreed to proceed.    Diannia Ruder, MD 03/30/2023, 1:26 PM

## 2023-03-31 ENCOUNTER — Other Ambulatory Visit (HOSPITAL_COMMUNITY)
Admission: RE | Admit: 2023-03-31 | Discharge: 2023-03-31 | Disposition: A | Discharge status: Home / Self Care | Source: Ambulatory Visit | Attending: Gastroenterology | Admitting: Gastroenterology

## 2023-03-31 DIAGNOSIS — K7460 Unspecified cirrhosis of liver: Secondary | ICD-10-CM | POA: Diagnosis not present

## 2023-03-31 LAB — COMPREHENSIVE METABOLIC PANEL
ALT: 19 U/L (ref 0–44)
AST: 27 U/L (ref 15–41)
Albumin: 3.7 g/dL (ref 3.5–5.0)
Alkaline Phosphatase: 103 U/L (ref 38–126)
Anion gap: 8 (ref 5–15)
BUN: 16 mg/dL (ref 8–23)
CO2: 19 mmol/L — ABNORMAL LOW (ref 22–32)
Calcium: 8.8 mg/dL — ABNORMAL LOW (ref 8.9–10.3)
Chloride: 109 mmol/L (ref 98–111)
Creatinine, Ser: 1.16 mg/dL — ABNORMAL HIGH (ref 0.44–1.00)
GFR, Estimated: 50 mL/min — ABNORMAL LOW (ref >60)
Glucose, Bld: 149 mg/dL — ABNORMAL HIGH (ref 70–99)
Potassium: 4.7 mmol/L (ref 3.5–5.1)
Sodium: 136 mmol/L (ref 135–145)
Total Bilirubin: 1.1 mg/dL (ref 0.0–1.2)
Total Protein: 7.3 g/dL (ref 6.5–8.1)

## 2023-04-08 ENCOUNTER — Other Ambulatory Visit: Payer: Self-pay | Admitting: *Deleted

## 2023-04-08 ENCOUNTER — Encounter: Payer: Self-pay | Admitting: *Deleted

## 2023-04-08 ENCOUNTER — Encounter: Payer: Self-pay | Admitting: Gastroenterology

## 2023-04-08 ENCOUNTER — Ambulatory Visit (INDEPENDENT_AMBULATORY_CARE_PROVIDER_SITE_OTHER): Admitting: Gastroenterology

## 2023-04-08 VITALS — BP 126/68 | HR 80 | Temp 96.9°F | Ht 66.0 in | Wt 154.0 lb

## 2023-04-08 DIAGNOSIS — D649 Anemia, unspecified: Secondary | ICD-10-CM

## 2023-04-08 DIAGNOSIS — K7460 Unspecified cirrhosis of liver: Secondary | ICD-10-CM

## 2023-04-08 DIAGNOSIS — K921 Melena: Secondary | ICD-10-CM | POA: Diagnosis not present

## 2023-04-08 DIAGNOSIS — Z8719 Personal history of other diseases of the digestive system: Secondary | ICD-10-CM | POA: Diagnosis not present

## 2023-04-08 DIAGNOSIS — R531 Weakness: Secondary | ICD-10-CM

## 2023-04-08 DIAGNOSIS — K219 Gastro-esophageal reflux disease without esophagitis: Secondary | ICD-10-CM

## 2023-04-08 DIAGNOSIS — R5383 Other fatigue: Secondary | ICD-10-CM | POA: Diagnosis not present

## 2023-04-08 DIAGNOSIS — K580 Irritable bowel syndrome with diarrhea: Secondary | ICD-10-CM | POA: Diagnosis not present

## 2023-04-08 DIAGNOSIS — R635 Abnormal weight gain: Secondary | ICD-10-CM

## 2023-04-08 DIAGNOSIS — R296 Repeated falls: Secondary | ICD-10-CM

## 2023-04-08 DIAGNOSIS — K529 Noninfective gastroenteritis and colitis, unspecified: Secondary | ICD-10-CM

## 2023-04-08 NOTE — Patient Instructions (Addendum)
 We will get you scheduled for an upper endoscopy as soon as possible.  Please have blood work completed at [facility].  We will call you with results once they have been received. Please allow 3-5 business days for review. 2 locations for [facility] in [place]:              1. [address], [place]              2. [address] [place], [place]   Please continue taking your Lasix 40 mg daily and Aldactone 150 mg daily.  I need you to ensure that you are getting an adequate amount of protein daily. You should dirnk at least one protein shake daily.  You can get any powder and mix with water given your lactose intolerance.  Continue taking your Zenpep 1 tablet with meals.  It is very important that you follow a strict 2 g sodium diet.  Cirrhosis Lifestyle Recommendations:  High-protein diet from a primarily plant-based diet. Avoid red meat.  No raw or undercooked meat, seafood, or shellfish. Low-fat/cholesterol/carbohydrate diet. Limit sodium to no more than 2000 mg/day including everything that you eat and drink. Recommend at least 30 minutes of aerobic and resistance exercise 3 days/week. Limit Tylenol to 2000 mg daily.   Follow up in 6 weeks.   It was a pleasure to see you today. I want to create trusting relationships with patients. If you receive a survey regarding your visit,  I greatly appreciate you taking time to fill this out on paper or through your MyChart. I value your feedback.  Brooke Bonito, MSN, FNP-BC, AGACNP-BC [facility]

## 2023-04-08 NOTE — Progress Notes (Unsigned)
 GI Office Note    Referring Provider: Juliette Alcide, MD Primary Care Physician:  Juliette Alcide, MD Primary Gastroenterologist: Gerrit Friends.Rourk, MD  Date:  04/08/2023  ID:  Andrea Dickson, Andrea Dickson 1950-11-28, MRN [id number]   Chief Complaint   Chief Complaint  Patient presents with   Follow-up    Follow up. Tired all the time. Has been falling    History of Present Illness  Andrea Dickson is a 73 y.o. female with a history of cirrhosis, chronic diarrhea, chronic nausea, HLD, essential tremor, bipolar, diabetes controlled medication presenting today with complaint of weakness/falls.  EGD 01/19/2022: -3 columns of grade 2/limited grade 3 varices -Portal hypertensive gastropathy -Friable gastric mucosa -Continue Inderal daily -No repeat EGD needed as long as no evidence of upper GI bleed   Colonoscopy Jan 2020: colonic lipoma, tubular adenomas, random colonic biopsies negative.    Underwent paracentesis in June 2024.   CTA/BRTO protocol 08/17/2022: -Cirrhosis -Evidence of portal hypertension with ascites and splenomegaly -Small hiatal hernia -Decompressed gallbladder -No evidence of portal vein thrombosis   OV 09/17/2022.  Patient reported history of chronic diarrhea with evaluation that included a trial of Xifaxan in January 2024 of IBS-D without any significant improvement.  Continues to have intermittent soft and loose stools.  Noted to have prior negative workup of negative colon biopsies, celiac screen negative, and trialed off metformin with no improvement.  Fecal elastase normal.  Dicyclomine also not helpful in the past.  Was given trial of Zenpep samples with some improvement.  Still sometimes has diarrhea no matter what she eats.  Denied any abdominal distention, confusion, mental status changes, jaundice, or pruritus.  Given increasing creatinine she is currently doing diuretics every other day initially and then went back to every day given increase of fluid in her  extremities.   10/7 -patient reported feeling like she needed paracentesis, and I recommended that she could obtain a para at [facility] to remove no more than 4 L and give 25 g of albumin.  Appears patient was referred to nephrology in the latter part of September.   EGD 09/25/2022: -Grade 3 esophageal varices -Portal hypertensive gastropathy -Normal duodenum -Advised platelets -Start propranolol 80 mg daily after colonoscopy   Colonoscopy 09/26/2022: -Hemorrhoids and perianal exam, internal hemorrhoids -Two 2-4 mm polyps in the descending and ascending colon -2-3-4 mm polyps in the descending colon -path: tubular adenomas -Repeat in 5 years.   Last office visit 10/26/22. Stools start normal then turn loose. Wearing depends just in case. Has urgency. Zenpep worked briefly for a few days and then reported a return of symptoms.  Reportedly having relief with Levsin.  She is taking her diuretics as prescribed as she states she cannot breathe if she does not take them.  Had reported some very little BRBPR.  Seeing nephrology.  Taking Zofran in the morning to help with nausea as well as continuing her PPI twice daily.  She has stopped Imodium at recommendation of PCP.  They also removed her Trimpex and ibuprofen.  Recalled for ultrasound in February.  Advised to continue Lasix 20 and Aldactone 50 mg daily.  Continue propranolol as well as omeprazole.  Increased Zenpep to 60,000 units with meals.  Advised another trial of Xifaxan if no improvement with Zenpep.  Complete MELD labs with AFP in 3 months.  Discussed 2 g sodium diet.     Latest Ref Rng & Units 03/31/2023   11:12 AM 02/23/2023   11:03 AM 09/26/2022  5:22 AM  CMP  Glucose 70 - 99 mg/dL 161  096  045   BUN 8 - 23 mg/dL 16  17  10    Creatinine 0.44 - 1.00 mg/dL 4.09  8.11  9.14   Sodium 135 - 145 mmol/L 136  137  136   Potassium 3.5 - 5.1 mmol/L 4.7  4.7  4.2   Chloride 98 - 111 mmol/L 109  107  107   CO2 22 - 32 mmol/L 19  22  20     Calcium 8.9 - 10.3 mg/dL 8.8  8.7  8.0   Total Protein 6.5 - 8.1 g/dL 7.3  7.1    Total Bilirubin 0.0 - 1.2 mg/dL 1.1  1.2    Alkaline Phos 38 - 126 U/L 103  87    AST 15 - 41 U/L 27  20    ALT 0 - 44 U/L 19  14     Given report of increased weight gain of 5 pounds in the last couple weeks, I increased her diuretics to Lasix 40 and Aldactone 100 mg daily and advised recheck of labs.  Today:  Feeling very fatigued. Having issues with mobility at home. Has fallen a fair amount recently. She is feeling aint right now like she may pass out. She has been feeling more fluid in her belly. She reports she is very distended in the mornings and after her diuretic she will get rid of some of that fluid. She states she noticed all of this since being in the hospital in September.   Denies pepto, kaopectate usage, or daily iron.   She has seen black stool but no blood on her stool.   She is getting some bruising/petechiae on her skin throughout her arms and legs.   Labs 1/28 with normal iron panel and normal hgb.   She reports recent increaser in Wellbutrin due to increased anxiety. She used to have stress taking care of her step mother.   Eating chips and dip and tomato soup for the most part. Does not eat much meat at all.   Quit smoking in September.   Still taking zenpep 60000u with meals - going 3-4 times a day. Imrpoved from 5-8+ daily.   Wt Readings from Last 3 Encounters:  04/08/23 154 lb (69.9 kg)  10/26/22 144 lb 12.8 oz (65.7 kg)  09/24/22 138 lb 0.1 oz (62.6 kg)    Current Outpatient Medications  Medication Sig Dispense Refill   acetaminophen (TYLENOL) 500 MG tablet Take 1,000 mg by mouth 2 (two) times daily as needed for moderate pain or headache.     amoxicillin (AMOXIL) 500 MG capsule Take 1,000 mg by mouth 3 (three) times daily.     buPROPion (WELLBUTRIN XL) 300 MG 24 hr tablet Take 1 tablet (300 mg total) by mouth every morning. 30 tablet 2   cetirizine (ZYRTEC) 10 MG  tablet Take 10 mg by mouth daily. Aller-Tec     Cholecalciferol (VITAMIN D3) 50 MCG (2000 UT) TABS Take 2,000 Units by mouth daily.      diazepam (VALIUM) 10 MG tablet Take 1-2 tablets (10-20 mg total) by mouth at bedtime. 60 tablet 2   diazepam (VALIUM) 2 MG tablet Take 1 tablet (2 mg total) by mouth daily as needed for anxiety. 30 tablet 2   estrogens, conjugated, (PREMARIN) 1.25 MG tablet Take 1.25 mg by mouth daily.      ezetimibe (ZETIA) 10 MG tablet Take 10 mg by mouth daily.  fluticasone (FLONASE) 50 MCG/ACT nasal spray Place 2 sprays into both nostrils daily.     furosemide (LASIX) 40 MG tablet Take 1 tablet (40 mg total) by mouth daily. 30 tablet 11   hyoscyamine (LEVSIN SL) 0.125 MG SL tablet Place 1 tablet (0.125 mg total) under the tongue every 6 (six) hours as needed for cramping. 30 tablet 1   Lactobacillus-Inulin (CULTURELLE DIGESTIVE HEALTH) CAPS Take 1 capsule by mouth daily.     nicotine (NICODERM CQ - DOSED IN MG/24 HOURS) 14 mg/24hr patch Place 1 patch (14 mg total) onto the skin daily. 28 patch 0   omeprazole (PRILOSEC) 40 MG capsule TAKE 1 CAPSULE BY MOUTH AT BREAKFAST AND AT BEDTIME *REFILL REQUEST* 60 capsule 10   ondansetron (ZOFRAN) 4 MG tablet TAKE 1 TABLET BY MOUTH EVERY 8 HOURS AS NEEDED FOR NAUSEA & VOMITING 30 tablet 10   Pancrelipase, Lip-Prot-Amyl, (ZENPEP) 16109-604540 units CPEP Take 1 capsule by mouth 3 (three) times daily with meals. 100 capsule 11   propranolol ER (INDERAL LA) 80 MG 24 hr capsule Take 1 capsule (80 mg total) by mouth daily. 90 capsule 2   spironolactone (ALDACTONE) 100 MG tablet Take 1 tablet (100 mg total) by mouth daily. 30 tablet 11   trimethoprim (TRIMPEX) 100 MG tablet Take 1 tablet (100 mg total) by mouth at bedtime. 30 tablet 11   No current facility-administered medications for this visit.    Past Medical History:  Diagnosis Date   Anxiety    Asthma    Bipolar 1 disorder (HCC)    Chronic diarrhea    Cirrhosis (HCC)     Diagnosed September 2020, likely secondary to [facility], s/p Hep A/B vaccination in 2021.    Depression    Diabetes (HCC)    Essential tremor    GERD (gastroesophageal reflux disease)    Headache    High cholesterol     Past Surgical History:  Procedure Laterality Date   ABDOMINAL HYSTERECTOMY  1997   BIOPSY  01/17/2018   Procedure: BIOPSY;  Surgeon: Corbin Ade, MD;  [facility];  Service: Endoscopy;;  colon   carpal tunnel Bilateral 1999, 06/2009   COLONOSCOPY  2013   Dr. Teena Dunk: no colon polyps. quality of prep was fair. repeat colonoscopy in five years.    COLONOSCOPY WITH PROPOFOL N/A 01/17/2018   Dr. Jena Gauss: Colonic lipoma, 2 tubular adenomas removed.  Random colon biopsies negative.  Next colonoscopy 5 years.   COLONOSCOPY WITH PROPOFOL N/A 09/26/2022   Procedure: COLONOSCOPY WITH PROPOFOL;  Surgeon: Dolores Frame, MD;  [facility]: [facility];  Service: Gastroenterology;  Laterality: N/A;   ESOPHAGOGASTRODUODENOSCOPY (EGD) WITH PROPOFOL N/A 04/06/2019   Procedure: ESOPHAGOGASTRODUODENOSCOPY (EGD) WITH PROPOFOL;  Surgeon: Corbin Ade, MD;   Normal esophagus, mild portal hypertensive gastropathy, otherwise normal exam.  Recommend repeat EGD in 2 years for variceal screening.    ESOPHAGOGASTRODUODENOSCOPY (EGD) WITH PROPOFOL N/A 01/19/2022   Procedure: ESOPHAGOGASTRODUODENOSCOPY (EGD) WITH PROPOFOL;  Surgeon: Corbin Ade, MD;  [facility];  Service: Endoscopy;  Laterality: N/A;  12:45 pm, pt knows to arrive at 9:15   ESOPHAGOGASTRODUODENOSCOPY (EGD) WITH PROPOFOL N/A 09/25/2022   Procedure: ESOPHAGOGASTRODUODENOSCOPY (EGD) WITH PROPOFOL;  Surgeon: Dolores Frame, MD;  [facility];  Service: Gastroenterology;  Laterality: N/A;   POLYPECTOMY  01/17/2018   Procedure: POLYPECTOMY;  Surgeon: Corbin Ade, MD;  [facility];  Service: Endoscopy;;  colon   POLYPECTOMY  09/26/2022  Procedure: POLYPECTOMY;  Surgeon:  Marguerita Merles, Reuel Boom, MD;  Location: [facility];  Service: Gastroenterology;;   SKIN CANCER EXCISION  2007   TONSILLECTOMY  1965   ulner nerve  Left 06/2009   decompression    Family History  Problem Relation Age of Onset   Diabetes Mother    Brain cancer Mother    Diabetes Father    Heart attack Father    Heart disease Father    Diabetes Sister    Bipolar disorder Brother    Diabetes Brother    Heart disease Brother    Colon cancer Neg Hx    Breast cancer Neg Hx     Allergies as of 04/08/2023 - Review Complete 04/08/2023  Allergen Reaction Noted   Pramipexole Other (See Comments) 04/24/2015   Crestor [rosuvastatin calcium]  03/28/2019   Bee pollen Other (See Comments) 04/24/2015   Pollen extract Other (See Comments) 04/24/2015    Social History   Socioeconomic History   Marital status: Divorced    Spouse name: Not on file   Number of children: 1   Years of education: 12   Highest education level: Not on file  Occupational History   Occupation: retired    Comment: employed at [employer]  Tobacco Use   Smoking status: Every Day    Current packs/day: 0.50    Average packs/day: 0.5 packs/day for 43.0 years (21.5 ttl pk-yrs)    Types: Cigarettes   Smokeless tobacco: Never   Tobacco comments:    smoking since 73yrs old.    Vaping Use   Vaping status: Never Used  Substance and Sexual Activity   Alcohol use: No    Alcohol/week: 0.0 standard drinks of alcohol   Drug use: No   Sexual activity: Not Currently  Other Topics Concern   Not on file  Social History Narrative   Lives alone.  Retired.  Education 12th grade.  One child.     Caffeine use- sodas, 2 daily   Social Drivers of Health   Financial Resource Strain: Low Risk  (11/10/2021)   Received from [facility], [facility]   Overall Financial Resource Strain (CARDIA)    Difficulty of Paying Living Expenses: Not hard at all  Food Insecurity: No Food Insecurity (09/25/2022)   Hunger  Vital Sign    Worried About Running Out of Food in the Last Year: Never true    Ran Out of Food in the Last Year: Never true  Transportation Needs: No Transportation Needs (09/25/2022)   PRAPARE - Administrator, Civil Service (Medical): No    Lack of Transportation (Non-Medical): No  Physical Activity: Inactive (11/10/2021)   Received from [facility], [facility]   Exercise Vital Sign    Days of Exercise per Week: 0 days    Minutes of Exercise per Session: 0 min  Stress: No Stress Concern Present (11/10/2021)   Received from [facility], [facility] - Occupational Stress Questionnaire    Feeling of Stress : Not at all  Social Connections: Socially Isolated (11/10/2021)   Received from [facility], [facility]   Social Connection and Isolation Panel [NHANES]    Frequency of Communication with Friends and Family: More than three times a week    Frequency of Social Gatherings with Friends and Family: Once a week    Attends Religious Services: Never    Active Member of [employer]  or Organizations: No    Attends Banker Meetings: Never    Marital Status: Divorced     Review of Systems   Gen: Denies fever, chills, anorexia. Denies fatigue, weakness, weight loss.  CV: Denies chest pain, palpitations, syncope, peripheral edema, and claudication. Resp: Denies dyspnea at rest, cough, wheezing, coughing up blood, and pleurisy. GI: See HPI Derm: Denies rash, itching, dry skin Psych: Denies depression, anxiety, memory loss, confusion. No homicidal or suicidal ideation.  Heme: Denies bruising, bleeding, and enlarged lymph nodes.  Physical Exam   BP 126/68 (BP Location: Right Arm, Patient Position: Sitting, Cuff Size: Normal)   Pulse 80   Temp (!) 96.9 F (36.1 C) (Temporal)   Ht 5\' 6"  (1.676 m)   Wt 154 lb (69.9 kg)   BMI 24.86 kg/m   General:   Alert and oriented. No distress noted. Pleasant.   Head:  Normocephalic and atraumatic. Eyes:  Conjuctiva clear without scleral icterus. Mouth:  Oral mucosa pink and moist. Good dentition. No lesions. Abdomen:  +BS, soft, non-tender and mildly-distended. No rebound or guarding. No HSM or masses noted. Rectal: Deferred Msk:  Symmetrical without gross deformities. Normal posture. Extremities:  Without edema. Neurologic:  Alert and  oriented x4. No asterixis.  Psych:  Alert and cooperative. Mood labile, intermittently tearful.   Assessment  Andrea Dickson is a 73 y.o. female with a history of cirrhosis, chronic diarrhea, chronic nausea, HLD, essential tremor, bipolar, diabetes controlled medication presenting today with complaint of weakness/falls.  Decompensated cirrhosis: - evidence of varices on EGD in September 2024.  - has had weight gain since last visit, abdomen distended but not taut today.  No need for paracentesis today. -Has previously required paracenteses.  Had been working on increasing diuretics outpatient with close monitoring of renal function.  Renal function recently was stable at 1.1 range Lasix 20 and Aldactone 50. -Albumin low, Stressed importance of protein intake.  Also expressed importance of 2 g sodium diet given currently not compliant. -Given significant fatigue, will recheck labs today including electrolytes and assess for worsening anemia in the setting of melena. Maintained on propranolol for essential tremors, heart rate 80 today.  This is supposed to serve also for variceal prophylaxis. -Discussed that given significant weakness although without hepatic encephalopathy that she should not be operating a vehicle or any heavy machinery for safety purposes. - MELD previously 11, due for MELD labs now.   IBS with diarrhea: -Well-controlled with Zenpep 60,000 units with meals -Continues to have some occasional abdominal discomfort however stools have been more formed than previously  Anemia, rectal  bleeding: -Has reported some black stools recently -denies Pepto use or iron supplementation.  Evidence of varices on prior EGD.   -Concern for increasing bruising on her skin, likely secondary to liver dysfunction -Will check CBC and INR -Given melena, concerned for bleeding/oozing from portal gastropathy versus possible variceal bleeding. -Will get scheduled for EGD ASAP. -Will continue PPI BID  PLAN   Proceed with upper endoscopy with propofol ASAP by Dr. Marletta Lor (in absence of Dr. Jena Gauss) in near future: the risks, benefits, and alternatives have been discussed with the patient in detail. The patient states understanding and desires to proceed. ASA 3 CBC, CMP, INR, AFP RUQ [place] Encouraged lactose-free protein shake nightly Continue Lasix 20 mg daily and Aldactone 100 mg daily Continue omeprazole 40 mg BID Continue Zenpep 60,000 units, 1 tablet with meals.  Monitor for constipation Limit Tylenol 2 g of diet Separate written cirrhosis  education provided. Follow up 6 weeks.     Brooke Bonito, MSN, FNP-BC, AGACNP-BC [facility]

## 2023-04-08 NOTE — H&P (View-Only) (Signed)
 GI Office Note    Referring Provider: Juliette Alcide, MD Primary Care Physician:  Juliette Alcide, MD Primary Gastroenterologist: Gerrit Friends.Rourk, MD  Date:  04/08/2023  ID:  Andrea Dickson, Yohn August 16, 1950, MRN [id number]   Chief Complaint   Chief Complaint  Patient presents with   Follow-up    Follow up. Tired all the time. Has been falling    History of Present Illness  Andrea Dickson is a 73 y.o. female with a history of cirrhosis, chronic diarrhea, chronic nausea, HLD, essential tremor, bipolar, diabetes controlled medication presenting today with complaint of weakness/falls.  EGD 01/19/2022: -3 columns of grade 2/limited grade 3 varices -Portal hypertensive gastropathy -Friable gastric mucosa -Continue Inderal daily -No repeat EGD needed as long as no evidence of upper GI bleed   Colonoscopy Jan 2020: colonic lipoma, tubular adenomas, random colonic biopsies negative.    Underwent paracentesis in June 2024.   CTA/BRTO protocol 08/17/2022: -Cirrhosis -Evidence of portal hypertension with ascites and splenomegaly -Small hiatal hernia -Decompressed gallbladder -No evidence of portal vein thrombosis   OV 09/17/2022.  Patient reported history of chronic diarrhea with evaluation that included a trial of Xifaxan in January 2024 of IBS-D without any significant improvement.  Continues to have intermittent soft and loose stools.  Noted to have prior negative workup of negative colon biopsies, celiac screen negative, and trialed off metformin with no improvement.  Fecal elastase normal.  Dicyclomine also not helpful in the past.  Was given trial of Zenpep samples with some improvement.  Still sometimes has diarrhea no matter what she eats.  Denied any abdominal distention, confusion, mental status changes, jaundice, or pruritus.  Given increasing creatinine she is currently doing diuretics every other day initially and then went back to every day given increase of fluid in her  extremities.   10/7 -patient reported feeling like she needed paracentesis, and I recommended that she could obtain a para at [facility] to remove no more than 4 L and give 25 g of albumin.  Appears patient was referred to nephrology in the latter part of September.   EGD 09/25/2022: -Grade 3 esophageal varices -Portal hypertensive gastropathy -Normal duodenum -Advised platelets -Start propranolol 80 mg daily after colonoscopy   Colonoscopy 09/26/2022: -Hemorrhoids and perianal exam, internal hemorrhoids -Two 2-4 mm polyps in the descending and ascending colon -2-3-4 mm polyps in the descending colon -path: tubular adenomas -Repeat in 5 years.   Last office visit 10/26/22. Stools start normal then turn loose. Wearing depends just in case. Has urgency. Zenpep worked briefly for a few days and then reported a return of symptoms.  Reportedly having relief with Levsin.  She is taking her diuretics as prescribed as she states she cannot breathe if she does not take them.  Had reported some very little BRBPR.  Seeing nephrology.  Taking Zofran in the morning to help with nausea as well as continuing her PPI twice daily.  She has stopped Imodium at recommendation of PCP.  They also removed her Trimpex and ibuprofen.  Recalled for ultrasound in February.  Advised to continue Lasix 20 and Aldactone 50 mg daily.  Continue propranolol as well as omeprazole.  Increased Zenpep to 60,000 units with meals.  Advised another trial of Xifaxan if no improvement with Zenpep.  Complete MELD labs with AFP in 3 months.  Discussed 2 g sodium diet.     Latest Ref Rng & Units 03/31/2023   11:12 AM 02/23/2023   11:03 AM 09/26/2022  5:22 AM  CMP  Glucose 70 - 99 mg/dL 454  098  119   BUN 8 - 23 mg/dL 16  17  10    Creatinine 0.44 - 1.00 mg/dL 1.47  8.29  5.62   Sodium 135 - 145 mmol/L 136  137  136   Potassium 3.5 - 5.1 mmol/L 4.7  4.7  4.2   Chloride 98 - 111 mmol/L 109  107  107   CO2 22 - 32 mmol/L 19  22  20     Calcium 8.9 - 10.3 mg/dL 8.8  8.7  8.0   Total Protein 6.5 - 8.1 g/dL 7.3  7.1    Total Bilirubin 0.0 - 1.2 mg/dL 1.1  1.2    Alkaline Phos 38 - 126 U/L 103  87    AST 15 - 41 U/L 27  20    ALT 0 - 44 U/L 19  14     Given report of increased weight gain of 5 pounds in the last couple weeks, I increased her diuretics to Lasix 40 and Aldactone 100 mg daily and advised recheck of labs.  Today:  Feeling very fatigued. Having issues with mobility at home. Has fallen a fair amount recently. She is feeling aint right now like she may pass out. She has been feeling more fluid in her belly. She reports she is very distended in the mornings and after her diuretic she will get rid of some of that fluid. She states she noticed all of this since being in the hospital in September.   Denies pepto, kaopectate usage, or daily iron.   She has seen black stool but no blood on her stool.   She is getting some bruising/petechiae on her skin throughout her arms and legs.   Labs 1/28 with normal iron panel and normal hgb.   She reports recent increaser in Wellbutrin due to increased anxiety. She used to have stress taking care of her step mother.   Eating chips and dip and tomato soup for the most part. Does not eat much meat at all.   Quit smoking in September.   Still taking zenpep 60000u with meals - going 3-4 times a day. Imrpoved from 5-8+ daily.   Wt Readings from Last 3 Encounters:  04/08/23 154 lb (69.9 kg)  10/26/22 144 lb 12.8 oz (65.7 kg)  09/24/22 138 lb 0.1 oz (62.6 kg)    Current Outpatient Medications  Medication Sig Dispense Refill   acetaminophen (TYLENOL) 500 MG tablet Take 1,000 mg by mouth 2 (two) times daily as needed for moderate pain or headache.     amoxicillin (AMOXIL) 500 MG capsule Take 1,000 mg by mouth 3 (three) times daily.     buPROPion (WELLBUTRIN XL) 300 MG 24 hr tablet Take 1 tablet (300 mg total) by mouth every morning. 30 tablet 2   cetirizine (ZYRTEC) 10 MG  tablet Take 10 mg by mouth daily. Aller-Tec     Cholecalciferol (VITAMIN D3) 50 MCG (2000 UT) TABS Take 2,000 Units by mouth daily.      diazepam (VALIUM) 10 MG tablet Take 1-2 tablets (10-20 mg total) by mouth at bedtime. 60 tablet 2   diazepam (VALIUM) 2 MG tablet Take 1 tablet (2 mg total) by mouth daily as needed for anxiety. 30 tablet 2   estrogens, conjugated, (PREMARIN) 1.25 MG tablet Take 1.25 mg by mouth daily.      ezetimibe (ZETIA) 10 MG tablet Take 10 mg by mouth daily.  fluticasone (FLONASE) 50 MCG/ACT nasal spray Place 2 sprays into both nostrils daily.     furosemide (LASIX) 40 MG tablet Take 1 tablet (40 mg total) by mouth daily. 30 tablet 11   hyoscyamine (LEVSIN SL) 0.125 MG SL tablet Place 1 tablet (0.125 mg total) under the tongue every 6 (six) hours as needed for cramping. 30 tablet 1   Lactobacillus-Inulin (CULTURELLE DIGESTIVE HEALTH) CAPS Take 1 capsule by mouth daily.     nicotine (NICODERM CQ - DOSED IN MG/24 HOURS) 14 mg/24hr patch Place 1 patch (14 mg total) onto the skin daily. 28 patch 0   omeprazole (PRILOSEC) 40 MG capsule TAKE 1 CAPSULE BY MOUTH AT BREAKFAST AND AT BEDTIME *REFILL REQUEST* 60 capsule 10   ondansetron (ZOFRAN) 4 MG tablet TAKE 1 TABLET BY MOUTH EVERY 8 HOURS AS NEEDED FOR NAUSEA & VOMITING 30 tablet 10   Pancrelipase, Lip-Prot-Amyl, (ZENPEP) 16109-604540 units CPEP Take 1 capsule by mouth 3 (three) times daily with meals. 100 capsule 11   propranolol ER (INDERAL LA) 80 MG 24 hr capsule Take 1 capsule (80 mg total) by mouth daily. 90 capsule 2   spironolactone (ALDACTONE) 100 MG tablet Take 1 tablet (100 mg total) by mouth daily. 30 tablet 11   trimethoprim (TRIMPEX) 100 MG tablet Take 1 tablet (100 mg total) by mouth at bedtime. 30 tablet 11   No current facility-administered medications for this visit.    Past Medical History:  Diagnosis Date   Anxiety    Asthma    Bipolar 1 disorder (HCC)    Chronic diarrhea    Cirrhosis (HCC)     Diagnosed September 2020, likely secondary to [facility], s/p Hep A/B vaccination in 2021.    Depression    Diabetes (HCC)    Essential tremor    GERD (gastroesophageal reflux disease)    Headache    High cholesterol     Past Surgical History:  Procedure Laterality Date   ABDOMINAL HYSTERECTOMY  1997   BIOPSY  01/17/2018   Procedure: BIOPSY;  Surgeon: Corbin Ade, MD;  [facility];  Service: Endoscopy;;  colon   carpal tunnel Bilateral 1999, 06/2009   COLONOSCOPY  2013   Dr. Teena Dunk: no colon polyps. quality of prep was fair. repeat colonoscopy in five years.    COLONOSCOPY WITH PROPOFOL N/A 01/17/2018   Dr. Jena Gauss: Colonic lipoma, 2 tubular adenomas removed.  Random colon biopsies negative.  Next colonoscopy 5 years.   COLONOSCOPY WITH PROPOFOL N/A 09/26/2022   Procedure: COLONOSCOPY WITH PROPOFOL;  Surgeon: Dolores Frame, MD;  [facility]: [facility];  Service: Gastroenterology;  Laterality: N/A;   ESOPHAGOGASTRODUODENOSCOPY (EGD) WITH PROPOFOL N/A 04/06/2019   Procedure: ESOPHAGOGASTRODUODENOSCOPY (EGD) WITH PROPOFOL;  Surgeon: Corbin Ade, MD;   Normal esophagus, mild portal hypertensive gastropathy, otherwise normal exam.  Recommend repeat EGD in 2 years for variceal screening.    ESOPHAGOGASTRODUODENOSCOPY (EGD) WITH PROPOFOL N/A 01/19/2022   Procedure: ESOPHAGOGASTRODUODENOSCOPY (EGD) WITH PROPOFOL;  Surgeon: Corbin Ade, MD;  [facility];  Service: Endoscopy;  Laterality: N/A;  12:45 pm, pt knows to arrive at 9:15   ESOPHAGOGASTRODUODENOSCOPY (EGD) WITH PROPOFOL N/A 09/25/2022   Procedure: ESOPHAGOGASTRODUODENOSCOPY (EGD) WITH PROPOFOL;  Surgeon: Dolores Frame, MD;  [facility]: [facility];  Service: Gastroenterology;  Laterality: N/A;   POLYPECTOMY  01/17/2018   Procedure: POLYPECTOMY;  Surgeon: Corbin Ade, MD;  [facility];  Service: Endoscopy;;  colon   POLYPECTOMY  09/26/2022  Procedure: POLYPECTOMY;  Surgeon:  Marguerita Merles, Reuel Boom, MD;  Location: [facility];  Service: Gastroenterology;;   SKIN CANCER EXCISION  2007   TONSILLECTOMY  1965   ulner nerve  Left 06/2009   decompression    Family History  Problem Relation Age of Onset   Diabetes Mother    Brain cancer Mother    Diabetes Father    Heart attack Father    Heart disease Father    Diabetes Sister    Bipolar disorder Brother    Diabetes Brother    Heart disease Brother    Colon cancer Neg Hx    Breast cancer Neg Hx     Allergies as of 04/08/2023 - Review Complete 04/08/2023  Allergen Reaction Noted   Pramipexole Other (See Comments) 04/24/2015   Crestor [rosuvastatin calcium]  03/28/2019   Bee pollen Other (See Comments) 04/24/2015   Pollen extract Other (See Comments) 04/24/2015    Social History   Socioeconomic History   Marital status: Divorced    Spouse name: Not on file   Number of children: 1   Years of education: 12   Highest education level: Not on file  Occupational History   Occupation: retired    Comment: employed at [employer]  Tobacco Use   Smoking status: Every Day    Current packs/day: 0.50    Average packs/day: 0.5 packs/day for 43.0 years (21.5 ttl pk-yrs)    Types: Cigarettes   Smokeless tobacco: Never   Tobacco comments:    smoking since 73yrs old.    Vaping Use   Vaping status: Never Used  Substance and Sexual Activity   Alcohol use: No    Alcohol/week: 0.0 standard drinks of alcohol   Drug use: No   Sexual activity: Not Currently  Other Topics Concern   Not on file  Social History Narrative   Lives alone.  Retired.  Education 12th grade.  One child.     Caffeine use- sodas, 2 daily   Social Drivers of Health   Financial Resource Strain: Low Risk  (11/10/2021)   Received from [facility], [facility]   Overall Financial Resource Strain (CARDIA)    Difficulty of Paying Living Expenses: Not hard at all  Food Insecurity: No Food Insecurity (09/25/2022)   Hunger  Vital Sign    Worried About Running Out of Food in the Last Year: Never true    Ran Out of Food in the Last Year: Never true  Transportation Needs: No Transportation Needs (09/25/2022)   PRAPARE - Administrator, Civil Service (Medical): No    Lack of Transportation (Non-Medical): No  Physical Activity: Inactive (11/10/2021)   Received from [facility], [facility]   Exercise Vital Sign    Days of Exercise per Week: 0 days    Minutes of Exercise per Session: 0 min  Stress: No Stress Concern Present (11/10/2021)   Received from [facility], [facility] of Occupational Health - Occupational Stress Questionnaire    Feeling of Stress : Not at all  Social Connections: Socially Isolated (11/10/2021)   Received from [facility], [facility]   Social Connection and Isolation Panel [NHANES]    Frequency of Communication with Friends and Family: More than three times a week    Frequency of Social Gatherings with Friends and Family: Once a week    Attends Religious Services: Never    Active Member of [employer]  or Organizations: No    Attends Banker Meetings: Never    Marital Status: Divorced     Review of Systems   Gen: Denies fever, chills, anorexia. Denies fatigue, weakness, weight loss.  CV: Denies chest pain, palpitations, syncope, peripheral edema, and claudication. Resp: Denies dyspnea at rest, cough, wheezing, coughing up blood, and pleurisy. GI: See HPI Derm: Denies rash, itching, dry skin Psych: Denies depression, anxiety, memory loss, confusion. No homicidal or suicidal ideation.  Heme: Denies bruising, bleeding, and enlarged lymph nodes.  Physical Exam   BP 126/68 (BP Location: Right Arm, Patient Position: Sitting, Cuff Size: Normal)   Pulse 80   Temp (!) 96.9 F (36.1 C) (Temporal)   Ht 5\' 6"  (1.676 m)   Wt 154 lb (69.9 kg)   BMI 24.86 kg/m   General:   Alert and oriented. No distress noted. Pleasant.   Head:  Normocephalic and atraumatic. Eyes:  Conjuctiva clear without scleral icterus. Mouth:  Oral mucosa pink and moist. Good dentition. No lesions. Abdomen:  +BS, soft, non-tender and mildly-distended. No rebound or guarding. No HSM or masses noted. Rectal: Deferred Msk:  Symmetrical without gross deformities. Normal posture. Extremities:  Without edema. Neurologic:  Alert and  oriented x4. No asterixis.  Psych:  Alert and cooperative. Mood labile, intermittently tearful.   Assessment  DYANI BABEL is a 73 y.o. female with a history of cirrhosis, chronic diarrhea, chronic nausea, HLD, essential tremor, bipolar, diabetes controlled medication presenting today with complaint of weakness/falls.  Decompensated cirrhosis: - evidence of varices on EGD in September 2024.  - has had weight gain since last visit, abdomen distended but not taut today.  No need for paracentesis today. -Has previously required paracenteses.  Had been working on increasing diuretics outpatient with close monitoring of renal function.  Renal function recently was stable at 1.1 range Lasix 20 and Aldactone 50. -Albumin low, Stressed importance of protein intake.  Also expressed importance of 2 g sodium diet given currently not compliant. -Given significant fatigue, will recheck labs today including electrolytes and assess for worsening anemia in the setting of melena. Maintained on propranolol for essential tremors, heart rate 80 today.  This is supposed to serve also for variceal prophylaxis. -Discussed that given significant weakness although without hepatic encephalopathy that she should not be operating a vehicle or any heavy machinery for safety purposes. - MELD previously 11, due for MELD labs now.   IBS with diarrhea: -Well-controlled with Zenpep 60,000 units with meals -Continues to have some occasional abdominal discomfort however stools have been more formed than previously  Anemia, rectal  bleeding: -Has reported some black stools recently -denies Pepto use or iron supplementation.  Evidence of varices on prior EGD.   -Concern for increasing bruising on her skin, likely secondary to liver dysfunction -Will check CBC and INR -Given melena, concerned for bleeding/oozing from portal gastropathy versus possible variceal bleeding. -Will get scheduled for EGD ASAP. -Will continue PPI BID  PLAN   Proceed with upper endoscopy with propofol ASAP by Dr. Marletta Lor (in absence of Dr. Jena Gauss) in near future: the risks, benefits, and alternatives have been discussed with the patient in detail. The patient states understanding and desires to proceed. ASA 3 CBC, CMP, INR, AFP RUQ [place] Encouraged lactose-free protein shake nightly Continue Lasix 20 mg daily and Aldactone 100 mg daily Continue omeprazole 40 mg BID Continue Zenpep 60,000 units, 1 tablet with meals.  Monitor for constipation Limit Tylenol 2 g of diet Separate written cirrhosis  education provided. Follow up 6 weeks.     Andrea Bonito, MSN, FNP-BC, AGACNP-BC [facility]

## 2023-04-08 NOTE — Addendum Note (Signed)
 Addended by: Elinor Dodge on: 04/08/2023 04:08 PM   Modules accepted: Orders

## 2023-04-09 ENCOUNTER — Other Ambulatory Visit (HOSPITAL_COMMUNITY)
Admission: RE | Admit: 2023-04-09 | Discharge: 2023-04-09 | Disposition: A | Discharge status: Home / Self Care | Source: Ambulatory Visit | Attending: Gastroenterology | Admitting: Gastroenterology

## 2023-04-09 DIAGNOSIS — K7460 Unspecified cirrhosis of liver: Secondary | ICD-10-CM | POA: Diagnosis not present

## 2023-04-09 LAB — COMPREHENSIVE METABOLIC PANEL WITH GFR
ALT: 13 U/L (ref 0–44)
AST: 19 U/L (ref 15–41)
Albumin: 3.5 g/dL (ref 3.5–5.0)
Alkaline Phosphatase: 119 U/L (ref 38–126)
Anion gap: 11 (ref 5–15)
BUN: 15 mg/dL (ref 8–23)
CO2: 24 mmol/L (ref 22–32)
Calcium: 9.2 mg/dL (ref 8.9–10.3)
Chloride: 101 mmol/L (ref 98–111)
Creatinine, Ser: 1.32 mg/dL — ABNORMAL HIGH (ref 0.44–1.00)
GFR, Estimated: 43 mL/min — ABNORMAL LOW (ref >60)
Glucose, Bld: 208 mg/dL — ABNORMAL HIGH (ref 70–99)
Potassium: 4 mmol/L (ref 3.5–5.1)
Sodium: 136 mmol/L (ref 135–145)
Total Bilirubin: 1.1 mg/dL (ref 0.0–1.2)
Total Protein: 7.3 g/dL (ref 6.5–8.1)

## 2023-04-09 LAB — CBC
HCT: 34.4 % — ABNORMAL LOW (ref 36.0–46.0)
Hemoglobin: 11.5 g/dL — ABNORMAL LOW (ref 12.0–15.0)
MCH: 28.7 pg (ref 26.0–34.0)
MCHC: 33.4 g/dL (ref 30.0–36.0)
MCV: 85.8 fL (ref 80.0–100.0)
Platelets: 75 10*3/uL — ABNORMAL LOW (ref 150–400)
RBC: 4.01 MIL/uL (ref 3.87–5.11)
RDW: 15.3 % (ref 11.5–15.5)
WBC: 4.3 10*3/uL (ref 4.0–10.5)
nRBC: 0 % (ref 0.0–0.2)

## 2023-04-09 LAB — PROTIME-INR
INR: 1.1 (ref 0.8–1.2)
Prothrombin Time: 14.8 s (ref 11.4–15.2)

## 2023-04-10 LAB — AFP TUMOR MARKER: AFP, Serum, Tumor Marker: 2.5 ng/mL (ref 0.0–9.2)

## 2023-04-12 ENCOUNTER — Other Ambulatory Visit (HOSPITAL_COMMUNITY)

## 2023-04-12 ENCOUNTER — Other Ambulatory Visit: Payer: Self-pay | Admitting: Gastroenterology

## 2023-04-12 ENCOUNTER — Ambulatory Visit (HOSPITAL_COMMUNITY)
Admission: RE | Admit: 2023-04-12 | Discharge: 2023-04-12 | Disposition: A | Discharge status: Home / Self Care | Source: Ambulatory Visit | Attending: Gastroenterology | Admitting: Gastroenterology

## 2023-04-12 DIAGNOSIS — K7460 Unspecified cirrhosis of liver: Secondary | ICD-10-CM

## 2023-04-12 DIAGNOSIS — R188 Other ascites: Secondary | ICD-10-CM | POA: Diagnosis not present

## 2023-04-12 NOTE — Progress Notes (Signed)
Patient presents for  therapeutic and diagnostic paracentesis. US limited abdomen shows trace amount of peritoneal fluid noted  Insufficient to perform a safe paracentesis. Procedure not performed.  

## 2023-04-14 NOTE — Patient Instructions (Signed)
 Andrea Dickson  04/14/2023     @PREFPERIOPPHARMACY @   Your procedure is scheduled on  04/19/2023.   Report to [facility] at  1145 A.M.   Call this number if you have problems the morning of surgery:  [phone]  If you experience any cold or flu symptoms such as cough, fever, chills, shortness of breath, etc. between now and your scheduled surgery, please notify us at the above number.   Remember:  Follow the diet instructions given to you by the office.    You may drink clear liquids until  0945 am on 04/19/2023.    Clear liquids allowed are:                    Water, Juice (No red color; non-citric and without pulp; diabetics please choose diet or no sugar options), Carbonated beverages (diabetics please choose diet or no sugar options), Clear Tea (No creamer, milk, or cream, including half & half and powdered creamer), Black Coffee Only (No creamer, milk or cream, including half & half and powdered creamer), and Clear Sports drink (No red color; diabetics please choose diet or no sugar options)    Take these medicines the morning of surgery with A SIP OF WATER            bupropion, diazepam(if needed), omeprazole, propranolol.    Do not wear jewelry, make-up or nail polish, including gel polish,  artificial nails, or any other type of covering on natural nails (fingers and  toes).  Do not wear lotions, powders, or perfumes, or deodorant.  Do not shave 48 hours prior to surgery.  Men may shave face and neck.  Do not bring valuables to the hospital.  [facility] is not responsible for any belongings or valuables.  Contacts, dentures or bridgework may not be worn into surgery.  Leave your suitcase in the car.  After surgery it may be brought to your room.  For patients admitted to the hospital, discharge time will be determined by your treatment team.  Patients discharged the day of surgery will not be allowed to drive home and must have someone with them for 24  hours.    Special instructions:   DO NOT smoke tobacco or vape for 24 hours before your procedure.  Please read over the following fact sheets that you were given. Anesthesia Post-op Instructions and Care and Recovery After Surgery      Upper Endoscopy, Adult, Care After After the procedure, it is common to have a sore throat. It is also common to have: Mild stomach pain or discomfort. Bloating. Nausea. Follow these instructions at home: The instructions below may help you care for yourself at home. Your health care provider may give you more instructions. If you have questions, ask your health care provider. If you were given a sedative during the procedure, it can affect you for several hours. Do not drive or operate machinery until your health care provider says that it is safe. If you will be going home right after the procedure, plan to have a responsible adult: Take you home from the hospital or clinic. You will not be allowed to drive. Care for you for the time you are told. Follow instructions from your health care provider about what you may eat and drink. Return to your normal activities as told by your health care provider. Ask your health care provider what activities are safe for you. Take over-the-counter and  prescription medicines only as told by your health care provider. Contact a health care provider if you: Have a sore throat that lasts longer than one day. Have trouble swallowing. Have a fever. Get help right away if you: Vomit blood or your vomit looks like coffee grounds. Have bloody, black, or tarry stools. Have a very bad sore throat or you cannot swallow. Have difficulty breathing or very bad pain in your chest or abdomen. These symptoms may be an emergency. Get help right away. Call 911. Do not wait to see if the symptoms will go away. Do not drive yourself to the hospital. Summary After the procedure, it is common to have a sore throat, mild stomach  discomfort, bloating, and nausea. If you were given a sedative during the procedure, it can affect you for several hours. Do not drive until your health care provider says that it is safe. Follow instructions from your health care provider about what you may eat and drink. Return to your normal activities as told by your health care provider. This information is not intended to replace advice given to you by your health care provider. Make sure you discuss any questions you have with your health care provider. Document Revised: 04/09/2021 Document Reviewed: 04/09/2021 Elsevier Patient Education  2024 Elsevier Inc.General Anesthesia, Adult, Care After The following information offers guidance on how to care for yourself after your procedure. Your health care provider may also give you more specific instructions. If you have problems or questions, contact your health care provider. What can I expect after the procedure? After the procedure, it is common for people to: Have pain or discomfort at the IV site. Have nausea or vomiting. Have a sore throat or hoarseness. Have trouble concentrating. Feel cold or chills. Feel weak, sleepy, or tired (fatigue). Have soreness and body aches. These can affect parts of the body that were not involved in surgery. Follow these instructions at home: For the time period you were told by your health care provider:  Rest. Do not participate in activities where you could fall or become injured. Do not drive or use machinery. Do not drink alcohol. Do not take sleeping pills or medicines that cause drowsiness. Do not make important decisions or sign legal documents. Do not take care of children on your own. General instructions Drink enough fluid to keep your urine pale yellow. If you have sleep apnea, surgery and certain medicines can increase your risk for breathing problems. Follow instructions from your health care provider about wearing your sleep  device: Anytime you are sleeping, including during daytime naps. While taking prescription pain medicines, sleeping medicines, or medicines that make you drowsy. Return to your normal activities as told by your health care provider. Ask your health care provider what activities are safe for you. Take over-the-counter and prescription medicines only as told by your health care provider. Do not use any products that contain nicotine or tobacco. These products include cigarettes, chewing tobacco, and vaping devices, such as e-cigarettes. These can delay incision healing after surgery. If you need help quitting, ask your health care provider. Contact a health care provider if: You have nausea or vomiting that does not get better with medicine. You vomit every time you eat or drink. You have pain that does not get better with medicine. You cannot urinate or have bloody urine. You develop a skin rash. You have a fever. Get help right away if: You have trouble breathing. You have chest pain. You vomit blood.  These symptoms may be an emergency. Get help right away. Call 911. Do not wait to see if the symptoms will go away. Do not drive yourself to the hospital. Summary After the procedure, it is common to have a sore throat, hoarseness, nausea, vomiting, or to feel weak, sleepy, or fatigue. For the time period you were told by your health care provider, do not drive or use machinery. Get help right away if you have difficulty breathing, have chest pain, or vomit blood. These symptoms may be an emergency. This information is not intended to replace advice given to you by your health care provider. Make sure you discuss any questions you have with your health care provider. Document Revised: 03/28/2021 Document Reviewed: 03/28/2021 Elsevier Patient Education  2024 [employer].

## 2023-04-15 ENCOUNTER — Encounter (HOSPITAL_COMMUNITY)
Admission: RE | Admit: 2023-04-15 | Discharge: 2023-04-15 | Disposition: A | Discharge status: Home / Self Care | Source: Ambulatory Visit | Attending: Internal Medicine | Admitting: Internal Medicine

## 2023-04-15 DIAGNOSIS — F17200 Nicotine dependence, unspecified, uncomplicated: Secondary | ICD-10-CM

## 2023-04-15 DIAGNOSIS — F17210 Nicotine dependence, cigarettes, uncomplicated: Secondary | ICD-10-CM

## 2023-04-19 ENCOUNTER — Ambulatory Visit (HOSPITAL_COMMUNITY): Admitting: Anesthesiology

## 2023-04-19 ENCOUNTER — Encounter (HOSPITAL_COMMUNITY): Payer: Self-pay | Admitting: Internal Medicine

## 2023-04-19 ENCOUNTER — Encounter (HOSPITAL_COMMUNITY)
Admission: RE | Disposition: A | Discharge status: Home / Self Care | Payer: Self-pay | Source: Home / Self Care | Attending: Internal Medicine

## 2023-04-19 ENCOUNTER — Ambulatory Visit (HOSPITAL_COMMUNITY)
Admission: RE | Admit: 2023-04-19 | Discharge: 2023-04-19 | Disposition: A | Discharge status: Home / Self Care | Attending: Internal Medicine | Admitting: Internal Medicine

## 2023-04-19 DIAGNOSIS — J45909 Unspecified asthma, uncomplicated: Secondary | ICD-10-CM | POA: Diagnosis not present

## 2023-04-19 DIAGNOSIS — I8510 Secondary esophageal varices without bleeding: Secondary | ICD-10-CM | POA: Diagnosis not present

## 2023-04-19 DIAGNOSIS — D509 Iron deficiency anemia, unspecified: Secondary | ICD-10-CM | POA: Diagnosis not present

## 2023-04-19 DIAGNOSIS — K766 Portal hypertension: Secondary | ICD-10-CM | POA: Diagnosis not present

## 2023-04-19 DIAGNOSIS — K3189 Other diseases of stomach and duodenum: Secondary | ICD-10-CM | POA: Diagnosis not present

## 2023-04-19 DIAGNOSIS — K219 Gastro-esophageal reflux disease without esophagitis: Secondary | ICD-10-CM | POA: Diagnosis not present

## 2023-04-19 DIAGNOSIS — K580 Irritable bowel syndrome with diarrhea: Secondary | ICD-10-CM | POA: Diagnosis not present

## 2023-04-19 DIAGNOSIS — K648 Other hemorrhoids: Secondary | ICD-10-CM | POA: Insufficient documentation

## 2023-04-19 DIAGNOSIS — F419 Anxiety disorder, unspecified: Secondary | ICD-10-CM | POA: Insufficient documentation

## 2023-04-19 DIAGNOSIS — F17210 Nicotine dependence, cigarettes, uncomplicated: Secondary | ICD-10-CM | POA: Insufficient documentation

## 2023-04-19 DIAGNOSIS — E119 Type 2 diabetes mellitus without complications: Secondary | ICD-10-CM | POA: Diagnosis not present

## 2023-04-19 DIAGNOSIS — Z79899 Other long term (current) drug therapy: Secondary | ICD-10-CM | POA: Insufficient documentation

## 2023-04-19 DIAGNOSIS — F17200 Nicotine dependence, unspecified, uncomplicated: Secondary | ICD-10-CM

## 2023-04-19 DIAGNOSIS — I8500 Esophageal varices without bleeding: Secondary | ICD-10-CM

## 2023-04-19 DIAGNOSIS — K7460 Unspecified cirrhosis of liver: Secondary | ICD-10-CM | POA: Diagnosis not present

## 2023-04-19 HISTORY — PX: ESOPHAGOGASTRODUODENOSCOPY: SHX5428

## 2023-04-19 LAB — GLUCOSE, CAPILLARY: Glucose-Capillary: 110 mg/dL — ABNORMAL HIGH (ref 70–99)

## 2023-04-19 SURGERY — EGD (ESOPHAGOGASTRODUODENOSCOPY)
Anesthesia: General

## 2023-04-19 MED ORDER — PROPOFOL 500 MG/50ML IV EMUL
INTRAVENOUS | Status: AC
  Filled 2023-04-19: qty 50

## 2023-04-19 MED ORDER — LIDOCAINE HCL (PF) 2 % IJ SOLN
INTRAMUSCULAR | Status: DC | PRN
Start: 2023-04-19 — End: 2023-04-19
  Administered 2023-04-19: 60 mg via INTRADERMAL

## 2023-04-19 MED ORDER — PROPOFOL 10 MG/ML IV BOLUS
INTRAVENOUS | Status: DC | PRN
  Administered 2023-04-19: 70 mg via INTRAVENOUS

## 2023-04-19 MED ORDER — LACTATED RINGERS IV SOLN: INTRAVENOUS | Status: DC | PRN

## 2023-04-19 MED ORDER — LIDOCAINE HCL (PF) 2 % IJ SOLN
INTRAMUSCULAR | Status: AC
  Filled 2023-04-19: qty 5

## 2023-04-19 MED ORDER — PROPOFOL 500 MG/50ML IV EMUL
INTRAVENOUS | Status: DC | PRN
  Administered 2023-04-19: 150 ug/kg/min via INTRAVENOUS

## 2023-04-19 NOTE — Op Note (Signed)
 [facility] Patient Name: Andrea Dickson Procedure Date: 04/19/2023 2:01 PM MRN: [id number] Date of Birth: 10-Sep-1950 Attending MD: Hennie Duos. Maple Mirza, [id number] CSN: [id number] Age: 73 Admit Type: Outpatient Procedure:                Upper GI endoscopy Indications:              Iron deficiency anemia, Esophageal varices Providers:                Hennie Duos. Marletta Lor, DO, Buel Ream. Thomasena Edis RN, RN,                            Elinor Parkinson Referring MD:              Medicines:                See the Anesthesia note for documentation of the                            administered medications Complications:            No immediate complications. Estimated Blood Loss:     Estimated blood loss: none. Procedure:                Pre-Anesthesia Assessment:                           - The anesthesia plan was to use monitored                            anesthesia care (MAC).                           After obtaining informed consent, the endoscope was                            passed under direct vision. Throughout the                            procedure, the patient's blood pressure, pulse, and                            oxygen saturations were monitored continuously. The                            [id number]-H190 ([id number]) scope was introduced through the                            mouth, and advanced to the second part of duodenum.                            The upper GI endoscopy was accomplished without                            difficulty. The patient tolerated the procedure                            well. Scope  In: 2:16:45 PM Scope Out: 2:21:12 PM Total Procedure Duration: 0 hours 4 minutes 27 seconds  Findings:      Three columns of grade II varices with no bleeding and no stigmata of       recent bleeding were found in the lower third of the esophagus. No red       wale signs were present.      Mild portal hypertensive gastropathy was found in the entire examined        stomach.      The duodenal bulb, first portion of the duodenum and second portion of       the duodenum were normal. Impression:               - Grade II esophageal varices with no bleeding and                            no stigmata of recent bleeding.                           - Portal hypertensive gastropathy.                           - Normal duodenal bulb, first portion of the                            duodenum and second portion of the duodenum.                           - No specimens collected. Moderate Sedation:      Per Anesthesia Care Recommendation:           - Patient has a contact number available for                            emergencies. The signs and symptoms of potential                            delayed complications were discussed with the                            patient. Return to normal activities tomorrow.                            Written discharge instructions were provided to the                            patient.                           - Resume low sodium diet.                           - Continue present medications.                           - Patient on Propanolol for ppx without prior  bleed. Decision made to not band varices today.                           - Consider TIPS evaluation given ongoing issues                            with hypervolemia and sensitivity to diuretics.                           - Patient distended today. Will order paracentesis.                           - Follow up in GI office in 4-6 weeks                           - Consider capsule endoscopy for anemia. Procedure Code(s):        --- Professional ---                           [phone], Esophagogastroduodenoscopy, flexible,                            transoral; diagnostic, including collection of                            specimen(s) by brushing or washing, when performed                            (separate procedure) Diagnosis Code(s):         --- Professional ---                           I85.00, Esophageal varices without bleeding                           K76.6, Portal hypertension                           K31.89, Other diseases of stomach and duodenum                           D50.9, Iron deficiency anemia, unspecified CPT copyright 2022 American Medical Association. All rights reserved. The codes documented in this report are preliminary and upon coder review may  be revised to meet current compliance requirements. Hennie Duos. Marletta Lor, DO Hennie Duos. Marletta Lor, DO 04/19/2023 2:28:17 PM This report has been signed electronically. Number of Addenda: 0

## 2023-04-19 NOTE — Anesthesia Preprocedure Evaluation (Addendum)
 Anesthesia Evaluation  Patient identified by MRN, date of birth, ID band Patient awake    Reviewed: Allergy & Precautions, H&P , NPO status , Patient's Chart, lab work & pertinent test results, reviewed documented beta blocker date and time   Airway Mallampati: II  TM Distance: >3 FB Neck ROM: full    Dental  (+) Upper Dentures, Partial Lower, Dental Advisory Given   Pulmonary asthma , Current Smoker   Pulmonary exam normal breath sounds clear to auscultation       Cardiovascular Exercise Tolerance: Good negative cardio ROS Normal cardiovascular exam Rhythm:regular Rate:Normal     Neuro/Psych  Headaches PSYCHIATRIC DISORDERS Anxiety Depression Bipolar Disorder      GI/Hepatic negative GI ROS,GERD  ,,(+) Cirrhosis         Endo/Other  diabetes, Type 2    Renal/GU Renal InsufficiencyRenal disease  negative genitourinary   Musculoskeletal   Abdominal   Peds  Hematology negative hematology ROS (+)   Anesthesia Other Findings   Reproductive/Obstetrics negative OB ROS                             Anesthesia Physical Anesthesia Plan  ASA: 4 and emergent  Anesthesia Plan: General   Post-op Pain Management: Minimal or no pain anticipated   Induction: Intravenous  PONV Risk Score and Plan: Propofol infusion  Airway Management Planned: Nasal Cannula and Natural Airway  Additional Equipment:   Intra-op Plan:   Post-operative Plan:   Informed Consent: I have reviewed the patients History and Physical, chart, labs and discussed the procedure including the risks, benefits and alternatives for the proposed anesthesia with the patient or authorized representative who has indicated his/her understanding and acceptance.     Dental Advisory Given  Plan Discussed with: CRNA  Anesthesia Plan Comments:        Anesthesia Quick Evaluation

## 2023-04-19 NOTE — Discharge Instructions (Addendum)
 EGD Discharge instructions Please read the instructions outlined below and refer to this sheet in the next few weeks. These discharge instructions provide you with general information on caring for yourself after you leave the hospital. Your doctor may also give you specific instructions. While your treatment has been planned according to the most current medical practices available, unavoidable complications occasionally occur. If you have any problems or questions after discharge, please call your doctor. ACTIVITY You may resume your regular activity but move at a slower pace for the next 24 hours.  Take frequent rest periods for the next 24 hours.  Walking will help expel (get rid of) the air and reduce the bloated feeling in your abdomen.  No driving for 24 hours (because of the anesthesia (medicine) used during the test).  You may shower.  Do not sign any important legal documents or operate any machinery for 24 hours (because of the anesthesia used during the test).  NUTRITION Drink plenty of fluids.  You may resume your normal diet.  Begin with a light meal and progress to your normal diet.  Avoid alcoholic beverages for 24 hours or as instructed by your caregiver.  MEDICATIONS You may resume your normal medications unless your caregiver tells you otherwise.  WHAT YOU CAN EXPECT TODAY You may experience abdominal discomfort such as a feeling of fullness or "gas" pains.  FOLLOW-UP Your doctor will discuss the results of your test with you.  SEEK IMMEDIATE MEDICAL ATTENTION IF ANY OF THE FOLLOWING OCCUR: Excessive nausea (feeling sick to your stomach) and/or vomiting.  Severe abdominal pain and distention (swelling).  Trouble swallowing.  Temperature over 101 F (37.8 C).  Rectal bleeding or vomiting of blood.   Your upper endoscopy again showed esophageal varices though these were innocent appearing.  No evidence of bleeding.  Mild portal hypertensive gastropathy of your stomach.   Small bowel appeared normal.  I am going to have [facility] order a paracentesis.  We can consider TIPS evaluation in the near future.  Continue low-sodium diet.  Follow-up in GI office in 4 weeks.    I hope you have a great rest of your week!  Hennie Duos. Marletta Lor, D.O. Gastroenterology and Hepatology [facility]

## 2023-04-19 NOTE — Transfer of Care (Signed)
 Immediate Anesthesia Transfer of Care Note  Patient: Andrea Dickson  Procedure(s) Performed: EGD (ESOPHAGOGASTRODUODENOSCOPY)  Patient Location: Short Stay  Anesthesia Type:General  Level of Consciousness: drowsy and patient cooperative  Airway & Oxygen Therapy: Patient Spontanous Breathing  Post-op Assessment: Report given to RN and Post -op Vital signs reviewed and stable  Post vital signs: Reviewed and stable  Last Vitals:  Vitals Value Taken Time  BP    Temp 98 04/19/23    Pulse    Resp    SpO2      Last Pain:  Vitals:   04/19/23 1411  TempSrc:   PainSc: 0-No pain         Complications: No notable events documented.

## 2023-04-19 NOTE — Anesthesia Postprocedure Evaluation (Signed)
 Anesthesia Post Note  Patient: ASHAWNA HANBACK  Procedure(s) Performed: EGD (ESOPHAGOGASTRODUODENOSCOPY)  Patient location during evaluation: PACU Anesthesia Type: General Level of consciousness: awake and alert Pain management: pain level controlled Vital Signs Assessment: post-procedure vital signs reviewed and stable Respiratory status: spontaneous breathing, nonlabored ventilation, respiratory function stable and patient connected to nasal cannula oxygen Cardiovascular status: blood pressure returned to baseline and stable Postop Assessment: no apparent nausea or vomiting Anesthetic complications: no   There were no known notable events for this encounter.   Last Vitals:  Vitals:   04/19/23 1427 04/19/23 1430  BP: 106/63 103/63  Pulse: 81 81  Resp: (!) 35 (!) 32  Temp: 36.7 C   SpO2: 98% 98%    Last Pain:  Vitals:   04/19/23 1427  TempSrc: Axillary  PainSc:                  Gaetano Hawthorne

## 2023-04-19 NOTE — Interval H&P Note (Signed)
 History and Physical Interval Note:  04/19/2023 2:01 PM  THAYER INABINET  has presented today for surgery, with the diagnosis of esophageal varcies,anemia,melena.  The various methods of treatment have been discussed with the patient and family. After consideration of risks, benefits and other options for treatment, the patient has consented to  Procedure(s) with comments: EGD (ESOPHAGOGASTRODUODENOSCOPY) (N/A) - 1:45 pm, asa 3 as a surgical intervention.  The patient's history has been reviewed, patient examined, no change in status, stable for surgery.  I have reviewed the patient's chart and labs.  Questions were answered to the patient's satisfaction.     Lanelle Bal

## 2023-04-20 ENCOUNTER — Telehealth: Payer: Self-pay | Admitting: Gastroenterology

## 2023-04-20 ENCOUNTER — Encounter (HOSPITAL_COMMUNITY): Payer: Self-pay | Admitting: Internal Medicine

## 2023-04-20 ENCOUNTER — Other Ambulatory Visit: Payer: Self-pay | Admitting: *Deleted

## 2023-04-20 DIAGNOSIS — R188 Other ascites: Secondary | ICD-10-CM

## 2023-04-20 NOTE — Telephone Encounter (Signed)
 Talked with Dr. Marletta Lor regarding patient. Having worsening abdominal distention and requesting paracentesis.   Can we please schedule paracentesis for patient this week, potentially tomorrow with cell count, diff, cultures, and gram stain. Also albumin 25% - 6g per L removed.   Please also send referral to IR for TIPS consideration. Dx: esophageal varices, decompensated cirrhosis, recurrent ascites.   Brooke Bonito, MSN, APRN, FNP-BC, AGACNP-BC [facility]

## 2023-04-20 NOTE — Telephone Encounter (Addendum)
 Referral placed and message sent to [facility].    Called pt to give para appt. LMTCB. Also sent mychart message

## 2023-04-21 ENCOUNTER — Ambulatory Visit (HOSPITAL_COMMUNITY)
Admission: RE | Admit: 2023-04-21 | Discharge: 2023-04-21 | Disposition: A | Discharge status: Home / Self Care | Source: Ambulatory Visit | Attending: Gastroenterology | Admitting: Gastroenterology

## 2023-04-21 ENCOUNTER — Encounter (HOSPITAL_COMMUNITY): Payer: Self-pay

## 2023-04-21 DIAGNOSIS — K7460 Unspecified cirrhosis of liver: Secondary | ICD-10-CM | POA: Diagnosis not present

## 2023-04-21 DIAGNOSIS — R188 Other ascites: Secondary | ICD-10-CM | POA: Insufficient documentation

## 2023-04-21 LAB — BODY FLUID CELL COUNT WITH DIFFERENTIAL
Eos, Fluid: 0 %
Lymphs, Fluid: 54 %
Monocyte-Macrophage-Serous Fluid: 29 % — ABNORMAL LOW (ref 50–90)
Neutrophil Count, Fluid: 17 % (ref 0–25)
Total Nucleated Cell Count, Fluid: 346 uL (ref 0–1000)

## 2023-04-21 LAB — GRAM STAIN

## 2023-04-21 MED ORDER — ALBUMIN HUMAN 25 % IV SOLN
INTRAVENOUS | Status: AC
  Filled 2023-04-21: qty 100

## 2023-04-21 MED ORDER — ALBUMIN HUMAN 25 % IV SOLN
0.0000 g | Freq: Once | INTRAVENOUS | Status: AC
  Administered 2023-04-21: 25 g via INTRAVENOUS
  Filled 2023-04-21: qty 400

## 2023-04-21 NOTE — Progress Notes (Signed)
 Patient tolerated left sided Paracentesis and 25G of IV albumin well today and 4.8 Liters of ascites removed with labs collected and sent for processing. Patient verbalized understanding of discharge instructions and left via wheelchair with no acute distress noted.

## 2023-04-21 NOTE — Procedures (Signed)
 PROCEDURE SUMMARY:  Successful ultrasound guided paracentesis from the left lower quadrant.  Yielded 4.8 L of clear yellow fluid.  No immediate complications.  The patient tolerated the procedure well.   Specimen was sent for labs.  EBL < 5mL  The patient has a consultation on 04/28/23 with [facility] to discuss potential TIPS procedure.  Lynnette Caffey, PA-C

## 2023-04-22 ENCOUNTER — Encounter: Payer: Self-pay | Admitting: *Deleted

## 2023-04-22 LAB — PATHOLOGIST SMEAR REVIEW

## 2023-04-26 ENCOUNTER — Other Ambulatory Visit: Payer: Self-pay | Admitting: *Deleted

## 2023-04-26 DIAGNOSIS — R188 Other ascites: Secondary | ICD-10-CM

## 2023-04-26 LAB — CULTURE, BODY FLUID W GRAM STAIN -BOTTLE: Culture: NO GROWTH

## 2023-04-28 ENCOUNTER — Ambulatory Visit
Admission: RE | Admit: 2023-04-28 | Discharge: 2023-04-28 | Disposition: A | Discharge status: Home / Self Care | Source: Ambulatory Visit | Attending: Gastroenterology | Admitting: Gastroenterology

## 2023-04-28 DIAGNOSIS — K7460 Unspecified cirrhosis of liver: Secondary | ICD-10-CM | POA: Diagnosis not present

## 2023-04-28 DIAGNOSIS — R188 Other ascites: Secondary | ICD-10-CM

## 2023-04-28 DIAGNOSIS — I8510 Secondary esophageal varices without bleeding: Secondary | ICD-10-CM | POA: Diagnosis not present

## 2023-04-28 DIAGNOSIS — K766 Portal hypertension: Secondary | ICD-10-CM | POA: Diagnosis not present

## 2023-04-28 HISTORY — PX: IR RADIOLOGIST EVAL & MGMT: IMG5224

## 2023-04-28 NOTE — Consult Note (Signed)
 Chief Complaint: Patient was seen in consultation today for TIPS evaluation  Referring Physician(s): Mahon,Courtney L  History of Present Illness: Andrea Dickson is a 73 y.o. female with decompensated cirrhosis presumably from [facility].  Evidence for portal hypertension demonstrated with splenomegaly, esophageal varices and ascites.  Patient had a large volume paracentesis on 04/21/2023 where 4.8 L of fluid was removed.  Previously, the last paracentesis was on 06/17/2022 and 4.6 L of fluid was removed.  Patient underwent upper endoscopy on 04/19/2023 which demonstrated grade 2 esophageal varices with no bleeding and no stigmata of recent bleeding and portal gastropathy.  Past medical history is significant for chronic diarrhea, chronic nausea, hyperlipidemia, essential tremor, bipolar disease and frequent falls.  Patient reports difficulty using her right knee and ambulating for approximately 2 weeks.  Patient reports frequent falling of uncertain etiology.  She denies confusion or mentation problems.  She lives alone but has family close by.  She reports feeling significant fatigue in the last month.  She has been complaining of abdominal distention but only had 1 paracentesis this year.  Diuretics: Spironolactone 100 mg daily, Inderal LA 80 mg daily Lasix 40 mg daily  MELD 3.0 score is 13 Past Medical History:  Diagnosis Date   Anxiety    Asthma    Bipolar 1 disorder (HCC)    Chronic diarrhea    Cirrhosis (HCC)    Diagnosed September 2020, likely secondary to [facility], s/p Hep A/B vaccination in 2021.    Depression    Diabetes (HCC)    Essential tremor    GERD (gastroesophageal reflux disease)    Headache    High cholesterol     Past Surgical History:  Procedure Laterality Date   ABDOMINAL HYSTERECTOMY  1997   BIOPSY  01/17/2018   Procedure: BIOPSY;  Surgeon: Corbin Ade, MD;  [facility];  Service: Endoscopy;;  colon   carpal tunnel Bilateral 1999, 06/2009    COLONOSCOPY  2013   Dr. Teena Dunk: no colon polyps. quality of prep was fair. repeat colonoscopy in five years.    COLONOSCOPY WITH PROPOFOL N/A 01/17/2018   Dr. Jena Gauss: Colonic lipoma, 2 tubular adenomas removed.  Random colon biopsies negative.  Next colonoscopy 5 years.   COLONOSCOPY WITH PROPOFOL N/A 09/26/2022   Procedure: COLONOSCOPY WITH PROPOFOL;  Surgeon: Dolores Frame, MD;  [facility];  Service: Gastroenterology;  Laterality: N/A;   ESOPHAGOGASTRODUODENOSCOPY N/A 04/19/2023   Procedure: EGD (ESOPHAGOGASTRODUODENOSCOPY);  Surgeon: Lanelle Bal, DO;  [facility];  Service: Endoscopy;  Laterality: N/A;  1:45 pm, asa 3   ESOPHAGOGASTRODUODENOSCOPY (EGD) WITH PROPOFOL N/A 04/06/2019   Procedure: ESOPHAGOGASTRODUODENOSCOPY (EGD) WITH PROPOFOL;  Surgeon: Corbin Ade, MD;   Normal esophagus, mild portal hypertensive gastropathy, otherwise normal exam.  Recommend repeat EGD in 2 years for variceal screening.    ESOPHAGOGASTRODUODENOSCOPY (EGD) WITH PROPOFOL N/A 01/19/2022   Procedure: ESOPHAGOGASTRODUODENOSCOPY (EGD) WITH PROPOFOL;  Surgeon: Corbin Ade, MD;  [facility];  Service: Endoscopy;  Laterality: N/A;  12:45 pm, pt knows to arrive at 9:15   ESOPHAGOGASTRODUODENOSCOPY (EGD) WITH PROPOFOL N/A 09/25/2022   Procedure: ESOPHAGOGASTRODUODENOSCOPY (EGD) WITH PROPOFOL;  Surgeon: Dolores Frame, MD;  [facility];  Service: Gastroenterology;  Laterality: N/A;   POLYPECTOMY  01/17/2018   Procedure: POLYPECTOMY;  Surgeon: Corbin Ade, MD;  [facility];  Service: Endoscopy;;  colon   POLYPECTOMY  09/26/2022   Procedure: POLYPECTOMY;  Surgeon: Marguerita Merles,  Bearl Limes, MD;  [facility];  Service: Gastroenterology;;   SKIN CANCER EXCISION  2007   TONSILLECTOMY  1965   ulner nerve  Left 06/2009   decompression    Allergies: Pramipexole, Crestor [rosuvastatin calcium], Bee pollen, and Pollen  extract  Medications: Prior to Admission medications   Medication Sig Start Date End Date Taking? Authorizing Provider  acetaminophen (TYLENOL) 500 MG tablet Take 1,000 mg by mouth 2 (two) times daily as needed for moderate pain or headache.    [provider]  amoxicillin (AMOXIL) 500 MG capsule Take 1,000 mg by mouth 3 (three) times daily. 12/28/22   [provider]  buPROPion (WELLBUTRIN XL) 300 MG 24 hr tablet Take 1 tablet (300 mg total) by mouth every morning. 03/30/23 03/29/24  Alysia Bachelor, MD  cetirizine (ZYRTEC) 10 MG tablet Take 10 mg by mouth daily. Aller-Tec    [provider]  Cholecalciferol (VITAMIN D3) 50 MCG (2000 UT) TABS Take 2,000 Units by mouth daily.     [provider]  diazepam (VALIUM) 10 MG tablet Take 1-2 tablets (10-20 mg total) by mouth at bedtime. 03/30/23   Alysia Bachelor, MD  diazepam (VALIUM) 2 MG tablet Take 1 tablet (2 mg total) by mouth daily as needed for anxiety. 03/30/23 03/29/24  Alysia Bachelor, MD  estrogens, conjugated, (PREMARIN) 1.25 MG tablet Take 1.25 mg by mouth daily.     [provider]  ezetimibe (ZETIA) 10 MG tablet Take 10 mg by mouth daily. 10/19/19   [provider]  fluticasone (FLONASE) 50 MCG/ACT nasal spray Place 2 sprays into both nostrils daily. 09/03/22   [provider]  furosemide (LASIX) 40 MG tablet Take 1 tablet (40 mg total) by mouth daily. 02/15/23 02/15/24  Eustacio Highman, NP  hyoscyamine (LEVSIN SL) 0.125 MG SL tablet Place 1 tablet (0.125 mg total) under the tongue every 6 (six) hours as needed for cramping. 09/17/22   Delman Ferns, NP  Lactobacillus-Inulin (CULTURELLE DIGESTIVE HEALTH) CAPS Take 1 capsule by mouth daily. 11/10/21   [provider]  nicotine (NICODERM CQ - DOSED IN MG/24 HOURS) 14 mg/24hr patch Place 1 patch (14 mg total) onto the skin daily. 09/27/22   Justina Oman, MD  omeprazole (PRILOSEC) 40 MG capsule TAKE 1 CAPSULE BY MOUTH AT BREAKFAST  AND AT BEDTIME *REFILL REQUEST* 09/10/22   Delman Ferns, NP  ondansetron (ZOFRAN) 4 MG tablet TAKE 1 TABLET BY MOUTH EVERY 8 HOURS AS NEEDED FOR NAUSEA & VOMITING 12/30/22   Eustacio Highman, NP  Pancrelipase, Lip-Prot-Amyl, (ZENPEP) 60000-189600 units CPEP Take 1 capsule by mouth 3 (three) times daily with meals. 02/01/23   Eustacio Highman, NP  propranolol ER (INDERAL LA) 80 MG 24 hr capsule Take 1 capsule (80 mg total) by mouth daily. 03/30/23 03/29/24  Alysia Bachelor, MD  spironolactone (ALDACTONE) 100 MG tablet Take 1 tablet (100 mg total) by mouth daily. 02/15/23 02/15/24  Eustacio Highman, NP  trimethoprim (TRIMPEX) 100 MG tablet Take 1 tablet (100 mg total) by mouth at bedtime. 01/14/23   Lauretta Ponto, FNP     Family History  Problem Relation Age of Onset   Diabetes Mother    Brain cancer Mother    Diabetes Father    Heart attack Father    Heart disease Father    Diabetes Sister    Bipolar disorder Brother    Diabetes Brother    Heart disease Brother    Colon  cancer Neg Hx    Breast cancer Neg Hx     Social History   Socioeconomic History   Marital status: Divorced    Spouse name: Not on file   Number of children: 1   Years of education: 12   Highest education level: Not on file  Occupational History   Occupation: retired    Comment: employed at [employer]  Tobacco Use   Smoking status: Every Day    Current packs/day: 0.50    Average packs/day: 0.5 packs/day for 43.0 years (21.5 ttl pk-yrs)    Types: Cigarettes   Smokeless tobacco: Never   Tobacco comments:    smoking since 73yrs old.    Vaping Use   Vaping status: Never Used  Substance and Sexual Activity   Alcohol use: No    Alcohol/week: 0.0 standard drinks of alcohol   Drug use: No   Sexual activity: Not Currently  Other Topics Concern   Not on file  Social History Narrative   Lives alone.  Retired.  Education 12th grade.  One child.     Caffeine use- sodas, 2 daily   Social Drivers of Health    Financial Resource Strain: Low Risk  (11/10/2021)   Received from [facility], [facility]   Overall Financial Resource Strain (CARDIA)    Difficulty of Paying Living Expenses: Not hard at all  Food Insecurity: No Food Insecurity (09/25/2022)   Hunger Vital Sign    Worried About Running Out of Food in the Last Year: Never true    Ran Out of Food in the Last Year: Never true  Transportation Needs: No Transportation Needs (09/25/2022)   PRAPARE - Administrator, Civil Service (Medical): No    Lack of Transportation (Non-Medical): No  Physical Activity: Inactive (11/10/2021)   Received from [facility], [facility]   Exercise Vital Sign    Days of Exercise per Week: 0 days    Minutes of Exercise per Session: 0 min  Stress: No Stress Concern Present (11/10/2021)   Received from [facility], [facility] - Occupational Stress Questionnaire    Feeling of Stress : Not at all  Social Connections: Socially Isolated (11/10/2021)   Received from [facility], [facility]   Social Connection and Isolation Panel [NHANES]    Frequency of Communication with Friends and Family: More than three times a week    Frequency of Social Gatherings with Friends and Family: Once a week    Attends Religious Services: Never    Database administrator or Organizations: No    Attends Engineer, structural: Never    Marital Status: Divorced    ECOG Status: 1 - Symptomatic but completely ambulatory   Review of Systems  Constitutional:  Positive for fatigue.  Respiratory: Negative.    Cardiovascular:  Negative for chest pain and leg swelling.  Gastrointestinal:  Positive for abdominal distention, abdominal pain and diarrhea.  Genitourinary: Negative.   Neurological:  Positive for tremors.    Vital Signs: BP 133/80   Pulse 80   Temp 97.7 F (36.5 C) (Oral)   Resp 17   SpO2 96%     Physical  Exam Constitutional:      Appearance: She is not ill-appearing.  Cardiovascular:     Rate and Rhythm: Normal rate and regular rhythm.  Pulmonary:     Effort: Pulmonary effort is normal.  Breath sounds: Normal breath sounds.  Abdominal:     Palpations: Abdomen is soft.     Tenderness: There is no abdominal tenderness.     Comments: Mild abdominal distention  Musculoskeletal:     Right lower leg: No edema.     Left lower leg: No edema.  Skin:    Findings: Bruising present.     Comments: Bruising in both arms  Neurological:     Mental Status: She is alert.     Comments: Tremors noticed in hands       Imaging: US Paracentesis Result Date: 04/21/2023 INDICATION: Patient with history of cirrhosis, esophageal varices, recurrent ascites. Request for diagnostic and therapeutic paracentesis. EXAM: ULTRASOUND GUIDED DIAGNOSTIC AND THERAPEUTIC PARACENTESIS MEDICATIONS: 7 mL 1% lidocaine COMPLICATIONS: None immediate. PROCEDURE: Informed written consent was obtained from the patient after a discussion of the risks, benefits and alternatives to treatment. A timeout was performed prior to the initiation of the procedure. Initial ultrasound scanning demonstrates a large amount of ascites within the left lower abdominal quadrant. The left lower abdomen was prepped and draped in the usual sterile fashion. 1% lidocaine was used for local anesthesia. Following this, a 19 gauge, 7-cm, Yueh catheter was introduced. An ultrasound image was saved for documentation purposes. The paracentesis was performed. The catheter was removed and a dressing was applied. The patient tolerated the procedure well without immediate post procedural complication. Patient received post-procedure intravenous albumin; see nursing notes for details. FINDINGS: A total of approximately 4.8 L of clear yellow fluid was removed. Samples were sent to the laboratory as requested by the clinical team. IMPRESSION: Successful ultrasound-guided  paracentesis yielding 4.8 liters of peritoneal fluid. Performed by Lynnette Caffey, PA-C PLAN: The patient has a formal evaluation with the [facility] on 04/28/23 to discuss possible TIPS procedure. Electronically Signed   By: Gilmer Mor D.O.   On: 04/21/2023 16:09   [place] ASCITES (ABDOMEN LIMITED) Result Date: 04/12/2023 CLINICAL DATA:  Assess for ascites. EXAM: ULTRASOUND ABDOMEN LIMITED COMPARISON:  None Available. FINDINGS: Small amount of ascites is identified in 4 quadrants. IMPRESSION: Small amount of ascites is identified in 4 quadrants. Electronically Signed   By: Sherian Rein M.D.   On: 04/12/2023 09:22   US ABDOMEN LIMITED RUQ (LIVER/GB) Result Date: 04/12/2023 CLINICAL DATA:  Liver cirrhosis EXAM: ULTRASOUND ABDOMEN LIMITED RIGHT UPPER QUADRANT COMPARISON:  February 19, 2023 FINDINGS: Gallbladder: No gallstones or wall thickening visualized. No sonographic Murphy sign noted by sonographer. Common bile duct: Diameter: 2.9 mm Liver: No focal lesion. Nodular contour with increased echotexture. Portal vein is patent on color Doppler imaging with normal direction of blood flow towards the liver. Other: None. IMPRESSION: 1. No acute abnormality identified. 2. Nodular contour with increased echotexture of the liver, consistent with cirrhosis. Electronically Signed   By: Sherian Rein M.D.   On: 04/12/2023 09:21    Labs:  CBC: Recent Labs    09/24/22 2236 09/25/22 0547 09/26/22 0522 04/09/23 1135  WBC 3.0* 2.4* 2.6* 4.3  HGB 11.1* 10.5* 10.5* 11.5*  HCT 32.9* 32.2* 31.0* 34.4*  PLT 45* 43* 44* 75*    COAGS: Recent Labs    09/17/22 1026 09/24/22 1357 09/25/22 0823 04/09/23 1135  INR 1.1 1.2 1.3* 1.1    BMP: Recent Labs    09/26/22 0522 02/23/23 1103 03/31/23 1112 04/09/23 1135  NA 136 137 136 136  K 4.2 4.7 4.7 4.0  CL 107 107 109 101  CO2 20* 22 19* 24  GLUCOSE 114* 122* 149* 208*  BUN 10 17 16 15   CALCIUM 8.0*  8.7* 8.8* 9.2  CREATININE 0.88 1.11* 1.16* 1.32*  GFRNONAA >60 53* 50* 43*    LIVER FUNCTION TESTS: Recent Labs    09/25/22 0547 02/23/23 1103 03/31/23 1112 04/09/23 1135  BILITOT 1.0 1.2 1.1 1.1  AST 21 20 27 19   ALT 14 14 19 13   ALKPHOS 81 87 103 119  PROT 6.0* 7.1 7.3 7.3  ALBUMIN 3.3* 3.6 3.7 3.5    TUMOR MARKERS: No results for input(s): "AFPTM", "CEA", "CA199", "CHROMGRNA" in the last 8760 hours.  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   Evidence for portal hypertension with splenomegaly, ascites and grade 2 esophagus varices.  In addition, patient has thrombocytopenia.  IR was asked to evaluate the patient for potential TIPS procedure.  We discussed her liver disease and the portal hypertension.  Explained how the esophageal varices and ascites were related to the cirrhosis and portal hypertension.  We discussed treatment options including a TIPS procedure for portal vein decompression.  I believe the patient has a very good understanding of her disease and situation.  Patient's biggest complaint is abdominal distention and recent fatigue.  She recently had a large-volume paracentesis greater than 4 L but this is her first paracentesis since June 2024.  We discussed how a TIPS procedure could decrease her abdominal ascites and decrease the risk of variceal bleeding.  We discussed the risks and potential complications of a TIPS procedure including the intraprocedure complications of bleeding and the postprocedure complications including hepatic encephalopathy and decreased liver function and potential liver failure.  At this time, the patient's liver synthetic function is very good and her MELD score is 13.  Main portal vein was patent on recent US  from 04/12/23. She would be a candidate for a TIPS procedure based on her MELD score and anatomy.  Patient would like to think about the procedure and see how quickly the ascites re-accumulates and discuss with her  family.   We will plan for follow-up visit in 3 months to evaluate the situation.  Thank you for this interesting consult.  I greatly enjoyed meeting KYNLI CHOU and look forward to participating in their care.  A copy of this report was sent to the requesting provider on this date.  Electronically Signed: Salbador Crate 04/28/2023, 3:38 PM   I spent a total of  40 Minutes   in face to face in clinical consultation, greater than 50% of which was counseling/coordinating care for decompensated cirrhosis.

## 2023-05-04 ENCOUNTER — Telehealth (INDEPENDENT_AMBULATORY_CARE_PROVIDER_SITE_OTHER): Admitting: Psychiatry

## 2023-05-04 ENCOUNTER — Telehealth (HOSPITAL_COMMUNITY): Payer: Self-pay

## 2023-05-04 ENCOUNTER — Encounter (HOSPITAL_COMMUNITY): Payer: Self-pay | Admitting: Psychiatry

## 2023-05-04 DIAGNOSIS — F3162 Bipolar disorder, current episode mixed, moderate: Secondary | ICD-10-CM

## 2023-05-04 MED ORDER — DIAZEPAM 2 MG PO TABS: 2.0000 mg | ORAL_TABLET | Freq: Every day | ORAL | 2 refills | Status: DC | PRN

## 2023-05-04 MED ORDER — BUPROPION HCL ER (XL) 150 MG PO TB24
150.0000 mg | ORAL_TABLET | ORAL | 2 refills | Status: DC
Start: 2023-05-04 — End: 2023-06-01

## 2023-05-04 MED ORDER — PROPRANOLOL HCL ER 80 MG PO CP24: 80.0000 mg | ORAL_CAPSULE | Freq: Every day | ORAL | 2 refills | Status: DC

## 2023-05-04 MED ORDER — DIAZEPAM 10 MG PO TABS: 10.0000 mg | ORAL_TABLET | Freq: Every day | ORAL | 2 refills | Status: DC

## 2023-05-04 NOTE — Telephone Encounter (Signed)
 Pt scheduled today at 1040

## 2023-05-04 NOTE — Telephone Encounter (Signed)
 Pt called in stating since starting the buPROPion  (WELLBUTRIN  XL) 300 MG 24 hr tablet her legs have started to give out, she has had to use a walker and a cane. That's that her legs have been shaking and she can barely walk across the floor. She states Dr Avanell Bob had mentioned that with the propanol there could be side affects. Pt is scheduled 05/07/23. Please advise.

## 2023-05-04 NOTE — Telephone Encounter (Signed)
 Can you see if she can do virtual visit between now and 11 since we had a cancellation?

## 2023-05-04 NOTE — Progress Notes (Signed)
 Virtual Visit via Telephone Note  I connected with Andrea Dickson on 05/04/23 at 10:40 AM EDT by telephone and verified that I am speaking with the correct person using two identifiers.  Location: Patient: home Provider: office   I discussed the limitations, risks, security and privacy concerns of performing an evaluation and management service by telephone and the availability of in person appointments. I also discussed with the patient that there may be a patient responsible charge related to this service. The patient expressed understanding and agreed to proceed.      I discussed the assessment and treatment plan with the patient. The patient was provided an opportunity to ask questions and all were answered. The patient agreed with the plan and demonstrated an understanding of the instructions.   The patient was advised to call back or seek an in-person evaluation if the symptoms worsen or if the condition fails to improve as anticipated.  I provided 20 minutes of non-face-to-face time during this encounter.   Alfredia Annas, MD  [facility] MD/PA/NP OP Progress Note  05/04/2023 10:59 AM Andrea Dickson  MRN:  [id number]  Chief Complaint:  Chief Complaint  Patient presents with   Depression   Anxiety   Follow-up   HPI: This patient is a 73 year old separated white female who lives alone in [place]. She has 1 son and 3 grandchildren. She is retired from [employer]. She has a history of depression anxiety and possible bipolar disorder.  The patient returns for follow-up after about 4 weeks as a work in today.  This is regarding her depression anxiety insomnia and tremor.  She had called earlier today stating that her mood legs have gotten very weak and shaky and were "bending backwards."  She has had to use a walker or cane.  This is since we increased the Wellbutrin  XL from 150 mg to 300 mg last month.  She does not remember having as much trouble walking before the increase.  The  reason we increased it is because she was having more depressive symptoms and a lot of stress regarding her sibling's behavior.  She states her brother was trying to take over the family home.  She does feel somewhat less depressed but it is not worth the side effect of the shakiness and gait instability.  I suggested we go back to the Wellbutrin  XL 150 and then if this does not work we will need to make other adjustments.  She has tried other SSRIs which also caused shakiness.  She is still having significant problem with her nonalcoholic liver disease and associated cirrhosis and ascites.  She just had an EGD which did not show any new varices.  However they are looking at maybe doing some sort of surgery to relieve the pressure in the liver.  Right now she is having to have the pressure relieved every 2 weeks  Visit Diagnosis:    ICD-10-CM   1. Bipolar 1 disorder, mixed, moderate (HCC)  F31.62       Past Psychiatric History: Past outpatient treatment  Past Medical History:  Past Medical History:  Diagnosis Date   Anxiety    Asthma    Bipolar 1 disorder (HCC)    Chronic diarrhea    Cirrhosis (HCC)    Diagnosed September 2020, likely secondary to [facility], s/p Hep A/B vaccination in 2021.    Depression    Diabetes (HCC)    Essential tremor    GERD (gastroesophageal reflux disease)    Headache  High cholesterol     Past Surgical History:  Procedure Laterality Date   ABDOMINAL HYSTERECTOMY  1997   BIOPSY  01/17/2018   Procedure: BIOPSY;  Surgeon: Suzette Espy, MD;  [facility];  Service: Endoscopy;;  colon   carpal tunnel Bilateral 1999, 06/2009   COLONOSCOPY  2013   Dr. Alline Ivans: no colon polyps. quality of prep was fair. repeat colonoscopy in five years.    COLONOSCOPY WITH PROPOFOL  N/A 01/17/2018   Dr. Riley Cheadle: Colonic lipoma, 2 tubular adenomas removed.  Random colon biopsies negative.  Next colonoscopy 5 years.   COLONOSCOPY WITH PROPOFOL  N/A 09/26/2022   Procedure:  COLONOSCOPY WITH PROPOFOL ;  Surgeon: Urban Garden, MD;  [facility];  Service: Gastroenterology;  Laterality: N/A;   ESOPHAGOGASTRODUODENOSCOPY N/A 04/19/2023   Procedure: EGD (ESOPHAGOGASTRODUODENOSCOPY);  Surgeon: Vinetta Greening, DO;  [facility];  Service: Endoscopy;  Laterality: N/A;  1:45 pm, asa 3   ESOPHAGOGASTRODUODENOSCOPY (EGD) WITH PROPOFOL  N/A 04/06/2019   Procedure: ESOPHAGOGASTRODUODENOSCOPY (EGD) WITH PROPOFOL ;  Surgeon: Suzette Espy, MD;   Normal esophagus, mild portal hypertensive gastropathy, otherwise normal exam.  Recommend repeat EGD in 2 years for variceal screening.    ESOPHAGOGASTRODUODENOSCOPY (EGD) WITH PROPOFOL  N/A 01/19/2022   Procedure: ESOPHAGOGASTRODUODENOSCOPY (EGD) WITH PROPOFOL ;  Surgeon: Suzette Espy, MD;  [facility];  Service: Endoscopy;  Laterality: N/A;  12:45 pm, pt knows to arrive at 9:15   ESOPHAGOGASTRODUODENOSCOPY (EGD) WITH PROPOFOL  N/A 09/25/2022   Procedure: ESOPHAGOGASTRODUODENOSCOPY (EGD) WITH PROPOFOL ;  Surgeon: Urban Garden, MD;  [facility];  Service: Gastroenterology;  Laterality: N/A;   POLYPECTOMY  01/17/2018   Procedure: POLYPECTOMY;  Surgeon: Suzette Espy, MD;  [facility];  Service: Endoscopy;;  colon   POLYPECTOMY  09/26/2022   Procedure: POLYPECTOMY;  Surgeon: Urban Garden, MD;  [facility];  Service: Gastroenterology;;   SKIN CANCER EXCISION  2007   TONSILLECTOMY  1965   ulner nerve  Left 06/2009   decompression    Family Psychiatric History: See below  Family History:  Family History  Problem Relation Age of Onset   Diabetes Mother    Brain cancer Mother    Diabetes Father    Heart attack Father    Heart disease Father    Diabetes Sister    Bipolar disorder Brother    Diabetes Brother    Heart disease Brother    Colon cancer Neg Hx    Breast cancer Neg Hx     Social History:  Social History    Socioeconomic History   Marital status: Divorced    Spouse name: Not on file   Number of children: 1   Years of education: 12   Highest education level: Not on file  Occupational History   Occupation: retired    Comment: employed at [employer]  Tobacco Use   Smoking status: Every Day    Current packs/day: 0.50    Average packs/day: 0.5 packs/day for 43.0 years (21.5 ttl pk-yrs)    Types: Cigarettes   Smokeless tobacco: Never   Tobacco comments:    smoking since 73yrs old.    Vaping Use   Vaping status: Never Used  Substance and Sexual Activity   Alcohol use: No    Alcohol/week: 0.0 standard drinks of alcohol   Drug use: No   Sexual activity: Not Currently  Other Topics Concern   Not on file  Social History Narrative  Lives alone.  Retired.  Education 12th grade.  One child.     Caffeine use- sodas, 2 daily   Social Drivers of Health   Financial Resource Strain: Low Risk  (11/10/2021)   Received from [facility], [facility]   Overall Financial Resource Strain (CARDIA)    Difficulty of Paying Living Expenses: Not hard at all  Food Insecurity: No Food Insecurity (09/25/2022)   Hunger Vital Sign    Worried About Running Out of Food in the Last Year: Never true    Ran Out of Food in the Last Year: Never true  Transportation Needs: No Transportation Needs (09/25/2022)   PRAPARE - Administrator, Civil Service (Medical): No    Lack of Transportation (Non-Medical): No  Physical Activity: Inactive (11/10/2021)   [facility], [facility]   Exercise Vital Sign    Days of Exercise per Week: 0 days    Minutes of Exercise per Session: 0 min  Stress: No Stress Concern Present (11/10/2021)   Received from [facility], [facility] - Occupational Stress Questionnaire    Feeling of Stress : Not at all  Social Connections: Socially Isolated (11/10/2021)   Received from [facility], [facility]   Social Connection and Isolation Panel [NHANES]    Frequency of Communication with Friends and Family: More than three times a week    Frequency of Social Gatherings with Friends and Family: Once a week    Attends Religious Services: Never    Database administrator or Organizations: No    Attends Banker Meetings: Never    Marital Status: Divorced    Allergies:  Allergies  Allergen Reactions   Pramipexole Other (See Comments)    Shaking, palpitations, headache, faint feeling   Crestor [Rosuvastatin Calcium ]     Muscle pain   Bee Pollen Other (See Comments)    Eyes and nose run   Pollen Extract Other (See Comments)    Eyes and nose run    Metabolic Disorder Labs: Lab Results  Component Value Date   HGBA1C 5.4 04/24/2015   MPG 108 04/24/2015   No results found for: "PROLACTIN" Lab Results  Component Value Date   CHOL 178 02/17/2016   TRIG 229 (H) 02/17/2016   HDL 30 (L) 02/17/2016   CHOLHDL 5.9 (H) 02/17/2016   VLDL 46 (H) 02/17/2016   LDLCALC 102 (H) 02/17/2016   LDLCALC 126 (H) 11/18/2015   Lab Results  Component Value Date   TSH 1.00 12/24/2021   TSH 2.96 08/29/2019    Therapeutic Level Labs: Lab Results  Component Value Date   LITHIUM  0.8 08/29/2019   LITHIUM  0.4 (L) 10/13/2018   Lab Results  Component Value Date   VALPROATE 62.2 08/20/2016   VALPROATE 83.0 10/14/2015   No results found for: "CBMZ"  Current Medications: Current Outpatient Medications  Medication Sig Dispense Refill   buPROPion  (WELLBUTRIN  XL) 150 MG 24 hr tablet Take 1 tablet (150 mg total) by mouth every morning. 30 tablet 2   acetaminophen  (TYLENOL ) 500 MG tablet Take 1,000 mg by mouth 2 (two) times daily as needed for moderate pain or headache.     amoxicillin  (AMOXIL ) 500 MG capsule Take 1,000 mg by mouth 3 (three) times daily.     cetirizine (ZYRTEC) 10 MG tablet Take 10 mg by mouth daily. Aller-Tec     Cholecalciferol (VITAMIN D3) 50  MCG  (2000 UT) TABS Take 2,000 Units by mouth daily.      diazepam  (VALIUM ) 10 MG tablet Take 1-2 tablets (10-20 mg total) by mouth at bedtime. 60 tablet 2   diazepam  (VALIUM ) 2 MG tablet Take 1 tablet (2 mg total) by mouth daily as needed for anxiety. 30 tablet 2   estrogens , conjugated, (PREMARIN ) 1.25 MG tablet Take 1.25 mg by mouth daily.      ezetimibe (ZETIA) 10 MG tablet Take 10 mg by mouth daily.     fluticasone (FLONASE) 50 MCG/ACT nasal spray Place 2 sprays into both nostrils daily.     furosemide  (LASIX ) 40 MG tablet Take 1 tablet (40 mg total) by mouth daily. 30 tablet 11   hyoscyamine  (LEVSIN SL) 0.125 MG SL tablet Place 1 tablet (0.125 mg total) under the tongue every 6 (six) hours as needed for cramping. 30 tablet 1   Lactobacillus-Inulin (CULTURELLE DIGESTIVE HEALTH) CAPS Take 1 capsule by mouth daily.     nicotine  (NICODERM CQ  - DOSED IN MG/24 HOURS) 14 mg/24hr patch Place 1 patch (14 mg total) onto the skin daily. 28 patch 0   omeprazole  (PRILOSEC) 40 MG capsule TAKE 1 CAPSULE BY MOUTH AT BREAKFAST AND AT BEDTIME *REFILL REQUEST* 60 capsule 10   ondansetron  (ZOFRAN ) 4 MG tablet TAKE 1 TABLET BY MOUTH EVERY 8 HOURS AS NEEDED FOR NAUSEA & VOMITING 30 tablet 10   Pancrelipase , Lip-Prot-Amyl, (ZENPEP ) 60000-189600 units CPEP Take 1 capsule by mouth 3 (three) times daily with meals. 100 capsule 11   propranolol  ER (INDERAL  LA) 80 MG 24 hr capsule Take 1 capsule (80 mg total) by mouth daily. 90 capsule 2   spironolactone  (ALDACTONE ) 100 MG tablet Take 1 tablet (100 mg total) by mouth daily. 30 tablet 11   trimethoprim  (TRIMPEX ) 100 MG tablet Take 1 tablet (100 mg total) by mouth at bedtime. 30 tablet 11   No current facility-administered medications for this visit.     Musculoskeletal: Strength & Muscle Tone: na Gait & Station: unsteady Patient leans: N/A  Psychiatric Specialty Exam: Review of Systems  Gastrointestinal:  Positive for abdominal distention.  Musculoskeletal:   Positive for gait problem.  Neurological:  Positive for tremors and weakness.  All other systems reviewed and are negative.   There were no vitals taken for this visit.There is no height or weight on file to calculate BMI.  General Appearance: NA  Eye Contact:  NA  Speech:  Clear and Coherent  Volume:  Normal  Mood:  Anxious and Euthymic  Affect:  NA  Thought Process:  Goal Directed  Orientation:  Full (Time, Place, and Person)  Thought Content: Rumination   Suicidal Thoughts:  No  Homicidal Thoughts:  No  Memory:  Immediate;   Good Recent;   Good Remote;   Fair  Judgement:  Good  Insight:  Good  Psychomotor Activity:  Tremor  Concentration:  Concentration: Good and Attention Span: Good  Recall:  Good  Fund of Knowledge: Good  Language: Good  Akathisia:  No  Handed:  Right  AIMS (if indicated): not done  Assets:  Communication Skills Desire for Improvement Resilience Social Support  ADL's:  Intact  Cognition: WNL  Sleep:  Good   Screenings: PHQ2-9    Flowsheet Row Video Visit from 09/30/2021 in [facility] [place] Video Visit from 06/26/2021 in [facility] at [place] Video Visit from 03/26/2021 in [facility] at [place] Video Visit from  12/18/2020 in [facility] at [place] Video Visit from 09/24/2020 in [facility] at [facility] Total Score 0 0 0 0 0      Flowsheet Row Admission (Discharged) from 04/19/2023 in [place] ENDOSCOPY ED to Hosp-Admission (Discharged) from 09/24/2022 in [facility] MEDICAL SURGICAL UNIT ED from 07/25/2022 in [facility] Emergency Department at [facility]  C-SSRS RISK CATEGORY No Risk No Risk No Risk        Assessment and Plan: This patient is a 73 year old female with a history of bipolar disorder and anxiety tremor nonalcoholic liver disease with associated ascites and  varices.  Since we increased the Wellbutrin  she has been become unsteady and shaky so we will cut it back to Wellbutrin  XL 150 mg daily for depression.  She will continue Valium  2 mg once daily as needed for anxiety, Valium  10 to 20 mg at bedtime for sleep and anxiety and Inderal  LA 80 mg every morning for tremor.  She will return to see me in 4 weeks  Collaboration of Care: Collaboration of Care: Other provider involved in patient's care AEB notes are shared with GI on the epic system  Patient/Guardian was advised Release of Information must be obtained prior to any record release in order to collaborate their care with an outside provider. Patient/Guardian was advised if they have not already done so to contact the registration department to sign all necessary forms in order for us  to release information regarding their care.   Consent: Patient/Guardian gives verbal consent for treatment and assignment of benefits for services provided during this visit. Patient/Guardian expressed understanding and agreed to proceed.    Alfredia Annas, MD 05/04/2023, 10:59 AM

## 2023-05-07 ENCOUNTER — Telehealth (HOSPITAL_COMMUNITY): Admitting: Psychiatry

## 2023-05-13 ENCOUNTER — Encounter: Payer: Self-pay | Admitting: *Deleted

## 2023-05-24 DIAGNOSIS — K766 Portal hypertension: Secondary | ICD-10-CM | POA: Diagnosis not present

## 2023-05-24 DIAGNOSIS — D649 Anemia, unspecified: Secondary | ICD-10-CM | POA: Diagnosis not present

## 2023-05-24 DIAGNOSIS — E7849 Other hyperlipidemia: Secondary | ICD-10-CM | POA: Diagnosis not present

## 2023-05-24 DIAGNOSIS — R739 Hyperglycemia, unspecified: Secondary | ICD-10-CM | POA: Diagnosis not present

## 2023-05-24 DIAGNOSIS — N1831 Chronic kidney disease, stage 3a: Secondary | ICD-10-CM | POA: Diagnosis not present

## 2023-05-27 ENCOUNTER — Other Ambulatory Visit (HOSPITAL_COMMUNITY): Payer: Self-pay | Admitting: Psychiatry

## 2023-05-31 DIAGNOSIS — E1165 Type 2 diabetes mellitus with hyperglycemia: Secondary | ICD-10-CM | POA: Diagnosis not present

## 2023-05-31 DIAGNOSIS — Z9189 Other specified personal risk factors, not elsewhere classified: Secondary | ICD-10-CM | POA: Diagnosis not present

## 2023-05-31 DIAGNOSIS — D692 Other nonthrombocytopenic purpura: Secondary | ICD-10-CM | POA: Diagnosis not present

## 2023-05-31 DIAGNOSIS — N390 Urinary tract infection, site not specified: Secondary | ICD-10-CM | POA: Diagnosis not present

## 2023-05-31 DIAGNOSIS — Z0001 Encounter for general adult medical examination with abnormal findings: Secondary | ICD-10-CM | POA: Diagnosis not present

## 2023-05-31 DIAGNOSIS — K589 Irritable bowel syndrome without diarrhea: Secondary | ICD-10-CM | POA: Diagnosis not present

## 2023-05-31 DIAGNOSIS — Z1389 Encounter for screening for other disorder: Secondary | ICD-10-CM | POA: Diagnosis not present

## 2023-05-31 DIAGNOSIS — I10 Essential (primary) hypertension: Secondary | ICD-10-CM | POA: Diagnosis not present

## 2023-06-01 ENCOUNTER — Encounter (HOSPITAL_COMMUNITY): Payer: Self-pay | Admitting: Psychiatry

## 2023-06-01 ENCOUNTER — Telehealth (INDEPENDENT_AMBULATORY_CARE_PROVIDER_SITE_OTHER): Admitting: Psychiatry

## 2023-06-01 DIAGNOSIS — F3162 Bipolar disorder, current episode mixed, moderate: Secondary | ICD-10-CM | POA: Diagnosis not present

## 2023-06-01 MED ORDER — DIAZEPAM 2 MG PO TABS: 2.0000 mg | ORAL_TABLET | Freq: Every day | ORAL | 2 refills | Status: DC | PRN

## 2023-06-01 MED ORDER — PROPRANOLOL HCL ER 80 MG PO CP24: 80.0000 mg | ORAL_CAPSULE | Freq: Every day | ORAL | 2 refills | Status: DC

## 2023-06-01 MED ORDER — BUPROPION HCL ER (XL) 150 MG PO TB24: 150.0000 mg | ORAL_TABLET | ORAL | 2 refills | Status: DC

## 2023-06-01 MED ORDER — DIAZEPAM 10 MG PO TABS: 10.0000 mg | ORAL_TABLET | Freq: Every day | ORAL | 2 refills | Status: DC

## 2023-06-01 NOTE — Progress Notes (Signed)
 Virtual Visit via Video Note  I connected with Andrea Dickson on 06/01/23 at  1:00 PM EDT by a video enabled telemedicine application and verified that I am speaking with the correct person using two identifiers.  Location: Patient: home Provider: office   I discussed the limitations of evaluation and management by telemedicine and the availability of in person appointments. The patient expressed understanding and agreed to proceed.      I discussed the assessment and treatment plan with the patient. The patient was provided an opportunity to ask questions and all were answered. The patient agreed with the plan and demonstrated an understanding of the instructions.   The patient was advised to call back or seek an in-person evaluation if the symptoms worsen or if the condition fails to improve as anticipated.  I provided 20 minutes of non-face-to-face time during this encounter.   Alfredia Annas, MD  [facility] MD/PA/NP OP Progress Note  06/01/2023 1:22 PM Andrea Dickson  MRN:  [id number]  Chief Complaint:  Chief Complaint  Patient presents with   Depression   Anxiety   Follow-up   HPI:  This patient is a 73 year old separated white female who lives alone in [place]. She has 1 son and 3 grandchildren. She is retired from [employer]. She has a history of depression anxiety and possible bipolar disorder   The patient returns for follow-up after 4 weeks.  Last time she was having a lot of side effects from Wellbutrin  XL 300 mg.  She was having shakiness and gait instability.  Because of this I have cut the dose back down to 150 mg daily and she seems to be doing a lot better.  She states she is no longer shaking and is able to walk without assistance.  She does have some shaking in her hands which is chronic.  She states that her mood is good and she is not depressed or significantly anxious.  She is sleeping well and denies thoughts of self-harm Visit Diagnosis:    ICD-10-CM   1.  Bipolar 1 disorder, mixed, moderate (HCC)  F31.62       Past Psychiatric History: Past outpatient treatment  Past Medical History:  Past Medical History:  Diagnosis Date   Anxiety    Asthma    Bipolar 1 disorder (HCC)    Chronic diarrhea    Cirrhosis (HCC)    Diagnosed September 2020, likely secondary to [facility], s/p Hep A/B vaccination in 2021.    Depression    Diabetes (HCC)    Essential tremor    GERD (gastroesophageal reflux disease)    Headache    High cholesterol     Past Surgical History:  Procedure Laterality Date   ABDOMINAL HYSTERECTOMY  1997   BIOPSY  01/17/2018   Procedure: BIOPSY;  Surgeon: Suzette Espy, MD;  [facility];  Service: Endoscopy;;  colon   carpal tunnel Bilateral 1999, 06/2009   COLONOSCOPY  2013   Dr. Alline Ivans: no colon polyps. quality of prep was fair. repeat colonoscopy in five years.    COLONOSCOPY WITH PROPOFOL  N/A 01/17/2018   Dr. Riley Cheadle: Colonic lipoma, 2 tubular adenomas removed.  Random colon biopsies negative.  Next colonoscopy 5 years.   COLONOSCOPY WITH PROPOFOL  N/A 09/26/2022   Procedure: COLONOSCOPY WITH PROPOFOL ;  Surgeon: Urban Garden, MD;  [facility];  Service: Gastroenterology;  Laterality: N/A;   ESOPHAGOGASTRODUODENOSCOPY N/A 04/19/2023   Procedure: EGD (ESOPHAGOGASTRODUODENOSCOPY);  Surgeon: Vinetta Greening, DO;  [facility];  Service: Endoscopy;  Laterality: N/A;  1:45 pm, asa 3   ESOPHAGOGASTRODUODENOSCOPY (EGD) WITH PROPOFOL  N/A 04/06/2019   Procedure: ESOPHAGOGASTRODUODENOSCOPY (EGD) WITH PROPOFOL ;  Surgeon: Suzette Espy, MD;   Normal esophagus, mild portal hypertensive gastropathy, otherwise normal exam.  Recommend repeat EGD in 2 years for variceal screening.    ESOPHAGOGASTRODUODENOSCOPY (EGD) WITH PROPOFOL  N/A 01/19/2022   Procedure: ESOPHAGOGASTRODUODENOSCOPY (EGD) WITH PROPOFOL ;  Surgeon: Suzette Espy, MD;  [facility];  Service: Endoscopy;  Laterality: N/A;  12:45  pm, pt knows to arrive at 9:15   ESOPHAGOGASTRODUODENOSCOPY (EGD) WITH PROPOFOL  N/A 09/25/2022   Procedure: ESOPHAGOGASTRODUODENOSCOPY (EGD) WITH PROPOFOL ;  Surgeon: Urban Garden, MD;  [facility];  Service: Gastroenterology;  Laterality: N/A;   IR RADIOLOGIST EVAL & MGMT  04/28/2023   POLYPECTOMY  01/17/2018   Procedure: POLYPECTOMY;  Surgeon: Suzette Espy, MD;  [facility];  Service: Endoscopy;;  colon   POLYPECTOMY  09/26/2022   Procedure: POLYPECTOMY;  Surgeon: Urban Garden, MD;  [facility];  Service: Gastroenterology;;   SKIN CANCER EXCISION  2007   TONSILLECTOMY  1965   ulner nerve  Left 06/2009   decompression    Family Psychiatric History: See below  Family History:  Family History  Problem Relation Age of Onset   Diabetes Mother    Brain cancer Mother    Diabetes Father    Heart attack Father    Heart disease Father    Diabetes Sister    Bipolar disorder Brother    Diabetes Brother    Heart disease Brother    Colon cancer Neg Hx    Breast cancer Neg Hx     Social History:  Social History   Socioeconomic History   Marital status: Divorced    Spouse name: Not on file   Number of children: 1   Years of education: 12   Highest education level: Not on file  Occupational History   Occupation: retired    Comment: employed at [employer]  Tobacco Use   Smoking status: Every Day    Current packs/day: 0.50    Average packs/day: 0.5 packs/day for 43.0 years (21.5 ttl pk-yrs)    Types: Cigarettes   Smokeless tobacco: Never   Tobacco comments:    smoking since 73yrs old.    Vaping Use   Vaping status: Never Used  Substance and Sexual Activity   Alcohol use: No    Alcohol/week: 0.0 standard drinks of alcohol   Drug use: No   Sexual activity: Not Currently  Other Topics Concern   Not on file  Social History Narrative   Lives alone.  Retired.  Education 12th grade.  One child.     Caffeine use-  sodas, 2 daily   Social Drivers of Health   Financial Resource Strain: Low Risk  (11/10/2021)   Received from [facility], [facility]   Overall Financial Resource Strain (CARDIA)    Difficulty of Paying Living Expenses: Not hard at all  Food Insecurity: No Food Insecurity (09/25/2022)   Hunger Vital Sign    Worried About Running Out of Food in the Last Year: Never true    Ran Out of Food in the Last Year: Never true  Transportation Needs: No Transportation Needs (09/25/2022)   PRAPARE - Administrator, Civil Service (Medical): No    Lack of Transportation (Non-Medical): No  Physical Activity: Inactive (11/10/2021)  [facility], [facility]   Exercise Vital Sign    Days of Exercise per Week: 0 days    Minutes of Exercise per Session: 0 min  Stress: No Stress Concern Present (11/10/2021)   Received from [facility], [facility] of Occupational Health - Occupational Stress Questionnaire    Feeling of Stress : Not at all  Social Connections: Socially Isolated (11/10/2021)   Received from [facility], [facility]   Social Connection and Isolation Panel [NHANES]    Frequency of Communication with Friends and Family: More than three times a week    Frequency of Social Gatherings with Friends and Family: Once a week    Attends Religious Services: Never    Database administrator or Organizations: No    Attends Banker Meetings: Never    Marital Status: Divorced    Allergies:  Allergies  Allergen Reactions   Pramipexole Other (See Comments)    Shaking, palpitations, headache, faint feeling   Crestor [Rosuvastatin Calcium ]     Muscle pain   Bee Pollen Other (See Comments)    Eyes and nose run   Pollen Extract Other (See Comments)    Eyes and nose run    Metabolic Disorder Labs: Lab Results  Component Value Date   HGBA1C 5.4 04/24/2015   MPG 108 04/24/2015   No results found for:  "PROLACTIN" Lab Results  Component Value Date   CHOL 178 02/17/2016   TRIG 229 (H) 02/17/2016   HDL 30 (L) 02/17/2016   CHOLHDL 5.9 (H) 02/17/2016   VLDL 46 (H) 02/17/2016   LDLCALC 102 (H) 02/17/2016   LDLCALC 126 (H) 11/18/2015   Lab Results  Component Value Date   TSH 1.00 12/24/2021   TSH 2.96 08/29/2019    Therapeutic Level Labs: Lab Results  Component Value Date   LITHIUM  0.8 08/29/2019   LITHIUM  0.4 (L) 10/13/2018   Lab Results  Component Value Date   VALPROATE 62.2 08/20/2016   VALPROATE 83.0 10/14/2015   No results found for: "CBMZ"  Current Medications: Current Outpatient Medications  Medication Sig Dispense Refill   acetaminophen  (TYLENOL ) 500 MG tablet Take 1,000 mg by mouth 2 (two) times daily as needed for moderate pain or headache.     amoxicillin  (AMOXIL ) 500 MG capsule Take 1,000 mg by mouth 3 (three) times daily.     buPROPion  (WELLBUTRIN  XL) 150 MG 24 hr tablet Take 1 tablet (150 mg total) by mouth every morning. 90 tablet 2   cetirizine (ZYRTEC) 10 MG tablet Take 10 mg by mouth daily. Aller-Tec     Cholecalciferol (VITAMIN D3) 50 MCG (2000 UT) TABS Take 2,000 Units by mouth daily.      diazepam  (VALIUM ) 10 MG tablet Take 1-2 tablets (10-20 mg total) by mouth at bedtime. 180 tablet 2   diazepam  (VALIUM ) 2 MG tablet Take 1 tablet (2 mg total) by mouth daily as needed for anxiety. 90 tablet 2   estrogens , conjugated, (PREMARIN ) 1.25 MG tablet Take 1.25 mg by mouth daily.      ezetimibe (ZETIA) 10 MG tablet Take 10 mg by mouth daily.     fluticasone (FLONASE) 50 MCG/ACT nasal spray Place 2 sprays into both nostrils daily.     furosemide  (LASIX ) 40 MG tablet Take 1 tablet (40 mg total) by mouth daily. 30 tablet 11   hyoscyamine  (LEVSIN SL) 0.125 MG SL tablet Place 1 tablet (0.125 mg total)  under the tongue every 6 (six) hours as needed for cramping. 30 tablet 1   Lactobacillus-Inulin (CULTURELLE DIGESTIVE HEALTH) CAPS Take 1 capsule by mouth daily.      nicotine  (NICODERM CQ  - DOSED IN MG/24 HOURS) 14 mg/24hr patch Place 1 patch (14 mg total) onto the skin daily. 28 patch 0   omeprazole  (PRILOSEC) 40 MG capsule TAKE 1 CAPSULE BY MOUTH AT BREAKFAST AND AT BEDTIME *REFILL REQUEST* 60 capsule 10   ondansetron  (ZOFRAN ) 4 MG tablet TAKE 1 TABLET BY MOUTH EVERY 8 HOURS AS NEEDED FOR NAUSEA & VOMITING 30 tablet 10   Pancrelipase , Lip-Prot-Amyl, (ZENPEP ) 60000-189600 units CPEP Take 1 capsule by mouth 3 (three) times daily with meals. 100 capsule 11   propranolol  ER (INDERAL  LA) 80 MG 24 hr capsule Take 1 capsule (80 mg total) by mouth daily. 90 capsule 2   spironolactone  (ALDACTONE ) 100 MG tablet Take 1 tablet (100 mg total) by mouth daily. 30 tablet 11   trimethoprim  (TRIMPEX ) 100 MG tablet Take 1 tablet (100 mg total) by mouth at bedtime. 30 tablet 11   No current facility-administered medications for this visit.     Musculoskeletal: Strength & Muscle Tone: na Gait & Station: na Patient leans: N/A  Psychiatric Specialty Exam: Review of Systems  All other systems reviewed and are negative.   There were no vitals taken for this visit.There is no height or weight on file to calculate BMI.  General Appearance: na  Eye Contact:  NA  Speech:  Clear and Coherent  Volume:  Normal  Mood:  Euthymic  Affect:  Congruent  Thought Process:  Goal Directed  Orientation:  Full (Time, Place, and Person)  Thought Content: WDL   Suicidal Thoughts:  No  Homicidal Thoughts:  No  Memory:  Immediate;   Good Recent;   Good Remote;   Fair  Judgement:  Good  Insight:  Good  Psychomotor Activity:  Tremor  Concentration:  Concentration: Good and Attention Span: Good  Recall:  Good  Fund of Knowledge: Good  Language: Good  Akathisia:  No  Handed:  Right  AIMS (if indicated): not done  Assets:  Communication Skills Desire for Improvement Physical Health Resilience Social Support Talents/Skills  ADL's:  Intact  Cognition: WNL  Sleep:  Good    Screenings: PHQ2-9    Flowsheet Row Video Visit from 09/30/2021 in [facility] [place] Video Visit from 06/26/2021 in [facility] [place] Video Visit from 03/26/2021 in [facility] [place] Video Visit from 12/18/2020 in [facility] [place] Video Visit from 09/24/2020 in [facility] Total Score 0 0 0 0 0      Flowsheet Row Admission (Discharged) from 04/19/2023 in [place] [facility] ED to Hosp-Admission (Discharged) from 09/24/2022 in [place] [facility] ED from 07/25/2022 in [facility]  C-SSRS RISK CATEGORY No Risk No Risk No Risk        Assessment and Plan: This patient is a 73 year old female with a history of bipolar disorder anxiety tremor nonalcoholic liver disease with associated ascites and varices.  She is doing much better on the lower dose of Wellbutrin  so we will continue at 150 mg daily for depression.  She will continue Valium  2 mg once daily as needed for anxiety and Valium  10 to 20 mg at bedtime for sleep and anxiety as well  as Inderal  LA 80 mg every morning for tremor.  She will return to see me in 3 months  Collaboration of Care: Collaboration of Care: Other provider involved in patient's care AEB notes are shared with GI on the epic system  Patient/Guardian was advised Release of Information must be obtained prior to any record release in order to collaborate their care with an outside provider. Patient/Guardian was advised if they have not already done so to contact the registration department to sign all necessary forms in order for us  to release information regarding their care.   Consent: Patient/Guardian gives verbal consent for treatment and assignment of benefits for services provided during this visit. Patient/Guardian expressed  understanding and agreed to proceed.    Alfredia Annas, MD 06/01/2023, 1:22 PM

## 2023-06-05 ENCOUNTER — Other Ambulatory Visit: Payer: Self-pay | Admitting: Urology

## 2023-06-05 DIAGNOSIS — N390 Urinary tract infection, site not specified: Secondary | ICD-10-CM

## 2023-06-08 NOTE — Progress Notes (Unsigned)
 GI Office Note    Referring Provider: Alston Jerry, MD Primary Care Physician:  Alston Jerry, MD Primary Gastroenterologist: Windsor Hatcher.Rourk, MD  Date:  06/09/2023  ID:  Andrea Dickson, DOB 08-13-1950, MRN [id number]   Chief Complaint   Chief Complaint  Patient presents with   Follow-up    Follow up on cirrhosis   History of Present Illness  Andrea Dickson is a 73 y.o. female with a history of cirrhosis, chronic diarrhea nausea, HLD, essential tremor, bipolar, and diabetes presenting today for follow up.   EGD 01/19/2022: -3 columns of grade 2/limited grade 3 varices -Portal hypertensive gastropathy -Friable gastric mucosa -Continue Inderal  daily -No repeat EGD needed as long as no evidence of upper GI bleed   Colonoscopy Jan 2020: colonic lipoma, tubular adenomas, random colonic biopsies negative.    Underwent paracentesis in June 2024.   CTA/BRTO protocol 08/17/2022: -Cirrhosis -Evidence of portal hypertension with ascites and splenomegaly -Small hiatal hernia -Decompressed gallbladder -No evidence of portal vein thrombosis   OV 09/17/2022.  Patient reported history of chronic diarrhea with evaluation that included a trial of Xifaxan  in January 2024 of IBS-D without any significant improvement.  Continues to have intermittent soft and loose stools.  Noted to have prior negative workup of negative colon biopsies, celiac screen negative, and trialed off metformin with no improvement.  Fecal elastase normal.  Dicyclomine  also not helpful in the past.  Was given trial of Zenpep  samples with some improvement.  Still sometimes has diarrhea no matter what she eats.  Denied any abdominal distention, confusion, mental status changes, jaundice, or pruritus.  Given increasing creatinine she is currently doing diuretics every other day initially and then went back to every day given increase of fluid in her extremities.   10/7 -patient reported feeling like she needed  paracentesis, and I recommended that she could obtain a para at [facility] to remove no more than 4 L and give 25 g of albumin .  Appears patient was referred to nephrology in the latter part of September.   EGD 09/25/2022: -Grade 3 esophageal varices -Portal hypertensive gastropathy -Normal duodenum -Advised platelets -Start propranolol  80 mg daily after colonoscopy   Colonoscopy 09/26/2022: -Hemorrhoids and perianal exam, internal hemorrhoids -Two 2-4 mm polyps in the descending and ascending colon -2-3-4 mm polyps in the descending colon -path: tubular adenomas -Repeat in 5 years.    OV 10/26/22. Stools start normal then turn loose. Wearing depends just in case. Has urgency. Zenpep  worked briefly for a few days and then reported a return of symptoms.  Reportedly having relief with Levsin.  She is taking her diuretics as prescribed as she states she cannot breathe if she does not take them.  Had reported some very little BRBPR.  Seeing nephrology.  Taking Zofran  in the morning to help with nausea as well as continuing her PPI twice daily.  She has stopped Imodium at recommendation of PCP.  They also removed her Trimpex  and ibuprofen.  Recalled for ultrasound in February.  Advised to continue Lasix  20 and Aldactone  50 mg daily.  Continue propranolol  as well as omeprazole .  Increased Zenpep  to 60,000 units with meals.  Advised another trial of Xifaxan  if no improvement with Zenpep .  Complete MELD labs with AFP in 3 months.  Discussed 2 g sodium diet.  Last office visit 3/37/25. Prior to this OV her diuretics were increased (lasix  40mg , aldactone  100mg ) given weight gain and advised recheck of labs.  This visit she  presented with significant fatigue and having issues with mobility at home and some frequent falls.  Reports more fluid in her belly feeling very distended in the mornings although does have some mild improvement after taking diuretics.  Reported she is seeing some black stool but not any  blood in her stool.  Reported bruising/petechiae on her skin on her arms and legs.  Reportedly eating mostly chips and dips as well as tomato soup, not eating much meat.  He recently quit smoking.  Continues to take Zenpep  60,000 units with meals and only having 3-4 BMs daily.  Labs ordered as well as right upper quadrant ultrasound and EGD ASAP to assess for bleeding varices.  Advised continue Zenpep , omeprazole , and diuretics at current dose.  Reinforced need for 2 g sodium diet.  RUQ US  04/12/23: - cirrhosis -no focal liver lesion  EGD 04/19/23: - Grade II esophageal varices with no bleeding and no stigmata of recent bleeding.  - Portal hypertensive gastropathy.  - Normal duodenal bulb, first portion of the duodenum and second portion of the duodenum.  - No specimens collected. - Consider TIPS evaluation given ongoing issues with hypervolemia and sensitivity to diuretics.  - Patient distended today. Will order paracentesis.  - Follow up in GI office in 4- 6 weeks  - Consider capsule endoscopy for anemia.  IR eval for TIPS 4/16: Reported to be a good candidate for TIPS however she will want to discuss this with her family and see how currently ascites reaccumulate's.  Plan for 79-month follow-up and rediscuss at that time.  Today:  Cirrhosis history Hematemesis/coffee ground emesis: none Abdominal pain: with the swelling yes Abdominal distention/worsening ascites: Has been distended a lot however the diuretics help some but not a lot. At times she states she can not wear pants because she is too swollen.  Fever/chills: none Episodes of confusion/disorientation: none Number of daily bowel movements: last couple days - no BM but previously was having 2-3 daily.  Taking diuretics?: yes Date of last EGD: 04/19/23 - grade 2 varices - no bleeding, no banding Prior episodes of SBP: no Last time liver imaging was performed:04/12/23   MELD 3.0: 13 at 04/09/2023 11:35 AM MELD-Na: 10 at 04/09/2023  11:35 AM Calculated from: Serum Creatinine: 1.32 mg/dL at 1/61/0960 45:40 AM Serum Sodium: 136 mmol/L at 04/09/2023 11:35 AM Total Bilirubin: 1.1 mg/dL at 9/81/1914 78:29 AM Serum Albumin : 3.5 g/dL at 5/62/1308 65:78 AM INR(ratio): 1.1 at 04/09/2023 11:35 AM Age at listing (hypothetical): 22 years Sex: Female at 04/09/2023 11:35 AM  When she eats she feels bloated and states there isnt much that she can eat. Stays fatigued a lot and does not want to do much at home but sit in her chair - she gets up and tries to do things and then her back starts to hurt .  Only taking Zenpep  once daily - does no eat lunch. Snacks often - eats chips, dips, pickles and tomato soup.   Wt Readings from Last 3 Encounters:  06/09/23 168 lb (76.2 kg)  04/19/23 153 lb 14.1 oz (69.8 kg)  04/08/23 154 lb (69.9 kg)    Current Outpatient Medications  Medication Sig Dispense Refill   acetaminophen  (TYLENOL ) 500 MG tablet Take 1,000 mg by mouth 2 (two) times daily as needed for moderate pain or headache.     buPROPion  (WELLBUTRIN  XL) 150 MG 24 hr tablet Take 1 tablet (150 mg total) by mouth every morning. 90 tablet 2   cetirizine (ZYRTEC) 10 MG  tablet Take 10 mg by mouth daily. Aller-Tec     Cholecalciferol (VITAMIN D3) 50 MCG (2000 UT) TABS Take 2,000 Units by mouth daily.      diazepam  (VALIUM ) 10 MG tablet Take 1-2 tablets (10-20 mg total) by mouth at bedtime. 180 tablet 2   diazepam  (VALIUM ) 2 MG tablet Take 1 tablet (2 mg total) by mouth daily as needed for anxiety. 90 tablet 2   estrogens , conjugated, (PREMARIN ) 1.25 MG tablet Take 1.25 mg by mouth daily.      ezetimibe (ZETIA) 10 MG tablet Take 10 mg by mouth daily.     fluticasone (FLONASE) 50 MCG/ACT nasal spray Place 2 sprays into both nostrils daily.     furosemide  (LASIX ) 40 MG tablet Take 1 tablet (40 mg total) by mouth daily. 30 tablet 11   hyoscyamine  (LEVSIN SL) 0.125 MG SL tablet Place 1 tablet (0.125 mg total) under the tongue every 6 (six) hours as  needed for cramping. 30 tablet 1   Lactobacillus-Inulin (CULTURELLE DIGESTIVE HEALTH) CAPS Take 1 capsule by mouth daily.     omeprazole  (PRILOSEC) 40 MG capsule TAKE 1 CAPSULE BY MOUTH AT BREAKFAST AND AT BEDTIME *REFILL REQUEST* 60 capsule 10   ondansetron  (ZOFRAN ) 4 MG tablet TAKE 1 TABLET BY MOUTH EVERY 8 HOURS AS NEEDED FOR NAUSEA & VOMITING 30 tablet 10   Pancrelipase , Lip-Prot-Amyl, (ZENPEP ) 60000-189600 units CPEP Take 1 capsule by mouth 3 (three) times daily with meals. 100 capsule 11   propranolol  ER (INDERAL  LA) 80 MG 24 hr capsule Take 1 capsule (80 mg total) by mouth daily. 90 capsule 2   spironolactone  (ALDACTONE ) 100 MG tablet Take 1 tablet (100 mg total) by mouth daily. 30 tablet 11   trimethoprim  (TRIMPEX ) 100 MG tablet Take 1 tablet (100 mg total) by mouth at bedtime. 30 tablet 11   amoxicillin  (AMOXIL ) 500 MG capsule Take 1,000 mg by mouth 3 (three) times daily. (Patient not taking: Reported on 06/09/2023)     nicotine  (NICODERM CQ  - DOSED IN MG/24 HOURS) 14 mg/24hr patch Place 1 patch (14 mg total) onto the skin daily. (Patient not taking: Reported on 06/09/2023) 28 patch 0   No current facility-administered medications for this visit.    Past Medical History:  Diagnosis Date   Anxiety    Asthma    Bipolar 1 disorder (HCC)    Chronic diarrhea    Cirrhosis (HCC)    Diagnosed September 2020, likely secondary to [facility], s/p Hep A/B vaccination in 2021.    Depression    Diabetes (HCC)    Essential tremor    GERD (gastroesophageal reflux disease)    Headache    High cholesterol     Past Surgical History:  Procedure Laterality Date   ABDOMINAL HYSTERECTOMY  1997   BIOPSY  01/17/2018   Procedure: BIOPSY;  Surgeon: Suzette Espy, MD;  [facility];  Service: Endoscopy;;  colon   carpal tunnel Bilateral 1999, 06/2009   COLONOSCOPY  2013   Dr. Alline Ivans: no colon polyps. quality of prep was fair. repeat colonoscopy in five years.    COLONOSCOPY WITH PROPOFOL  N/A  01/17/2018   Dr. Riley Cheadle: Colonic lipoma, 2 tubular adenomas removed.  Random colon biopsies negative.  Next colonoscopy 5 years.   COLONOSCOPY WITH PROPOFOL  N/A 09/26/2022   Procedure: COLONOSCOPY WITH PROPOFOL ;  Surgeon: Urban Garden, MD;  [facility];  Service: Gastroenterology;  Laterality: N/A;   ESOPHAGOGASTRODUODENOSCOPY N/A 04/19/2023   Procedure: EGD (ESOPHAGOGASTRODUODENOSCOPY);  Surgeon: Vinetta Greening, DO;  [facility];  Service: Endoscopy;  Laterality: N/A;  1:45 pm, asa 3   ESOPHAGOGASTRODUODENOSCOPY (EGD) WITH PROPOFOL  N/A 04/06/2019   Procedure: ESOPHAGOGASTRODUODENOSCOPY (EGD) WITH PROPOFOL ;  Surgeon: Suzette Espy, MD;   Normal esophagus, mild portal hypertensive gastropathy, otherwise normal exam.  Recommend repeat EGD in 2 years for variceal screening.    ESOPHAGOGASTRODUODENOSCOPY (EGD) WITH PROPOFOL  N/A 01/19/2022   Procedure: ESOPHAGOGASTRODUODENOSCOPY (EGD) WITH PROPOFOL ;  Surgeon: Suzette Espy, MD;  [facility];  Service: Endoscopy;  Laterality: N/A;  12:45 pm, pt knows to arrive at 9:15   ESOPHAGOGASTRODUODENOSCOPY (EGD) WITH PROPOFOL  N/A 09/25/2022   Procedure: ESOPHAGOGASTRODUODENOSCOPY (EGD) WITH PROPOFOL ;  Surgeon: Urban Garden, MD;  [facility];  Service: Gastroenterology;  Laterality: N/A;   IR RADIOLOGIST EVAL & MGMT  04/28/2023   POLYPECTOMY  01/17/2018   Procedure: POLYPECTOMY;  Surgeon: Suzette Espy, MD;  [facility];  Service: Endoscopy;;  colon   POLYPECTOMY  09/26/2022   Procedure: POLYPECTOMY;  Surgeon: Urban Garden, MD;  [facility];  Service: Gastroenterology;;   SKIN CANCER EXCISION  2007   TONSILLECTOMY  1965   ulner nerve  Left 06/2009   decompression    Family History  Problem Relation Age of Onset   Diabetes Mother    Brain cancer Mother    Diabetes Father    Heart attack Father    Heart disease Father    Diabetes Sister    Bipolar  disorder Brother    Diabetes Brother    Heart disease Brother    Colon cancer Neg Hx    Breast cancer Neg Hx     Allergies as of 06/09/2023 - Review Complete 06/09/2023  Allergen Reaction Noted   Pramipexole Other (See Comments) 04/24/2015   Crestor [rosuvastatin calcium ]  03/28/2019   Bee pollen Other (See Comments) 04/24/2015   Pollen extract Other (See Comments) 04/24/2015    Social History   Socioeconomic History   Marital status: Divorced    Spouse name: Not on file   Number of children: 1   Years of education: 12   Highest education level: Not on file  Occupational History   Occupation: retired    Comment: employed at [employer]  Tobacco Use   Smoking status: Every Day    Current packs/day: 0.50    Average packs/day: 0.5 packs/day for 43.0 years (21.5 ttl pk-yrs)    Types: Cigarettes   Smokeless tobacco: Never   Tobacco comments:    smoking since 73yrs old.    Vaping Use   Vaping status: Never Used  Substance and Sexual Activity   Alcohol use: No    Alcohol/week: 0.0 standard drinks of alcohol   Drug use: No   Sexual activity: Not Currently  Other Topics Concern   Not on file  Social History Narrative   Lives alone.  Retired.  Education 12th grade.  One child.     Caffeine use- sodas, 2 daily   Social Drivers of Health   Financial Resource Strain: Low Risk  (11/10/2021)   Received from [facility], [facility]   Overall Financial Resource Strain (CARDIA)    Difficulty of Paying Living Expenses: Not hard at all  Food Insecurity: No Food Insecurity (09/25/2022)   Hunger Vital Sign    Worried About Running Out of Food in the Last Year: Never true    Ran Out of Food in the Last  Year: Never true  Transportation Needs: No Transportation Needs (09/25/2022)   PRAPARE - Administrator, Civil Service (Medical): No    Lack of Transportation (Non-Medical): No  Physical Activity: Inactive (11/10/2021)   Received from [facility], [facility]   Exercise Vital Sign    Days of Exercise per Week: 0 days    Minutes of Exercise per Session: 0 min  Stress: No Stress Concern Present (11/10/2021)   Received from [facility], [facility] of Occupational Health - Occupational Stress Questionnaire    Feeling of Stress : Not at all  Social Connections: Socially Isolated (11/10/2021)   Received from [facility], [facility]   Social Connection and Isolation Panel [NHANES]    Frequency of Communication with Friends and Family: More than three times a week    Frequency of Social Gatherings with Friends and Family: Once a week    Attends Religious Services: Never    Database administrator or Organizations: No    Attends Engineer, structural: Never    Marital Status: Divorced     Review of Systems   Gen: Denies fever, chills, anorexia. Denies fatigue, weakness, weight loss.  CV: Denies chest pain, palpitations, syncope, peripheral edema, and claudication. Resp: Denies dyspnea at rest, cough, wheezing, coughing up blood, and pleurisy. GI: See HPI Derm: Denies rash, itching, dry skin Psych: Denies depression, anxiety, memory loss, confusion. No homicidal or suicidal ideation.  Heme: Denies bruising, bleeding, and enlarged lymph nodes.  Physical Exam   BP 127/70   Pulse 73   Temp 98.6 F (37 C)   Ht 5\' 6"  (1.676 m)   Wt 168 lb (76.2 kg)   BMI 27.12 kg/m   General:   Alert and oriented. No distress noted. Pleasant and cooperative.  Head:  Normocephalic and atraumatic. Eyes:  Conjuctiva clear without scleral icterus. Abdomen:  +BS, soft. Distended and fluid filled but soft and not taut  No rebound or guarding. UTA for HSM on exam - noted on imaging.  Rectal: deferred Msk:  Symmetrical without gross deformities. Normal posture. Neurologic:  Alert and  oriented x4. No asterixis.  Psych:  Alert and cooperative. Normal mood and affect.  Assessment  Andrea Dickson is a  73 y.o. female with a history of cirrhosis, chronic diarrhea nausea, HLD, essential tremor, bipolar, and diabetes presenting today for follow u p of cirrhosis post proceudre.   Decompensated cirrhosis:  - Grade 3 varices in September 2024 and grade 2 noted on most recent  EGD March - not banded - maintained on propranolol  - HR 73 - Continued ascites despite diuretics, will assess renal function and increase diuretics as able.  - For now will provide standing paracentesis every 2 weeks with labs and albumin  - Does have nausea and early satiety and decreased appetite secondary to fullness sensation given abdominal distention.  - Discussed performing TIPs - Continue with fatigue and also has anemia - MELD: 13 sine March - No evidence of HE and up to date on hepatoma screening.   IBS diarrhea, EPI?:  - Does not eat 3 meals a day, has only been taking Zenpep  60,000 units once daily on average - Usually has about 2-3 bowel movements daily, has not had a bowel movement in the last 2 days. -She does have intermittent abdominal pain, unclear if this is secondary to ascites accumulation versus frequent diarrhea.  Anemia:  - Previously reported  some black stools and denied any previous Pepto or iron supplementation - Evidence of grade 2 nonbleeding esophageal varices on recent EGD along with portal gastropathy. - Intermittent dark stools could be secondary to diet versus portal gastropathy. - Maintain on PPI twice daily - Colonoscopy up-to-date - Given some mild anemia, will proceed with capsule endoscopy to complete workup of anemia.  PLAN   Givens capsule CBC, CMP, INR, iron panel. 2g sodium diet Can take Tylenol  max of 2 g per day (650 mg q8h) for pain Avoid NSAIDs for pain Avoid eating raw oysters/shellfish Protein shake (Ensure or Boost) every night before going to sleep - lactose free Lasix  40 mg and aldactone  100 mg daily. Refilled to mail delivery.  Zenpep  60K units with  meals Omeprazole  40 mg BID Continue propranolol  80 mg daily.  Standing paracentesis orders with labs and albumin  per protocol.  Follow up with IR for consideration of TIPS Follow-up in 3 months  Julian Obey, MSN, FNP-BC, AGACNP-BC [facility]

## 2023-06-09 ENCOUNTER — Other Ambulatory Visit (HOSPITAL_COMMUNITY)
Admission: RE | Admit: 2023-06-09 | Discharge: 2023-06-09 | Disposition: A | Discharge status: Home / Self Care | Source: Ambulatory Visit | Attending: Gastroenterology | Admitting: Gastroenterology

## 2023-06-09 ENCOUNTER — Ambulatory Visit (INDEPENDENT_AMBULATORY_CARE_PROVIDER_SITE_OTHER): Admitting: Gastroenterology

## 2023-06-09 ENCOUNTER — Encounter: Payer: Self-pay | Admitting: *Deleted

## 2023-06-09 ENCOUNTER — Ambulatory Visit: Payer: Self-pay | Admitting: Gastroenterology

## 2023-06-09 ENCOUNTER — Encounter: Payer: Self-pay | Admitting: Gastroenterology

## 2023-06-09 VITALS — BP 127/70 | HR 73 | Temp 98.6°F | Ht 66.0 in | Wt 168.0 lb

## 2023-06-09 DIAGNOSIS — I8500 Esophageal varices without bleeding: Secondary | ICD-10-CM

## 2023-06-09 DIAGNOSIS — K3189 Other diseases of stomach and duodenum: Secondary | ICD-10-CM

## 2023-06-09 DIAGNOSIS — R188 Other ascites: Secondary | ICD-10-CM | POA: Diagnosis not present

## 2023-06-09 DIAGNOSIS — D649 Anemia, unspecified: Secondary | ICD-10-CM | POA: Diagnosis not present

## 2023-06-09 DIAGNOSIS — K580 Irritable bowel syndrome with diarrhea: Secondary | ICD-10-CM

## 2023-06-09 DIAGNOSIS — K7460 Unspecified cirrhosis of liver: Secondary | ICD-10-CM | POA: Insufficient documentation

## 2023-06-09 DIAGNOSIS — K219 Gastro-esophageal reflux disease without esophagitis: Secondary | ICD-10-CM

## 2023-06-09 LAB — IRON AND TIBC
Iron: 100 ug/dL (ref 28–170)
Saturation Ratios: 23 % (ref 10.4–31.8)
TIBC: 434 ug/dL (ref 250–450)
UIBC: 334 ug/dL

## 2023-06-09 LAB — CBC
HCT: 35.7 % — ABNORMAL LOW (ref 36.0–46.0)
Hemoglobin: 11.3 g/dL — ABNORMAL LOW (ref 12.0–15.0)
MCH: 26.9 pg (ref 26.0–34.0)
MCHC: 31.7 g/dL (ref 30.0–36.0)
MCV: 85 fL (ref 80.0–100.0)
Platelets: 76 10*3/uL — ABNORMAL LOW (ref 150–400)
RBC: 4.2 MIL/uL (ref 3.87–5.11)
RDW: 15.2 % (ref 11.5–15.5)
WBC: 4.3 10*3/uL (ref 4.0–10.5)
nRBC: 0 % (ref 0.0–0.2)

## 2023-06-09 LAB — COMPREHENSIVE METABOLIC PANEL WITH GFR
ALT: 12 U/L (ref 0–44)
AST: 19 U/L (ref 15–41)
Albumin: 3.5 g/dL (ref 3.5–5.0)
Alkaline Phosphatase: 106 U/L (ref 38–126)
Anion gap: 11 (ref 5–15)
BUN: 14 mg/dL (ref 8–23)
CO2: 24 mmol/L (ref 22–32)
Calcium: 9 mg/dL (ref 8.9–10.3)
Chloride: 103 mmol/L (ref 98–111)
Creatinine, Ser: 1.2 mg/dL — ABNORMAL HIGH (ref 0.44–1.00)
GFR, Estimated: 48 mL/min — ABNORMAL LOW (ref >60)
Glucose, Bld: 147 mg/dL — ABNORMAL HIGH (ref 70–99)
Potassium: 4 mmol/L (ref 3.5–5.1)
Sodium: 138 mmol/L (ref 135–145)
Total Bilirubin: 1.6 mg/dL — ABNORMAL HIGH (ref 0.0–1.2)
Total Protein: 7.5 g/dL (ref 6.5–8.1)

## 2023-06-09 LAB — FERRITIN: Ferritin: 10 ng/mL — ABNORMAL LOW (ref 11–307)

## 2023-06-09 LAB — PROTIME-INR
INR: 1.2 (ref 0.8–1.2)
Prothrombin Time: 15.5 s — ABNORMAL HIGH (ref 11.4–15.2)

## 2023-06-09 MED ORDER — FUROSEMIDE 40 MG PO TABS: 40.0000 mg | ORAL_TABLET | Freq: Every day | ORAL | 11 refills | Status: AC

## 2023-06-09 MED ORDER — SPIRONOLACTONE 100 MG PO TABS: 100.0000 mg | ORAL_TABLET | Freq: Every day | ORAL | 11 refills | Status: AC

## 2023-06-09 NOTE — Patient Instructions (Addendum)
 We will do a capsule endoscopy on you to further evaluate your anemia.   Please have blood work completed at [facility] or [facility].  We will call you with results once they have been received. Please allow 3-5 business days for review. 2 locations for [facility] in [place]:              1. [address], [place]              2. [address] [place]   I am get you set up for as needed paracentesis every 2 weeks at the hospital while we try to increase her diuretics if kidney function allows.  I continue to encourage you to keep her follow-up with IR for consideration of TIPS.   Continue taking Lasix  20 mg and Aldactone  100 mg daily.  Please also continue taking omeprazole  40 mg twice daily and your propranolol  80 mg once daily.    Also continue taking your Zenpep  60,000 units with meals.  Goal is to have at least 1 bowel movement per day, if you continue to have some constipation please let me know.  You want to encourage you to increase protein in your diet, I recommend at least a lactose-free shake nightly before bed.  Even though you should avoid raw oysters and shellfish you can eat regular fish.  Cirrhosis Lifestyle Recommendations:  High-protein diet from a primarily plant-based diet. Avoid red meat.  No raw or undercooked meat, seafood, or shellfish. Low-fat/cholesterol/carbohydrate diet. Limit sodium to no more than 2000 mg/day including everything that you eat and drink. Recommend at least 30 minutes of aerobic and resistance exercise 3 days/week. Limit Tylenol  to 2000 mg daily.   Will follow-up in 3 months, sooner if needed.  It was a pleasure to see you today. I want to create trusting relationships with patients. If you receive a survey regarding your visit,  I greatly appreciate you taking time to fill this out on paper or through your MyChart. I value your feedback.  Julian Obey, MSN, FNP-BC, AGACNP-BC [facility]

## 2023-06-15 ENCOUNTER — Inpatient Hospital Stay (HOSPITAL_COMMUNITY): Admission: RE | Admit: 2023-06-15 | Source: Ambulatory Visit

## 2023-06-16 ENCOUNTER — Encounter (HOSPITAL_COMMUNITY)
Admission: RE | Disposition: A | Discharge status: Home / Self Care | Payer: Self-pay | Source: Home / Self Care | Attending: Internal Medicine

## 2023-06-16 ENCOUNTER — Ambulatory Visit (HOSPITAL_COMMUNITY)
Admission: RE | Admit: 2023-06-16 | Discharge: 2023-06-16 | Disposition: A | Discharge status: Home / Self Care | Source: Ambulatory Visit | Attending: Gastroenterology | Admitting: Gastroenterology

## 2023-06-16 ENCOUNTER — Encounter (HOSPITAL_COMMUNITY): Payer: Self-pay

## 2023-06-16 ENCOUNTER — Ambulatory Visit (HOSPITAL_COMMUNITY)
Admission: RE | Admit: 2023-06-16 | Discharge: 2023-06-16 | Disposition: A | Discharge status: Home / Self Care | Attending: Internal Medicine | Admitting: Internal Medicine

## 2023-06-16 ENCOUNTER — Ambulatory Visit: Payer: Self-pay | Admitting: Gastroenterology

## 2023-06-16 DIAGNOSIS — K7460 Unspecified cirrhosis of liver: Secondary | ICD-10-CM | POA: Insufficient documentation

## 2023-06-16 DIAGNOSIS — K5989 Other specified functional intestinal disorders: Secondary | ICD-10-CM | POA: Diagnosis not present

## 2023-06-16 DIAGNOSIS — D509 Iron deficiency anemia, unspecified: Secondary | ICD-10-CM | POA: Insufficient documentation

## 2023-06-16 DIAGNOSIS — K3189 Other diseases of stomach and duodenum: Secondary | ICD-10-CM | POA: Diagnosis not present

## 2023-06-16 DIAGNOSIS — R188 Other ascites: Secondary | ICD-10-CM | POA: Diagnosis not present

## 2023-06-16 DIAGNOSIS — I8500 Esophageal varices without bleeding: Secondary | ICD-10-CM | POA: Diagnosis not present

## 2023-06-16 DIAGNOSIS — D500 Iron deficiency anemia secondary to blood loss (chronic): Secondary | ICD-10-CM | POA: Diagnosis not present

## 2023-06-16 DIAGNOSIS — K766 Portal hypertension: Secondary | ICD-10-CM | POA: Insufficient documentation

## 2023-06-16 HISTORY — PX: GIVENS CAPSULE STUDY: SHX5432

## 2023-06-16 LAB — BODY FLUID CELL COUNT WITH DIFFERENTIAL
Lymphs, Fluid: 71 %
Monocyte-Macrophage-Serous Fluid: 14 % — ABNORMAL LOW (ref 50–90)
Neutrophil Count, Fluid: 15 % (ref 0–25)
Total Nucleated Cell Count, Fluid: 297 uL (ref 0–1000)

## 2023-06-16 LAB — GRAM STAIN

## 2023-06-16 SURGERY — IMAGING PROCEDURE, GI TRACT, INTRALUMINAL, VIA CAPSULE

## 2023-06-16 MED ORDER — ALBUMIN HUMAN 25 % IV SOLN
50.0000 g | Freq: Once | INTRAVENOUS | Status: AC
  Administered 2023-06-16: 50 g via INTRAVENOUS

## 2023-06-16 MED ORDER — ALBUMIN HUMAN 25 % IV SOLN
INTRAVENOUS | Status: AC
Start: 2023-06-16 — End: ?
  Filled 2023-06-16: qty 200

## 2023-06-16 NOTE — H&P (Signed)
 Patient presenting for capsule endoscopy due to history of iron deficiency anemia.

## 2023-06-16 NOTE — Procedures (Signed)
 PROCEDURE SUMMARY:  Successful ultrasound guided paracentesis from the left lower quadrant.  Yielded 5 liters of straw colored fluid.  No immediate complications.  The patient tolerated the procedure well.   Specimen was sent for labs.  EBL < 2 mL  The patient has previously been formally evaluated by the [facility] and is being actively followed for potential future intervention. 4.16.25 with Dr Julietta Ogren. Per note   Patient would like to think about the procedure and see how quickly the ascites re-accumulates and discuss with her family.   We will plan for follow-up visit in 3 months to evaluate the situation.

## 2023-06-16 NOTE — Progress Notes (Signed)
 Patient tolerated left sided Paracentesis and 50G of IV albumin  well today and 5 Liters of clear yellow ascites removed with labs collected and sent to lab for processing. Patient verbalized understanding of discharge instructions and transported via wheelchair to ENDO department at this time for scheduled outpatient appointment with no acute distress noted.

## 2023-06-16 NOTE — Progress Notes (Signed)
 Pt arrived for givens capsule.  States she is having a paracentesis today at 1000.  Ate solid food sometime between 2-4pm yesterday.  Per Dr. Mordechai April okay to proceed with capsule.  Discussed with Ultrasound tech who stated belt would possibly interfere with cleaning/sterilization of puncture site.  After discussing with pt, pt states she will return for givens capsule today after having paracentesis.

## 2023-06-17 ENCOUNTER — Encounter (HOSPITAL_COMMUNITY): Payer: Self-pay | Admitting: Internal Medicine

## 2023-06-17 LAB — PATHOLOGIST SMEAR REVIEW

## 2023-06-21 LAB — CULTURE, BODY FLUID W GRAM STAIN -BOTTLE: Culture: NO GROWTH

## 2023-06-21 NOTE — Op Note (Cosign Needed)
 Small Bowel Givens Capsule Study Procedure date:  06/16/23  PCP:  Dr. Herman Longs, Maceo Sax, MD  Indication for procedure:  Anemia  Patient data:  Wt: 76.2 kg Ht: 5' 6  Findings:   Study complete to the cecum. Mild erythema noted in the stomach. Multiple lymphangiectasias noted in the proximal duodenum (just before 00:43:00 into study). Bile/gastric contents are present intermittently throughout the study partially obscuring views.  Linear erosion noted at 02:10:46 into the study.  Prominent veins intermittently. Some prominent veins identified within small bowel, see attached images below. No overt bleeding identified during the study.  There is evidence of portal hypertensive enteropathy (hyperemia).  Images reviewed with Dr. Riley Cheadle.  First Gastric image:  00:00:29 First Duodenal image: 00:28:05 First Ileo-Cecal Valve image: 03:30:20 First Cecal image: 03:30:50 Gastric Passage time: 0h 72m Small Bowel Passage time:  3h 58m  Summary & Recommendations: Study was complete to the cecum with portal hypertensive enteropathy and linear erosion noted.  Views were intermittently partially obscured by bowel/gastric contents therefore unclear if there are any additional abnormalities not seen in these areas.  Anemia likely secondary to intermittent slow oozing secondary to portal hypertensive gastropathy and portal hypertensive enteropathy.  There has been evidence of prior esophageal varices without bleeding.  Etiology of anemia is likely secondary to chronic disease.   Continue to recommend TIPS procedure to help with ascites and reduce bleeding risk Continuing daily propranolol  for the same Monitoring of CBC and platelet count at least every 6 months If any evidence of melena, we will plan to repeat EGD Recent iron labs stable therefore no plan for oral iron at this time.  See full report document below.  Julian Obey, MSN, APRN, FNP-BC, AGACNP-BC [facility]  Attending note:  pertinent images reviewed.  Agree with assessment.

## 2023-06-28 ENCOUNTER — Other Ambulatory Visit: Payer: Self-pay | Admitting: Urology

## 2023-06-28 DIAGNOSIS — K766 Portal hypertension: Secondary | ICD-10-CM | POA: Diagnosis not present

## 2023-06-28 DIAGNOSIS — E1169 Type 2 diabetes mellitus with other specified complication: Secondary | ICD-10-CM | POA: Diagnosis not present

## 2023-06-28 DIAGNOSIS — R0781 Pleurodynia: Secondary | ICD-10-CM | POA: Diagnosis not present

## 2023-06-28 DIAGNOSIS — R296 Repeated falls: Secondary | ICD-10-CM | POA: Diagnosis not present

## 2023-06-28 DIAGNOSIS — N390 Urinary tract infection, site not specified: Secondary | ICD-10-CM

## 2023-06-30 ENCOUNTER — Encounter (HOSPITAL_COMMUNITY): Payer: Self-pay

## 2023-06-30 ENCOUNTER — Ambulatory Visit (HOSPITAL_COMMUNITY)
Admission: RE | Admit: 2023-06-30 | Discharge: 2023-06-30 | Disposition: A | Discharge status: Home / Self Care | Source: Ambulatory Visit | Attending: Gastroenterology | Admitting: Gastroenterology

## 2023-06-30 DIAGNOSIS — R188 Other ascites: Secondary | ICD-10-CM | POA: Diagnosis not present

## 2023-06-30 DIAGNOSIS — K7469 Other cirrhosis of liver: Secondary | ICD-10-CM | POA: Diagnosis not present

## 2023-06-30 LAB — BODY FLUID CELL COUNT WITH DIFFERENTIAL
Lymphs, Fluid: 69 %
Monocyte-Macrophage-Serous Fluid: 19 % — ABNORMAL LOW (ref 50–90)
Neutrophil Count, Fluid: 12 % (ref 0–25)
Total Nucleated Cell Count, Fluid: 275 uL (ref 0–1000)

## 2023-06-30 LAB — GRAM STAIN

## 2023-06-30 MED ORDER — ALBUMIN HUMAN 25 % IV SOLN
50.0000 g | Freq: Once | INTRAVENOUS | Status: AC
  Administered 2023-06-30: 50 g via INTRAVENOUS

## 2023-06-30 MED ORDER — ALBUMIN HUMAN 25 % IV SOLN
INTRAVENOUS | Status: AC
  Filled 2023-06-30: qty 100

## 2023-06-30 NOTE — Progress Notes (Signed)
 Patient tolerated left sided Paracentesis and 50G of IV albumin  well today and 5 liters of clear yellow ascites removed. Patient verbalized understanding of discharge instructions and left via wheelchair with no acute distress noted.

## 2023-06-30 NOTE — Procedures (Addendum)
 PROCEDURE SUMMARY:  Successful image-guided paracentesis from the left lower abdomen.  Yielded 5.0 liters of clear yellow fluid.  No immediate complications.  EBL: zero Patient tolerated well.   Specimen was sent for labs.  Please see imaging section of Epic for full dictation.  Patient seen by Dr. Julietta Ogren in IR clinic 04/28/23 to discuss potential TIPS procedure, per his assessment:  At this time, the patient's liver synthetic function is very good and her MELD score is 13.  Main portal vein was patent on recent US  from 04/12/23. She would be a candidate for a TIPS procedure based on her MELD score and anatomy.  Patient would like to think about the procedure and see how quickly the ascites re-accumulates and discuss with her family.   We will plan for follow-up visit in 3 months to evaluate the situation.   Andrea Shown PA-C 06/30/2023 2:57 PM

## 2023-07-01 ENCOUNTER — Ambulatory Visit: Payer: Self-pay | Admitting: Gastroenterology

## 2023-07-02 LAB — CULTURE, BODY FLUID W GRAM STAIN -BOTTLE: Special Requests: ADEQUATE

## 2023-07-05 LAB — CULTURE, BODY FLUID W GRAM STAIN -BOTTLE: Culture: NO GROWTH

## 2023-07-13 ENCOUNTER — Encounter: Payer: Self-pay | Admitting: *Deleted

## 2023-07-14 ENCOUNTER — Ambulatory Visit (HOSPITAL_COMMUNITY)
Admission: RE | Admit: 2023-07-14 | Discharge: 2023-07-14 | Disposition: A | Discharge status: Home / Self Care | Source: Ambulatory Visit | Attending: Gastroenterology | Admitting: Gastroenterology

## 2023-07-14 ENCOUNTER — Ambulatory Visit: Payer: Self-pay | Admitting: Gastroenterology

## 2023-07-14 ENCOUNTER — Ambulatory Visit: Payer: 59 | Admitting: Urology

## 2023-07-14 DIAGNOSIS — K7460 Unspecified cirrhosis of liver: Secondary | ICD-10-CM | POA: Insufficient documentation

## 2023-07-14 DIAGNOSIS — R188 Other ascites: Secondary | ICD-10-CM | POA: Insufficient documentation

## 2023-07-14 LAB — BODY FLUID CELL COUNT WITH DIFFERENTIAL
Lymphs, Fluid: 53 %
Monocyte-Macrophage-Serous Fluid: 28 % — ABNORMAL LOW (ref 50–90)
Neutrophil Count, Fluid: 19 % (ref 0–25)
Total Nucleated Cell Count, Fluid: 366 uL (ref 0–1000)

## 2023-07-14 LAB — GRAM STAIN

## 2023-07-14 MED ORDER — LIDOCAINE HCL (PF) 2 % IJ SOLN
INTRAMUSCULAR | Status: AC
  Filled 2023-07-14: qty 10

## 2023-07-14 MED ORDER — ALBUMIN HUMAN 25 % IV SOLN
INTRAVENOUS | Status: AC
  Filled 2023-07-14: qty 200

## 2023-07-14 NOTE — Procedures (Signed)
 PROCEDURE SUMMARY:  Successful ultrasound guided paracentesis from the left lower quadrant.  Yielded 4.7 liters of pale yellow fluid.  No immediate complications.  The patient tolerated the procedure well.   Specimen was sent for labs.  EBL <  2 mL  The patient has previously been formally evaluated by the [facility] Interventional Radiology Portal [facility]pertension [facility] and is being actively followed for potential future intervention.

## 2023-07-14 NOTE — Progress Notes (Signed)
 Patient brought to US  Rm1 in no acute distress. Per WC. Paracentesis explained, consent obtained. Prepped and draped in sterile fashion. Local anesthetic admin without adverse reaction. Access obtained at RLQ, clear serous fluid retrieved under vacutainer suction. 4.7L total retrieved. Access removed, dermabond applied with bandage. No bleeding, no hematoma. Transported back to radiology waiting per [facility], DCd to care of family.

## 2023-07-19 LAB — CULTURE, BODY FLUID W GRAM STAIN -BOTTLE
Culture: NO GROWTH
Special Requests: ADEQUATE

## 2023-07-20 ENCOUNTER — Other Ambulatory Visit: Payer: Self-pay | Admitting: Diagnostic Radiology

## 2023-07-20 DIAGNOSIS — R188 Other ascites: Secondary | ICD-10-CM

## 2023-07-21 ENCOUNTER — Other Ambulatory Visit: Payer: Self-pay | Admitting: Gastroenterology

## 2023-07-23 ENCOUNTER — Other Ambulatory Visit: Payer: Self-pay | Admitting: Diagnostic Radiology

## 2023-07-23 DIAGNOSIS — K7460 Unspecified cirrhosis of liver: Secondary | ICD-10-CM

## 2023-07-26 ENCOUNTER — Ambulatory Visit
Admission: RE | Admit: 2023-07-26 | Discharge: 2023-07-26 | Disposition: A | Discharge status: Home / Self Care | Source: Ambulatory Visit | Attending: Diagnostic Radiology | Admitting: Diagnostic Radiology

## 2023-07-26 DIAGNOSIS — K766 Portal hypertension: Secondary | ICD-10-CM | POA: Diagnosis not present

## 2023-07-26 DIAGNOSIS — R188 Other ascites: Secondary | ICD-10-CM | POA: Diagnosis not present

## 2023-07-26 DIAGNOSIS — I700 Atherosclerosis of aorta: Secondary | ICD-10-CM | POA: Diagnosis not present

## 2023-07-26 DIAGNOSIS — K7460 Unspecified cirrhosis of liver: Secondary | ICD-10-CM | POA: Diagnosis not present

## 2023-07-26 DIAGNOSIS — R161 Splenomegaly, not elsewhere classified: Secondary | ICD-10-CM | POA: Diagnosis not present

## 2023-07-26 MED ORDER — IOPAMIDOL (ISOVUE-370) INJECTION 76%
75.0000 mL | Freq: Once | INTRAVENOUS | Status: AC | PRN
  Administered 2023-07-26: 75 mL via INTRAVENOUS

## 2023-07-28 ENCOUNTER — Encounter (HOSPITAL_COMMUNITY): Payer: Self-pay

## 2023-07-28 ENCOUNTER — Ambulatory Visit (HOSPITAL_COMMUNITY)
Admission: RE | Admit: 2023-07-28 | Discharge: 2023-07-28 | Disposition: A | Discharge status: Home / Self Care | Source: Ambulatory Visit | Attending: Gastroenterology | Admitting: Gastroenterology

## 2023-07-28 ENCOUNTER — Ambulatory Visit: Payer: Self-pay | Admitting: Gastroenterology

## 2023-07-28 ENCOUNTER — Other Ambulatory Visit (HOSPITAL_COMMUNITY)
Admission: RE | Admit: 2023-07-28 | Discharge: 2023-07-28 | Disposition: A | Discharge status: Home / Self Care | Source: Ambulatory Visit | Attending: Diagnostic Radiology | Admitting: Diagnostic Radiology

## 2023-07-28 DIAGNOSIS — R188 Other ascites: Secondary | ICD-10-CM | POA: Insufficient documentation

## 2023-07-28 DIAGNOSIS — K7460 Unspecified cirrhosis of liver: Secondary | ICD-10-CM | POA: Diagnosis not present

## 2023-07-28 LAB — CBC WITH DIFFERENTIAL/PLATELET
Abs Immature Granulocytes: 0.01 K/uL (ref 0.00–0.07)
Basophils Absolute: 0 K/uL (ref 0.0–0.1)
Basophils Relative: 1 %
Eosinophils Absolute: 0.1 K/uL (ref 0.0–0.5)
Eosinophils Relative: 2 %
HCT: 35.3 % — ABNORMAL LOW (ref 36.0–46.0)
Hemoglobin: 11.1 g/dL — ABNORMAL LOW (ref 12.0–15.0)
Immature Granulocytes: 0 %
Lymphocytes Relative: 15 %
Lymphs Abs: 0.6 K/uL — ABNORMAL LOW (ref 0.7–4.0)
MCH: 27.3 pg (ref 26.0–34.0)
MCHC: 31.4 g/dL (ref 30.0–36.0)
MCV: 86.7 fL (ref 80.0–100.0)
Monocytes Absolute: 0.2 K/uL (ref 0.1–1.0)
Monocytes Relative: 5 %
Neutro Abs: 3 K/uL (ref 1.7–7.7)
Neutrophils Relative %: 77 %
Platelets: 67 K/uL — ABNORMAL LOW (ref 150–400)
RBC: 4.07 MIL/uL (ref 3.87–5.11)
RDW: 16.1 % — ABNORMAL HIGH (ref 11.5–15.5)
WBC: 3.9 K/uL — ABNORMAL LOW (ref 4.0–10.5)
nRBC: 0 % (ref 0.0–0.2)

## 2023-07-28 LAB — BODY FLUID CELL COUNT WITH DIFFERENTIAL
Eos, Fluid: 0 %
Lymphs, Fluid: 54 %
Monocyte-Macrophage-Serous Fluid: 39 % — ABNORMAL LOW (ref 50–90)
Neutrophil Count, Fluid: 7 % (ref 0–25)
Total Nucleated Cell Count, Fluid: 273 uL (ref 0–1000)

## 2023-07-28 LAB — COMPREHENSIVE METABOLIC PANEL WITH GFR
ALT: 12 U/L (ref 0–44)
AST: 27 U/L (ref 15–41)
Albumin: 3.1 g/dL — ABNORMAL LOW (ref 3.5–5.0)
Alkaline Phosphatase: 108 U/L (ref 38–126)
Anion gap: 11 (ref 5–15)
BUN: 14 mg/dL (ref 8–23)
CO2: 20 mmol/L — ABNORMAL LOW (ref 22–32)
Calcium: 8.5 mg/dL — ABNORMAL LOW (ref 8.9–10.3)
Chloride: 104 mmol/L (ref 98–111)
Creatinine, Ser: 1.22 mg/dL — ABNORMAL HIGH (ref 0.44–1.00)
GFR, Estimated: 47 mL/min — ABNORMAL LOW (ref >60)
Glucose, Bld: 192 mg/dL — ABNORMAL HIGH (ref 70–99)
Potassium: 4.8 mmol/L (ref 3.5–5.1)
Sodium: 135 mmol/L (ref 135–145)
Total Bilirubin: 1 mg/dL (ref 0.0–1.2)
Total Protein: 6.7 g/dL (ref 6.5–8.1)

## 2023-07-28 LAB — PROTIME-INR
INR: 1.2 (ref 0.8–1.2)
Prothrombin Time: 15.5 s — ABNORMAL HIGH (ref 11.4–15.2)

## 2023-07-28 LAB — GRAM STAIN: Gram Stain: NONE SEEN

## 2023-07-28 MED ORDER — ALBUMIN HUMAN 25 % IV SOLN
INTRAVENOUS | Status: AC
  Filled 2023-07-28: qty 100

## 2023-07-28 MED ORDER — ALBUMIN HUMAN 25 % IV SOLN
50.0000 g | Freq: Once | INTRAVENOUS | Status: AC
  Administered 2023-07-28: 50 g via INTRAVENOUS

## 2023-07-28 MED ORDER — ALBUMIN HUMAN 25 % IV SOLN
INTRAVENOUS | Status: AC
Start: 2023-07-28 — End: 2023-07-28
  Filled 2023-07-28: qty 100

## 2023-07-28 NOTE — Progress Notes (Signed)
 Patient tolerated Right sided Paracentesis and 50G of IV albumin  well today and 5 Liters of yellow ascites removed with labs collected and taken to lab for processing. Patient verbalized understanding of discharge instructions and ambulatory with cane at departure with no acute distress noted.

## 2023-07-28 NOTE — Procedures (Signed)
 PROCEDURE SUMMARY:  Successful ultrasound guided paracentesis from the right lower quadrant.  Yielded 5.0 L of clear yellow fluid.  No immediate complications.  The patient tolerated the procedure well.   Specimen was sent for labs.  EBL < 5mL  The patient has previously been formally evaluated by the [facility] Interventional Radiology Portal Hypertension Clinic and is being actively followed for potential future intervention.  Clotilda Hesselbach, PA-C

## 2023-07-29 LAB — PATHOLOGIST SMEAR REVIEW: Path Review: REACTIVE

## 2023-08-02 ENCOUNTER — Ambulatory Visit: Admitting: Urology

## 2023-08-02 LAB — CULTURE, BODY FLUID W GRAM STAIN -BOTTLE: Culture: NO GROWTH

## 2023-08-05 DIAGNOSIS — S20212A Contusion of left front wall of thorax, initial encounter: Secondary | ICD-10-CM | POA: Diagnosis not present

## 2023-08-09 ENCOUNTER — Other Ambulatory Visit (HOSPITAL_COMMUNITY): Payer: Self-pay | Admitting: Gastroenterology

## 2023-08-09 DIAGNOSIS — R188 Other ascites: Secondary | ICD-10-CM

## 2023-08-11 ENCOUNTER — Encounter (HOSPITAL_COMMUNITY): Payer: Self-pay

## 2023-08-11 ENCOUNTER — Ambulatory Visit (HOSPITAL_COMMUNITY)
Admission: RE | Admit: 2023-08-11 | Discharge: 2023-08-11 | Disposition: A | Discharge status: Home / Self Care | Source: Ambulatory Visit | Attending: Gastroenterology | Admitting: Gastroenterology

## 2023-08-11 DIAGNOSIS — R188 Other ascites: Secondary | ICD-10-CM | POA: Diagnosis not present

## 2023-08-11 DIAGNOSIS — K7460 Unspecified cirrhosis of liver: Secondary | ICD-10-CM | POA: Diagnosis not present

## 2023-08-11 LAB — GRAM STAIN

## 2023-08-11 LAB — BODY FLUID CELL COUNT WITH DIFFERENTIAL
Eos, Fluid: 0 %
Lymphs, Fluid: 49 %
Monocyte-Macrophage-Serous Fluid: 36 % — ABNORMAL LOW (ref 50–90)
Neutrophil Count, Fluid: 15 % (ref 0–25)
Total Nucleated Cell Count, Fluid: 185 uL (ref 0–1000)

## 2023-08-11 MED ORDER — ALBUMIN HUMAN 25 % IV SOLN
50.0000 g | Freq: Once | INTRAVENOUS | Status: AC
  Administered 2023-08-11: 50 g via INTRAVENOUS

## 2023-08-11 MED ORDER — ALBUMIN HUMAN 25 % IV SOLN
INTRAVENOUS | Status: AC
  Filled 2023-08-11: qty 200

## 2023-08-11 NOTE — Progress Notes (Signed)
 Patient tolerated right sided Paracentesis and 50G of IV albumin  well today and labs collected/sent to lab for processing. Patient verbalized understanding of discharge instructions and left via wheelchair with son with no acute distress noted.

## 2023-08-11 NOTE — Procedures (Signed)
 PROCEDURE SUMMARY:  Successful ultrasound guided paracentesis from the right lower quadrant.  Yielded 5 L of clear yellow fluid.  No immediate complications.  The patient tolerated the procedure well.   Specimen sent for labs.  EBL < 2 mL  The patient has previously been formally evaluated by the [facility] Interventional Radiology Portal Hypertension Clinic and is being actively followed for potential future intervention. ** Patient is currently undergoing pre-procedure work up for TIPS.   Tarnisha Kachmar, AGACNP-BC 08/11/2023, 3:10 PM

## 2023-08-12 ENCOUNTER — Ambulatory Visit: Payer: Self-pay | Admitting: Gastroenterology

## 2023-08-16 LAB — CULTURE, BODY FLUID W GRAM STAIN -BOTTLE
Culture: NO GROWTH
Special Requests: ADEQUATE

## 2023-08-17 ENCOUNTER — Ambulatory Visit (HOSPITAL_COMMUNITY)

## 2023-08-17 ENCOUNTER — Encounter (HOSPITAL_COMMUNITY): Payer: Self-pay

## 2023-08-23 ENCOUNTER — Other Ambulatory Visit (HOSPITAL_COMMUNITY): Payer: Self-pay | Admitting: Gastroenterology

## 2023-08-23 DIAGNOSIS — R188 Other ascites: Secondary | ICD-10-CM

## 2023-08-25 ENCOUNTER — Telehealth (HOSPITAL_COMMUNITY): Admitting: Psychiatry

## 2023-08-25 ENCOUNTER — Encounter (HOSPITAL_COMMUNITY): Payer: Self-pay | Admitting: Psychiatry

## 2023-08-25 ENCOUNTER — Ambulatory Visit (HOSPITAL_COMMUNITY)
Admission: RE | Admit: 2023-08-25 | Discharge: 2023-08-25 | Disposition: A | Discharge status: Home / Self Care | Source: Ambulatory Visit | Attending: Gastroenterology | Admitting: Gastroenterology

## 2023-08-25 DIAGNOSIS — R188 Other ascites: Secondary | ICD-10-CM | POA: Insufficient documentation

## 2023-08-25 DIAGNOSIS — F3162 Bipolar disorder, current episode mixed, moderate: Secondary | ICD-10-CM

## 2023-08-25 LAB — GRAM STAIN

## 2023-08-25 LAB — BODY FLUID CELL COUNT WITH DIFFERENTIAL
Lymphs, Fluid: 61 %
Monocyte-Macrophage-Serous Fluid: 26 % — ABNORMAL LOW (ref 50–90)
Neutrophil Count, Fluid: 13 % (ref 0–25)
Total Nucleated Cell Count, Fluid: 271 uL (ref 0–1000)

## 2023-08-25 MED ORDER — DIAZEPAM 2 MG PO TABS: 2.0000 mg | ORAL_TABLET | Freq: Every day | ORAL | 2 refills | Status: DC | PRN

## 2023-08-25 MED ORDER — ALBUMIN HUMAN 25 % IV SOLN
INTRAVENOUS | Status: AC
  Filled 2023-08-25: qty 100

## 2023-08-25 MED ORDER — BUPROPION HCL ER (XL) 150 MG PO TB24: 150.0000 mg | ORAL_TABLET | ORAL | 2 refills | Status: DC

## 2023-08-25 MED ORDER — ALBUMIN HUMAN 25 % IV SOLN
12.5000 g | Freq: Once | INTRAVENOUS | Status: AC
  Administered 2023-08-25 (×2): 50 g via INTRAVENOUS

## 2023-08-25 MED ORDER — MIRTAZAPINE 15 MG PO TABS: 15.0000 mg | ORAL_TABLET | Freq: Every day | ORAL | 2 refills | Status: DC

## 2023-08-25 MED ORDER — PROPRANOLOL HCL ER 80 MG PO CP24: 80.0000 mg | ORAL_CAPSULE | Freq: Every day | ORAL | 2 refills | Status: DC

## 2023-08-25 NOTE — Progress Notes (Signed)
 Patient brought to Us  Rm 1 per WC, in no acute distress. Paracentesis explained, consent obtained. Prepped and draped in sterile manner. Access obtained in RLQ. 5L clear yellow serous fluid withdrawn without difficulty. Access removed without complication. Albumin  admin without adverse reaction. Dermabond applied to access, bandage in place. No bleeding no hematoma. Escorted to radiology waiting to care of family.

## 2023-08-25 NOTE — Progress Notes (Signed)
 Virtual Visit via Video Note  I connected with Andrea Dickson on 08/25/23 at  1:00 PM EDT by a video enabled telemedicine application and verified that I am speaking with the correct person using two identifiers.  Location: Patient: home Provider: office   I discussed the limitations of evaluation and management by telemedicine and the availability of in person appointments. The patient expressed understanding and agreed to proceed.    I discussed the assessment and treatment plan with the patient. The patient was provided an opportunity to ask questions and all were answered. The patient agreed with the plan and demonstrated an understanding of the instructions.   The patient was advised to call back or seek an in-person evaluation if the symptoms worsen or if the condition fails to improve as anticipated.  I provided 20 minutes of non-face-to-face time during this encounter.   Barnie Gull, MD  [facility] MD/PA/NP OP Progress Note  08/25/2023 1:19 PM Andrea Dickson  MRN:  [id number]  Chief Complaint:  Chief Complaint  Patient presents with   Manic Behavior   Depression   Anxiety   Follow-up   HPI: This patient is a 73 year old separated white female who lives alone in [place]. She has 1 son and 3 grandchildren. She is retired from [employer]. She has a history of depression anxiety and possible bipolar disorder   The patient returns for follow-up after 3 months regarding her depression and anxiety and tremor.  She is dealing with some significant health issues.  She has idiopathic liver cirrhosis and has to have amniocentesis every 2 weeks.  Obviously this is quite uncomfortable.  She is also been losing her balance and falling quite a bit.  She is not sleeping at night and she is very tired.  The Valium  at night is not helping.  She also has very little appetite.  I suggested that we stop the Valium  at bedtime and try mirtazapine  which can help sleep and appetite.  She is  looking at having a surgery that would help with the liver cirrhosis but she is very worried about it.  It does sound as if that might be helpful but is not 100% for sure.  She denies any thoughts of self-harm or suicide but has been somewhat discouraged about her health recently.  Her tremor is much improved with the propranolol  by her report Visit Diagnosis:    ICD-10-CM   1. Bipolar 1 disorder, mixed, moderate (HCC)  F31.62       Past Psychiatric History: Past outpatient treatment  Past Medical History:  Past Medical History:  Diagnosis Date   Anxiety    Asthma    Bipolar 1 disorder (HCC)    Chronic diarrhea    Cirrhosis (HCC)    Diagnosed September 2020, likely secondary to [facility], s/p Hep A/B vaccination in 2021.    Depression    Diabetes (HCC)    Essential tremor    GERD (gastroesophageal reflux disease)    Headache    High cholesterol     Past Surgical History:  Procedure Laterality Date   ABDOMINAL HYSTERECTOMY  1997   BIOPSY  01/17/2018   Procedure: BIOPSY;  Surgeon: Shaaron Lamar HERO, MD;  [facility];  Service: Endoscopy;;  colon   carpal tunnel Bilateral 1999, 06/2009   COLONOSCOPY  2013   Dr. Donnel: no colon polyps. quality of prep was fair. repeat colonoscopy in five years.    COLONOSCOPY WITH PROPOFOL  N/A 01/17/2018   Dr. Shaaron:  Colonic lipoma, 2 tubular adenomas removed.  Random colon biopsies negative.  Next colonoscopy 5 years.   COLONOSCOPY WITH PROPOFOL  N/A 09/26/2022   Procedure: COLONOSCOPY WITH PROPOFOL ;  Surgeon: Eartha Angelia Sieving, MD;  [facility];  Service: Gastroenterology;  Laterality: N/A;   ESOPHAGOGASTRODUODENOSCOPY N/A 04/19/2023   Procedure: EGD (ESOPHAGOGASTRODUODENOSCOPY);  Surgeon: Cindie Carlin POUR, DO;  [facility];  Service: Endoscopy;  Laterality: N/A;  1:45 pm, asa 3   ESOPHAGOGASTRODUODENOSCOPY (EGD) WITH PROPOFOL  N/A 04/06/2019   Procedure: ESOPHAGOGASTRODUODENOSCOPY (EGD) WITH PROPOFOL ;  Surgeon:  Shaaron Lamar HERO, MD;   Normal esophagus, mild portal hypertensive gastropathy, otherwise normal exam.  Recommend repeat EGD in 2 years for variceal screening.    ESOPHAGOGASTRODUODENOSCOPY (EGD) WITH PROPOFOL  N/A 01/19/2022   Procedure: ESOPHAGOGASTRODUODENOSCOPY (EGD) WITH PROPOFOL ;  Surgeon: Shaaron Lamar HERO, MD;  [facility];  Service: Endoscopy;  Laterality: N/A;  12:45 pm, pt knows to arrive at 9:15   ESOPHAGOGASTRODUODENOSCOPY (EGD) WITH PROPOFOL  N/A 09/25/2022   Procedure: ESOPHAGOGASTRODUODENOSCOPY (EGD) WITH PROPOFOL ;  Surgeon: Eartha Angelia Sieving, MD;  [facility];  Service: Gastroenterology;  Laterality: N/A;   GIVENS CAPSULE STUDY N/A 06/16/2023   Procedure: IMAGING PROCEDURE, GI TRACT, INTRALUMINAL, VIA CAPSULE;  Surgeon: Cindie Carlin POUR, DO;  [facility];  Service: Endoscopy;  Laterality: N/A;  730am   IR RADIOLOGIST EVAL & MGMT  04/28/2023   POLYPECTOMY  01/17/2018   Procedure: POLYPECTOMY;  Surgeon: Shaaron Lamar HERO, MD;  [facility];  Service: Endoscopy;;  colon   POLYPECTOMY  09/26/2022   Procedure: POLYPECTOMY;  Surgeon: Eartha Angelia Sieving, MD;  [facility];  Service: Gastroenterology;;   SKIN CANCER EXCISION  2007   TONSILLECTOMY  1965   ulner nerve  Left 06/2009   decompression    Family Psychiatric History: See below  Family History:  Family History  Problem Relation Age of Onset   Diabetes Mother    Brain cancer Mother    Diabetes Father    Heart attack Father    Heart disease Father    Diabetes Sister    Bipolar disorder Brother    Diabetes Brother    Heart disease Brother    Colon cancer Neg Hx    Breast cancer Neg Hx     Social History:  Social History   Socioeconomic History   Marital status: Divorced    Spouse name: Not on file   Number of children: 1   Years of education: 12   Highest education level: Not on file  Occupational History   Occupation: retired    Comment: employed  at [employer]  Tobacco Use   Smoking status: Every Day    Current packs/day: 0.50    Average packs/day: 0.5 packs/day for 43.0 years (21.5 ttl pk-yrs)    Types: Cigarettes   Smokeless tobacco: Never   Tobacco comments:    smoking since 73yrs old.    Vaping Use   Vaping status: Never Used  Substance and Sexual Activity   Alcohol use: No    Alcohol/week: 0.0 standard drinks of alcohol   Drug use: No   Sexual activity: Not Currently  Other Topics Concern   Not on file  Social History Narrative   Lives alone.  Retired.  Education 12th grade.  One child.     Caffeine use- sodas, 2 daily   Social Drivers of Health   Financial Resource Strain: Low Risk  (11/10/2021)   Received from [facility]   Overall Financial Resource Strain (CARDIA)    Difficulty of Paying Living Expenses: Not hard at all  Food Insecurity: No Food Insecurity (09/25/2022)   Hunger Vital Sign    Worried About Running Out of Food in the Last Year: Never true    Ran Out of Food in the Last Year: Never true  Transportation Needs: No Transportation Needs (09/25/2022)   PRAPARE - Administrator, Civil Service (Medical): No    Lack of Transportation (Non-Medical): No  Physical Activity: Inactive (11/10/2021)   [facility]   Exercise Vital Sign    On average, how many days per week do you engage in moderate to strenuous exercise (like a brisk walk)?: 0 days    On average, how many minutes do you engage in exercise at this level?: 0 min  Stress: No Stress Concern Present (11/10/2021)   Received from [facility] - Occupational Stress Questionnaire    Feeling of Stress : Not at all  Social Connections: Socially Isolated (11/10/2021)   Received from [facility]   Social Connection and Isolation Panel    In a typical week, how many times do you talk on the phone with family, friends, or neighbors?: More than three times a week     How often do you get together with friends or relatives?: Once a week    How often do you attend church or religious services?: Never    Do you belong to any clubs or organizations such as church groups, unions, fraternal or athletic groups, or school groups?: No    How often do you attend meetings of the clubs or organizations you belong to?: Never    Are you married, widowed, divorced, separated, never married, or living with a partner?: Divorced    Allergies:  Allergies  Allergen Reactions   Pramipexole Other (See Comments)    Shaking, palpitations, headache, faint feeling   Crestor [Rosuvastatin Calcium ]     Muscle pain   Bee Pollen Other (See Comments)    Eyes and nose run   Pollen Extract Other (See Comments)    Eyes and nose run    Metabolic Disorder Labs: Lab Results  Component Value Date   HGBA1C 5.4 04/24/2015   MPG 108 04/24/2015   No results found for: PROLACTIN Lab Results  Component Value Date   CHOL 178 02/17/2016   TRIG 229 (H) 02/17/2016   HDL 30 (L) 02/17/2016   CHOLHDL 5.9 (H) 02/17/2016   VLDL 46 (H) 02/17/2016   LDLCALC 102 (H) 02/17/2016   LDLCALC 126 (H) 11/18/2015   Lab Results  Component Value Date   TSH 1.00 12/24/2021   TSH 2.96 08/29/2019    Therapeutic Level Labs: Lab Results  Component Value Date   LITHIUM  0.8 08/29/2019   LITHIUM  0.4 (L) 10/13/2018   Lab Results  Component Value Date   VALPROATE 62.2 08/20/2016   VALPROATE 83.0 10/14/2015   No results found for: CBMZ  Current Medications: Current Outpatient Medications  Medication Sig Dispense Refill   mirtazapine  (REMERON ) 15 MG tablet Take 1 tablet (15 mg total) by mouth at bedtime. 30 tablet 2   acetaminophen  (TYLENOL ) 500 MG tablet Take 1,000 mg by mouth 2 (two) times daily as needed for moderate pain or headache.     amoxicillin  (AMOXIL ) 500 MG capsule Take 1,000 mg by mouth 3 (three) times daily. (Patient not taking: Reported on  06/09/2023)     buPROPion  (WELLBUTRIN   XL) 150 MG 24 hr tablet Take 1 tablet (150 mg total) by mouth every morning. 90 tablet 2   cetirizine (ZYRTEC) 10 MG tablet Take 10 mg by mouth daily. Aller-Tec     Cholecalciferol (VITAMIN D3) 50 MCG (2000 UT) TABS Take 2,000 Units by mouth daily.      diazepam  (VALIUM ) 2 MG tablet Take 1 tablet (2 mg total) by mouth daily as needed for anxiety. 90 tablet 2   estrogens , conjugated, (PREMARIN ) 1.25 MG tablet Take 1.25 mg by mouth daily.      ezetimibe (ZETIA) 10 MG tablet Take 10 mg by mouth daily.     fluticasone (FLONASE) 50 MCG/ACT nasal spray Place 2 sprays into both nostrils daily.     furosemide  (LASIX ) 40 MG tablet Take 1 tablet (40 mg total) by mouth daily. 30 tablet 11   hyoscyamine  (LEVSIN  SL) 0.125 MG SL tablet Place 1 tablet (0.125 mg total) under the tongue every 6 (six) hours as needed for cramping. 30 tablet 1   Lactobacillus-Inulin (CULTURELLE DIGESTIVE HEALTH) CAPS Take 1 capsule by mouth daily.     omeprazole  (PRILOSEC) 40 MG capsule TAKE ONE (1) CAPSULE BY MOUTH TWICE DAILY AT BREAKFAST AND AT BEDTIME 60 capsule 11   ondansetron  (ZOFRAN ) 4 MG tablet TAKE 1 TABLET BY MOUTH EVERY 8 HOURS AS NEEDED FOR NAUSEA & VOMITING 30 tablet 10   Pancrelipase , Lip-Prot-Amyl, (ZENPEP ) 60000-189600 units CPEP Take 1 capsule by mouth 3 (three) times daily with meals. 100 capsule 11   propranolol  ER (INDERAL  LA) 80 MG 24 hr capsule Take 1 capsule (80 mg total) by mouth daily. 90 capsule 2   spironolactone  (ALDACTONE ) 100 MG tablet Take 1 tablet (100 mg total) by mouth daily. 30 tablet 11   trimethoprim  (TRIMPEX ) 100 MG tablet TAKE 1 TABLET BY MOUTH EVERY DAY AT BEDTIME 289 tablet 11   No current facility-administered medications for this visit.     Musculoskeletal: Strength & Muscle Tone: decreased Gait & Station: unsteady Patient leans: N/A  Psychiatric Specialty Exam: Review of Systems  Gastrointestinal:  Positive for nausea.  Musculoskeletal:  Positive for gait problem.   Neurological:  Positive for tremors.  Psychiatric/Behavioral:  Positive for sleep disturbance.   All other systems reviewed and are negative.   There were no vitals taken for this visit.There is no height or weight on file to calculate BMI.  General Appearance: Casual and Fairly Groomed  Eye Contact:  Good  Speech:  Clear and Coherent  Volume:  Normal  Mood:  Anxious  Affect:  Congruent  Thought Process:  Goal Directed  Orientation:  Full (Time, Place, and Person)  Thought Content: Rumination   Suicidal Thoughts:  No  Homicidal Thoughts:  No  Memory:  Immediate;   Good Recent;   Good Remote;   Fair  Judgement:  Good  Insight:  Fair  Psychomotor Activity:  Decreased and Tremor  Concentration:  Concentration: Good and Attention Span: Good  Recall:  Good  Fund of Knowledge: Good  Language: Good  Akathisia:  No  Handed:  Right  AIMS (if indicated): not done  Assets:  Communication Skills Desire for Improvement Resilience Social Support  ADL's:  Intact  Cognition: WNL  Sleep:  Poor   Screenings: PHQ2-9    Flowsheet Row Video Visit from 09/30/2021 in [facility] [place] Video Visit from 06/26/2021 in [facility] at [place] Video Visit from 03/26/2021 in  [place] [facility] Video Visit from 12/18/2020 in [facility] at [place] Video Visit from 09/24/2020 in [facility] at [facility] Total Score 0 0 0 0 0   Flowsheet Row Admission (Discharged) from 04/19/2023 in [facility] to Hosp-Admission (Discharged) from 09/24/2022 in [facility] ED from 07/25/2022 in [facility] Emergency Department at [facility]  C-SSRS RISK CATEGORY No Risk No Risk No Risk     Assessment and Plan:  This patient is a 73 year old female with a history of bipolar disorder anxiety tremor nonalcoholic  liver disease with associated ascites and varices.  She is faced with a lot of difficult medical issues and this is probably causing her to lay awake and worry.  The Valium  at bedtime is not helping so we will discontinue it in favor of mirtazapine  15 mg at bedtime for sleep and anxiety.  She will continue Valium  2 mg once daily as needed for anxiety during the day and Wellbutrin  XL 150 mg daily for depression as well as Inderal  LA 80 mg every morning for tremor.  She will return to see me in 4 weeks Collaboration of Care: Collaboration of Care: Other provider involved in patient's care AEB notes that shared with GI on the epic system  Patient/Guardian was advised Release of Information must be obtained prior to any record release in order to collaborate their care with an outside provider. Patient/Guardian was advised if they have not already done so to contact the registration department to sign all necessary forms in order for us  to release information regarding their care.   Consent: Patient/Guardian gives verbal consent for treatment and assignment of benefits for services provided during this visit. Patient/Guardian expressed understanding and agreed to proceed.    Barnie Gull, MD 08/25/2023, 1:19 PM

## 2023-08-25 NOTE — Procedures (Signed)
 PROCEDURE SUMMARY:  Successful ultrasound guided paracentesis from the right lower quadrant.  Yielded 5 L of clear yellow fluid.  No immediate complications.  The patient tolerated the procedure well.   Specimen sent for labs.  EBL < 2 mL  The patient has previously been formally evaluated by the [facility] Interventional Radiology Portal Hypertension [facility] and is being actively followed for potential future intervention.  ** Patient is currently undergoing pre-procedure work up for TIPS.    Ninoska Goswick, AGACNP-BC 08/25/2023, 12:22 PM

## 2023-08-30 LAB — CULTURE, BODY FLUID W GRAM STAIN -BOTTLE
Culture: NO GROWTH
Special Requests: ADEQUATE

## 2023-08-31 ENCOUNTER — Ambulatory Visit: Attending: Cardiology

## 2023-08-31 DIAGNOSIS — Z01810 Encounter for preprocedural cardiovascular examination: Secondary | ICD-10-CM

## 2023-08-31 DIAGNOSIS — R188 Other ascites: Secondary | ICD-10-CM

## 2023-09-01 LAB — ECHOCARDIOGRAM COMPLETE
AR max vel: 2.7 cm2
AV Area VTI: 3.04 cm2
AV Area mean vel: 2.68 cm2
AV Mean grad: 3 mmHg
AV Peak grad: 6.3 mmHg
Ao pk vel: 1.25 m/s
Area-P 1/2: 2.84 cm2
Calc EF: 71.4 %
MV VTI: 2.47 cm2
S' Lateral: 2.3 cm
Single Plane A2C EF: 70.9 %
Single Plane A4C EF: 72 %

## 2023-09-03 NOTE — Progress Notes (Unsigned)
 GI Office Note    Referring Provider: Lari Elspeth BRAVO, MD Primary Care Physician:  Andrea Elspeth BRAVO, MD Primary Gastroenterologist: Andrea HERO.Rourk, MD  Date:  09/07/2023  ID:  Andrea Dickson, Blase 11-23-1950, MRN [id number]  Chief Complaint   Chief Complaint  Patient presents with   Follow-up    Follow up. No problems    History of Present Illness  Andrea Dickson is a 73 y.o. female with a history of cirrhosis, chronic diarrhea nausea, HLD, essential tremor, bipolar, and diabetes presenting today with complaint of fatigue, ascites, malnutrition, falls.   EGD 01/19/2022: -3 columns of grade 2/limited grade 3 varices -Portal hypertensive gastropathy -Friable gastric mucosa -Continue Inderal  daily -No repeat EGD needed as long as no evidence of upper GI bleed   Colonoscopy Jan 2020: colonic lipoma, tubular adenomas, random colonic biopsies negative.    Underwent paracentesis in June 2024.   CTA/BRTO protocol 08/17/2022: -Cirrhosis -Evidence of portal hypertension with ascites and splenomegaly -Small hiatal hernia -Decompressed gallbladder -No evidence of portal vein thrombosis   OV 09/17/2022.  Patient reported history of chronic diarrhea with evaluation that included a trial of Xifaxan  in January 2024 of IBS-D without any significant improvement.  Continues to have intermittent soft and loose stools.  Noted to have prior negative workup of negative colon biopsies, celiac screen negative, and trialed off metformin with no improvement.  Fecal elastase normal.  Dicyclomine  also not helpful in the past.  Was given trial of Zenpep  samples with some improvement.  Still sometimes has diarrhea no matter what she eats.  Denied any abdominal distention, confusion, mental status changes, jaundice, or pruritus.  Given increasing creatinine she is currently doing diuretics every other day initially and then went back to every day given increase of fluid in her extremities.   On 10/7  -patient reported feeling like she needed paracentesis, and I recommended that she could obtain a para at [facility] to remove no more than 4 L and give 25 g of albumin .  Appears patient was referred to nephrology in the latter part of September.   EGD 09/25/2022: -Grade 3 esophageal varices -Portal hypertensive gastropathy -Normal duodenum -Advised platelets -Start propranolol  80 mg daily after colonoscopy   Colonoscopy 09/26/2022: -Hemorrhoids and perianal exam, internal hemorrhoids -Two 2-4 mm polyps in the descending and ascending colon -2-3-4 mm polyps in the descending colon -path: tubular adenomas -Repeat in 5 years.    OV 10/26/22. Stools start normal then turn loose. Wearing depends just in case. Has urgency. Zenpep  worked briefly for a few days and then reported a return of symptoms.  Reportedly having relief with Levsin .  She is taking her diuretics as prescribed as she states she cannot breathe if she does not take them.  Had reported some very little BRBPR.  Seeing nephrology.  Taking Zofran  in the morning to help with nausea as well as continuing her PPI twice daily.  She has stopped Imodium at recommendation of PCP.  They also removed her Trimpex  and ibuprofen.  Recalled for ultrasound in February.  Advised to continue Lasix  20 and Aldactone  50 mg daily.  Continue propranolol  as well as omeprazole .  Increased Zenpep  to 60,000 units with meals.  Advised another trial of Xifaxan  if no improvement with Zenpep .  Complete MELD labs with AFP in 3 months.  Discussed 2 g sodium diet.   OV  3/37/25. Prior to this OV her diuretics were increased (lasix  40mg , aldactone  100mg ) given weight gain and advised recheck of  labs.  This visit she presented with significant fatigue and having issues with mobility at home and some frequent falls.  Reports more fluid in her belly feeling very distended in the mornings although does have some mild improvement after taking diuretics.  Reported she is seeing  some black stool but not any blood in her stool.  Reported bruising/petechiae on her skin on her arms and legs.  Reportedly eating mostly chips and dips as well as tomato soup, not eating much meat.  He recently quit smoking.  Continues to take Zenpep  60,000 units with meals and only having 3-4 BMs daily.  Labs ordered as well as right upper quadrant ultrasound and EGD ASAP to assess for bleeding varices.  Advised continue Zenpep , omeprazole , and diuretics at current dose.  Reinforced need for 2 g sodium diet.   RUQ US  04/12/23: - cirrhosis -no focal liver lesion   EGD 04/19/23: - Grade II esophageal varices with no bleeding and no stigmata of recent bleeding.  - Portal hypertensive gastropathy.  - Normal duodenal bulb, first portion of the duodenum and second portion of the duodenum.  - No specimens collected. - Consider TIPS evaluation given ongoing issues with hypervolemia and sensitivity to diuretics.  - Patient distended today. Will order paracentesis.  - Follow up in GI office in 4- 6 weeks  - Consider capsule endoscopy for anemia.   IR eval for TIPS 4/16: Reported to be a good candidate for TIPS however she will want to discuss this with her family and see how currently ascites reaccumulate's.  Plan for 20-month follow-up and rediscuss at that time.  Last office visit 06/09/23.Taking diuretics but still having distention. 2-3 BM daily on average although no BM in couple days recently. History of varices without bleeding. Bloating after meals and staying fatigued. Having some back pain. Taking Zenpep  only once daily. Despite not wanting to eat she snacks often and usually is dips, chips, pickles, and tomato soup. Advised givens capsule, labs, 2g sodium diet. Protein intake encouraged and continue diuretics . Continue zenpep  and pantoprazole . Also continue NSBB (propranolol ). Again encouraged TIPs.   Capsule study 06/16/23: Portal hypertensive enteropathy and linear erosions noted. Some views  obscured. Anemia suspected to be secondary ot portal gastropathy and enteropathy. Continued to recommend TIPS and monitoring for GI bleeding and regular labs.   CT angio A/P 07/27/23: 1. Portal venous system is patent. No significant gastric or esophageal varices. 2. Main hepatic veins are patent. 3. Atherosclerotic disease in the abdominal aorta without aneurysm. 4. Cirrhosis with portal hypertension. Splenomegaly and ascites has progressed since the exam on 08/17/2022.  Has been completing paracentesis every 2 weeks since 06/16/23 obtaining average of 5L removed. No SBP.   Echo 08/31/23:  -EF 70-75% -Grade 1 diastolic dysfunction.   Today:  Discussed the use of AI scribe software for clinical note transcription with the patient, who gave verbal consent to proceed.  Labs - July 2025 MELD 3.0: 13 at 07/28/2023  1:13 PM MELD-Na: 10 at 07/28/2023  1:13 PM Calculated from: Serum Creatinine: 1.22 mg/dL at 2/83/7974  8:86 PM Serum Sodium: 135 mmol/L at 07/28/2023  1:13 PM Total Bilirubin: 1 mg/dL at 2/83/7974  8:86 PM Serum Albumin : 3.1 g/dL at 2/83/7974  8:86 PM INR(ratio): 1.2 at 07/28/2023  1:13 PM Age at listing (hypothetical): 73 years Sex: Female at 07/28/2023  1:13 PM  US : March 2025 without mention of any focal liver lesion., recent CTA 07/26/23 with increased atrophy of right hepatic lobe with enlargement of  left hepatic lobe and worsening ascites.  No biliary dilation.  No mention of any focal liver lesion AFP: normal March 2025 EGD:  April 2025 with grade 2 esophageal varices without recent bleeding and portal hypertensive gastropathy.  BB: Propranolol  80 mg daily Ascites/peripheral edema: Ongoing ascites.  Paracentesis every 2 weeks with 5 L removed. Diuretics: Aldactone  100 mg daily, Lasix  40 mg daily Paracentesis: Scheduled for tomorrow 8/27 History of SBP: None Encephalopathy:  none but having falls recently and weak legs.   She experiences significant leg weakness, describing  episodes where she 'could not feel my legs at all,' leading to falls. Her legs are not swollen during these episodes, although she experiences occasional swelling in one leg, particularly after paracentesis. She has difficulty with mobility and requires assistance to attend appointments as she is no longer able to drive. She is considering obtaining a wheelchair as her current walker is inadequate for her needs.  She is undergoing a workup for a TIPS procedure and has completed an electrocardiogram, CT scans, and blood work. She experiences significant fluid retention, which she finds intolerable. Paracentesis provides temporary relief, but she is concerned about the impact of fluid on her daily life, including her ability to eat and her overall energy levels.  She reports nausea and occasional vomiting, which she manages with Zofran . She has noticed black, tarry stools occurring multiple times a week, described as 'black as your pants.' No red streaking in stools, and she denies taking iron, Pepto Bismol, or kaopectate. She is not on blood thinners.  Her appetite is poor, and she often relies on her son or granddaughter to bring her meals. She tries to prepare meals herself but sometimes does not eat. She has been prescribed Remeron  for sleep but has not yet received it. She previously took Valium . She is on diuretics but is concerned about kidney function, which has limited the ability to increase her diuretic dosage.  She has not been to a grocery store in a long time and relies on family for shopping. She spends most of her time at home, which she finds isolating. She is curious about assistance options with transportation and home care but is hesitant about having someone in her home.       Wt Readings from Last 5 Encounters:  09/07/23 165 lb 3.2 oz (74.9 kg)  06/09/23 168 lb (76.2 kg)  04/19/23 153 lb 14.1 oz (69.8 kg)  04/08/23 154 lb (69.9 kg)  10/26/22 144 lb 12.8 oz (65.7 kg)    Current  Outpatient Medications  Medication Sig Dispense Refill   acetaminophen  (TYLENOL ) 500 MG tablet Take 1,000 mg by mouth 2 (two) times daily as needed for moderate pain or headache.     buPROPion  (WELLBUTRIN  XL) 150 MG 24 hr tablet Take 1 tablet (150 mg total) by mouth every morning. 90 tablet 2   cetirizine (ZYRTEC) 10 MG tablet Take 10 mg by mouth daily. Aller-Tec     Cholecalciferol (VITAMIN D3) 50 MCG (2000 UT) TABS Take 2,000 Units by mouth daily.      diazepam  (VALIUM ) 2 MG tablet Take 1 tablet (2 mg total) by mouth daily as needed for anxiety. 90 tablet 2   estrogens , conjugated, (PREMARIN ) 1.25 MG tablet Take 1.25 mg by mouth daily.      ezetimibe (ZETIA) 10 MG tablet Take 10 mg by mouth daily.     fluticasone (FLONASE) 50 MCG/ACT nasal spray Place 2 sprays into both nostrils daily.     furosemide  (  LASIX ) 40 MG tablet Take 1 tablet (40 mg total) by mouth daily. 30 tablet 11   hyoscyamine  (LEVSIN  SL) 0.125 MG SL tablet Place 1 tablet (0.125 mg total) under the tongue every 6 (six) hours as needed for cramping. 30 tablet 1   Lactobacillus-Inulin (CULTURELLE DIGESTIVE HEALTH) CAPS Take 1 capsule by mouth daily.     mirtazapine  (REMERON ) 15 MG tablet Take 1 tablet (15 mg total) by mouth at bedtime. 30 tablet 2   omeprazole  (PRILOSEC) 40 MG capsule TAKE ONE (1) CAPSULE BY MOUTH TWICE DAILY AT BREAKFAST AND AT BEDTIME 60 capsule 11   ondansetron  (ZOFRAN ) 4 MG tablet TAKE 1 TABLET BY MOUTH EVERY 8 HOURS AS NEEDED FOR NAUSEA & VOMITING 30 tablet 10   Pancrelipase , Lip-Prot-Amyl, (ZENPEP ) 60000-189600 units CPEP Take 1 capsule by mouth 3 (three) times daily with meals. 100 capsule 11   propranolol  ER (INDERAL  LA) 80 MG 24 hr capsule Take 1 capsule (80 mg total) by mouth daily. 90 capsule 2   spironolactone  (ALDACTONE ) 100 MG tablet Take 1 tablet (100 mg total) by mouth daily. 30 tablet 11   trimethoprim  (TRIMPEX ) 100 MG tablet TAKE 1 TABLET BY MOUTH EVERY DAY AT BEDTIME 289 tablet 11   amoxicillin   (AMOXIL ) 500 MG capsule Take 1,000 mg by mouth 3 (three) times daily. (Patient not taking: Reported on 06/09/2023)     No current facility-administered medications for this visit.    Past Medical History:  Diagnosis Date   Anxiety    Asthma    Bipolar 1 disorder (HCC)    Chronic diarrhea    Cirrhosis (HCC)    Diagnosed September 2020, likely secondary to [facility], s/p Hep A/B vaccination in 2021.    Depression    Diabetes (HCC)    Essential tremor    GERD (gastroesophageal reflux disease)    Headache    High cholesterol     Past Surgical History:  Procedure Laterality Date   ABDOMINAL HYSTERECTOMY  1997   BIOPSY  01/17/2018   Procedure: BIOPSY;  Surgeon: Shaaron Andrea HERO, MD;  [facility];  Service: Endoscopy;;  colon   carpal tunnel Bilateral 1999, 06/2009   COLONOSCOPY  2013   Dr. Donnel: no colon polyps. quality of prep was fair. repeat colonoscopy in five years.    COLONOSCOPY WITH PROPOFOL  N/A 01/17/2018   Dr. Shaaron: Colonic lipoma, 2 tubular adenomas removed.  Random colon biopsies negative.  Next colonoscopy 5 years.   COLONOSCOPY WITH PROPOFOL  N/A 09/26/2022   Procedure: COLONOSCOPY WITH PROPOFOL ;  Surgeon: Eartha Angelia Sieving, MD;  Location: [facility];  Service: Gastroenterology;  Laterality: N/A;   ESOPHAGOGASTRODUODENOSCOPY N/A 04/19/2023   Procedure: EGD (ESOPHAGOGASTRODUODENOSCOPY);  Surgeon: Cindie Carlin POUR, DO;  Location: [facility];  Service: Endoscopy;  Laterality: N/A;  1:45 pm, asa 3   ESOPHAGOGASTRODUODENOSCOPY (EGD) WITH PROPOFOL  N/A 04/06/2019   Procedure: ESOPHAGOGASTRODUODENOSCOPY (EGD) WITH PROPOFOL ;  Surgeon: Shaaron Andrea HERO, MD;   Normal esophagus, mild portal hypertensive gastropathy, otherwise normal exam.  Recommend repeat EGD in 2 years for variceal screening.    ESOPHAGOGASTRODUODENOSCOPY (EGD) WITH PROPOFOL  N/A 01/19/2022   Procedure: ESOPHAGOGASTRODUODENOSCOPY (EGD) WITH PROPOFOL ;  Surgeon: Shaaron Andrea HERO, MD;  [facility];  Service: Endoscopy;  Laterality: N/A;  12:45 pm, pt knows to arrive at 9:15   ESOPHAGOGASTRODUODENOSCOPY (EGD) WITH PROPOFOL  N/A 09/25/2022   Procedure: ESOPHAGOGASTRODUODENOSCOPY (EGD) WITH PROPOFOL ;  Surgeon: Eartha Angelia Sieving, MD;  [facility];  Service: Gastroenterology;  Laterality:  N/A;   GIVENS CAPSULE STUDY N/A 06/16/2023   Procedure: IMAGING PROCEDURE, GI TRACT, INTRALUMINAL, VIA CAPSULE;  Surgeon: Cindie Carlin POUR, DO;  Location: [facility];  Service: Endoscopy;  Laterality: N/A;  730am   IR RADIOLOGIST EVAL & MGMT  04/28/2023   POLYPECTOMY  01/17/2018   Procedure: POLYPECTOMY;  Surgeon: Shaaron Andrea HERO, MD;  [facility];  Service: Endoscopy;;  colon   POLYPECTOMY  09/26/2022   Procedure: POLYPECTOMY;  Surgeon: Eartha Angelia Sieving, MD;  [facility];  Service: Gastroenterology;;   SKIN CANCER EXCISION  2007   TONSILLECTOMY  1965   ulner nerve  Left 06/2009   decompression    Family History  Problem Relation Age of Onset   Diabetes Mother    Brain cancer Mother    Diabetes Father    Heart attack Father    Heart disease Father    Diabetes Sister    Bipolar disorder Brother    Diabetes Brother    Heart disease Brother    Colon cancer Neg Hx    Breast cancer Neg Hx     Allergies as of 09/07/2023 - Review Complete 09/07/2023  Allergen Reaction Noted   Pramipexole Other (See Comments) 04/24/2015   Crestor [rosuvastatin calcium ]  03/28/2019   Bee pollen Other (See Comments) 04/24/2015   Pollen extract Other (See Comments) 04/24/2015    Social History   Socioeconomic History   Marital status: Divorced    Spouse name: Not on file   Number of children: 1   Years of education: 12   Highest education level: Not on file  Occupational History   Occupation: retired    Comment: employed at [employer]  Tobacco Use   Smoking status: Every Day    Current packs/day: 0.50    Average packs/day: 0.5 packs/day for 43.0  years (21.5 ttl pk-yrs)    Types: Cigarettes   Smokeless tobacco: Never   Tobacco comments:    smoking since 73yrs old.    Vaping Use   Vaping status: Never Used  Substance and Sexual Activity   Alcohol use: No    Alcohol/week: 0.0 standard drinks of alcohol   Drug use: No   Sexual activity: Not Currently  Other Topics Concern   Not on file  Social History Narrative   Lives alone.  Retired.  Education 12th grade.  One child.     Caffeine use- sodas, 2 daily   Social Drivers of Health   Financial Resource Strain: Low Risk  (11/10/2021)   Received from [facility]   Overall Financial Resource Strain (CARDIA)    Difficulty of Paying Living Expenses: Not hard at all  Food Insecurity: No Food Insecurity (09/25/2022)   Hunger Vital Sign    Worried About Running Out of Food in the Last Year: Never true    Ran Out of Food in the Last Year: Never true  Transportation Needs: No Transportation Needs (09/25/2022)   PRAPARE - Administrator, Civil Service (Medical): No    Lack of Transportation (Non-Medical): No  Physical Activity: Inactive (11/10/2021)   Received from [facility]   Exercise Vital Sign    On average, how many days per week do you engage in moderate to strenuous exercise (like a brisk walk)?: 0 days    On average, how many minutes do you engage in exercise at this level?: 0 min  Stress: No Stress Concern Present (11/10/2021)   Received from [facility]   [employer] of Occupational Health - Occupational Stress Questionnaire    Feeling of Stress : Not at all  Social Connections: Socially Isolated (11/10/2021)   Received from [facility]   Social Connection and Isolation Panel    In a typical week, how many times do you talk on the phone with family, friends, or neighbors?: More than three times a week    How often do you get together with friends or relatives?: Once a week    How often do you attend church or religious services?: Never     Do you belong to any clubs or organizations such as church groups, unions, fraternal or athletic groups, or school groups?: No    How often do you attend meetings of the clubs or organizations you belong to?: Never    Are you married, widowed, divorced, separated, never married, or living with a partner?: Divorced     Review of Systems   Gen: + fatigue, weakness. Denies fever, chills, anorexia. Denies weight loss.  CV: Denies chest pain, palpitations, syncope, peripheral edema, and claudication. Resp: Denies dyspnea at rest, cough, wheezing, coughing up blood, and pleurisy. GI: See HPI Derm: Denies rash, itching, dry skin Psych: + depression. Denies memory loss, confusion. No homicidal or suicidal ideation.  Heme: Denies bruising, bleeding, and enlarged lymph nodes.  Physical Exam   BP 125/72 (BP Location: Right Arm, Patient Position: Sitting, Cuff Size: Normal)   Pulse 72   Temp 98.1 F (36.7 C) (Temporal)   Ht 5' 6 (1.676 m)   Wt 165 lb 3.2 oz (74.9 kg)   BMI 26.66 kg/m   General:   Alert and oriented. No distress noted. Pleasant and cooperative.  Head:  Normocephalic and atraumatic. Eyes:  Conjuctiva clear without scleral icterus. Abdomen:  +BS, soft, distended, taut. Non tender.  No rebound or guarding. Rectal: deferred Msk:  Symmetrical without gross deformities. Normal posture. Sitting in wheelchair.  Extremities:  Without edema. Neurologic:  Alert and  oriented x4 Psych:  Alert and cooperative. Normal mood and affect.  Assessment & Plan   NAZANIN KINNER is a 73 y.o. female presenting today for cirrhosis follow up.     Cirrhosis with refractory ascites Cirrhosis with refractory ascites causing significant discomfort and fluid accumulation. She has been undergoing paracentesis every two weeks, removing 5L on average each procedure. Discussed potential for TIPS procedure to manage ascites and reduce fluid production. Risks include potential encephalopathy and no  guarantee of reduced fluid production. TIPS may also decrease the risk of bleeding from varices and help with anemia. Necessary imaging and echocardiogram for TIPS evaluation are complete. - Order labs to check hemoglobin, platelets, and kidney function - Contact Dr. Ebbie office to discuss TIPS procedure scheduling now that pre workup imaging is done.  - Continue paracentesis every two weeks - Consider increasing diuretics if kidney function allows - Cirrhosis lifestyle modifications reinforced today.  - US  in 6 months.   Malnutrition and muscle wasting secondary to liver disease Malnutrition and muscle wasting due to liver disease with decreased appetite and difficulty maintaining protein intake. Emphasized importance of adequate protein intake to maintain muscle mass and manage ascites. She has plant-based protein supplements and was advised on potential lactose-free options like Fairlife protein shakes. Emphasized the need for low-sodium diet to manage fluid retention. - Encourage daily protein supplement intake, preferably at night - Advise on low-sodium diet and rinsing canned vegetables to reduce sodium, prefer fresh or frozen options.  -  Discuss potential use of Fairlife protein shakes as a lactose-free option  Anemia Anemia potentially related to liver disease and possible gastrointestinal bleeding. Reports black stools occasional (incontinence), which may indicate bleeding from varices or gastropathy. No recent blood work to assess hemoglobin levels. Discussed possibility of repeating upper endoscopy if hemoglobin levels drop to evaluate for variceal bleeding or gastropathy. EGD and Colonoscopy in Sept 2024 and repeat EGD April 2025.   - Order labs to check hemoglobin levels and iron panel.  - Consider repeat upper endoscopy if hemoglobin levels drop  GERD GERD with symptoms of nausea and occasional vomiting. Uses Zofran  for nausea management. - Continue Zofran  as needed for nausea -  Continue omeprazole  40 mg once daily.   Lower extremity weakness and falls Lower extremity weakness leading to falls with episodes of leg numbness and weakness, not necessarily directly related to liver disease. Discussed potential causes including anemia and fluid retention. Advised to seek assistance with mobility aids and transportation. Emphasized importance of safety modifications at home to prevent falls. - Advised to discuss referral to social services  with PCP for assistance with mobility aids and transportation - Advise on use of rolling walker or wheelchair for safety - Encourage seeking assistance from social services for home safety modifications  Goals of Care Expressed interest in being placed on the liver transplant list as a long-term goal. Discussed MELD score and criteria for transplant eligibility. Current MELD score is 13, with potential for increase post-TIPS and further decompensation events. Discussed possibility of referral to a transplant center after TIPS procedure and lab results. Understands need for higher MELD score to impr ove transplant eligibility and is willing to pursue this path. - Order labs to update MELD score - Discuss potential referral to transplant center after TIPS procedure and lab results     Follow up    Follow up 3 months.     Charmaine Melia, MSN, FNP-BC, AGACNP-BC [facility]

## 2023-09-06 ENCOUNTER — Other Ambulatory Visit (HOSPITAL_COMMUNITY): Payer: Self-pay | Admitting: Gastroenterology

## 2023-09-06 DIAGNOSIS — R188 Other ascites: Secondary | ICD-10-CM

## 2023-09-07 ENCOUNTER — Ambulatory Visit (HOSPITAL_COMMUNITY): Admission: RE | Admit: 2023-09-07 | Source: Ambulatory Visit

## 2023-09-07 ENCOUNTER — Ambulatory Visit: Admitting: Gastroenterology

## 2023-09-07 ENCOUNTER — Encounter: Payer: Self-pay | Admitting: Gastroenterology

## 2023-09-07 VITALS — BP 125/72 | HR 72 | Temp 98.1°F | Ht 66.0 in | Wt 165.2 lb

## 2023-09-07 DIAGNOSIS — K769 Liver disease, unspecified: Secondary | ICD-10-CM

## 2023-09-07 DIAGNOSIS — D649 Anemia, unspecified: Secondary | ICD-10-CM

## 2023-09-07 DIAGNOSIS — R296 Repeated falls: Secondary | ICD-10-CM

## 2023-09-07 DIAGNOSIS — R188 Other ascites: Secondary | ICD-10-CM

## 2023-09-07 DIAGNOSIS — R5383 Other fatigue: Secondary | ICD-10-CM | POA: Diagnosis not present

## 2023-09-07 DIAGNOSIS — K219 Gastro-esophageal reflux disease without esophagitis: Secondary | ICD-10-CM

## 2023-09-07 DIAGNOSIS — K580 Irritable bowel syndrome with diarrhea: Secondary | ICD-10-CM

## 2023-09-07 DIAGNOSIS — K7460 Unspecified cirrhosis of liver: Secondary | ICD-10-CM | POA: Diagnosis not present

## 2023-09-07 NOTE — Patient Instructions (Addendum)
 VISIT SUMMARY: Today, we discussed your ongoing health issues related to liver cirrhosis, including leg weakness, falls, fluid retention, and poor appetite. We reviewed your recent tests and talked about potential treatments, including the TIPS procedure. We also addressed your nutritional needs, anemia, GERD, and mobility concerns. Lastly, we discussed your interest in being placed on the liver transplant list.  YOUR PLAN: -CIRRHOSIS WITH REFRACTORY ASCITES: Cirrhosis with refractory ascites means your liver disease is causing significant fluid buildup in your abdomen that is not responding well to treatment. We will continue with paracentesis every two weeks to remove the fluid and are considering a TIPS procedure to help manage the fluid buildup. We will also monitor your kidney function to see if we can safely increase your diuretics. For now continue current doses of lasix  and aldactone .   -MALNUTRITION AND MUSCLE WASTING SECONDARY TO LIVER DISEASE: Your liver disease is causing poor nutrition and muscle loss. It is important to maintain a high protein intake to help manage your condition. We recommend taking a daily protein supplement, preferably at night, and following a low-sodium diet. You might also consider lactose-free protein shakes like Fairlife.  Cirrhosis Lifestyle Recommendations:  High-protein diet from a primarily plant-based diet. Avoid red meat.  No raw or undercooked meat, seafood, or shellfish. Low-fat/cholesterol/carbohydrate diet. Limit sodium to no more than 2000 mg/day including everything that you eat and drink. Recommend at least 30 minutes of aerobic and resistance exercise 3 days/week. Limit Tylenol  to 2000 mg daily.   Let me know if you are experiencing confusion or hallucinations as we would need to consider something for encephalopathy at that time.   -ANEMIA: Anemia means you have a lower than normal number of red blood cells, which can be related to your liver  disease and possible gastrointestinal bleeding. We will check your hemoglobin levels and may consider an upper endoscopy if your levels drop to look for any bleeding. Please have labs done at [facility] tomorrow with your paracentesis. Please take lab slips with you.   -GERD: GERD is a condition where stomach acid frequently flows back into the tube connecting your mouth and stomach, causing nausea and vomiting. Continue using Zofran  as needed to manage your nausea. Continue omeprazole  40 mg daily.   -LOWER EXTREMITY WEAKNESS AND FALLS: Your leg weakness and falls may be related to anemia and fluid retention. We recommend using mobility aids like a rolling walker or wheelchair and seeking assistance from social services for home safety modifications and transportation. I want you to reach out to Dr. Jadene office to see if they can help with social work referral to get assistance on these things. You could also reach out to social services department in [place] to discuss as well.   -GOALS OF CARE: We discussed your interest in being placed on the liver transplant list. Your current MELD score is 13, and we will update it after the TIPS procedure and lab results. A higher MELD score close to 20 is when transplant referral is recommended. If requested I could still send you to hepatology referral for any further recommendations and conversations.   INSTRUCTIONS: I will be in touch with results of paracentesis and labs once I have had a chance to review.  Please call Dr. Ebbie office regarding the TIPS procedure and a follow up with him now that you have done CTA and Echo. Follow up in 3 months, sooner if needed.   Contains text generated by [employer].   It was  a pleasure to see you today. I want to create trusting relationships with patients. If you receive a survey regarding your visit,  I greatly appreciate you taking time to fill this out on paper or through your MyChart. I value your  feedback.  Charmaine Melia, MSN, FNP-BC, AGACNP-BC [facility]

## 2023-09-08 ENCOUNTER — Other Ambulatory Visit (HOSPITAL_COMMUNITY)
Admission: RE | Admit: 2023-09-08 | Discharge: 2023-09-08 | Disposition: A | Discharge status: Home / Self Care | Source: Ambulatory Visit | Attending: Gastroenterology | Admitting: Gastroenterology

## 2023-09-08 ENCOUNTER — Encounter (HOSPITAL_COMMUNITY): Payer: Self-pay

## 2023-09-08 ENCOUNTER — Ambulatory Visit: Payer: Self-pay | Admitting: Gastroenterology

## 2023-09-08 ENCOUNTER — Ambulatory Visit (HOSPITAL_COMMUNITY)
Admission: RE | Admit: 2023-09-08 | Discharge: 2023-09-08 | Disposition: A | Discharge status: Home / Self Care | Source: Ambulatory Visit | Attending: Gastroenterology | Admitting: Gastroenterology

## 2023-09-08 DIAGNOSIS — K7460 Unspecified cirrhosis of liver: Secondary | ICD-10-CM | POA: Diagnosis not present

## 2023-09-08 DIAGNOSIS — R188 Other ascites: Secondary | ICD-10-CM | POA: Diagnosis not present

## 2023-09-08 LAB — COMPREHENSIVE METABOLIC PANEL WITH GFR
ALT: 15 U/L (ref 0–44)
AST: 31 U/L (ref 15–41)
Albumin: 3.4 g/dL — ABNORMAL LOW (ref 3.5–5.0)
Alkaline Phosphatase: 147 U/L — ABNORMAL HIGH (ref 38–126)
Anion gap: 9 (ref 5–15)
BUN: 15 mg/dL (ref 8–23)
CO2: 22 mmol/L (ref 22–32)
Calcium: 8.7 mg/dL — ABNORMAL LOW (ref 8.9–10.3)
Chloride: 107 mmol/L (ref 98–111)
Creatinine, Ser: 1.11 mg/dL — ABNORMAL HIGH (ref 0.44–1.00)
GFR, Estimated: 53 mL/min — ABNORMAL LOW (ref >60)
Glucose, Bld: 114 mg/dL — ABNORMAL HIGH (ref 70–99)
Potassium: 4.7 mmol/L (ref 3.5–5.1)
Sodium: 138 mmol/L (ref 135–145)
Total Bilirubin: 1.4 mg/dL — ABNORMAL HIGH (ref 0.0–1.2)
Total Protein: 6.9 g/dL (ref 6.5–8.1)

## 2023-09-08 LAB — PROTIME-INR
INR: 1.2 (ref 0.8–1.2)
Prothrombin Time: 15.4 s — ABNORMAL HIGH (ref 11.4–15.2)

## 2023-09-08 LAB — IRON AND TIBC
Iron: 62 ug/dL (ref 28–170)
Saturation Ratios: 20 % (ref 10.4–31.8)
TIBC: 304 ug/dL (ref 250–450)
UIBC: 242 ug/dL

## 2023-09-08 LAB — CBC
HCT: 37.6 % (ref 36.0–46.0)
Hemoglobin: 11.7 g/dL — ABNORMAL LOW (ref 12.0–15.0)
MCH: 27.3 pg (ref 26.0–34.0)
MCHC: 31.1 g/dL (ref 30.0–36.0)
MCV: 87.9 fL (ref 80.0–100.0)
Platelets: 84 K/uL — ABNORMAL LOW (ref 150–400)
RBC: 4.28 MIL/uL (ref 3.87–5.11)
RDW: 16.6 % — ABNORMAL HIGH (ref 11.5–15.5)
WBC: 4.4 K/uL (ref 4.0–10.5)
nRBC: 0 % (ref 0.0–0.2)

## 2023-09-08 LAB — FERRITIN: Ferritin: 21 ng/mL (ref 11–307)

## 2023-09-08 MED ORDER — ALBUMIN HUMAN 25 % IV SOLN
INTRAVENOUS | Status: AC
  Filled 2023-09-08: qty 200

## 2023-09-08 MED ORDER — ALBUMIN HUMAN 25 % IV SOLN
50.0000 g | Freq: Once | INTRAVENOUS | Status: AC
  Administered 2023-09-08: 50 g via INTRAVENOUS

## 2023-09-08 NOTE — Procedures (Signed)
 PROCEDURE SUMMARY:  Successful ultrasound guided paracentesis from the right lower quadrant.  Yielded 5 L of clear yellow fluid.  No immediate complications.  The patient tolerated the procedure well.   Specimen not sent for labs.  EBL < 2 mL  The patient has previously been formally evaluated by the [facility] Interventional Radiology Portal Hypertension Clinic and is being actively followed for potential future intervention.  ** Patient has recently completed pre-procedure work up for TIPS.    Asser Lucena, AGACNP-BC 09/08/2023, 1:04 PM

## 2023-09-10 ENCOUNTER — Encounter: Payer: Self-pay | Admitting: Family Medicine

## 2023-09-13 DIAGNOSIS — E782 Mixed hyperlipidemia: Secondary | ICD-10-CM | POA: Diagnosis not present

## 2023-09-13 DIAGNOSIS — E1169 Type 2 diabetes mellitus with other specified complication: Secondary | ICD-10-CM | POA: Diagnosis not present

## 2023-09-20 ENCOUNTER — Other Ambulatory Visit: Payer: Self-pay | Admitting: Gastroenterology

## 2023-09-20 DIAGNOSIS — R188 Other ascites: Secondary | ICD-10-CM

## 2023-09-22 ENCOUNTER — Other Ambulatory Visit (HOSPITAL_COMMUNITY): Payer: Self-pay | Admitting: Diagnostic Radiology

## 2023-09-22 ENCOUNTER — Ambulatory Visit: Payer: Self-pay | Admitting: Gastroenterology

## 2023-09-22 ENCOUNTER — Telehealth (INDEPENDENT_AMBULATORY_CARE_PROVIDER_SITE_OTHER): Admitting: Psychiatry

## 2023-09-22 ENCOUNTER — Ambulatory Visit (HOSPITAL_COMMUNITY)
Admission: RE | Admit: 2023-09-22 | Discharge: 2023-09-22 | Disposition: A | Discharge status: Home / Self Care | Source: Ambulatory Visit | Attending: Gastroenterology | Admitting: Gastroenterology

## 2023-09-22 ENCOUNTER — Encounter (HOSPITAL_COMMUNITY): Payer: Self-pay | Admitting: Psychiatry

## 2023-09-22 ENCOUNTER — Encounter (HOSPITAL_COMMUNITY): Payer: Self-pay

## 2023-09-22 ENCOUNTER — Telehealth (HOSPITAL_COMMUNITY): Payer: Self-pay | Admitting: Radiology

## 2023-09-22 DIAGNOSIS — R251 Tremor, unspecified: Secondary | ICD-10-CM | POA: Diagnosis not present

## 2023-09-22 DIAGNOSIS — R188 Other ascites: Secondary | ICD-10-CM

## 2023-09-22 DIAGNOSIS — F3162 Bipolar disorder, current episode mixed, moderate: Secondary | ICD-10-CM | POA: Diagnosis not present

## 2023-09-22 DIAGNOSIS — K760 Fatty (change of) liver, not elsewhere classified: Secondary | ICD-10-CM

## 2023-09-22 DIAGNOSIS — M6250 Muscle wasting and atrophy, not elsewhere classified, unspecified site: Secondary | ICD-10-CM

## 2023-09-22 DIAGNOSIS — K7460 Unspecified cirrhosis of liver: Secondary | ICD-10-CM | POA: Diagnosis not present

## 2023-09-22 LAB — BODY FLUID CELL COUNT WITH DIFFERENTIAL
Eos, Fluid: 0 %
Lymphs, Fluid: 58 %
Monocyte-Macrophage-Serous Fluid: 35 % — ABNORMAL LOW (ref 50–90)
Neutrophil Count, Fluid: 7 % (ref 0–25)
Total Nucleated Cell Count, Fluid: 309 uL (ref 0–1000)

## 2023-09-22 LAB — GRAM STAIN

## 2023-09-22 MED ORDER — ALBUMIN HUMAN 25 % IV SOLN
INTRAVENOUS | Status: AC
  Filled 2023-09-22: qty 100

## 2023-09-22 MED ORDER — ALBUMIN HUMAN 25 % IV SOLN
50.0000 g | Freq: Once | INTRAVENOUS | Status: AC
  Administered 2023-09-22: 50 g via INTRAVENOUS

## 2023-09-22 NOTE — Progress Notes (Signed)
 Patient tolerated Left sided Paracentesis procedure and 50G of IV albumin  well today and 5 Liters of clear yellow ascites removed with labs collected and sent for processing. Patient verbalized understanding of discharge instructions and left with son via wheelchair with no acute distress noted.

## 2023-09-22 NOTE — Telephone Encounter (Signed)
 Called pt to schedule TIPS procedure with Dr. Philip. Left VM for her to call back to schedule. JM

## 2023-09-22 NOTE — Progress Notes (Signed)
 Virtual Visit via Telephone Note  I connected with Andrea Dickson on 09/22/23 at  1:20 PM EDT by telephone and verified that I am speaking with the correct person using two identifiers.  Location: Patient: home Provider: office   I discussed the limitations, risks, security and privacy concerns of performing an evaluation and management service by telephone and the availability of in person appointments. I also discussed with the patient that there may be a patient responsible charge related to this service. The patient expressed understanding and agreed to proceed.      I discussed the assessment and treatment plan with the patient. The patient was provided an opportunity to ask questions and all were answered. The patient agreed with the plan and demonstrated an understanding of the instructions.   The patient was advised to call back or seek an in-person evaluation if the symptoms worsen or if the condition fails to improve as anticipated.  I provided 20 minutes of non-face-to-face time during this encounter.   Barnie Gull, MD  [facility] MD/PA/NP OP Progress Note  09/22/2023 1:35 PM Andrea Dickson  MRN:  [id number]  Chief Complaint:  Chief Complaint  Patient presents with   Anxiety   Depression   Follow-up   HPI: This patient is a 72 year old separated white female who lives alone in [place]. She has 1 son and 3 grandchildren. She is retired from [employer]. She has a history of depression anxiety and possible bipolar disorder   The patient returns for follow-up after 4 weeks regarding depression anxiety and tremor.  She is still dealing with idiopathic liver cirrhosis and having to have amniocentesis every 2 weeks.  She has decided to undergo a TIPS procedure.  She really feels like she cannot go on like she has.  She is now having malnutrition and muscle wasting from her liver disease and is no longer able to drive.  She is trying to increase her protein.  Last time I  sent in mirtazapine  to help with her appetite and sleep but she never received it from the pharmacy.  It is a Recruitment consultant and she thinks it is going to come in the next delivery.  For now she is sleeping a little better and still using the Valium .  She denies being significantly depressed or anxious and her tremor is improved by propranolol  Visit Diagnosis:    ICD-10-CM   1. Bipolar 1 disorder, mixed, moderate (HCC)  F31.62       Past Psychiatric History: Past outpatient treatment  Past Medical History:  Past Medical History:  Diagnosis Date   Anxiety    Asthma    Bipolar 1 disorder (HCC)    Chronic diarrhea    Cirrhosis (HCC)    Diagnosed September 2020, likely secondary to [facility], s/p Hep A/B vaccination in 2021.    Depression    Diabetes (HCC)    Essential tremor    GERD (gastroesophageal reflux disease)    Headache    High cholesterol     Past Surgical History:  Procedure Laterality Date   ABDOMINAL HYSTERECTOMY  1997   BIOPSY  01/17/2018   Procedure: BIOPSY;  Surgeon: Shaaron Lamar HERO, MD;  [facility];  Service: Endoscopy;;  colon   carpal tunnel Bilateral 1999, 06/2009   COLONOSCOPY  2013   Dr. Donnel: no colon polyps. quality of prep was fair. repeat colonoscopy in five years.    COLONOSCOPY WITH PROPOFOL  N/A 01/17/2018   Dr. Shaaron: Colonic lipoma, 2  tubular adenomas removed.  Random colon biopsies negative.  Next colonoscopy 5 years.   COLONOSCOPY WITH PROPOFOL  N/A 09/26/2022   Procedure: COLONOSCOPY WITH PROPOFOL ;  Surgeon: Eartha Angelia Sieving, MD;  [facility];  Service: Gastroenterology;  Laterality: N/A;   ESOPHAGOGASTRODUODENOSCOPY N/A 04/19/2023   Procedure: EGD (ESOPHAGOGASTRODUODENOSCOPY);  Surgeon: Cindie Carlin POUR, DO;  [facility];  Service: Endoscopy;  Laterality: N/A;  1:45 pm, asa 3   ESOPHAGOGASTRODUODENOSCOPY (EGD) WITH PROPOFOL  N/A 04/06/2019   Procedure: ESOPHAGOGASTRODUODENOSCOPY (EGD) WITH PROPOFOL ;  Surgeon:  Shaaron Lamar HERO, MD;   Normal esophagus, mild portal hypertensive gastropathy, otherwise normal exam.  Recommend repeat EGD in 2 years for variceal screening.    ESOPHAGOGASTRODUODENOSCOPY (EGD) WITH PROPOFOL  N/A 01/19/2022   Procedure: ESOPHAGOGASTRODUODENOSCOPY (EGD) WITH PROPOFOL ;  Surgeon: Shaaron Lamar HERO, MD;  [facility];  Service: Endoscopy;  Laterality: N/A;  12:45 pm, pt knows to arrive at 9:15   ESOPHAGOGASTRODUODENOSCOPY (EGD) WITH PROPOFOL  N/A 09/25/2022   Procedure: ESOPHAGOGASTRODUODENOSCOPY (EGD) WITH PROPOFOL ;  Surgeon: Eartha Angelia Sieving, MD;  [facility];  Service: Gastroenterology;  Laterality: N/A;   GIVENS CAPSULE STUDY N/A 06/16/2023   Procedure: IMAGING PROCEDURE, GI TRACT, INTRALUMINAL, VIA CAPSULE;  Surgeon: Cindie Carlin POUR, DO;  [facility];  Service: Endoscopy;  Laterality: N/A;  730am   IR RADIOLOGIST EVAL & MGMT  04/28/2023   POLYPECTOMY  01/17/2018   Procedure: POLYPECTOMY;  Surgeon: Shaaron Lamar HERO, MD;  [facility];  Service: Endoscopy;;  colon   POLYPECTOMY  09/26/2022   Procedure: POLYPECTOMY;  Surgeon: Eartha Angelia Sieving, MD;  [facility];  Service: Gastroenterology;;   SKIN CANCER EXCISION  2007   TONSILLECTOMY  1965   ulner nerve  Left 06/2009   decompression    Family Psychiatric History: See below  Family History:  Family History  Problem Relation Age of Onset   Diabetes Mother    Brain cancer Mother    Diabetes Father    Heart attack Father    Heart disease Father    Diabetes Sister    Bipolar disorder Brother    Diabetes Brother    Heart disease Brother    Colon cancer Neg Hx    Breast cancer Neg Hx     Social History:  Social History   Socioeconomic History   Marital status: Divorced    Spouse name: Not on file   Number of children: 1   Years of education: 12   Highest education level: Not on file  Occupational History   Occupation: retired    Comment: employed  at [employer]  Tobacco Use   Smoking status: Every Day    Current packs/day: 0.50    Average packs/day: 0.5 packs/day for 43.0 years (21.5 ttl pk-yrs)    Types: Cigarettes   Smokeless tobacco: Never   Tobacco comments:    smoking since 73yrs old.    Vaping Use   Vaping status: Never Used  Substance and Sexual Activity   Alcohol use: No    Alcohol/week: 0.0 standard drinks of alcohol   Drug use: No   Sexual activity: Not Currently  Other Topics Concern   Not on file  Social History Narrative   Lives alone.  Retired.  Education 12th grade.  One child.     Caffeine use- sodas, 2 daily   Social Drivers of Health   Financial Resource Strain: Low Risk  (11/10/2021)   Received from [facility]  Overall Financial Resource Strain (CARDIA)    Difficulty of Paying Living Expenses: Not hard at all  Food Insecurity: No Food Insecurity (09/25/2022)   Hunger Vital Sign    Worried About Running Out of Food in the Last Year: Never true    Ran Out of Food in the Last Year: Never true  Transportation Needs: No Transportation Needs (09/25/2022)   PRAPARE - Administrator, Civil Service (Medical): No    Lack of Transportation (Non-Medical): No  Physical Activity: Inactive (11/10/2021)   Received from [facility]   Exercise Vital Sign    On average, how many days per week do you engage in moderate to strenuous exercise (like a brisk walk)?: 0 days    On average, how many minutes do you engage in exercise at this level?: 0 min  Stress: No Stress Concern Present (11/10/2021)   Received from [facility] - Occupational Stress Questionnaire    Feeling of Stress : Not at all  Social Connections: Socially Isolated (11/10/2021)   Received from [facility]   Social Connection and Isolation Panel    In a typical week, how many times do you talk on the phone with family, friends, or neighbors?: More than three times a week     How often do you get together with friends or relatives?: Once a week    How often do you attend church or religious services?: Never    Do you belong to any clubs or organizations such as church groups, unions, fraternal or athletic groups, or school groups?: No    How often do you attend meetings of the clubs or organizations you belong to?: Never    Are you married, widowed, divorced, separated, never married, or living with a partner?: Divorced    Allergies:  Allergies  Allergen Reactions   Pramipexole Other (See Comments)    Shaking, palpitations, headache, faint feeling   Crestor [Rosuvastatin Calcium ]     Muscle pain   Bee Pollen Other (See Comments)    Eyes and nose run   Pollen Extract Other (See Comments)    Eyes and nose run    Metabolic Disorder Labs: Lab Results  Component Value Date   HGBA1C 5.4 04/24/2015   MPG 108 04/24/2015   No results found for: PROLACTIN Lab Results  Component Value Date   CHOL 178 02/17/2016   TRIG 229 (H) 02/17/2016   HDL 30 (L) 02/17/2016   CHOLHDL 5.9 (H) 02/17/2016   VLDL 46 (H) 02/17/2016   LDLCALC 102 (H) 02/17/2016   LDLCALC 126 (H) 11/18/2015   Lab Results  Component Value Date   TSH 1.00 12/24/2021   TSH 2.96 08/29/2019    Therapeutic Level Labs: Lab Results  Component Value Date   LITHIUM  0.8 08/29/2019   LITHIUM  0.4 (L) 10/13/2018   Lab Results  Component Value Date   VALPROATE 62.2 08/20/2016   VALPROATE 83.0 10/14/2015   No results found for: CBMZ  Current Medications: Current Outpatient Medications  Medication Sig Dispense Refill   acetaminophen  (TYLENOL ) 500 MG tablet Take 1,000 mg by mouth 2 (two) times daily as needed for moderate pain or headache.     buPROPion  (WELLBUTRIN  XL) 150 MG 24 hr tablet Take 1 tablet (150 mg total) by mouth every morning. 90 tablet 2   cetirizine (ZYRTEC) 10 MG tablet Take 10 mg by mouth daily. Aller-Tec     Cholecalciferol (VITAMIN D3) 50  MCG (2000 UT) TABS Take 2,000  Units by mouth daily.      diazepam  (VALIUM ) 2 MG tablet Take 1 tablet (2 mg total) by mouth daily as needed for anxiety. 90 tablet 2   estrogens , conjugated, (PREMARIN ) 1.25 MG tablet Take 1.25 mg by mouth daily.      ezetimibe (ZETIA) 10 MG tablet Take 10 mg by mouth daily.     fluticasone (FLONASE) 50 MCG/ACT nasal spray Place 2 sprays into both nostrils daily.     furosemide  (LASIX ) 40 MG tablet Take 1 tablet (40 mg total) by mouth daily. 30 tablet 11   hyoscyamine  (LEVSIN  SL) 0.125 MG SL tablet Place 1 tablet (0.125 mg total) under the tongue every 6 (six) hours as needed for cramping. 30 tablet 1   Lactobacillus-Inulin (CULTURELLE DIGESTIVE HEALTH) CAPS Take 1 capsule by mouth daily.     mirtazapine  (REMERON ) 15 MG tablet Take 1 tablet (15 mg total) by mouth at bedtime. 30 tablet 2   omeprazole  (PRILOSEC) 40 MG capsule TAKE ONE (1) CAPSULE BY MOUTH TWICE DAILY AT BREAKFAST AND AT BEDTIME 60 capsule 11   ondansetron  (ZOFRAN ) 4 MG tablet TAKE 1 TABLET BY MOUTH EVERY 8 HOURS AS NEEDED FOR NAUSEA & VOMITING 30 tablet 10   Pancrelipase , Lip-Prot-Amyl, (ZENPEP ) 60000-189600 units CPEP Take 1 capsule by mouth 3 (three) times daily with meals. 100 capsule 11   propranolol  ER (INDERAL  LA) 80 MG 24 hr capsule Take 1 capsule (80 mg total) by mouth daily. 90 capsule 2   spironolactone  (ALDACTONE ) 100 MG tablet Take 1 tablet (100 mg total) by mouth daily. 30 tablet 11   trimethoprim  (TRIMPEX ) 100 MG tablet TAKE 1 TABLET BY MOUTH EVERY DAY AT BEDTIME 289 tablet 11   No current facility-administered medications for this visit.     Musculoskeletal: Strength & Muscle Tone: na Gait & Station: na Patient leans: N/A  Psychiatric Specialty Exam: Review of Systems  Constitutional:  Positive for appetite change.  Gastrointestinal:  Positive for abdominal distention.  Neurological:  Positive for tremors and weakness.  All other systems reviewed and are negative.   There were no vitals taken for this  visit.There is no height or weight on file to calculate BMI.  General Appearance: NA  Eye Contact:  NA  Speech:  Clear and Coherent  Volume:  Normal  Mood:  Euthymic  Affect:  NA  Thought Process:  Goal Directed  Orientation:  Full (Time, Place, and Person)  Thought Content: Rumination   Suicidal Thoughts:  No  Homicidal Thoughts:  No  Memory:  Immediate;   Good Recent;   Good Remote;   Good  Judgement:  Good  Insight:  Fair  Psychomotor Activity:  Decreased  Concentration:  Concentration: Good and Attention Span: Good  Recall:  Good  Fund of Knowledge: Good  Language: Good  Akathisia:  No  Handed:  Right  AIMS (if indicated): not done  Assets:  Communication Skills Desire for Improvement Resilience Social Support  ADL's:  Intact  Cognition: WNL  Sleep:  Good   Screenings: PHQ2-9    Flowsheet Row Video Visit from 09/30/2021 in [facility] [place] Video Visit from 06/26/2021 in [facility] [place] Video Visit from 03/26/2021 in [facility] at [place] Video Visit from 12/18/2020 in [facility] at [place] Video Visit from 09/24/2020 in [facility] Total Score 0 0 0  0 0   Flowsheet Row Admission (Discharged) from 04/19/2023 in [facility] to Hosp-Admission (Discharged) from 09/24/2022 in [facility] SURGICAL UNIT ED from 07/25/2022 in [facility] Department [facility]  C-SSRS RISK CATEGORY No Risk No Risk No Risk     Assessment and Plan: This patient is a 73 year old female with a history of bipolar disorder anxiety tremor nonalcoholic liver disease with associated ascites varices and muscle wasting.  She does have a lot of difficult medical issues but she has decided to go ahead with the TIPS procedure and she seems to be at peace with this.  After that she may be  referred for liver transplant.  For now she will continue mirtazapine  15 mg at bedtime which she will hopefully receive in her next delivery, Valium  2 mg once daily as needed for anxiety, Wellbutrin  XL 150 mg daily for depression and Inderal  LA 80 mg every morning for tremor.  She will return to see me in 6 weeks  Collaboration of Care: Collaboration of Care: Other provider involved in patient's care AEB notes are shared with GI on the epic system  Patient/Guardian was advised Release of Information must be obtained prior to any record release in order to collaborate their care with an outside provider. Patient/Guardian was advised if they have not already done so to contact the registration department to sign all necessary forms in order for us  to release information regarding their care.   Consent: Patient/Guardian gives verbal consent for treatment and assignment of benefits for services provided during this visit. Patient/Guardian expressed understanding and agreed to proceed.    Barnie Gull, MD 09/22/2023, 1:35 PM

## 2023-09-22 NOTE — Procedures (Signed)
 PROCEDURE SUMMARY:  Successful ultrasound guided paracentesis from the left lower quadrant.  Yielded 5 L of clear yellow fluid.  No immediate complications.  The patient tolerated the procedure well.   Specimen not sent for labs.  EBL < 2 mL  The patient has previously been formally evaluated by the [facility] Interventional Radiology Portal Hypertension Clinic and is being actively followed for potential future intervention.  ** Patient has recently completed pre-procedure work up for TIPS. Patient states she is scheduled for TIPS in October 2025.    Weslee Fogg, AGACNP-BC 09/22/2023, 12:27 PM

## 2023-09-23 LAB — PATHOLOGIST SMEAR REVIEW

## 2023-09-25 DIAGNOSIS — Z888 Allergy status to other drugs, medicaments and biological substances status: Secondary | ICD-10-CM | POA: Diagnosis not present

## 2023-09-25 DIAGNOSIS — F17210 Nicotine dependence, cigarettes, uncomplicated: Secondary | ICD-10-CM | POA: Diagnosis not present

## 2023-09-25 DIAGNOSIS — S2241XA Multiple fractures of ribs, right side, initial encounter for closed fracture: Secondary | ICD-10-CM | POA: Diagnosis not present

## 2023-09-25 DIAGNOSIS — R161 Splenomegaly, not elsewhere classified: Secondary | ICD-10-CM | POA: Diagnosis not present

## 2023-09-25 DIAGNOSIS — Z91030 Bee allergy status: Secondary | ICD-10-CM | POA: Diagnosis not present

## 2023-09-25 DIAGNOSIS — W19XXXA Unspecified fall, initial encounter: Secondary | ICD-10-CM | POA: Diagnosis not present

## 2023-09-25 DIAGNOSIS — W1839XA Other fall on same level, initial encounter: Secondary | ICD-10-CM | POA: Diagnosis not present

## 2023-09-25 DIAGNOSIS — K7460 Unspecified cirrhosis of liver: Secondary | ICD-10-CM | POA: Diagnosis not present

## 2023-09-25 DIAGNOSIS — R188 Other ascites: Secondary | ICD-10-CM | POA: Diagnosis not present

## 2023-09-27 LAB — CULTURE, BODY FLUID W GRAM STAIN -BOTTLE
Culture: NO GROWTH
Special Requests: ADEQUATE

## 2023-09-29 ENCOUNTER — Other Ambulatory Visit (HOSPITAL_COMMUNITY)

## 2023-09-30 DIAGNOSIS — Z23 Encounter for immunization: Secondary | ICD-10-CM | POA: Diagnosis not present

## 2023-09-30 DIAGNOSIS — I10 Essential (primary) hypertension: Secondary | ICD-10-CM | POA: Diagnosis not present

## 2023-09-30 DIAGNOSIS — Z9181 History of falling: Secondary | ICD-10-CM | POA: Diagnosis not present

## 2023-09-30 DIAGNOSIS — E1169 Type 2 diabetes mellitus with other specified complication: Secondary | ICD-10-CM | POA: Diagnosis not present

## 2023-09-30 DIAGNOSIS — N1832 Chronic kidney disease, stage 3b: Secondary | ICD-10-CM | POA: Diagnosis not present

## 2023-10-01 DIAGNOSIS — K219 Gastro-esophageal reflux disease without esophagitis: Secondary | ICD-10-CM | POA: Diagnosis not present

## 2023-10-01 DIAGNOSIS — K7460 Unspecified cirrhosis of liver: Secondary | ICD-10-CM | POA: Diagnosis not present

## 2023-10-01 DIAGNOSIS — R251 Tremor, unspecified: Secondary | ICD-10-CM | POA: Diagnosis not present

## 2023-10-01 DIAGNOSIS — G8929 Other chronic pain: Secondary | ICD-10-CM | POA: Diagnosis not present

## 2023-10-01 DIAGNOSIS — E118 Type 2 diabetes mellitus with unspecified complications: Secondary | ICD-10-CM | POA: Diagnosis not present

## 2023-10-06 ENCOUNTER — Other Ambulatory Visit: Payer: Self-pay | Admitting: Gastroenterology

## 2023-10-06 ENCOUNTER — Ambulatory Visit: Payer: Self-pay | Admitting: Gastroenterology

## 2023-10-06 ENCOUNTER — Ambulatory Visit (HOSPITAL_COMMUNITY)
Admission: RE | Admit: 2023-10-06 | Discharge: 2023-10-06 | Disposition: A | Discharge status: Home / Self Care | Source: Ambulatory Visit | Attending: Gastroenterology | Admitting: Gastroenterology

## 2023-10-06 DIAGNOSIS — K7460 Unspecified cirrhosis of liver: Secondary | ICD-10-CM | POA: Insufficient documentation

## 2023-10-06 DIAGNOSIS — R188 Other ascites: Secondary | ICD-10-CM | POA: Diagnosis not present

## 2023-10-06 LAB — GRAM STAIN

## 2023-10-06 LAB — BODY FLUID CELL COUNT WITH DIFFERENTIAL
Lymphs, Fluid: 60 %
Monocyte-Macrophage-Serous Fluid: 22 % — ABNORMAL LOW (ref 50–90)
Neutrophil Count, Fluid: 18 % (ref 0–25)
Total Nucleated Cell Count, Fluid: 204 uL (ref 0–1000)

## 2023-10-06 NOTE — Procedures (Signed)
 PROCEDURE SUMMARY:  Successful ultrasound guided paracentesis from the left lower quadrant.  Yielded 4.8 L of clear yellow fluid.  No immediate complications.  The patient tolerated the procedure well.   Specimen sent for labs.  EBL < 2 mL  The patient has previously been formally evaluated by the [facility] Interventional Radiology Portal Hypertension Clinic and is being actively followed for potential future intervention.  **  Patient is scheduled for TIPS 11/19/23.    Mattison Golay, AGACNP-BC 10/06/2023, 12:15 PM

## 2023-10-07 ENCOUNTER — Other Ambulatory Visit (HOSPITAL_COMMUNITY): Payer: Self-pay | Admitting: Psychiatry

## 2023-10-07 MED ORDER — BUPROPION HCL ER (XL) 150 MG PO TB24: 150.0000 mg | ORAL_TABLET | ORAL | 2 refills | Status: DC

## 2023-10-07 MED ORDER — DIAZEPAM 2 MG PO TABS: 2.0000 mg | ORAL_TABLET | Freq: Every day | ORAL | 2 refills | Status: DC | PRN

## 2023-10-07 MED ORDER — PROPRANOLOL HCL ER 80 MG PO CP24: 80.0000 mg | ORAL_CAPSULE | Freq: Every day | ORAL | 2 refills | Status: DC

## 2023-10-07 MED ORDER — MIRTAZAPINE 15 MG PO TABS: 15.0000 mg | ORAL_TABLET | Freq: Every day | ORAL | 2 refills | Status: DC

## 2023-10-11 DIAGNOSIS — E1169 Type 2 diabetes mellitus with other specified complication: Secondary | ICD-10-CM | POA: Diagnosis not present

## 2023-10-11 DIAGNOSIS — R251 Tremor, unspecified: Secondary | ICD-10-CM | POA: Diagnosis not present

## 2023-10-11 DIAGNOSIS — H6122 Impacted cerumen, left ear: Secondary | ICD-10-CM | POA: Diagnosis not present

## 2023-10-11 DIAGNOSIS — N390 Urinary tract infection, site not specified: Secondary | ICD-10-CM | POA: Diagnosis not present

## 2023-10-11 DIAGNOSIS — R296 Repeated falls: Secondary | ICD-10-CM | POA: Diagnosis not present

## 2023-10-11 DIAGNOSIS — E7849 Other hyperlipidemia: Secondary | ICD-10-CM | POA: Diagnosis not present

## 2023-10-11 DIAGNOSIS — I10 Essential (primary) hypertension: Secondary | ICD-10-CM | POA: Diagnosis not present

## 2023-10-11 DIAGNOSIS — D649 Anemia, unspecified: Secondary | ICD-10-CM | POA: Diagnosis not present

## 2023-10-11 DIAGNOSIS — K7460 Unspecified cirrhosis of liver: Secondary | ICD-10-CM | POA: Diagnosis not present

## 2023-10-11 DIAGNOSIS — I8500 Esophageal varices without bleeding: Secondary | ICD-10-CM | POA: Diagnosis not present

## 2023-10-11 DIAGNOSIS — J309 Allergic rhinitis, unspecified: Secondary | ICD-10-CM | POA: Diagnosis not present

## 2023-10-11 LAB — CULTURE, BODY FLUID W GRAM STAIN -BOTTLE
Culture: NO GROWTH
Special Requests: ADEQUATE

## 2023-10-13 ENCOUNTER — Telehealth: Payer: Self-pay | Admitting: *Deleted

## 2023-10-13 DIAGNOSIS — S2241XD Multiple fractures of ribs, right side, subsequent encounter for fracture with routine healing: Secondary | ICD-10-CM | POA: Diagnosis not present

## 2023-10-13 NOTE — Telephone Encounter (Signed)
 Spoke to nurse Jenna at [facility]) informed her of recommendations. She voiced understanding.

## 2023-10-13 NOTE — Telephone Encounter (Signed)
 Nurse from [facility] called for Andrea Burow, NP wanting to know if pt could take Zetia 10 ml, because of her cirrhosis. Please advise.

## 2023-10-15 DIAGNOSIS — R262 Difficulty in walking, not elsewhere classified: Secondary | ICD-10-CM | POA: Diagnosis not present

## 2023-10-15 DIAGNOSIS — Z9181 History of falling: Secondary | ICD-10-CM | POA: Diagnosis not present

## 2023-10-15 DIAGNOSIS — R296 Repeated falls: Secondary | ICD-10-CM | POA: Diagnosis not present

## 2023-10-15 DIAGNOSIS — D649 Anemia, unspecified: Secondary | ICD-10-CM | POA: Diagnosis not present

## 2023-10-15 DIAGNOSIS — I10 Essential (primary) hypertension: Secondary | ICD-10-CM | POA: Diagnosis not present

## 2023-10-20 ENCOUNTER — Ambulatory Visit: Payer: Self-pay | Admitting: Gastroenterology

## 2023-10-20 ENCOUNTER — Encounter (HOSPITAL_COMMUNITY): Payer: Self-pay

## 2023-10-20 ENCOUNTER — Ambulatory Visit (HOSPITAL_COMMUNITY)
Admission: RE | Admit: 2023-10-20 | Discharge: 2023-10-20 | Disposition: A | Discharge status: Home / Self Care | Source: Ambulatory Visit | Attending: Gastroenterology | Admitting: Gastroenterology

## 2023-10-20 DIAGNOSIS — R188 Other ascites: Secondary | ICD-10-CM | POA: Insufficient documentation

## 2023-10-20 DIAGNOSIS — K7460 Unspecified cirrhosis of liver: Secondary | ICD-10-CM | POA: Diagnosis not present

## 2023-10-20 LAB — BODY FLUID CELL COUNT WITH DIFFERENTIAL
Lymphs, Fluid: 38 %
Monocyte-Macrophage-Serous Fluid: 53 % (ref 50–90)
Neutrophil Count, Fluid: 9 % (ref 0–25)
Total Nucleated Cell Count, Fluid: 214 uL (ref 0–1000)

## 2023-10-20 LAB — GRAM STAIN

## 2023-10-20 MED ORDER — ALBUMIN HUMAN 25 % IV SOLN
INTRAVENOUS | Status: AC
  Filled 2023-10-20: qty 200

## 2023-10-20 MED ORDER — ALBUMIN HUMAN 25 % IV SOLN
50.0000 g | Freq: Once | INTRAVENOUS | Status: AC
Start: 2023-10-20 — End: 2023-10-20
  Administered 2023-10-20: 50 g via INTRAVENOUS

## 2023-10-20 NOTE — Procedures (Signed)
 PROCEDURE SUMMARY:  Successful ultrasound guided paracentesis from the left lower quadrant.  Yielded 5 L of clear yellow fluid.  No immediate complications.  The patient tolerated the procedure well.   Specimen sent for labs.  EBL < 2 mL  The patient has previously been formally evaluated by the [facility] Interventional Radiology Portal Hypertension Clinic and is being actively followed for potential future intervention.  **  Patient is scheduled for TIPS 11/19/23.    Warren Dais, AGACNP-BC 10/20/2023, 12:39 PM

## 2023-10-20 NOTE — Progress Notes (Signed)
 Patient tolerated left sided Paracentesis and 50G of IV albumin  well today and 5 Liters of clear yellow ascites removed with labs collected and sent to lab for processing. Patient verbalized understanding of discharge instructions and left via wheelchair with no acute distress noted.

## 2023-10-25 ENCOUNTER — Ambulatory Visit (HOSPITAL_COMMUNITY)

## 2023-10-25 LAB — CULTURE, BODY FLUID W GRAM STAIN -BOTTLE
Culture: NO GROWTH
Special Requests: ADEQUATE

## 2023-10-27 ENCOUNTER — Encounter (INDEPENDENT_AMBULATORY_CARE_PROVIDER_SITE_OTHER): Payer: Self-pay | Admitting: Gastroenterology

## 2023-10-28 DIAGNOSIS — G8929 Other chronic pain: Secondary | ICD-10-CM | POA: Diagnosis not present

## 2023-11-01 ENCOUNTER — Other Ambulatory Visit: Payer: Self-pay | Admitting: Gastroenterology

## 2023-11-01 DIAGNOSIS — R188 Other ascites: Secondary | ICD-10-CM

## 2023-11-02 ENCOUNTER — Encounter: Payer: Self-pay | Admitting: Gastroenterology

## 2023-11-03 ENCOUNTER — Encounter (HOSPITAL_COMMUNITY): Payer: Self-pay

## 2023-11-03 ENCOUNTER — Ambulatory Visit (HOSPITAL_COMMUNITY)
Admission: RE | Admit: 2023-11-03 | Discharge: 2023-11-03 | Disposition: A | Discharge status: Home / Self Care | Source: Ambulatory Visit | Attending: Gastroenterology | Admitting: Gastroenterology

## 2023-11-03 ENCOUNTER — Ambulatory Visit: Payer: Self-pay | Admitting: Gastroenterology

## 2023-11-03 ENCOUNTER — Telehealth (INDEPENDENT_AMBULATORY_CARE_PROVIDER_SITE_OTHER): Admitting: Psychiatry

## 2023-11-03 ENCOUNTER — Encounter (HOSPITAL_COMMUNITY): Payer: Self-pay | Admitting: Psychiatry

## 2023-11-03 DIAGNOSIS — R251 Tremor, unspecified: Secondary | ICD-10-CM

## 2023-11-03 DIAGNOSIS — G251 Drug-induced tremor: Secondary | ICD-10-CM

## 2023-11-03 DIAGNOSIS — F3162 Bipolar disorder, current episode mixed, moderate: Secondary | ICD-10-CM

## 2023-11-03 DIAGNOSIS — K7689 Other specified diseases of liver: Secondary | ICD-10-CM

## 2023-11-03 DIAGNOSIS — F319 Bipolar disorder, unspecified: Secondary | ICD-10-CM

## 2023-11-03 DIAGNOSIS — R188 Other ascites: Secondary | ICD-10-CM | POA: Diagnosis not present

## 2023-11-03 DIAGNOSIS — F411 Generalized anxiety disorder: Secondary | ICD-10-CM

## 2023-11-03 DIAGNOSIS — K7460 Unspecified cirrhosis of liver: Secondary | ICD-10-CM | POA: Insufficient documentation

## 2023-11-03 LAB — GRAM STAIN: Gram Stain: NONE SEEN

## 2023-11-03 LAB — BODY FLUID CELL COUNT WITH DIFFERENTIAL
Lymphs, Fluid: 70 %
Monocyte-Macrophage-Serous Fluid: 16 % — ABNORMAL LOW (ref 50–90)
Neutrophil Count, Fluid: 14 % (ref 0–25)
Total Nucleated Cell Count, Fluid: 201 uL (ref 0–1000)

## 2023-11-03 MED ORDER — BUPROPION HCL ER (XL) 150 MG PO TB24: 150.0000 mg | ORAL_TABLET | ORAL | 2 refills | Status: DC

## 2023-11-03 MED ORDER — DIAZEPAM 2 MG PO TABS: 2.0000 mg | ORAL_TABLET | Freq: Every day | ORAL | 2 refills | Status: DC | PRN

## 2023-11-03 MED ORDER — ALBUMIN HUMAN 25 % IV SOLN
INTRAVENOUS | Status: AC
  Filled 2023-11-03: qty 200

## 2023-11-03 MED ORDER — ALBUMIN HUMAN 25 % IV SOLN
50.0000 g | Freq: Once | INTRAVENOUS | Status: AC
  Administered 2023-11-03: 50 g via INTRAVENOUS

## 2023-11-03 MED ORDER — PROPRANOLOL HCL ER 80 MG PO CP24: 80.0000 mg | ORAL_CAPSULE | Freq: Every day | ORAL | 2 refills | Status: DC

## 2023-11-03 MED ORDER — MIRTAZAPINE 15 MG PO TABS: 15.0000 mg | ORAL_TABLET | Freq: Every day | ORAL | 2 refills | Status: DC

## 2023-11-03 NOTE — Progress Notes (Signed)
 Patient tolerated right sided Paracentesis and 50G of IV albumin  well today. 5 Liters of clear yellow ascites removed with lab collected and sent for processing. Patient verbalized understanding of discharge instructions and left via wheelchair with sister from lobby with no acute distress noted.

## 2023-11-03 NOTE — Progress Notes (Signed)
 Virtual Visit via Video Note  I connected with Andrea Dickson on 11/03/23 at  1:20 PM EDT by a video enabled telemedicine application and verified that I am speaking with the correct person using two identifiers.  Location: Patient: home Provider: office   I discussed the limitations of evaluation and management by telemedicine and the availability of in person appointments. The patient expressed understanding and agreed to proceed.     I discussed the assessment and treatment plan with the patient. The patient was provided an opportunity to ask questions and all were answered. The patient agreed with the plan and demonstrated an understanding of the instructions.   The patient was advised to call back or seek an in-person evaluation if the symptoms worsen or if the condition fails to improve as anticipated.  I provided 20 minutes of non-face-to-face time during this encounter.   Barnie Gull, MD  [facility] MD/PA/NP OP Progress Note  11/03/2023 1:39 PM Andrea Dickson  MRN:  [id number]  Chief Complaint:  Chief Complaint  Patient presents with   Follow-up   Anxiety   Depression   HPI: This patient is a 73 year old separated white female who lives alone in [place]. She has 1 son and 3 grandchildren. She is retired from [employer]. She has a history of depression anxiety and possible bipolar disorder   This patient returns for follow-up after 6 weeks regarding her major depression recurrent generalized anxiety and hand tremor.  She is still dealing with her idiopathic liver cirrhosis and having to have amniocentesis every 2 weeks.  She is undergoing a TIPS procedure in about 2 weeks.  She is still dealing with muscle wasting and liver disease.  She has had a few different falls.  For a while she stopped her medications because she thought the emergency room doctor told her to stop things but now she is back on everything.  She is worried about the upcoming surgery but denies being  significantly depressed.  She denies thoughts of self-harm or suicide.  She has gotten the mirtazapine  which is helping with sleep.  The propranolol  continues to help with the tremor and Valium  is helping with her anxiety. Visit Diagnosis:    ICD-10-CM   1. Bipolar 1 disorder, mixed, moderate (HCC)  F31.62     2. Medication-induced postural tremor  G25.1       Past Psychiatric History: Past outpatient treatment  Past Medical History:  Past Medical History:  Diagnosis Date   Anxiety    Asthma    Bipolar 1 disorder (HCC)    Chronic diarrhea    Cirrhosis (HCC)    Diagnosed September 2020, likely secondary to [facility], s/p Hep A/B vaccination in 2021.    Depression    Diabetes (HCC)    Essential tremor    GERD (gastroesophageal reflux disease)    Headache    High cholesterol     Past Surgical History:  Procedure Laterality Date   ABDOMINAL HYSTERECTOMY  1997   BIOPSY  01/17/2018   Procedure: BIOPSY;  Surgeon: Shaaron Lamar HERO, MD;  [facility];  Service: Endoscopy;;  colon   carpal tunnel Bilateral 1999, 06/2009   COLONOSCOPY  2013   Dr. Donnel: no colon polyps. quality of prep was fair. repeat colonoscopy in five years.    COLONOSCOPY WITH PROPOFOL  N/A 01/17/2018   Dr. Shaaron: Colonic lipoma, 2 tubular adenomas removed.  Random colon biopsies negative.  Next colonoscopy 5 years.   COLONOSCOPY WITH PROPOFOL  N/A 09/26/2022  Procedure: COLONOSCOPY WITH PROPOFOL ;  Surgeon: Eartha Angelia Sieving, MD;  [facility];  Service: Gastroenterology;  Laterality: N/A;   ESOPHAGOGASTRODUODENOSCOPY N/A 04/19/2023   Procedure: EGD (ESOPHAGOGASTRODUODENOSCOPY);  Surgeon: Cindie Carlin POUR, DO;  [facility];  Service: Endoscopy;  Laterality: N/A;  1:45 pm, asa 3   ESOPHAGOGASTRODUODENOSCOPY (EGD) WITH PROPOFOL  N/A 04/06/2019   Procedure: ESOPHAGOGASTRODUODENOSCOPY (EGD) WITH PROPOFOL ;  Surgeon: Shaaron Lamar HERO, MD;   Normal esophagus, mild portal hypertensive  gastropathy, otherwise normal exam.  Recommend repeat EGD in 2 years for variceal screening.    ESOPHAGOGASTRODUODENOSCOPY (EGD) WITH PROPOFOL  N/A 01/19/2022   Procedure: ESOPHAGOGASTRODUODENOSCOPY (EGD) WITH PROPOFOL ;  Surgeon: Shaaron Lamar HERO, MD;  [facility];  Service: Endoscopy;  Laterality: N/A;  12:45 pm, pt knows to arrive at 9:15   ESOPHAGOGASTRODUODENOSCOPY (EGD) WITH PROPOFOL  N/A 09/25/2022   Procedure: ESOPHAGOGASTRODUODENOSCOPY (EGD) WITH PROPOFOL ;  Surgeon: Eartha Angelia Sieving, MD;  [facility];  Service: Gastroenterology;  Laterality: N/A;   GIVENS CAPSULE STUDY N/A 06/16/2023   Procedure: IMAGING PROCEDURE, GI TRACT, INTRALUMINAL, VIA CAPSULE;  Surgeon: Cindie Carlin POUR, DO;  [facility];  Service: Endoscopy;  Laterality: N/A;  730am   IR RADIOLOGIST EVAL & MGMT  04/28/2023   POLYPECTOMY  01/17/2018   Procedure: POLYPECTOMY;  Surgeon: Shaaron Lamar HERO, MD;  [facility];  Service: Endoscopy;;  colon   POLYPECTOMY  09/26/2022   Procedure: POLYPECTOMY;  Surgeon: Eartha Angelia Sieving, MD;  [facility];  Service: Gastroenterology;;   SKIN CANCER EXCISION  2007   TONSILLECTOMY  1965   ulner nerve  Left 06/2009   decompression    Family Psychiatric History: See below  Family History:  Family History  Problem Relation Age of Onset   Diabetes Mother    Brain cancer Mother    Diabetes Father    Heart attack Father    Heart disease Father    Diabetes Sister    Bipolar disorder Brother    Diabetes Brother    Heart disease Brother    Colon cancer Neg Hx    Breast cancer Neg Hx     Social History:  Social History   Socioeconomic History   Marital status: Divorced    Spouse name: Not on file   Number of children: 1   Years of education: 12   Highest education level: Not on file  Occupational History   Occupation: retired    Comment: employed at [employer]  Tobacco Use   Smoking status: Every Day     Current packs/day: 0.50    Average packs/day: 0.5 packs/day for 43.0 years (21.5 ttl pk-yrs)    Types: Cigarettes   Smokeless tobacco: Never   Tobacco comments:    smoking since 73yrs old.    Vaping Use   Vaping status: Never Used  Substance and Sexual Activity   Alcohol use: No    Alcohol/week: 0.0 standard drinks of alcohol   Drug use: No   Sexual activity: Not Currently  Other Topics Concern   Not on file  Social History Narrative   Lives alone.  Retired.  Education 12th grade.  One child.     Caffeine use- sodas, 2 daily   Social Drivers of Corporate investment banker Strain: Low Risk  (11/10/2021)   Received from [facility]   Overall Financial Resource Strain (CARDIA)    Difficulty of Paying Living Expenses: Not hard at all  Food Insecurity: No  Food Insecurity (09/25/2022)   Hunger Vital Sign    Worried About Running Out of Food in the Last Year: Never true    Ran Out of Food in the Last Year: Never true  Transportation Needs: No Transportation Needs (09/25/2022)   PRAPARE - Administrator, Civil Service (Medical): No    Lack of Transportation (Non-Medical): No  Physical Activity: Inactive (11/10/2021)   Received from [facility]   Exercise Vital Sign    On average, how many days per week do you engage in moderate to strenuous exercise (like a brisk walk)?: 0 days    On average, how many minutes do you engage in exercise at this level?: 0 min  Stress: No Stress Concern Present (11/10/2021)   Received from [facility] - Occupational Stress Questionnaire    Feeling of Stress : Not at all  Social Connections: Socially Isolated (11/10/2021)   [facility]   Social Connection and Isolation Panel    In a typical week, how many times do you talk on the phone with family, friends, or neighbors?: More than three times a week    How often do you get together with friends or relatives?: Once a  week    How often do you attend church or religious services?: Never    Do you belong to any clubs or organizations such as church groups, unions, fraternal or athletic groups, or school groups?: No    How often do you attend meetings of the clubs or organizations you belong to?: Never    Are you married, widowed, divorced, separated, never married, or living with a partner?: Divorced    Allergies:  Allergies  Allergen Reactions   Pramipexole Other (See Comments)    Shaking, palpitations, headache, faint feeling   Crestor [Rosuvastatin Calcium ]     Muscle pain   Bee Pollen Other (See Comments)    Eyes and nose run   Pollen Extract Other (See Comments)    Eyes and nose run    Metabolic Disorder Labs: Lab Results  Component Value Date   HGBA1C 5.4 04/24/2015   MPG 108 04/24/2015   No results found for: PROLACTIN Lab Results  Component Value Date   CHOL 178 02/17/2016   TRIG 229 (H) 02/17/2016   HDL 30 (L) 02/17/2016   CHOLHDL 5.9 (H) 02/17/2016   VLDL 46 (H) 02/17/2016   LDLCALC 102 (H) 02/17/2016   LDLCALC 126 (H) 11/18/2015   Lab Results  Component Value Date   TSH 1.00 12/24/2021   TSH 2.96 08/29/2019    Therapeutic Level Labs: Lab Results  Component Value Date   LITHIUM  0.8 08/29/2019   LITHIUM  0.4 (L) 10/13/2018   Lab Results  Component Value Date   VALPROATE 62.2 08/20/2016   VALPROATE 83.0 10/14/2015   No results found for: CBMZ  Current Medications: Current Outpatient Medications  Medication Sig Dispense Refill   acetaminophen  (TYLENOL ) 500 MG tablet Take 1,000 mg by mouth 2 (two) times daily as needed for moderate pain or headache.     buPROPion  (WELLBUTRIN  XL) 150 MG 24 hr tablet Take 1 tablet (150 mg total) by mouth every morning. 90 tablet 2   cetirizine (ZYRTEC) 10 MG tablet Take 10 mg by mouth daily. Aller-Tec     Cholecalciferol (VITAMIN D3) 50 MCG (2000 UT) TABS Take 2,000 Units by mouth daily.      diazepam  (VALIUM ) 2 MG tablet Take  1  tablet (2 mg total) by mouth daily as needed for anxiety. 90 tablet 2   estrogens , conjugated, (PREMARIN ) 1.25 MG tablet Take 1.25 mg by mouth daily.      ezetimibe (ZETIA) 10 MG tablet Take 10 mg by mouth daily.     fluticasone (FLONASE) 50 MCG/ACT nasal spray Place 2 sprays into both nostrils daily.     furosemide  (LASIX ) 40 MG tablet Take 1 tablet (40 mg total) by mouth daily. 30 tablet 11   hyoscyamine  (LEVSIN  SL) 0.125 MG SL tablet Place 1 tablet (0.125 mg total) under the tongue every 6 (six) hours as needed for cramping. 30 tablet 1   Lactobacillus-Inulin (CULTURELLE DIGESTIVE HEALTH) CAPS Take 1 capsule by mouth daily.     mirtazapine  (REMERON ) 15 MG tablet Take 1 tablet (15 mg total) by mouth at bedtime. 90 tablet 2   omeprazole  (PRILOSEC) 40 MG capsule TAKE ONE (1) CAPSULE BY MOUTH TWICE DAILY AT BREAKFAST AND AT BEDTIME 60 capsule 11   ondansetron  (ZOFRAN ) 4 MG tablet TAKE 1 TABLET BY MOUTH EVERY 8 HOURS AS NEEDED FOR NAUSEA & VOMITING 30 tablet 10   Pancrelipase , Lip-Prot-Amyl, (ZENPEP ) 60000-189600 units CPEP Take 1 capsule by mouth 3 (three) times daily with meals. 100 capsule 11   propranolol  ER (INDERAL  LA) 80 MG 24 hr capsule Take 1 capsule (80 mg total) by mouth daily. 90 capsule 2   spironolactone  (ALDACTONE ) 100 MG tablet Take 1 tablet (100 mg total) by mouth daily. 30 tablet 11   trimethoprim  (TRIMPEX ) 100 MG tablet TAKE 1 TABLET BY MOUTH EVERY DAY AT BEDTIME 289 tablet 11   No current facility-administered medications for this visit.     Musculoskeletal: Strength & Muscle Tone: decreased Gait & Station: unsteady Patient leans: N/A  Psychiatric Specialty Exam: Review of Systems  Gastrointestinal:  Positive for abdominal distention.  Musculoskeletal:  Positive for gait problem.  Neurological:  Positive for weakness.  All other systems reviewed and are negative.   There were no vitals taken for this visit.There is no height or weight on file to calculate BMI.   General Appearance: Casual and Fairly Groomed  Eye Contact:  Good  Speech:  Clear and Coherent  Volume:  Normal  Mood:  Euthymic  Affect:  Congruent  Thought Process:  Goal Directed  Orientation:  Full (Time, Place, and Person)  Thought Content: WDL   Suicidal Thoughts:  No  Homicidal Thoughts:  No  Memory:  Immediate;   Good Recent;   Good Remote;   NA  Judgement:  Good  Insight:  Fair  Psychomotor Activity:  Decreased  Concentration:  Concentration: Good and Attention Span: Good  Recall:  Good  Fund of Knowledge: Good  Language: Good  Akathisia:  No  Handed:  Right  AIMS (if indicated): not done  Assets:  Communication Skills Desire for Improvement Resilience Social Support  ADL's:  Intact  Cognition: WNL  Sleep:  Good   Screenings: PHQ2-9    Flowsheet Row Video Visit from 09/30/2021 in [facility] [place] Video Visit from 06/26/2021 in [facility] at [place] Video Visit from 03/26/2021 in [facility] at [place] Video Visit from 12/18/2020 in [facility] at [place] Video Visit from 09/24/2020 in [facility] at [facility] Total Score 0 0 0 0 0   Flowsheet Row Admission (Discharged) from 04/19/2023 in [facility] to Hosp-Admission (Discharged) from 09/24/2022  in [facility] MEDICAL SURGICAL UNIT ED from 07/25/2022 in [facility] Emergency Department at [facility]  C-SSRS RISK CATEGORY No Risk No Risk No Risk     Assessment and Plan: This patient is a 73 year old female with a history of bipolar disorder, depressed type generalized anxiety disorder tremor and nonalcoholic liver disease with associated ascites varices and muscle wasting.  For now she has been stable psychiatrically so she will continue mirtazapine  15 mg at bedtime for anxiety sleep and appetite, Valium  2 mg once daily as needed for  generalized anxiety, Wellbutrin  XL 150 mg daily for depression and Inderal  LA 80 mg every morning for tremor.  She will return to see me in 3 months  Collaboration of Care: Collaboration of Care: Other provider involved in patient's care AEB notes are shared with GI on the epic system  Patient/Guardian was advised Release of Information must be obtained prior to any record release in order to collaborate their care with an outside provider. Patient/Guardian was advised if they have not already done so to contact the registration department to sign all necessary forms in order for us  to release information regarding their care.   Consent: Patient/Guardian gives verbal consent for treatment and assignment of benefits for services provided during this visit. Patient/Guardian expressed understanding and agreed to proceed.    Barnie Gull, MD 11/03/2023, 1:39 PM

## 2023-11-03 NOTE — Procedures (Signed)
 PROCEDURE SUMMARY:  Successful ultrasound guided paracentesis from the right lower quadrant.  Yielded 5 L of pale yellow fluid.  No immediate complications.  The patient tolerated the procedure well.   Specimen was sent for labs.  EBL < 5mL  The patient is scheduled for TIPS on 11.7.25 with IR Attending Dr. Juliene Balder

## 2023-11-04 LAB — PATHOLOGIST SMEAR REVIEW

## 2023-11-05 DIAGNOSIS — I8500 Esophageal varices without bleeding: Secondary | ICD-10-CM | POA: Diagnosis not present

## 2023-11-05 DIAGNOSIS — R296 Repeated falls: Secondary | ICD-10-CM | POA: Diagnosis not present

## 2023-11-05 DIAGNOSIS — R251 Tremor, unspecified: Secondary | ICD-10-CM | POA: Diagnosis not present

## 2023-11-05 DIAGNOSIS — D649 Anemia, unspecified: Secondary | ICD-10-CM | POA: Diagnosis not present

## 2023-11-05 DIAGNOSIS — E1169 Type 2 diabetes mellitus with other specified complication: Secondary | ICD-10-CM | POA: Diagnosis not present

## 2023-11-05 DIAGNOSIS — E782 Mixed hyperlipidemia: Secondary | ICD-10-CM | POA: Diagnosis not present

## 2023-11-05 DIAGNOSIS — S2241XD Multiple fractures of ribs, right side, subsequent encounter for fracture with routine healing: Secondary | ICD-10-CM | POA: Diagnosis not present

## 2023-11-05 DIAGNOSIS — N390 Urinary tract infection, site not specified: Secondary | ICD-10-CM | POA: Diagnosis not present

## 2023-11-05 DIAGNOSIS — I10 Essential (primary) hypertension: Secondary | ICD-10-CM | POA: Diagnosis not present

## 2023-11-05 DIAGNOSIS — J309 Allergic rhinitis, unspecified: Secondary | ICD-10-CM | POA: Diagnosis not present

## 2023-11-05 DIAGNOSIS — K7460 Unspecified cirrhosis of liver: Secondary | ICD-10-CM | POA: Diagnosis not present

## 2023-11-08 LAB — CULTURE, BODY FLUID W GRAM STAIN -BOTTLE: Culture: NO GROWTH

## 2023-11-11 ENCOUNTER — Telehealth: Payer: Self-pay | Admitting: *Deleted

## 2023-11-11 NOTE — Telephone Encounter (Signed)
 Pt called and wants to know if she needs a paracentesis before she has the TIPS procedure next week. Please advise.

## 2023-11-11 NOTE — Telephone Encounter (Signed)
 Spoke to pt, she informed me that she need a order for paracentesis. She would like to have it done the day before her procedure.

## 2023-11-15 ENCOUNTER — Other Ambulatory Visit (HOSPITAL_COMMUNITY): Payer: Self-pay | Admitting: Gastroenterology

## 2023-11-15 DIAGNOSIS — R262 Difficulty in walking, not elsewhere classified: Secondary | ICD-10-CM | POA: Diagnosis not present

## 2023-11-15 DIAGNOSIS — I10 Essential (primary) hypertension: Secondary | ICD-10-CM | POA: Diagnosis not present

## 2023-11-15 DIAGNOSIS — R296 Repeated falls: Secondary | ICD-10-CM | POA: Diagnosis not present

## 2023-11-15 DIAGNOSIS — D649 Anemia, unspecified: Secondary | ICD-10-CM | POA: Diagnosis not present

## 2023-11-15 DIAGNOSIS — R188 Other ascites: Secondary | ICD-10-CM

## 2023-11-15 DIAGNOSIS — Z9181 History of falling: Secondary | ICD-10-CM | POA: Diagnosis not present

## 2023-11-15 NOTE — Telephone Encounter (Signed)
 Spoke to informed her of recommendations. Pt voiced understanding. Trying to get pt scheduled for this week.

## 2023-11-16 ENCOUNTER — Other Ambulatory Visit: Payer: Self-pay | Admitting: *Deleted

## 2023-11-16 DIAGNOSIS — R188 Other ascites: Secondary | ICD-10-CM

## 2023-11-16 NOTE — Telephone Encounter (Signed)
 Pt standing orders placed.

## 2023-11-17 ENCOUNTER — Ambulatory Visit: Payer: Self-pay | Admitting: Gastroenterology

## 2023-11-17 ENCOUNTER — Encounter (HOSPITAL_COMMUNITY): Payer: Self-pay

## 2023-11-17 ENCOUNTER — Inpatient Hospital Stay (HOSPITAL_COMMUNITY)
Admission: RE | Admit: 2023-11-17 | Discharge: 2023-11-17 | Disposition: A | Discharge status: Home / Self Care | Source: Ambulatory Visit | Attending: Gastroenterology | Admitting: Gastroenterology

## 2023-11-17 DIAGNOSIS — K7460 Unspecified cirrhosis of liver: Secondary | ICD-10-CM | POA: Insufficient documentation

## 2023-11-17 DIAGNOSIS — R188 Other ascites: Secondary | ICD-10-CM | POA: Insufficient documentation

## 2023-11-17 LAB — BODY FLUID CELL COUNT WITH DIFFERENTIAL
Lymphs, Fluid: 65 %
Monocyte-Macrophage-Serous Fluid: 23 % — ABNORMAL LOW (ref 50–90)
Neutrophil Count, Fluid: 12 % (ref 0–25)
Total Nucleated Cell Count, Fluid: 208 uL (ref 0–1000)

## 2023-11-17 LAB — GRAM STAIN

## 2023-11-17 MED ORDER — LIDOCAINE HCL (PF) 2 % IJ SOLN
INTRAMUSCULAR | Status: AC
  Filled 2023-11-17: qty 10

## 2023-11-17 MED ORDER — LIDOCAINE HCL (PF) 2 % IJ SOLN
10.0000 mL | Freq: Once | INTRAMUSCULAR | Status: AC
  Administered 2023-11-17: 10 mL

## 2023-11-17 MED ORDER — ALBUMIN HUMAN 25 % IV SOLN
0.0000 g | Freq: Once | INTRAVENOUS | Status: AC
  Administered 2023-11-17: 50 g via INTRAVENOUS
  Filled 2023-11-17: qty 400

## 2023-11-17 MED ORDER — ALBUMIN HUMAN 25 % IV SOLN
INTRAVENOUS | Status: AC
  Filled 2023-11-17: qty 200

## 2023-11-17 NOTE — Procedures (Signed)
 PROCEDURE SUMMARY:  Successful ultrasound guided paracentesis from the left lower quadrant.  Yielded 5 L of clear yellow fluid.  No immediate complications.  The patient tolerated the procedure well.   Specimen sent for labs.  EBL < 2 mL  The patient has previously been formally evaluated by the [facility] Interventional Radiology [facility] Hy[facility] and is being actively followed for potential future intervention.  **  Patient is scheduled for TIPS 11/19/23.    Warren Dais, AGACNP-BC 11/17/2023, 11:29 AM

## 2023-11-17 NOTE — Progress Notes (Signed)
 Patient tolerated left sided Paracentesis and 50G of IV albumin  well today and 5 Liters of clear yellow ascites removed with labs collected and sent for processing. Patient verbalized understanding of discharge instructions and left via wheelchair with family with no acute distress noted.

## 2023-11-18 ENCOUNTER — Other Ambulatory Visit: Payer: Self-pay

## 2023-11-18 ENCOUNTER — Encounter (HOSPITAL_COMMUNITY): Payer: Self-pay | Admitting: Diagnostic Radiology

## 2023-11-18 NOTE — Progress Notes (Signed)
 SDW call  Patient was given pre-op instructions over the phone. Patient verbalized understanding of instructions provided.     PCP - Dr. Elspeth Messier Cardiologist -  Pulmonary:    PPM/ICD - denies Device Orders - na Rep Notified - na   Chest x-ray - na EKG -  04/19/2023 Stress Test - 2003 ECHO - 08/31/2023 Cardiac Cath -   Sleep Study/sleep apnea/CPAP: denies  Type II diabetic.  Does not take medication for diabetes and does not check her blood sugars Fasting Blood sugar range: na How often check sugars:na   Blood Thinner Instructions: denies Aspirin Instructions:na   ERAS Protcol - NPO  Anesthesia review: No   Patient denies shortness of breath, fever, cough and chest pain over the phone call  Your procedure is scheduled on Friday November 19, 2023  Report to [facility] Main Entrance A at  0530  A.M., then check in with the Admitting office.  Call this number if you have problems the morning of surgery:  [phone]   If you have any questions prior to your surgery date call [phone]: Open Monday-Friday 8am-4pm If you experience any cold or flu symptoms such as cough, fever, chills, shortness of breath, etc. between now and your scheduled surgery, please notify us  at the above number    Remember:  Do not eat or drink after midnight the night before your surgery  Take these medicines the morning of surgery with A SIP OF WATER :  Wellbutrin , premarin , prilosec, propranolol   As needed: Tylenol , valium , flonase, levsin , zofran   As of today, STOP taking any Aspirin (unless otherwise instructed by your surgeon) Aleve, Naproxen, Ibuprofen, Motrin, Advil, Goody's, BC's, all herbal medications, fish oil, and all vitamins.

## 2023-11-19 ENCOUNTER — Inpatient Hospital Stay (HOSPITAL_COMMUNITY)
Admission: RE | Admit: 2023-11-19 | Discharge: 2023-11-23 | DRG: 405 | Disposition: A | Discharge status: Home Health | Attending: Internal Medicine | Admitting: Internal Medicine

## 2023-11-19 ENCOUNTER — Inpatient Hospital Stay (HOSPITAL_COMMUNITY): Admitting: Certified Registered Nurse Anesthetist

## 2023-11-19 ENCOUNTER — Inpatient Hospital Stay (HOSPITAL_COMMUNITY)
Admission: RE | Admit: 2023-11-19 | Discharge: 2023-11-19 | Disposition: A | Discharge status: Home / Self Care | Source: Ambulatory Visit | Attending: Diagnostic Radiology | Admitting: Diagnostic Radiology

## 2023-11-19 ENCOUNTER — Other Ambulatory Visit: Payer: Self-pay

## 2023-11-19 ENCOUNTER — Encounter (HOSPITAL_COMMUNITY): Payer: Self-pay | Admitting: Diagnostic Radiology

## 2023-11-19 ENCOUNTER — Encounter (HOSPITAL_COMMUNITY)
Admission: RE | Disposition: A | Discharge status: Home Health | Payer: Self-pay | Source: Home / Self Care | Attending: Diagnostic Radiology

## 2023-11-19 VITALS — BP 125/58 | HR 79 | Temp 98.2°F | Resp 18

## 2023-11-19 DIAGNOSIS — Z8249 Family history of ischemic heart disease and other diseases of the circulatory system: Secondary | ICD-10-CM | POA: Diagnosis not present

## 2023-11-19 DIAGNOSIS — J45909 Unspecified asthma, uncomplicated: Secondary | ICD-10-CM | POA: Diagnosis present

## 2023-11-19 DIAGNOSIS — Z85828 Personal history of other malignant neoplasm of skin: Secondary | ICD-10-CM | POA: Diagnosis not present

## 2023-11-19 DIAGNOSIS — E119 Type 2 diabetes mellitus without complications: Secondary | ICD-10-CM | POA: Diagnosis present

## 2023-11-19 DIAGNOSIS — I8510 Secondary esophageal varices without bleeding: Secondary | ICD-10-CM | POA: Diagnosis present

## 2023-11-19 DIAGNOSIS — Z87891 Personal history of nicotine dependence: Secondary | ICD-10-CM

## 2023-11-19 DIAGNOSIS — R188 Other ascites: Principal | ICD-10-CM | POA: Diagnosis present

## 2023-11-19 DIAGNOSIS — F3110 Bipolar disorder, current episode manic without psychotic features, unspecified: Secondary | ICD-10-CM | POA: Diagnosis not present

## 2023-11-19 DIAGNOSIS — Z604 Social exclusion and rejection: Secondary | ICD-10-CM | POA: Diagnosis present

## 2023-11-19 DIAGNOSIS — D696 Thrombocytopenia, unspecified: Secondary | ICD-10-CM | POA: Diagnosis not present

## 2023-11-19 DIAGNOSIS — K219 Gastro-esophageal reflux disease without esophagitis: Secondary | ICD-10-CM | POA: Diagnosis present

## 2023-11-19 DIAGNOSIS — E876 Hypokalemia: Secondary | ICD-10-CM | POA: Diagnosis not present

## 2023-11-19 DIAGNOSIS — I5033 Acute on chronic diastolic (congestive) heart failure: Secondary | ICD-10-CM | POA: Diagnosis present

## 2023-11-19 DIAGNOSIS — E782 Mixed hyperlipidemia: Secondary | ICD-10-CM | POA: Diagnosis present

## 2023-11-19 DIAGNOSIS — K7469 Other cirrhosis of liver: Secondary | ICD-10-CM | POA: Diagnosis not present

## 2023-11-19 DIAGNOSIS — K7460 Unspecified cirrhosis of liver: Principal | ICD-10-CM | POA: Diagnosis present

## 2023-11-19 DIAGNOSIS — R54 Age-related physical debility: Secondary | ICD-10-CM | POA: Diagnosis present

## 2023-11-19 DIAGNOSIS — R0609 Other forms of dyspnea: Secondary | ICD-10-CM | POA: Diagnosis not present

## 2023-11-19 DIAGNOSIS — Z95828 Presence of other vascular implants and grafts: Secondary | ICD-10-CM | POA: Diagnosis not present

## 2023-11-19 DIAGNOSIS — Z888 Allergy status to other drugs, medicaments and biological substances status: Secondary | ICD-10-CM

## 2023-11-19 DIAGNOSIS — Z79899 Other long term (current) drug therapy: Secondary | ICD-10-CM

## 2023-11-19 DIAGNOSIS — G250 Essential tremor: Secondary | ICD-10-CM | POA: Diagnosis present

## 2023-11-19 DIAGNOSIS — Z91030 Bee allergy status: Secondary | ICD-10-CM

## 2023-11-19 DIAGNOSIS — R079 Chest pain, unspecified: Secondary | ICD-10-CM | POA: Diagnosis not present

## 2023-11-19 DIAGNOSIS — Z833 Family history of diabetes mellitus: Secondary | ICD-10-CM | POA: Diagnosis not present

## 2023-11-19 DIAGNOSIS — Z90710 Acquired absence of both cervix and uterus: Secondary | ICD-10-CM

## 2023-11-19 DIAGNOSIS — N179 Acute kidney failure, unspecified: Secondary | ICD-10-CM | POA: Diagnosis present

## 2023-11-19 DIAGNOSIS — Z818 Family history of other mental and behavioral disorders: Secondary | ICD-10-CM

## 2023-11-19 DIAGNOSIS — F319 Bipolar disorder, unspecified: Secondary | ICD-10-CM | POA: Diagnosis present

## 2023-11-19 DIAGNOSIS — I110 Hypertensive heart disease with heart failure: Secondary | ICD-10-CM | POA: Diagnosis present

## 2023-11-19 DIAGNOSIS — I5031 Acute diastolic (congestive) heart failure: Secondary | ICD-10-CM | POA: Diagnosis not present

## 2023-11-19 DIAGNOSIS — Z87440 Personal history of urinary (tract) infections: Secondary | ICD-10-CM

## 2023-11-19 DIAGNOSIS — K7581 Nonalcoholic steatohepatitis (NASH): Secondary | ICD-10-CM | POA: Diagnosis present

## 2023-11-19 HISTORY — PX: IR TIPS: IMG2295

## 2023-11-19 HISTORY — PX: IR INTRAVASCULAR ULTRASOUND NON CORONARY: IMG6085

## 2023-11-19 HISTORY — PX: TIPS PROCEDURE: SHX808

## 2023-11-19 HISTORY — PX: IR PARACENTESIS: IMG2679

## 2023-11-19 HISTORY — PX: IR US GUIDE VASC ACCESS RIGHT: IMG2390

## 2023-11-19 LAB — POCT I-STAT 7, (LYTES, BLD GAS, ICA,H+H)
Acid-base deficit: 5 mmol/L — ABNORMAL HIGH (ref 0.0–2.0)
Acid-base deficit: 6 mmol/L — ABNORMAL HIGH (ref 0.0–2.0)
Bicarbonate: 19 mmol/L — ABNORMAL LOW (ref 20.0–28.0)
Bicarbonate: 19.8 mmol/L — ABNORMAL LOW (ref 20.0–28.0)
Calcium, Ion: 1.11 mmol/L — ABNORMAL LOW (ref 1.15–1.40)
Calcium, Ion: 1.11 mmol/L — ABNORMAL LOW (ref 1.15–1.40)
HCT: 25 % — ABNORMAL LOW (ref 36.0–46.0)
HCT: 23 % — ABNORMAL LOW (ref 36.0–46.0)
Hemoglobin: 8.5 g/dL — ABNORMAL LOW (ref 12.0–15.0)
Hemoglobin: 7.8 g/dL — ABNORMAL LOW (ref 12.0–15.0)
O2 Saturation: 100 %
O2 Saturation: 100 %
Patient temperature: 35
Patient temperature: 36
Potassium: 3.7 mmol/L (ref 3.5–5.1)
Potassium: 3.9 mmol/L (ref 3.5–5.1)
Sodium: 146 mmol/L — ABNORMAL HIGH (ref 135–145)
Sodium: 145 mmol/L (ref 135–145)
TCO2: 20 mmol/L — ABNORMAL LOW (ref 22–32)
TCO2: 21 mmol/L — ABNORMAL LOW (ref 22–32)
pCO2 arterial: 26.5 mmHg — ABNORMAL LOW (ref 32–48)
pCO2 arterial: 35.5 mmHg (ref 32–48)
pH, Arterial: 7.456 — ABNORMAL HIGH (ref 7.35–7.45)
pH, Arterial: 7.35 (ref 7.35–7.45)
pO2, Arterial: 228 mmHg — ABNORMAL HIGH (ref 83–108)
pO2, Arterial: 212 mmHg — ABNORMAL HIGH (ref 83–108)

## 2023-11-19 LAB — CBC WITH DIFFERENTIAL/PLATELET
Abs Immature Granulocytes: 0.01 K/uL (ref 0.00–0.07)
Basophils Absolute: 0 K/uL (ref 0.0–0.1)
Basophils Relative: 1 %
Eosinophils Absolute: 0 K/uL (ref 0.0–0.5)
Eosinophils Relative: 1 %
HCT: 33.1 % — ABNORMAL LOW (ref 36.0–46.0)
Hemoglobin: 10.8 g/dL — ABNORMAL LOW (ref 12.0–15.0)
Immature Granulocytes: 0 %
Lymphocytes Relative: 13 %
Lymphs Abs: 0.4 K/uL — ABNORMAL LOW (ref 0.7–4.0)
MCH: 29.2 pg (ref 26.0–34.0)
MCHC: 32.6 g/dL (ref 30.0–36.0)
MCV: 89.5 fL (ref 80.0–100.0)
Monocytes Absolute: 0.2 K/uL (ref 0.1–1.0)
Monocytes Relative: 8 %
Neutro Abs: 2.5 K/uL (ref 1.7–7.7)
Neutrophils Relative %: 77 %
Platelets: 73 K/uL — ABNORMAL LOW (ref 150–400)
RBC: 3.7 MIL/uL — ABNORMAL LOW (ref 3.87–5.11)
RDW: 15.7 % — ABNORMAL HIGH (ref 11.5–15.5)
WBC: 3.2 K/uL — ABNORMAL LOW (ref 4.0–10.5)
nRBC: 0 % (ref 0.0–0.2)

## 2023-11-19 LAB — PROTIME-INR
INR: 1.2 (ref 0.8–1.2)
Prothrombin Time: 16 s — ABNORMAL HIGH (ref 11.4–15.2)

## 2023-11-19 LAB — TYPE AND SCREEN
ABO/RH(D): B POS
Antibody Screen: NEGATIVE

## 2023-11-19 LAB — COMPREHENSIVE METABOLIC PANEL WITH GFR
ALT: 14 U/L (ref 0–44)
AST: 24 U/L (ref 15–41)
Albumin: 3.5 g/dL (ref 3.5–5.0)
Alkaline Phosphatase: 112 U/L (ref 38–126)
Anion gap: 11 (ref 5–15)
BUN: 11 mg/dL (ref 8–23)
CO2: 22 mmol/L (ref 22–32)
Calcium: 8.8 mg/dL — ABNORMAL LOW (ref 8.9–10.3)
Chloride: 107 mmol/L (ref 98–111)
Creatinine, Ser: 1.31 mg/dL — ABNORMAL HIGH (ref 0.44–1.00)
GFR, Estimated: 43 mL/min — ABNORMAL LOW (ref >60)
Glucose, Bld: 149 mg/dL — ABNORMAL HIGH (ref 70–99)
Potassium: 4 mmol/L (ref 3.5–5.1)
Sodium: 140 mmol/L (ref 135–145)
Total Bilirubin: 1.9 mg/dL — ABNORMAL HIGH (ref 0.0–1.2)
Total Protein: 6.6 g/dL (ref 6.5–8.1)

## 2023-11-19 LAB — GLUCOSE, CAPILLARY
Glucose-Capillary: 137 mg/dL — ABNORMAL HIGH (ref 70–99)
Glucose-Capillary: 143 mg/dL — ABNORMAL HIGH (ref 70–99)
Glucose-Capillary: 136 mg/dL — ABNORMAL HIGH (ref 70–99)

## 2023-11-19 SURGERY — INSERTION, SHUNT, INTRAHEPATIC PORTOSYSTEMIC, TRANSJUGULAR
Anesthesia: General

## 2023-11-19 MED ORDER — CHLORHEXIDINE GLUCONATE CLOTH 2 % EX PADS: 6.0000 | MEDICATED_PAD | Freq: Every day | CUTANEOUS | Status: DC

## 2023-11-19 MED ORDER — ONDANSETRON HCL 4 MG/2ML IJ SOLN
INTRAMUSCULAR | Status: DC | PRN
  Administered 2023-11-19: 4 mg via INTRAVENOUS

## 2023-11-19 MED ORDER — MIDAZOLAM HCL 2 MG/2ML IJ SOLN
INTRAMUSCULAR | Status: AC
  Filled 2023-11-19: qty 2

## 2023-11-19 MED ORDER — PANTOPRAZOLE SODIUM 40 MG PO TBEC
40.0000 mg | DELAYED_RELEASE_TABLET | Freq: Every day | ORAL | Status: DC
  Administered 2023-11-20 – 2023-11-23 (×4): 40 mg via ORAL
  Filled 2023-11-19 (×4): qty 1

## 2023-11-19 MED ORDER — ROCURONIUM BROMIDE 10 MG/ML (PF) SYRINGE
PREFILLED_SYRINGE | INTRAVENOUS | Status: DC | PRN
  Administered 2023-11-19: 50 mg via INTRAVENOUS
  Administered 2023-11-19: 10 mg via INTRAVENOUS

## 2023-11-19 MED ORDER — ACETAMINOPHEN 500 MG PO TABS
1000.0000 mg | ORAL_TABLET | Freq: Two times a day (BID) | ORAL | Status: DC | PRN
  Administered 2023-11-19 – 2023-11-20 (×3): 1000 mg via ORAL
  Filled 2023-11-19 (×3): qty 2

## 2023-11-19 MED ORDER — EZETIMIBE 10 MG PO TABS
10.0000 mg | ORAL_TABLET | Freq: Every day | ORAL | Status: DC
  Administered 2023-11-19 – 2023-11-22 (×4): 10 mg via ORAL
  Filled 2023-11-19 (×4): qty 1

## 2023-11-19 MED ORDER — DIAZEPAM 5 MG PO TABS: 2.5000 mg | ORAL_TABLET | Freq: Every day | ORAL | Status: DC | PRN

## 2023-11-19 MED ORDER — LIDOCAINE 2% (20 MG/ML) 5 ML SYRINGE
INTRAMUSCULAR | Status: DC | PRN
  Administered 2023-11-19: 40 mg via INTRAVENOUS

## 2023-11-19 MED ORDER — TRIMETHOPRIM 100 MG PO TABS
100.0000 mg | ORAL_TABLET | Freq: Every day | ORAL | Status: DC
  Administered 2023-11-19 – 2023-11-22 (×4): 100 mg via ORAL
  Filled 2023-11-19 (×5): qty 1

## 2023-11-19 MED ORDER — FENTANYL CITRATE (PF) 100 MCG/2ML IJ SOLN: 25.0000 ug | INTRAMUSCULAR | Status: DC | PRN

## 2023-11-19 MED ORDER — ALBUMIN HUMAN 5 % IV SOLN
INTRAVENOUS | Status: DC | PRN
Start: 2023-11-19 — End: 2023-11-19

## 2023-11-19 MED ORDER — PHENYLEPHRINE 80 MCG/ML (10ML) SYRINGE FOR IV PUSH (FOR BLOOD PRESSURE SUPPORT)
PREFILLED_SYRINGE | INTRAVENOUS | Status: DC | PRN
  Administered 2023-11-19: 120 ug via INTRAVENOUS
  Administered 2023-11-19: 80 ug via INTRAVENOUS
  Administered 2023-11-19 (×2): 160 ug via INTRAVENOUS
  Administered 2023-11-19: 240 ug via INTRAVENOUS
  Administered 2023-11-19: 160 ug via INTRAVENOUS
  Administered 2023-11-19: 40 ug via INTRAVENOUS

## 2023-11-19 MED ORDER — SPIRONOLACTONE 25 MG PO TABS
100.0000 mg | ORAL_TABLET | Freq: Every day | ORAL | Status: DC
  Administered 2023-11-20 – 2023-11-23 (×4): 100 mg via ORAL
  Filled 2023-11-19 (×4): qty 4

## 2023-11-19 MED ORDER — LACTULOSE 10 GM/15ML PO SOLN
30.0000 g | Freq: Three times a day (TID) | ORAL | Status: DC
  Administered 2023-11-20 – 2023-11-22 (×9): 30 g via ORAL
  Filled 2023-11-19 (×10): qty 45

## 2023-11-19 MED ORDER — ONDANSETRON HCL 4 MG PO TABS
4.0000 mg | ORAL_TABLET | Freq: Three times a day (TID) | ORAL | Status: DC | PRN
  Administered 2023-11-21: 4 mg via ORAL
  Filled 2023-11-19: qty 1

## 2023-11-19 MED ORDER — PROPRANOLOL HCL ER 80 MG PO CP24
80.0000 mg | ORAL_CAPSULE | Freq: Every day | ORAL | Status: DC
  Administered 2023-11-20 – 2023-11-23 (×4): 80 mg via ORAL
  Filled 2023-11-19 (×4): qty 1

## 2023-11-19 MED ORDER — MIRTAZAPINE 15 MG PO TABS
15.0000 mg | ORAL_TABLET | Freq: Every day | ORAL | Status: DC
  Administered 2023-11-19 – 2023-11-22 (×4): 15 mg via ORAL
  Filled 2023-11-19 (×4): qty 1

## 2023-11-19 MED ORDER — PANTOPRAZOLE SODIUM 40 MG IV SOLR
80.0000 mg | Freq: Once | INTRAVENOUS | Status: AC
  Administered 2023-11-19: 80 mg via INTRAVENOUS
  Filled 2023-11-19: qty 20

## 2023-11-19 MED ORDER — SODIUM CHLORIDE 0.9 % IV SOLN
2.0000 g | INTRAVENOUS | Status: AC
  Administered 2023-11-19: 2 g via INTRAVENOUS
  Filled 2023-11-19: qty 20

## 2023-11-19 MED ORDER — AMISULPRIDE (ANTIEMETIC) 5 MG/2ML IV SOLN
INTRAVENOUS | Status: AC
Start: 2023-11-19 — End: 2023-11-19
  Filled 2023-11-19: qty 4

## 2023-11-19 MED ORDER — PHENYLEPHRINE HCL-NACL 20-0.9 MG/250ML-% IV SOLN
INTRAVENOUS | Status: DC | PRN
  Administered 2023-11-19: 80 ug/min via INTRAVENOUS

## 2023-11-19 MED ORDER — AMISULPRIDE (ANTIEMETIC) 5 MG/2ML IV SOLN
10.0000 mg | Freq: Once | INTRAVENOUS | Status: AC | PRN
Start: 2023-11-19 — End: 2023-11-19
  Administered 2023-11-19: 10 mg via INTRAVENOUS

## 2023-11-19 MED ORDER — FENTANYL CITRATE (PF) 250 MCG/5ML IJ SOLN
INTRAMUSCULAR | Status: DC | PRN
  Administered 2023-11-19: 100 ug via INTRAVENOUS

## 2023-11-19 MED ORDER — MIDAZOLAM HCL (PF) 2 MG/2ML IJ SOLN
INTRAMUSCULAR | Status: DC | PRN
  Administered 2023-11-19: 1 mg via INTRAVENOUS

## 2023-11-19 MED ORDER — PROPOFOL 10 MG/ML IV BOLUS
INTRAVENOUS | Status: DC | PRN
  Administered 2023-11-19 (×2): 20 mg via INTRAVENOUS
  Administered 2023-11-19: 120 mg via INTRAVENOUS

## 2023-11-19 MED ORDER — CHLORHEXIDINE GLUCONATE 0.12 % MT SOLN
15.0000 mL | Freq: Once | OROMUCOSAL | Status: AC
  Administered 2023-11-19: 15 mL via OROMUCOSAL
  Filled 2023-11-19: qty 15

## 2023-11-19 MED ORDER — BUPROPION HCL ER (XL) 150 MG PO TB24
150.0000 mg | ORAL_TABLET | ORAL | Status: DC
  Administered 2023-11-20 – 2023-11-23 (×4): 150 mg via ORAL
  Filled 2023-11-19 (×4): qty 1

## 2023-11-19 MED ORDER — DEXAMETHASONE SOD PHOSPHATE PF 10 MG/ML IJ SOLN
INTRAMUSCULAR | Status: DC | PRN
  Administered 2023-11-19: 4 mg via INTRAVENOUS

## 2023-11-19 MED ORDER — FENTANYL CITRATE (PF) 100 MCG/2ML IJ SOLN
INTRAMUSCULAR | Status: AC
  Filled 2023-11-19: qty 4

## 2023-11-19 MED ORDER — FUROSEMIDE 40 MG PO TABS
40.0000 mg | ORAL_TABLET | Freq: Every day | ORAL | Status: DC
  Administered 2023-11-20: 40 mg via ORAL
  Filled 2023-11-19 (×2): qty 1

## 2023-11-19 MED ORDER — IOHEXOL 300 MG/ML  SOLN
150.0000 mL | Freq: Once | INTRAMUSCULAR | Status: AC | PRN
  Administered 2023-11-19: 75 mL via INTRAVENOUS

## 2023-11-19 MED ORDER — SODIUM CHLORIDE 0.9 % IV SOLN: INTRAVENOUS | Status: DC

## 2023-11-19 MED ORDER — ONDANSETRON HCL 4 MG/2ML IJ SOLN
4.0000 mg | Freq: Once | INTRAMUSCULAR | Status: AC
  Administered 2023-11-19: 4 mg via INTRAVENOUS
  Filled 2023-11-19: qty 2

## 2023-11-19 MED ORDER — SUGAMMADEX SODIUM 200 MG/2ML IV SOLN
INTRAVENOUS | Status: DC | PRN
  Administered 2023-11-19: 200 mg via INTRAVENOUS

## 2023-11-19 MED ORDER — LACTATED RINGERS IV SOLN: INTRAVENOUS | Status: DC | PRN

## 2023-11-19 MED ORDER — ORAL CARE MOUTH RINSE: 15.0000 mL | Freq: Once | OROMUCOSAL | Status: AC

## 2023-11-19 NOTE — Progress Notes (Signed)
 Dr. Philip made aware of patient's PT- 16.0 and platelet count 73. Ok per Dr. Philip. No new orders received.

## 2023-11-19 NOTE — Progress Notes (Signed)
   11/19/23 1451  Vitals  Temp 98.1 F (36.7 C)  Temp Source Oral  BP (!) 103/51  MAP (mmHg) 67  BP Location Right Arm  BP Method Automatic  Patient Position (if appropriate) Lying  Pulse Rate 86  Pulse Rate Source Monitor  ECG Heart Rate 87  Resp 13  Level of Consciousness  Level of Consciousness Alert  MEWS COLOR  MEWS Score Color Green  Oxygen Therapy  SpO2 96 %  O2 Device Room Air  MEWS Score  MEWS Temp 0  MEWS Systolic 0  MEWS Pulse 0  MEWS RR 1  MEWS LOC 0  MEWS Score 1   Pt on unit from PACU, a&o x4, VSS, dressings C/D/I, belongings and son at bedside, call light within reach, skin assessment completed with Vina, RN.

## 2023-11-19 NOTE — Progress Notes (Signed)
 Pt has scheduled spirolactone and pt BP is 103/51. MD made aware and stated to hold spirolactone for the day.

## 2023-11-19 NOTE — Procedures (Addendum)
 Interventional Radiology Procedure:   Indications: Cirrhosis with recurrent large volume ascites  Procedure: TIPS and paracentesis  Findings: Successful TIPS procedure.  Pre-TIPS portal gradient was 29 mmHg.  Post-TIPS gradient 6 mmHg.  Removed 5 liters of ascites.   Complications: None     EBL: Minimal  Plan:  Bedrest 2 hours.  Keep overnight for observation.  Labs in am.  Advance diet as tolerated.    Hanan Moen R. Philip, MD  Pager: [phone]

## 2023-11-19 NOTE — Progress Notes (Addendum)
 Supervising Physician: Philip Cornet  Patient Status:  [facility] - In-pt  Chief Complaint:  S/p TIPS and paracentesis 11/19/23 with Dr. Philip  Subjective:  Patient is sitting up in bed eating from meal tray. Endorses no change in chronic RLQ pain, no new pain related to procedure today. Denies nausea. Urinary catheter already removed. She is in good spirits and is spending time with visitors at time of interview.   Allergies: Pramipexole, Crestor [rosuvastatin calcium ], Bee pollen, and Pollen extract  Medications: Prior to Admission medications   Medication Sig Start Date End Date Taking? Authorizing Provider  acetaminophen  (TYLENOL ) 500 MG tablet Take 1,000 mg by mouth 2 (two) times daily as needed for moderate pain or headache.   Yes [provider]  buPROPion  (WELLBUTRIN  XL) 150 MG 24 hr tablet Take 1 tablet (150 mg total) by mouth every morning. 11/03/23 11/02/24 Yes Okey Barnie SAUNDERS, MD  Cholecalciferol (VITAMIN D3) 50 MCG (2000 UT) TABS Take 2,000 Units by mouth daily.    Yes [provider]  diazepam  (VALIUM ) 2 MG tablet Take 1 tablet (2 mg total) by mouth daily as needed for anxiety. 11/03/23 11/02/24 Yes Okey Barnie SAUNDERS, MD  estrogens , conjugated, (PREMARIN ) 0.625 MG tablet Take 0.625 mg by mouth daily.   Yes [provider]  ezetimibe (ZETIA) 10 MG tablet Take 10 mg by mouth at bedtime. 10/19/19  Yes [provider]  fluticasone (FLONASE) 50 MCG/ACT nasal spray Place 2 sprays into both nostrils daily. 09/03/22  Yes [provider]  furosemide  (LASIX ) 40 MG tablet Take 1 tablet (40 mg total) by mouth daily. 06/09/23 06/08/24 Yes Mahon, Charmaine CROME, NP  hyoscyamine  (LEVSIN  SL) 0.125 MG SL tablet Place 1 tablet (0.125 mg total) under the tongue every 6 (six) hours as needed for cramping. 09/17/22  Yes Shirlean Therisa ORN, NP  Lactobacillus-Inulin (CULTURELLE DIGESTIVE HEALTH) CAPS Take 1 capsule by mouth daily. 11/10/21  Yes [provider]   mirtazapine  (REMERON ) 15 MG tablet Take 1 tablet (15 mg total) by mouth at bedtime. 11/03/23  Yes Okey Barnie SAUNDERS, MD  omeprazole  (PRILOSEC) 40 MG capsule TAKE ONE (1) CAPSULE BY MOUTH TWICE DAILY AT [facility] AND AT BEDTIME 07/22/23  Yes Mahon, Charmaine CROME, NP  ondansetron  (ZOFRAN ) 4 MG tablet TAKE 1 TABLET BY MOUTH EVERY 8 HOURS AS NEEDED FOR NAUSEA & VOMITING 12/30/22  Yes Kennedy Charmaine CROME, NP  Pancrelipase , Lip-Prot-Amyl, (ZENPEP ) 60000-189600 units CPEP Take 1 capsule by mouth 3 (three) times daily with meals. 02/01/23  Yes Mahon, Charmaine CROME, NP  propranolol  ER (INDERAL  LA) 80 MG 24 hr capsule Take 1 capsule (80 mg total) by mouth daily. 11/03/23 11/02/24 Yes Okey Barnie SAUNDERS, MD  spironolactone  (ALDACTONE ) 100 MG tablet Take 1 tablet (100 mg total) by mouth daily. 06/09/23 06/08/24 Yes Mahon, Charmaine CROME, NP  cetirizine (ZYRTEC) 10 MG tablet Take 10 mg by mouth daily. Aller-Tec Patient not taking: Reported on 11/16/2023    [provider]  trimethoprim  (TRIMPEX ) 100 MG tablet TAKE 1 TABLET BY MOUTH EVERY DAY AT BEDTIME Patient not taking: Reported on 11/16/2023 06/29/23   Gerldine Lauraine BROCKS, FNP     Vital Signs: BP (!) 103/51 (BP Location: Right Arm)   Pulse 86   Temp 98.1 F (36.7 C) (Oral)   Resp 13   Ht 5' 6 (1.676 m)   Wt 155 lb (70.3 kg)   SpO2 96%   BMI 25.02 kg/m   Physical Exam Cardiovascular:     Rate  and Rhythm: Normal rate and regular rhythm.  Pulmonary:     Effort: Pulmonary effort is normal.  Abdominal:     Palpations: Abdomen is soft.  Skin:    General: Skin is warm and dry.     Comments: No concerns at puncture sites from procedure today  Neurological:     Mental Status: She is alert.  Psychiatric:        Mood and Affect: Mood normal.        Behavior: Behavior normal.     Imaging: US  Paracentesis Result Date: 11/17/2023 INDICATION: Patient with a history of cirrhosis with recurrent ascites. Interventional Radiology asked to perform a diagnostic and  therapeutic paracentesis. 5 L max EXAM: ULTRASOUND GUIDED PARACENTESIS MEDICATIONS: 1% lidocaine  10 mL COMPLICATIONS: None immediate. PROCEDURE: Informed written consent was obtained from the patient after a discussion of the risks, benefits and alternatives to treatment. A timeout was performed prior to the initiation of the procedure. Initial ultrasound scanning demonstrates a large amount of ascites within the left lower abdominal quadrant. The left lower abdomen was prepped and draped in the usual sterile fashion. 1% lidocaine  was used for local anesthesia. Following this, a 19 gauge, 7-cm, Yueh catheter was introduced. An ultrasound image was saved for documentation purposes. The paracentesis was performed. The catheter was removed and a dressing was applied. The patient tolerated the procedure well without immediate post procedural complication. Patient received post-procedure intravenous albumin ; see nursing notes for details. FINDINGS: A total of approximately 5 L of clear yellow fluid was removed. Samples were sent to the laboratory as requested by the clinical team. IMPRESSION: Successful ultrasound-guided paracentesis yielding 5 liters of peritoneal fluid. Procedure performed by: Warren Dais, NP PLAN: The patient is scheduled for TIPS procedure November 19, 2023 Electronically Signed   By: Ester Sides M.D.   On: 11/17/2023 11:33    Labs:  CBC: Recent Labs    06/09/23 0933 07/28/23 1313 09/08/23 1028 11/19/23 0630 11/19/23 0857 11/19/23 1033  WBC 4.3 3.9* 4.4 3.2*  --   --   HGB 11.3* 11.1* 11.7* 10.8* 8.5* 7.8*  HCT 35.7* 35.3* 37.6 33.1* 25.0* 23.0*  PLT 76* 67* 84* 73*  --   --     COAGS: Recent Labs    06/09/23 0933 07/28/23 1313 09/08/23 1028 11/19/23 0630  INR 1.2 1.2 1.2 1.2    BMP: Recent Labs    06/09/23 0933 07/28/23 1313 09/08/23 1028 11/19/23 0630 11/19/23 0857 11/19/23 1033  NA 138 135 138 140 146* 145  K 4.0 4.8 4.7 4.0 3.7 3.9  CL 103 104 107 107   --   --   CO2 24 20* 22 22  --   --   GLUCOSE 147* 192* 114* 149*  --   --   BUN 14 14 15 11   --   --   CALCIUM  9.0 8.5* 8.7* 8.8*  --   --   CREATININE 1.20* 1.22* 1.11* 1.31*  --   --   GFRNONAA 48* 47* 53* 43*  --   --     LIVER FUNCTION TESTS: Recent Labs    06/09/23 0933 07/28/23 1313 09/08/23 1028 11/19/23 0630  BILITOT 1.6* 1.0 1.4* 1.9*  AST 19 27 31 24   ALT 12 12 15 14   ALKPHOS 106 108 147* 112  PROT 7.5 6.7 6.9 6.6  ALBUMIN  3.5 3.1* 3.4* 3.5    Assessment and Plan:  S/p 11/7 TIPS with Dr. Philip under general anesthesia - VSS - CBC,  CMP, INR in AM - start lactulose 30 mg TID tomorrow; patient may titrate as needed at home - goals for BM, tolerating PO intake, ambulating prior to DC. Goal discharge tomorrow pending above - Will receive a phone call within 2 weeks after DC. F/u in clinic in 1 month with Dr. Philip and again in 6 mo. Recommend GI appt approx 1 week from DC (will notify her GI office). - Family involved in planning at bedside, all questions addressed.      Electronically Signed: Laymon Coast, NP 11/19/2023, 3:33 PM   I spent a total of 15 Minutes at the the patient's bedside AND on the patient's hospital floor or unit, greater than 50% of which was counseling/coordinating care for s/p TIPS

## 2023-11-19 NOTE — Anesthesia Procedure Notes (Signed)
 Procedure Name: Intubation Date/Time: 11/19/2023 8:41 AM  Performed by: Leopoldo Wanda DASEN, CRNAPre-anesthesia Checklist: Patient identified, Emergency Drugs available, Suction available and Patient being monitored Patient Re-evaluated:Patient Re-evaluated prior to induction Oxygen Delivery Method: Circle System Utilized Preoxygenation: Pre-oxygenation with 100% oxygen Induction Type: IV induction Ventilation: Mask ventilation without difficulty Laryngoscope Size: Miller and 2 Grade View: Grade I Tube type: Oral Tube size: 7.0 mm Number of attempts: 1 Airway Equipment and Method: Stylet and Oral airway Placement Confirmation: ETT inserted through vocal cords under direct vision, positive ETCO2 and breath sounds checked- equal and bilateral Secured at: 20 cm Tube secured with: Tape Dental Injury: Teeth and Oropharynx as per pre-operative assessment

## 2023-11-19 NOTE — Anesthesia Postprocedure Evaluation (Signed)
 Anesthesia Post Note  Patient: Andrea Dickson  Procedure(s) Performed: INSERTION, SHUNT, INTRAHEPATIC PORTOSYSTEMIC, TRANSJUGULAR     Patient location during evaluation: PACU Anesthesia Type: General Level of consciousness: awake and alert Pain management: pain level controlled Vital Signs Assessment: post-procedure vital signs reviewed and stable Respiratory status: spontaneous breathing, nonlabored ventilation, respiratory function stable and patient connected to nasal cannula oxygen Cardiovascular status: blood pressure returned to baseline and stable Postop Assessment: no apparent nausea or vomiting Anesthetic complications: no   No notable events documented.  Last Vitals:  Vitals:   11/19/23 1330 11/19/23 1345  BP: (!) 106/48 (!) 102/52  Pulse: 90 94  Resp: (!) 23 (!) 22  Temp:    SpO2: 94% 93%    Last Pain:  Vitals:   11/19/23 1300  PainSc: Asleep                 Epifanio Lamar BRAVO

## 2023-11-19 NOTE — Anesthesia Preprocedure Evaluation (Signed)
 Anesthesia Evaluation  Patient identified by MRN, date of birth, ID band Patient awake    Reviewed: Allergy & Precautions, NPO status , Patient's Chart, lab work & pertinent test results  Airway Mallampati: II  TM Distance: >3 FB Neck ROM: Full    Dental  (+) Dental Advisory Given   Pulmonary asthma , former smoker   breath sounds clear to auscultation       Cardiovascular negative cardio ROS  Rhythm:Regular Rate:Normal  IMPRESSIONS      1. Left ventricular ejection fraction, by estimation, is 70 to 75%. The left ventricle has hyperdynamic function. The left ventricle has no regional wall motion abnormalities. There is mild left ventricular hypertrophy. Left ventricular diastolic parameters are consistent with Grade I diastolic dysfunction (impaired relaxation). Elevated left atrial pressure.  2. Right ventricular systolic function is normal. The right ventricular size is normal. Tricuspid regurgitation signal is inadequate for assessing PA pressure.  3. The mitral valve is normal in structure. No evidence of mitral valve regurgitation. No evidence of mitral stenosis.  4. The aortic valve is tricuspid. There is mild calcification of the aortic valve. There is mild thickening of the aortic valve. Aortic valve regurgitation is not visualized. No aortic stenosis is present.  5. The inferior vena cava is normal in size with greater than 50% respiratory variability, suggesting right atrial pressure of 3 mmHg.    Neuro/Psych  Headaches    GI/Hepatic ,GERD  ,,(+) Cirrhosis   Esophageal Varices and ascites      Endo/Other  diabetes, Type 2    Renal/GU negative Renal ROS     Musculoskeletal   Abdominal   Peds  Hematology negative hematology ROS (+)   Anesthesia Other Findings   Reproductive/Obstetrics                              Anesthesia Physical Anesthesia Plan  ASA: 3  Anesthesia Plan:  General   Post-op Pain Management:    Induction: Intravenous  PONV Risk Score and Plan: 3 and Dexamethasone, Ondansetron  and Treatment may vary due to age or medical condition  Airway Management Planned: Oral ETT  Additional Equipment: Arterial line  Intra-op Plan:   Post-operative Plan: Extubation in OR  Informed Consent: I have reviewed the patients History and Physical, chart, labs and discussed the procedure including the risks, benefits and alternatives for the proposed anesthesia with the patient or authorized representative who has indicated his/her understanding and acceptance.     Dental advisory given  Plan Discussed with: CRNA  Anesthesia Plan Comments:         Anesthesia Quick Evaluation

## 2023-11-19 NOTE — H&P (Deleted)
   The note originally documented on this encounter has been moved the the encounter in which it belongs.

## 2023-11-19 NOTE — Transfer of Care (Signed)
 Immediate Anesthesia Transfer of Care Note  Patient: Andrea Dickson  Procedure(s) Performed: INSERTION, SHUNT, INTRAHEPATIC PORTOSYSTEMIC, TRANSJUGULAR  Patient Location: PACU  Anesthesia Type:General  Level of Consciousness: awake, alert , oriented, patient cooperative, and responds to stimulation  Airway & Oxygen Therapy: Patient Spontanous Breathing  Post-op Assessment: Report given to RN, Post -op Vital signs reviewed and stable, and Patient moving all extremities X 4  Post vital signs: Reviewed and stable  Last Vitals:  Vitals Value Taken Time  BP 101/48 11/19/23 12:00  Temp 36.5 C 11/19/23 12:00  Pulse 89 11/19/23 12:05  Resp 14 11/19/23 12:05  SpO2 95 % 11/19/23 12:05  Vitals shown include unfiled device data.  Last Pain:  Vitals:   11/19/23 0607  PainSc: 0-No pain      Patients Stated Pain Goal: 0 (11/19/23 9392)  Complications: No notable events documented.

## 2023-11-19 NOTE — H&P (Signed)
 Chief Complaint: Decompensated Cirrhosis  Referring Provider(s): Henn,Adam   Supervising Physician: Philip Cornet  Patient Status: Andrea Dickson - In-pt  History of Present Illness: Andrea Dickson is a 73 y.o. female with PMH significant for decompensated cirrhosis (presumably MASH), DM, GERD, HLD, Bipolar 1. She has been experiencing ascites and there is evidence of splenomegaly on imaging.  Patient underwent upper endoscopy on 04/19/2023 which demonstrated grade 2 esophageal varices with no bleeding and no stigmata of recent bleeding and portal gastropathy.  She was seen in clinic last 04/28/23 with discussion about her candidacy for TIPS. At that time, she desired to hold procedure. Since then, she has made decision to proceed and presents for TIPS today. CT 07/26/23 demonstrates patent portal vein and hepatic vein. She has continued to require paracenteses with most recent being 11/17/23 (5L), 11/03/23 (5L).  Confirms NPO since MN.  Does not wear CPAP or use supplemental home O2.  Denies fever, chills, SOB, CP, sore throat, emesis, abd pain, blood in stool or urine, back pain. Endorses fatigue, R leg swelling chronic, small bruises to her arms, and nausea (feels related to not eating for procedure, relieved by zofran ).  Allergies Reviewed:  Pramipexole, Crestor [rosuvastatin calcium ], Bee pollen, and Pollen extract   Patient is Full Code  Past Medical History:  Diagnosis Date   Anxiety    Asthma    Bipolar 1 disorder (HCC)    Chronic diarrhea    Cirrhosis (HCC)    Diagnosed September 2020, likely secondary to [facility], s/p Hep A/B vaccination in 2021.    Depression    Diabetes (HCC)    Essential tremor    GERD (gastroesophageal reflux disease)    Headache    High cholesterol     Past Surgical History:  Procedure Laterality Date   ABDOMINAL HYSTERECTOMY  1997   BIOPSY  01/17/2018   Procedure: BIOPSY;  Surgeon: Shaaron Lamar HERO, MD;  [facility];  Service: Endoscopy;;   colon   carpal tunnel Bilateral 1999, 06/2009   COLONOSCOPY  2013   Dr. Donnel: no colon polyps. quality of prep was fair. repeat colonoscopy in five years.    COLONOSCOPY WITH PROPOFOL  N/A 01/17/2018   Dr. Shaaron: Colonic lipoma, 2 tubular adenomas removed.  Random colon biopsies negative.  Next colonoscopy 5 years.   COLONOSCOPY WITH PROPOFOL  N/A 09/26/2022   Procedure: COLONOSCOPY WITH PROPOFOL ;  Surgeon: Eartha Angelia Sieving, MD;  [facility];  Service: Gastroenterology;  Laterality: N/A;   ESOPHAGOGASTRODUODENOSCOPY N/A 04/19/2023   Procedure: EGD (ESOPHAGOGASTRODUODENOSCOPY);  Surgeon: Cindie Carlin POUR, DO;  [facility];  Service: Endoscopy;  Laterality: N/A;  1:45 pm, asa 3   ESOPHAGOGASTRODUODENOSCOPY (EGD) WITH PROPOFOL  N/A 04/06/2019   Procedure: ESOPHAGOGASTRODUODENOSCOPY (EGD) WITH PROPOFOL ;  Surgeon: Shaaron Lamar HERO, MD;   Normal esophagus, mild portal hypertensive gastropathy, otherwise normal exam.  Recommend repeat EGD in 2 years for variceal screening.    ESOPHAGOGASTRODUODENOSCOPY (EGD) WITH PROPOFOL  N/A 01/19/2022   Procedure: ESOPHAGOGASTRODUODENOSCOPY (EGD) WITH PROPOFOL ;  Surgeon: Shaaron Lamar HERO, MD;  [facility];  Service: Endoscopy;  Laterality: N/A;  12:45 pm, pt knows to arrive at 9:15   ESOPHAGOGASTRODUODENOSCOPY (EGD) WITH PROPOFOL  N/A 09/25/2022   Procedure: ESOPHAGOGASTRODUODENOSCOPY (EGD) WITH PROPOFOL ;  Surgeon: Eartha Angelia Sieving, MD;  [facility];  Service: Gastroenterology;  Laterality: N/A;   GIVENS CAPSULE STUDY N/A 06/16/2023   Procedure: IMAGING PROCEDURE, GI TRACT, INTRALUMINAL, VIA CAPSULE;  Surgeon: Cindie Carlin POUR, DO;  [facility];  Service: Endoscopy;  Laterality: N/A;  730am   IR RADIOLOGIST EVAL & MGMT  04/28/2023   POLYPECTOMY  01/17/2018   Procedure: POLYPECTOMY;  Surgeon: Shaaron Lamar HERO, MD;  [facility];  Service: Endoscopy;;  colon   POLYPECTOMY  09/26/2022   Procedure:  POLYPECTOMY;  Surgeon: Eartha Angelia Sieving, MD;  [facility];  Service: Gastroenterology;;   SKIN CANCER EXCISION  2007   TONSILLECTOMY  1965   ulner nerve  Left 06/2009   decompression      Medications: Prior to Admission medications   Medication Sig Start Date End Date Taking? Authorizing Provider  acetaminophen  (TYLENOL ) 500 MG tablet Take 1,000 mg by mouth 2 (two) times daily as needed for moderate pain or headache.    [provider]  buPROPion  (WELLBUTRIN  XL) 150 MG 24 hr tablet Take 1 tablet (150 mg total) by mouth every morning. 11/03/23 11/02/24  Okey Barnie SAUNDERS, MD  cetirizine (ZYRTEC) 10 MG tablet Take 10 mg by mouth daily. Aller-Tec Patient not taking: Reported on 11/16/2023    [provider]  Cholecalciferol (VITAMIN D3) 50 MCG (2000 UT) TABS Take 2,000 Units by mouth daily.     [provider]  diazepam  (VALIUM ) 2 MG tablet Take 1 tablet (2 mg total) by mouth daily as needed for anxiety. 11/03/23 11/02/24  Okey Barnie SAUNDERS, MD  estrogens , conjugated, (PREMARIN ) 0.625 MG tablet Take 0.625 mg by mouth daily.    [provider]  ezetimibe (ZETIA) 10 MG tablet Take 10 mg by mouth at bedtime. 10/19/19   [provider]  fluticasone (FLONASE) 50 MCG/ACT nasal spray Place 2 sprays into both nostrils daily. 09/03/22   [provider]  furosemide  (LASIX ) 40 MG tablet Take 1 tablet (40 mg total) by mouth daily. 06/09/23 06/08/24  Kennedy Charmaine CROME, NP  hyoscyamine  (LEVSIN  SL) 0.125 MG SL tablet Place 1 tablet (0.125 mg total) under the tongue every 6 (six) hours as needed for cramping. 09/17/22   Shirlean Therisa ORN, NP  Lactobacillus-Inulin (CULTURELLE DIGESTIVE HEALTH) CAPS Take 1 capsule by mouth daily. 11/10/21   [provider]  mirtazapine  (REMERON ) 15 MG tablet Take 1 tablet (15 mg total) by mouth at bedtime. 11/03/23   Okey Barnie SAUNDERS, MD  omeprazole  (PRILOSEC) 40 MG capsule TAKE ONE (1) CAPSULE BY MOUTH TWICE  DAILY AT [facility] AND AT BEDTIME 07/22/23   Kennedy Charmaine L, NP  ondansetron  (ZOFRAN ) 4 MG tablet TAKE 1 TABLET BY MOUTH EVERY 8 HOURS AS NEEDED FOR NAUSEA & VOMITING 12/30/22   Kennedy Charmaine CROME, NP  Pancrelipase , Lip-Prot-Amyl, (ZENPEP ) 60000-189600 units CPEP Take 1 capsule by mouth 3 (three) times daily with meals. 02/01/23   Kennedy Charmaine CROME, NP  propranolol  ER (INDERAL  LA) 80 MG 24 hr capsule Take 1 capsule (80 mg total) by mouth daily. 11/03/23 11/02/24  Okey Barnie SAUNDERS, MD  spironolactone  (ALDACTONE ) 100 MG tablet Take 1 tablet (100 mg total) by mouth daily. 06/09/23 06/08/24  Kennedy Charmaine CROME, NP  trimethoprim  (TRIMPEX ) 100 MG tablet TAKE 1 TABLET BY MOUTH EVERY DAY AT BEDTIME Patient not taking: Reported on 11/16/2023 06/29/23   Gerldine Lauraine BROCKS, FNP     Family History  Problem Relation Age of Onset   Diabetes Mother    Brain cancer Mother    Diabetes Father    Heart attack Father    Heart disease Father    Diabetes Sister    Bipolar disorder Brother  Diabetes Brother    Heart disease Brother    Colon cancer Neg Hx    Breast cancer Neg Hx     Social History   Socioeconomic History   Marital status: Divorced    Spouse name: Not on file   Number of children: 1   Years of education: 12   Highest education level: Not on file  Occupational History   Occupation: retired    Comment: employed at [employer]  Tobacco Use   Smoking status: Former    Current packs/day: 0.00    Average packs/day: 0.5 packs/day for 43.0 years (21.5 ttl pk-yrs)    Types: Cigarettes    Quit date: 2024    Years since quitting: 1.8   Smokeless tobacco: Never   Tobacco comments:    smoking since 73yrs old.    Vaping Use   Vaping status: Never Used  Substance and Sexual Activity   Alcohol use: No    Alcohol/week: 0.0 standard drinks of alcohol   Drug use: No   Sexual activity: Not Currently  Other Topics Concern   Not on file  Social History Narrative   Lives alone.  Retired.   Education 12th grade.  One child.     Caffeine use- sodas, 2 daily   Social Drivers of Health   Financial Resource Strain: Low Risk  (11/10/2021)   Received from [facility]   Overall Financial Resource Strain (CARDIA)    Difficulty of Paying Living Expenses: Not hard at all  Food Insecurity: No Food Insecurity (09/25/2022)   Hunger Vital Sign    Worried About Running Out of Food in the Last Year: Never true    Ran Out of Food in the Last Year: Never true  Transportation Needs: No Transportation Needs (09/25/2022)   PRAPARE - Administrator, Civil Service (Medical): No    Lack of Transportation (Non-Medical): No  Physical Activity: Inactive (11/10/2021)   Received from [facility]   Exercise Vital Sign    On average, how many days per week do you engage in moderate to strenuous exercise (like a brisk walk)?: 0 days    On average, how many minutes do you engage in exercise at this level?: 0 min  Stress: No Stress Concern Present (11/10/2021)   Received from [facility] - Occupational Stress Questionnaire    Feeling of Stress : Not at all  Social Connections: Socially Isolated (11/10/2021)   Received from [facility]   Social Connection and Isolation Panel    In a typical week, how many times do you talk on the phone with family, friends, or neighbors?: More than three times a week    How often do you get together with friends or relatives?: Once a week    How often do you attend church or religious services?: Never    Do you belong to any clubs or organizations such as church groups, unions, fraternal or athletic groups, or school groups?: No    How often do you attend meetings of the clubs or organizations you belong to?: Never    Are you married, widowed, divorced, separated, never married, or living with a partner?: Divorced     Review of Systems: A 12 point ROS discussed and pertinent positives are indicated  in the HPI above.  All other systems are negative.    Vital Signs: BP (!) 125/58 (BP Location: Right Arm)   Pulse 79  Temp 98.2 F (36.8 C) (Oral)   Resp 18   SpO2 99%     Physical Exam Constitutional:      Appearance: She is normal weight.  HENT:     Mouth/Throat:     Mouth: Mucous membranes are moist.     Pharynx: Oropharynx is clear.     Comments: Missing teeth upper and lower Cardiovascular:     Rate and Rhythm: Normal rate and regular rhythm.     Pulses: Normal pulses.     Heart sounds: Normal heart sounds.  Pulmonary:     Effort: Pulmonary effort is normal.     Breath sounds: Normal breath sounds.  Abdominal:     General: Bowel sounds are normal. There is distension.     Palpations: Abdomen is soft.     Tenderness: There is abdominal tenderness.     Comments: LLQ tender at site of last puncture Mildly distended  Musculoskeletal:     Right lower leg: Edema present.     Left lower leg: No edema.  Skin:    General: Skin is warm and dry.     Findings: Bruising present.     Comments: Petechial bruising both arms, not present anywhere else  Neurological:     Mental Status: She is alert and oriented to person, place, and time.  Psychiatric:        Mood and Affect: Mood normal.        Behavior: Behavior normal.        Thought Content: Thought content normal.        Judgment: Judgment normal.     Imaging: US  Paracentesis Result Date: 11/17/2023 INDICATION: Patient with a history of cirrhosis with recurrent ascites. Interventional Radiology asked to perform a diagnostic and therapeutic paracentesis. 5 L max EXAM: ULTRASOUND GUIDED PARACENTESIS MEDICATIONS: 1% lidocaine  10 mL COMPLICATIONS: None immediate. PROCEDURE: Informed written consent was obtained from the patient after a discussion of the risks, benefits and alternatives to treatment. A timeout was performed prior to the initiation of the procedure. Initial ultrasound scanning demonstrates a large amount of  ascites within the left lower abdominal quadrant. The left lower abdomen was prepped and draped in the usual sterile fashion. 1% lidocaine  was used for local anesthesia. Following this, a 19 gauge, 7-cm, Yueh catheter was introduced. An ultrasound image was saved for documentation purposes. The paracentesis was performed. The catheter was removed and a dressing was applied. The patient tolerated the procedure well without immediate post procedural complication. Patient received post-procedure intravenous albumin ; see nursing notes for details. FINDINGS: A total of approximately 5 L of clear yellow fluid was removed. Samples were sent to the laboratory as requested by the clinical team. IMPRESSION: Successful ultrasound-guided paracentesis yielding 5 liters of peritoneal fluid. Procedure performed by: Warren Dais, NP PLAN: The patient is scheduled for TIPS procedure November 19, 2023 Electronically Signed   By: Ester Sides M.D.   On: 11/17/2023 11:33   US  Paracentesis Result Date: 11/03/2023 INDICATION: 73 year old female. History of cirrhosis with recurrent ascites. Request is for therapeutic and diagnostic paracentesis. 5 L maximum EXAM: ULTRASOUND GUIDED THERAPEUTIC AND DIAGNOSTIC PARACENTESIS MEDICATIONS: Lidocaine  1% 10 mL COMPLICATIONS: None immediate. PROCEDURE: Informed written consent was obtained from the patient after a discussion of the risks, benefits and alternatives to treatment. A timeout was performed prior to the initiation of the procedure. Initial ultrasound scanning demonstrates a large amount of ascites within the right lower abdominal quadrant. The right lower abdomen was prepped and draped in  the usual sterile fashion. 1% lidocaine  was used for local anesthesia. Following this, a 19 gauge, 7-cm, Yueh catheter was introduced. An ultrasound image was saved for documentation purposes. The paracentesis was performed. The catheter was removed and a dressing was applied. The patient  tolerated the procedure well without immediate post procedural complication. Patient received post-procedure intravenous albumin ; see nursing notes for details. FINDINGS: A total of approximately 5 L of pale yellow fluid was removed. Samples were sent to the laboratory as requested by the clinical team. IMPRESSION: Successful ultrasound-guided therapeutic and diagnostic right-sided paracentesis yielding 5 liters of peritoneal fluid. PLAN: The patient is scheduled for tips procedure November 19, 2023. Electronically Signed   By: Ester Sides M.D.   On: 11/03/2023 12:48   US  Paracentesis Result Date: 10/20/2023 INDICATION: Patient with a history of cirrhosis with recurrent ascites. Interventional Radiology asked to perform a therapeutic and diagnostic paracentesis with 5 L max. EXAM: ULTRASOUND GUIDED PARACENTESIS MEDICATIONS: 1% lidocaine  10 mL COMPLICATIONS: None immediate. PROCEDURE: Informed written consent was obtained from the patient after a discussion of the risks, benefits and alternatives to treatment. A timeout was performed prior to the initiation of the procedure. Initial ultrasound scanning demonstrates a large amount of ascites within the left lower abdominal quadrant. The left lower abdomen was prepped and draped in the usual sterile fashion. 1% lidocaine  was used for local anesthesia. Following this, a 19 gauge, 7-cm, Yueh catheter was introduced. An ultrasound image was saved for documentation purposes. The paracentesis was performed. The catheter was removed and a dressing was applied. The patient tolerated the procedure well without immediate post procedural complication. Patient received post-procedure intravenous albumin ; see nursing notes for details. FINDINGS: A total of approximately 5 L of clear yellow fluid was removed. Samples were sent to the laboratory as requested by the clinical team. IMPRESSION: Successful ultrasound-guided paracentesis yielding 5 liters of peritoneal fluid.  Procedure performed by Warren Dais, NP PLAN: The patient has previously been formally evaluated by the [facility] Interventional Radiology Portal Hypertension Clinic and is being actively followed for potential future intervention. Patient is currently scheduled for TIPS 11/19/2023. Electronically Signed   By: Ester Sides M.D.   On: 10/20/2023 13:02    Labs:  CBC: Recent Labs    06/09/23 0933 07/28/23 1313 09/08/23 1028 11/19/23 0630  WBC 4.3 3.9* 4.4 3.2*  HGB 11.3* 11.1* 11.7* 10.8*  HCT 35.7* 35.3* 37.6 33.1*  PLT 76* 67* 84* 73*    COAGS: Recent Labs    06/09/23 0933 07/28/23 1313 09/08/23 1028 11/19/23 0630  INR 1.2 1.2 1.2 1.2    BMP: Recent Labs    06/09/23 0933 07/28/23 1313 09/08/23 1028 11/19/23 0630  NA 138 135 138 140  K 4.0 4.8 4.7 4.0  CL 103 104 107 107  CO2 24 20* 22 22  GLUCOSE 147* 192* 114* 149*  BUN 14 14 15 11   CALCIUM  9.0 8.5* 8.7* 8.8*  CREATININE 1.20* 1.22* 1.11* 1.31*  GFRNONAA 48* 47* 53* 43*    LIVER FUNCTION TESTS: Recent Labs    06/09/23 0933 07/28/23 1313 09/08/23 1028 11/19/23 0630  BILITOT 1.6* 1.0 1.4* 1.9*  AST 19 27 31 24   ALT 12 12 15 14   ALKPHOS 106 108 147* 112  PROT 7.5 6.7 6.9 6.6  ALBUMIN  3.5 3.1* 3.4* 3.5    TUMOR MARKERS: No results for input(s): AFPTM, CEA, CA199, CHROMGRNA in the last 8760 hours.  Assessment and Plan:  Patient presenting for planned TIPS with Dr. Philip.  No contraindications for procedure identified in ROS, physical exam, or review of pre-sedation considerations. Labs reviewed and within acceptable range - MELD worsened today (15) from previous clinic visit (13). PT 16.0 and platelet count 73. Creat 1.31.  07/26/23 imaging available and reviewed VSS, afebrile Patient does not take a blood thinner Will plan for paracentesis prior to starting procedure.     Risks and benefits of TIPS and/or additional variceal embolization were discussed with the patient and/or the  patient's family including, but not limited to, infection, bleeding, damage to adjacent structures, worsening hepatic and/or cardiac function, worsening and/or the development of altered mental status/encephalopathy, non-target embolization and death.   This interventional procedure involves the use of X-rays and because of the nature of the planned procedure, it is possible that we will have prolonged use of X-ray fluoroscopy.  Potential radiation risks to you include (but are not limited to) the following: - A slightly elevated risk for cancer several years later in life. This risk is typically less than 0.5% percent. This risk is low in comparison to the normal incidence of human cancer, which is 33% for women and 50% for men according to the American Cancer Society. - Radiation induced injury can include skin redness, resembling a rash, tissue breakdown / ulcers and hair loss (which can be temporary or permanent).   The likelihood of either of these occurring depends on the difficulty of the procedure and whether you are sensitive to radiation due to previous procedures, disease, or genetic conditions.   IF your procedure requires a prolonged use of radiation, you will be notified and given written instructions for further action.  It is your responsibility to monitor the irradiated area for the 2 weeks following the procedure and to notify your physician if you are concerned that you have suffered a radiation induced injury.    All of the patient's questions were answered, patient is agreeable to proceed.  Consent signed and in chart.    Thank you for allowing our service to participate in Andrea Dickson 's care.    Electronically Signed: Laymon Coast, NP   11/19/2023, 8:04 AM     I spent a total of  30 Minutes   in face to face in clinical consultation, greater than 50% of which was counseling/coordinating care for TIPS creation.   (A copy of this note was sent to the  referring provider and the time of visit.)

## 2023-11-19 NOTE — Progress Notes (Signed)
 Per MD, urinary foley catheter removed.

## 2023-11-20 LAB — COMPREHENSIVE METABOLIC PANEL WITH GFR
ALT: 11 U/L (ref 0–44)
AST: 31 U/L (ref 15–41)
Albumin: 3.4 g/dL — ABNORMAL LOW (ref 3.5–5.0)
Alkaline Phosphatase: 63 U/L (ref 38–126)
Anion gap: 12 (ref 5–15)
BUN: 15 mg/dL (ref 8–23)
CO2: 16 mmol/L — ABNORMAL LOW (ref 22–32)
Calcium: 8.4 mg/dL — ABNORMAL LOW (ref 8.9–10.3)
Chloride: 108 mmol/L (ref 98–111)
Creatinine, Ser: 1.41 mg/dL — ABNORMAL HIGH (ref 0.44–1.00)
GFR, Estimated: 40 mL/min — ABNORMAL LOW (ref >60)
Glucose, Bld: 194 mg/dL — ABNORMAL HIGH (ref 70–99)
Potassium: 4.2 mmol/L (ref 3.5–5.1)
Sodium: 136 mmol/L (ref 135–145)
Total Bilirubin: 1.3 mg/dL — ABNORMAL HIGH (ref 0.0–1.2)
Total Protein: 5.4 g/dL — ABNORMAL LOW (ref 6.5–8.1)

## 2023-11-20 LAB — CBC
HCT: 26.2 % — ABNORMAL LOW (ref 36.0–46.0)
Hemoglobin: 8.5 g/dL — ABNORMAL LOW (ref 12.0–15.0)
MCH: 29.3 pg (ref 26.0–34.0)
MCHC: 32.4 g/dL (ref 30.0–36.0)
MCV: 90.3 fL (ref 80.0–100.0)
Platelets: 60 K/uL — ABNORMAL LOW (ref 150–400)
RBC: 2.9 MIL/uL — ABNORMAL LOW (ref 3.87–5.11)
RDW: 15.9 % — ABNORMAL HIGH (ref 11.5–15.5)
WBC: 3.7 K/uL — ABNORMAL LOW (ref 4.0–10.5)
nRBC: 0 % (ref 0.0–0.2)

## 2023-11-20 LAB — PROTIME-INR
INR: 1.4 — ABNORMAL HIGH (ref 0.8–1.2)
Prothrombin Time: 18 s — ABNORMAL HIGH (ref 11.4–15.2)

## 2023-11-20 MED ORDER — LACTULOSE 10 GM/15ML PO SOLN: 30.0000 g | Freq: Three times a day (TID) | ORAL | 0 refills | Status: DC

## 2023-11-20 NOTE — Progress Notes (Signed)
 Patient noted with low BP, On call CRNA was notified. Day shift RN notified as well to follow up.

## 2023-11-20 NOTE — Plan of Care (Signed)

## 2023-11-20 NOTE — Discharge Summary (Addendum)
 Physician Discharge Summary  Patient ID: Andrea Dickson MRN: [id number] DOB/AGE: September 15, 1950 73 y.o.  Admit date: 11/19/2023 Discharge date: 11/20/2023  Admission Diagnoses: Cirrhosis with recurrent large volume ascites s/p TIPS (transjugular intrahepatic portosystemic shunt)  Discharge Diagnoses:  Principal Problem:   S/P TIPS (transjugular intrahepatic portosystemic shunt)  Discharged Condition: good  Discharge Exam: Blood pressure (!) 105/51, pulse 81, temperature 97.8 F (36.6 C), temperature source Oral, resp. rate (!) 21, height 5' 6 (1.676 m), weight 155 lb (70.3 kg), SpO2 96%.  Physical Exam Constitutional:      Appearance: Normal appearance.  Neck:     Comments: Right transjugular access site minimally tender, without evidence of infection, bleeding, or underlying pseudoaneurysm. Cardiovascular:     Rate and Rhythm: Normal rate and regular rhythm.     Pulses: Normal pulses.  Pulmonary:     Effort: Pulmonary effort is normal.     Breath sounds: Normal breath sounds.  Abdominal:     Palpations: Abdomen is soft.     Tenderness: There is no abdominal tenderness.     Comments: Active bowel sounds. No generalized nor focal tenderness to palpation.  Musculoskeletal:        General: Normal range of motion.     Cervical back: Normal range of motion.     Comments: Patient able to ambulate per baseline.  Skin:    General: Skin is warm and dry.     Comments: Right femoral access site minimally tender, without evidence of infection, bleeding, or underlying pseudoaneurysm.  Neurological:     General: No focal deficit present.     Mental Status: She is alert and oriented to person, place, and time.  Psychiatric:        Mood and Affect: Mood normal.        Behavior: Behavior normal.        Thought Content: Thought content normal.        Judgment: Judgment normal.     Assessment:   S/p 11/7 TIPS with Dr. Philip under general anesthesia - VSS - CBC, CMP, INR are  acceptable today. Will repeat in AM.  - Continue lactulose 30 mg TID tomorrow until discharged.  - Patient has had 1 successful BM today.  - She is tolerating PO intake on a regular diet.  - Patient has ambulating today, per baseline. Per patient report, she feels that she is ambulating better than baseline. Patient states I feel like a new person.  Plan:  - Patient is unable to be picked up tonight. Per Unit Charge Nurse, patient may stay overnight, and discharge home in AM when family is available to come get patient. No family is available to stay with patient overnight. - Continue lactulose. It has been prescribed to your pharmacy on file. Patient may titrate as needed at home. - Restart your home medications as previously prescribed. - Patient will receive a phone call within 2 weeks after discharge to schedule a follow-up outpatient appointment in clinic in 1 month with Dr. Philip, and again in 6 mo.  - Recommend GI appt approx 1 week from discharge. Patient states she has this appointment made. - All questions addressed.   Disposition: Discharge to home.    Allergies as of 11/20/2023       Reactions   Pramipexole Other (See Comments)   Shaking, palpitations, headache, faint feeling   Crestor [rosuvastatin Calcium ]    Muscle pain   Bee Pollen Other (See Comments)   Eyes and nose run  Pollen Extract Other (See Comments)   Eyes and nose run        Medication List     TAKE these medications    acetaminophen  500 MG tablet Commonly known as: TYLENOL  Take 1,000 mg by mouth 2 (two) times daily as needed for moderate pain or headache.   buPROPion  150 MG 24 hr tablet Commonly known as: Wellbutrin  XL Take 1 tablet (150 mg total) by mouth every morning.   cetirizine 10 MG tablet Commonly known as: ZYRTEC Take 10 mg by mouth daily. Aller-Tec   Culturelle Digestive Health Caps Take 1 capsule by mouth daily.   diazepam  2 MG tablet Commonly known as: Valium  Take 1 tablet  (2 mg total) by mouth daily as needed for anxiety.   estrogens  (conjugated) 0.625 MG tablet Commonly known as: PREMARIN  Take 0.625 mg by mouth daily.   ezetimibe 10 MG tablet Commonly known as: ZETIA Take 10 mg by mouth at bedtime.   fluticasone 50 MCG/ACT nasal spray Commonly known as: FLONASE Place 2 sprays into both nostrils daily.   furosemide  40 MG tablet Commonly known as: Lasix  Take 1 tablet (40 mg total) by mouth daily.   hyoscyamine  0.125 MG SL tablet Commonly known as: LEVSIN  SL Place 1 tablet (0.125 mg total) under the tongue every 6 (six) hours as needed for cramping.   lactulose 10 GM/15ML solution Commonly known as: CHRONULAC Take 45 mLs (30 g total) by mouth 3 (three) times daily.   mirtazapine  15 MG tablet Commonly known as: REMERON  Take 1 tablet (15 mg total) by mouth at bedtime.   omeprazole  40 MG capsule Commonly known as: PRILOSEC TAKE ONE (1) CAPSULE BY MOUTH TWICE DAILY AT BREAKFAST AND AT BEDTIME   ondansetron  4 MG tablet Commonly known as: ZOFRAN  TAKE 1 TABLET BY MOUTH EVERY 8 HOURS AS NEEDED FOR NAUSEA & VOMITING   propranolol  ER 80 MG 24 hr capsule Commonly known as: Inderal  LA Take 1 capsule (80 mg total) by mouth daily.   spironolactone  100 MG tablet Commonly known as: Aldactone  Take 1 tablet (100 mg total) by mouth daily.   trimethoprim  100 MG tablet Commonly known as: TRIMPEX  TAKE 1 TABLET BY MOUTH EVERY DAY AT BEDTIME   Vitamin D3 50 MCG (2000 UT) Tabs Take 2,000 Units by mouth daily.   Zenpep  39999-810399 units Cpep Generic drug: Pancrelipase  (Lip-Prot-Amyl) Take 1 capsule by mouth 3 (three) times daily with meals.        Follow-up Information     [facility]. Schedule an appointment as soon as possible for a visit in 1 month(s).   Specialty: Radiology Why: Radiology scheduler will contact you to schedule a follow-up appointment in 1 month with Dr. Philip in clinic. Contact information: [address] [place]  [address] [phone]                Signed: Carlin DELENA Griffon 11/20/2023, 5:03 PM

## 2023-11-21 ENCOUNTER — Inpatient Hospital Stay (HOSPITAL_COMMUNITY)

## 2023-11-21 DIAGNOSIS — K7581 Nonalcoholic steatohepatitis (NASH): Secondary | ICD-10-CM

## 2023-11-21 DIAGNOSIS — F3110 Bipolar disorder, current episode manic without psychotic features, unspecified: Secondary | ICD-10-CM

## 2023-11-21 DIAGNOSIS — K219 Gastro-esophageal reflux disease without esophagitis: Secondary | ICD-10-CM

## 2023-11-21 DIAGNOSIS — K7469 Other cirrhosis of liver: Secondary | ICD-10-CM

## 2023-11-21 DIAGNOSIS — I5031 Acute diastolic (congestive) heart failure: Secondary | ICD-10-CM

## 2023-11-21 DIAGNOSIS — Z95828 Presence of other vascular implants and grafts: Secondary | ICD-10-CM

## 2023-11-21 DIAGNOSIS — I5033 Acute on chronic diastolic (congestive) heart failure: Secondary | ICD-10-CM | POA: Diagnosis not present

## 2023-11-21 DIAGNOSIS — R0609 Other forms of dyspnea: Secondary | ICD-10-CM

## 2023-11-21 DIAGNOSIS — E782 Mixed hyperlipidemia: Secondary | ICD-10-CM

## 2023-11-21 DIAGNOSIS — N179 Acute kidney failure, unspecified: Secondary | ICD-10-CM | POA: Insufficient documentation

## 2023-11-21 DIAGNOSIS — R079 Chest pain, unspecified: Secondary | ICD-10-CM

## 2023-11-21 LAB — CBC
HCT: 28.5 % — ABNORMAL LOW (ref 36.0–46.0)
Hemoglobin: 9.4 g/dL — ABNORMAL LOW (ref 12.0–15.0)
MCH: 29.4 pg (ref 26.0–34.0)
MCHC: 33 g/dL (ref 30.0–36.0)
MCV: 89.1 fL (ref 80.0–100.0)
Platelets: 59 K/uL — ABNORMAL LOW (ref 150–400)
RBC: 3.2 MIL/uL — ABNORMAL LOW (ref 3.87–5.11)
RDW: 16.7 % — ABNORMAL HIGH (ref 11.5–15.5)
WBC: 5.2 K/uL (ref 4.0–10.5)
nRBC: 0 % (ref 0.0–0.2)

## 2023-11-21 LAB — ECHOCARDIOGRAM COMPLETE
AR max vel: 1.85 cm2
AV Area VTI: 2.74 cm2
AV Area mean vel: 2.22 cm2
AV Mean grad: 6.1 mmHg
AV Peak grad: 13.7 mmHg
Ao pk vel: 1.85 m/s
Area-P 1/2: 3.89 cm2
Height: 66 in
S' Lateral: 2.3 cm
Weight: 2480 [oz_av]

## 2023-11-21 LAB — PROTIME-INR
INR: 1.5 — ABNORMAL HIGH (ref 0.8–1.2)
Prothrombin Time: 18.5 s — ABNORMAL HIGH (ref 11.4–15.2)

## 2023-11-21 LAB — COMPREHENSIVE METABOLIC PANEL WITH GFR
ALT: 27 U/L (ref 0–44)
AST: 58 U/L — ABNORMAL HIGH (ref 15–41)
Albumin: 3.5 g/dL (ref 3.5–5.0)
Alkaline Phosphatase: 87 U/L (ref 38–126)
Anion gap: 10 (ref 5–15)
BUN: 16 mg/dL (ref 8–23)
CO2: 20 mmol/L — ABNORMAL LOW (ref 22–32)
Calcium: 8.9 mg/dL (ref 8.9–10.3)
Chloride: 109 mmol/L (ref 98–111)
Creatinine, Ser: 1.61 mg/dL — ABNORMAL HIGH (ref 0.44–1.00)
GFR, Estimated: 34 mL/min — ABNORMAL LOW (ref >60)
Glucose, Bld: 196 mg/dL — ABNORMAL HIGH (ref 70–99)
Potassium: 3.5 mmol/L (ref 3.5–5.1)
Sodium: 139 mmol/L (ref 135–145)
Total Bilirubin: 1.5 mg/dL — ABNORMAL HIGH (ref 0.0–1.2)
Total Protein: 5.6 g/dL — ABNORMAL LOW (ref 6.5–8.1)

## 2023-11-21 LAB — TROPONIN I (HIGH SENSITIVITY)
Troponin I (High Sensitivity): 41 ng/L — ABNORMAL HIGH (ref <18)
Troponin I (High Sensitivity): 35 ng/L — ABNORMAL HIGH (ref <18)

## 2023-11-21 LAB — BRAIN NATRIURETIC PEPTIDE: B Natriuretic Peptide: 1472.3 pg/mL — ABNORMAL HIGH (ref 0.0–100.0)

## 2023-11-21 MED ORDER — FUROSEMIDE 10 MG/ML IJ SOLN
40.0000 mg | Freq: Two times a day (BID) | INTRAMUSCULAR | Status: DC
  Administered 2023-11-21 – 2023-11-22 (×2): 40 mg via INTRAVENOUS
  Filled 2023-11-21 (×2): qty 4

## 2023-11-21 MED ORDER — PANCRELIPASE (LIP-PROT-AMYL) 36000-114000 UNITS PO CPEP
60000.0000 [IU] | ORAL_CAPSULE | Freq: Three times a day (TID) | ORAL | Status: DC
  Administered 2023-11-21 – 2023-11-23 (×6): 60000 [IU] via ORAL
  Filled 2023-11-21 (×7): qty 2

## 2023-11-21 MED ORDER — FUROSEMIDE 10 MG/ML IJ SOLN
20.0000 mg | Freq: Once | INTRAMUSCULAR | Status: AC
  Administered 2023-11-21: 20 mg via INTRAVENOUS
  Filled 2023-11-21: qty 2

## 2023-11-21 MED ORDER — PANCRELIPASE (LIP-PROT-AMYL) 60000-189600 UNITS PO CPEP: 1.0000 | ORAL_CAPSULE | Freq: Three times a day (TID) | ORAL | Status: DC

## 2023-11-21 NOTE — Progress Notes (Incomplete)
 Attending Note   Patient seen and discussed with PA Darryle, I agree with his documentation. 73 yo female history of cirrhosis, DM, GERD, HLD, bipolar, esopahgeal varices, presented for outpatient elective TIPs procedure. Post procedure onset of SOB and chest pain. DOE with walking back and forth to the bathroom that has progressed. Chest pain midchest lasting 2 hours constant with some variation in severity but constant over the 2 hours.     K 3.5 BUN 16 Cr 1.61 INR 1.5 WBC 5.2 Hgb 9.4 Plt 59 BNP 1472  Trop 41--> EKG SR, no acute ischemic changes CXR trace left pleural effusion   1.SOB/Chest pain - signs of fluid overload, acute pulmonary edema following TIPs procedure. Pulmonary edema is a risk of TIPs due to fairly rapid increase in preload. 08/2023 echo showed some signs of diastolic dysfunciton, elevated LA pressure. Suspect rapid increase in preload to a ventricle with some known diastolic dysfunction led to her symptoms - 08/2023 echo: LVEF 70-75%, no WMAs, grade I dd, normal RV. Her E/e' average >14 suggesting elevated LA pressures - CXR trace effusion, BNP 1472 - repeat echo is pending - EKG benign, very mild trop in setting of pulmonary edema and volume overload. Follow trop trend and repeat echo, at this time do not suspect ACS.Suspect SOB and chest pains related to volume overload as stated above - f/u echo, trop trend and symptoms with diuresis.  - of note urinated twice prior to pure wick being placed that was not measured/documented  Dorn Ross MD

## 2023-11-21 NOTE — Progress Notes (Signed)
 Supervising Physician: Karalee Beat  Patient Status:  Andrea Dickson - In-pt  Chief Complaint: S/p TIPS and paracentesis 11/19/23 with Dr. Philip  Subjective:  Patient is sitting up in chair with her son at her bedside on rounds this am. She shares that she is having dyspnea on exertion this morning, and is getting winded every time she has to go to the bathroom. Patient has had several lose stool overnight from lactulose administration. Patient also shares that she isn't ambulating as well as she did yesterday.  Initially, patient did not endorse chest pain. However, as I continued to monitor patient, she began to experience chest pain.   Allergies: Pramipexole, Crestor [rosuvastatin calcium ], Bee pollen, and Pollen extract  Medications: Prior to Admission medications   Medication Sig Start Date End Date Taking? Authorizing Provider  acetaminophen  (TYLENOL ) 500 MG tablet Take 1,000 mg by mouth 2 (two) times daily as needed for moderate pain or headache.   Yes [provider]  buPROPion  (WELLBUTRIN  XL) 150 MG 24 hr tablet Take 1 tablet (150 mg total) by mouth every morning. 11/03/23 11/02/24 Yes Okey Barnie SAUNDERS, MD  Cholecalciferol (VITAMIN D3) 50 MCG (2000 UT) TABS Take 2,000 Units by mouth daily.    Yes [provider]  diazepam  (VALIUM ) 2 MG tablet Take 1 tablet (2 mg total) by mouth daily as needed for anxiety. 11/03/23 11/02/24 Yes Okey Barnie SAUNDERS, MD  estrogens , conjugated, (PREMARIN ) 0.625 MG tablet Take 0.625 mg by mouth daily.   Yes [provider]  ezetimibe (ZETIA) 10 MG tablet Take 10 mg by mouth at bedtime. 10/19/19  Yes [provider]  fluticasone (FLONASE) 50 MCG/ACT nasal spray Place 2 sprays into both nostrils daily. 09/03/22  Yes [provider]  furosemide  (LASIX ) 40 MG tablet Take 1 tablet (40 mg total) by mouth daily. 06/09/23 06/08/24 Yes Mahon, Charmaine CROME, NP  hyoscyamine  (LEVSIN  SL) 0.125 MG SL tablet Place 1 tablet (0.125 mg  total) under the tongue every 6 (six) hours as needed for cramping. 09/17/22  Yes Shirlean Therisa ORN, NP  Lactobacillus-Inulin (CULTURELLE DIGESTIVE HEALTH) CAPS Take 1 capsule by mouth daily. 11/10/21  Yes [provider]  mirtazapine  (REMERON ) 15 MG tablet Take 1 tablet (15 mg total) by mouth at bedtime. 11/03/23  Yes Okey Barnie SAUNDERS, MD  omeprazole  (PRILOSEC) 40 MG capsule TAKE ONE (1) CAPSULE BY MOUTH TWICE DAILY AT [facility] AND AT BEDTIME 07/22/23  Yes Mahon, Courtney L, NP  ondansetron  (ZOFRAN ) 4 MG tablet TAKE 1 TABLET BY MOUTH EVERY 8 HOURS AS NEEDED FOR NAUSEA & VOMITING 12/30/22  Yes Kennedy Charmaine CROME, NP  Pancrelipase , Lip-Prot-Amyl, (ZENPEP ) 60000-189600 units CPEP Take 1 capsule by mouth 3 (three) times daily with meals. 02/01/23  Yes Mahon, Charmaine CROME, NP  propranolol  ER (INDERAL  LA) 80 MG 24 hr capsule Take 1 capsule (80 mg total) by mouth daily. 11/03/23 11/02/24 Yes Okey Barnie SAUNDERS, MD  spironolactone  (ALDACTONE ) 100 MG tablet Take 1 tablet (100 mg total) by mouth daily. 06/09/23 06/08/24 Yes Mahon, Charmaine CROME, NP  cetirizine (ZYRTEC) 10 MG tablet Take 10 mg by mouth daily. Aller-Tec Patient not taking: Reported on 11/16/2023    [provider]  lactulose (CHRONULAC) 10 GM/15ML solution Take 45 mLs (30 g total) by mouth 3 (three) times daily. 11/20/23   Rylei Codispoti A, PA-C  trimethoprim  (TRIMPEX ) 100 MG tablet TAKE 1 TABLET BY MOUTH EVERY DAY AT BEDTIME Patient not taking: Reported on 11/16/2023 06/29/23   Larocco, Sarah C,  FNP     Vital Signs: BP (!) 115/51 (BP Location: Left Arm)   Pulse 90   Temp 98.5 F (36.9 C) (Oral)   Resp (!) 24   Ht 5' 6 (1.676 m)   Wt 155 lb (70.3 kg)   SpO2 97%   BMI 25.02 kg/m   Physical Exam Constitutional:      General: She is not in acute distress.    Appearance: Normal appearance.  Cardiovascular:     Rate and Rhythm: Normal rate and regular rhythm.     Pulses: Normal pulses.     Heart sounds: Normal heart sounds. No murmur  heard. Pulmonary:     Effort: Tachypnea and accessory muscle usage present.     Breath sounds: Wheezing and rales present.     Comments: Patient is with increased work of breathing, and mouth breathing. Musculoskeletal:     Cervical back: Normal range of motion.     Comments: Patient with difficulty standing, and positioning back in bed. She is relying heavily on her walker.  Skin:    General: Skin is warm and dry.     Capillary Refill: Capillary refill takes less than 2 seconds.  Neurological:     Mental Status: She is alert and oriented to person, place, and time.  Psychiatric:        Mood and Affect: Mood normal.        Behavior: Behavior normal.        Thought Content: Thought content normal.        Judgment: Judgment normal.      Labs:  CBC: Recent Labs    09/08/23 1028 11/19/23 0630 11/19/23 0857 11/19/23 1033 11/20/23 0550 11/21/23 0418  WBC 4.4 3.2*  --   --  3.7* 5.2  HGB 11.7* 10.8* 8.5* 7.8* 8.5* 9.4*  HCT 37.6 33.1* 25.0* 23.0* 26.2* 28.5*  PLT 84* 73*  --   --  60* 59*    COAGS: Recent Labs    09/08/23 1028 11/19/23 0630 11/20/23 0550 11/21/23 0418  INR 1.2 1.2 1.4* 1.5*    BMP: Recent Labs    09/08/23 1028 11/19/23 0630 11/19/23 0857 11/19/23 1033 11/20/23 0550 11/21/23 0418  NA 138 140 146* 145 136 139  K 4.7 4.0 3.7 3.9 4.2 3.5  CL 107 107  --   --  108 109  CO2 22 22  --   --  16* 20*  GLUCOSE 114* 149*  --   --  194* 196*  BUN 15 11  --   --  15 16  CALCIUM  8.7* 8.8*  --   --  8.4* 8.9  CREATININE 1.11* 1.31*  --   --  1.41* 1.61*  GFRNONAA 53* 43*  --   --  40* 34*    LIVER FUNCTION TESTS: Recent Labs    09/08/23 1028 11/19/23 0630 11/20/23 0550 11/21/23 0418  BILITOT 1.4* 1.9* 1.3* 1.5*  AST 31 24 31  58*  ALT 15 14 11 27   ALKPHOS 147* 112 63 87  PROT 6.9 6.6 5.4* 5.6*  ALBUMIN  3.4* 3.5 3.4* 3.5    Assessment and Plan:  On rounds this am, patient is noted with increased work of breathing. She initially refused  supplemental O2. O2 sats at 91-92%. Ultimately, patient accepted nasal cannula, and was initiated on 2 L O2. Sats now at 96%. CXR ordered, and concerning for mild pulmonary edema. BNP, Troponin, and Lasix  20 mg IV ordered.  With new onset chest pains, stat  EKG ordered, and consultation placed to Cardiology.  We appreciate their quick response and input.  Hospitalist consulted for assistance in managing patient.  Dr. Karalee and APP will continue to follow closely. For now, discharge is postponed until patient is stabilized. Son at bedside and patient are both in agreement with current plan.   Electronically Signed: Carlin DELENA Griffon, PA-C 11/21/2023, 11:30 AM     I spent a total of 15 Minutes at the the patient's bedside AND on the patient's hospital floor or unit, greater than 50% of which was counseling/coordinating care s/p TIPS.

## 2023-11-21 NOTE — Consult Note (Signed)
 Initial Consultation Note   Patient: Andrea Dickson FMW:[id number] DOB: June 18, 1950 PCP: Lari Elspeth BRAVO, MD DOA: 11/19/2023 DOS: the patient was seen and examined on 11/21/2023 Primary service: Philip Cornet, MD  Referring physician: Karalee Beat, MD Reason for consult: Take over as primary for patient  status post TIPS but now developing cardiac issues.  HPI/Course: Andrea Dickson is a 73 y.o. female with medical history significant of NASH cirrhosis, obesity, diabetes, hyperlipidemia, GERD, chronic diastolic CHF, bipolar disorder, asthma who presented for TIPS procedure and subsequently has developed dyspnea.  Patient presented 11/7 for TIPS procedure in the setting of decompensated cirrhosis with ascites, splenomegaly, varices.  Known NASH cirrhosis.  Underwent procedure 11/7 with plan to discharge 11/8.  However by time of discharge later in the day patient was unable to obtain a ride so she stayed additional night.  Today, patient was noted to have worsening shortness of breath especially on exertion.  Concern for volume overload etiology to continue workup for this.  CMP today showed bicarb 20, creatinine 1.6 up from baseline 1.2-1.3, glucose 196, protein 5.6, AST 58, T. bili 1.5.  CBC with hemoglobin stable at 9.4, platelets stable at 59.  PT and INR mildly elevated at 1.5 and 18.5 respectively.  Troponin mildly elevated at 41 with repeat pending.  BNP significantly elevated to 1477.  Chest x-ray with trace left pleural effusion.  Patient appeared volume overloaded on their exam.  Patient received diuresis and cardiology was consulted recommending repeat echocardiogram and further diuresis.  Patient does report some associated chest comfort.  Denies fevers, chills, abdominal pain, constipation, diarrhea, nausea, vomiting.   Review of Systems: As per HPI otherwise all other systems reviewed and are negative.  Past Medical History:  Diagnosis Date   Anxiety    Asthma     Bipolar 1 disorder (HCC)    Chronic diarrhea    Cirrhosis (HCC)    Diagnosed September 2020, likely secondary to [facility], s/p Hep A/B vaccination in 2021.    Depression    Diabetes (HCC)    Essential tremor    GERD (gastroesophageal reflux disease)    Headache    High cholesterol     Past Surgical History:  Procedure Laterality Date   ABDOMINAL HYSTERECTOMY  1997   BIOPSY  01/17/2018   Procedure: BIOPSY;  Surgeon: Shaaron Lamar HERO, MD;  [facility];  Service: Endoscopy;;  colon   carpal tunnel Bilateral 1999, 06/2009   COLONOSCOPY  2013   Dr. Donnel: no colon polyps. quality of prep was fair. repeat colonoscopy in five years.    COLONOSCOPY WITH PROPOFOL  N/A 01/17/2018   Dr. Shaaron: Colonic lipoma, 2 tubular adenomas removed.  Random colon biopsies negative.  Next colonoscopy 5 years.   COLONOSCOPY WITH PROPOFOL  N/A 09/26/2022   Procedure: COLONOSCOPY WITH PROPOFOL ;  Surgeon: Eartha Angelia Sieving, MD;  [facility];  Service: Gastroenterology;  Laterality: N/A;   ESOPHAGOGASTRODUODENOSCOPY N/A 04/19/2023   Procedure: EGD (ESOPHAGOGASTRODUODENOSCOPY);  Surgeon: Cindie Carlin POUR, DO;  [facility];  Service: Endoscopy;  Laterality: N/A;  1:45 pm, asa 3   ESOPHAGOGASTRODUODENOSCOPY (EGD) WITH PROPOFOL  N/A 04/06/2019   Procedure: ESOPHAGOGASTRODUODENOSCOPY (EGD) WITH PROPOFOL ;  Surgeon: Shaaron Lamar HERO, MD;   Normal esophagus, mild portal hypertensive gastropathy, otherwise normal exam.  Recommend repeat EGD in 2 years for variceal screening.    ESOPHAGOGASTRODUODENOSCOPY (EGD) WITH PROPOFOL  N/A 01/19/2022   Procedure: ESOPHAGOGASTRODUODENOSCOPY (EGD) WITH PROPOFOL ;  Surgeon: Shaaron Lamar HERO, MD;  [facility];  Service: Endoscopy;  Laterality: N/A;  12:45 pm, pt knows to arrive at 9:15   ESOPHAGOGASTRODUODENOSCOPY (EGD) WITH PROPOFOL  N/A 09/25/2022   Procedure: ESOPHAGOGASTRODUODENOSCOPY (EGD) WITH PROPOFOL ;  Surgeon: Eartha Angelia Sieving, MD;   [facility];  Service: Gastroenterology;  Laterality: N/A;   GIVENS CAPSULE STUDY N/A 06/16/2023   Procedure: IMAGING PROCEDURE, GI TRACT, INTRALUMINAL, VIA CAPSULE;  Surgeon: Cindie Carlin POUR, DO;  [facility];  Service: Endoscopy;  Laterality: N/A;  730am   IR INTRAVASCULAR ULTRASOUND NON CORONARY  11/19/2023   IR PARACENTESIS  11/19/2023   IR RADIOLOGIST EVAL & MGMT  04/28/2023   IR TIPS  11/19/2023   IR US  GUIDE VASC ACCESS RIGHT  11/19/2023   IR US  GUIDE VASC ACCESS RIGHT  11/19/2023   POLYPECTOMY  01/17/2018   Procedure: POLYPECTOMY;  Surgeon: Shaaron Lamar HERO, MD;  [facility];  Service: Endoscopy;;  colon   POLYPECTOMY  09/26/2022   Procedure: POLYPECTOMY;  Surgeon: Eartha Angelia Sieving, MD;  [facility];  Service: Gastroenterology;;   SKIN CANCER EXCISION  2007   TONSILLECTOMY  1965   ulner nerve  Left 06/2009   decompression    Social History  reports that she quit smoking about 22 months ago. Her smoking use included cigarettes. She has a 21.5 pack-year smoking history. She has never used smokeless tobacco. She reports that she does not drink alcohol and does not use drugs.  Allergies  Allergen Reactions   Pramipexole Other (See Comments)    Shaking, palpitations, headache, faint feeling   Crestor [Rosuvastatin Calcium ]     Muscle pain   Bee Pollen Other (See Comments)    Eyes and nose run   Pollen Extract Other (See Comments)    Eyes and nose run    Family History  Problem Relation Age of Onset   Diabetes Mother    Brain cancer Mother    Diabetes Father    Heart attack Father    Heart disease Father    Diabetes Sister    Bipolar disorder Brother    Diabetes Brother    Heart disease Brother    Colon cancer Neg Hx    Breast cancer Neg Hx   Reviewed  Prior to Admission medications   Medication Sig Start Date End Date Taking? Authorizing Provider  acetaminophen  (TYLENOL ) 500 MG tablet Take 1,000 mg by mouth 2 (two)  times daily as needed for moderate pain or headache.   Yes [provider]  buPROPion  (WELLBUTRIN  XL) 150 MG 24 hr tablet Take 1 tablet (150 mg total) by mouth every morning. 11/03/23 11/02/24 Yes Okey Barnie SAUNDERS, MD  Cholecalciferol (VITAMIN D3) 50 MCG (2000 UT) TABS Take 2,000 Units by mouth daily.    Yes [provider]  diazepam  (VALIUM ) 2 MG tablet Take 1 tablet (2 mg total) by mouth daily as needed for anxiety. 11/03/23 11/02/24 Yes Okey Barnie SAUNDERS, MD  estrogens , conjugated, (PREMARIN ) 0.625 MG tablet Take 0.625 mg by mouth daily.   Yes [provider]  ezetimibe (ZETIA) 10 MG tablet Take 10 mg by mouth at bedtime. 10/19/19  Yes [provider]  fluticasone (FLONASE) 50 MCG/ACT nasal spray Place 2 sprays into both nostrils daily. 09/03/22  Yes [provider]  furosemide  (LASIX ) 40 MG tablet Take 1 tablet (40 mg total) by mouth daily. 06/09/23 06/08/24 Yes Mahon, Charmaine CROME, NP  hyoscyamine  (LEVSIN  SL) 0.125 MG SL tablet Place 1 tablet (0.125 mg total) under  the tongue every 6 (six) hours as needed for cramping. 09/17/22  Yes Shirlean Therisa ORN, NP  Lactobacillus-Inulin (CULTURELLE DIGESTIVE HEALTH) CAPS Take 1 capsule by mouth daily. 11/10/21  Yes [provider]  mirtazapine  (REMERON ) 15 MG tablet Take 1 tablet (15 mg total) by mouth at bedtime. 11/03/23  Yes Okey Barnie SAUNDERS, MD  omeprazole  (PRILOSEC) 40 MG capsule TAKE ONE (1) CAPSULE BY MOUTH TWICE DAILY AT [facility] AND AT BEDTIME 07/22/23  Yes Mahon, Courtney L, NP  ondansetron  (ZOFRAN ) 4 MG tablet TAKE 1 TABLET BY MOUTH EVERY 8 HOURS AS NEEDED FOR NAUSEA & VOMITING 12/30/22  Yes Kennedy Charmaine CROME, NP  Pancrelipase , Lip-Prot-Amyl, (ZENPEP ) 39999-810399 units CPEP Take 1 capsule by mouth 3 (three) times daily with meals. 02/01/23  Yes Mahon, Charmaine CROME, NP  propranolol  ER (INDERAL  LA) 80 MG 24 hr capsule Take 1 capsule (80 mg total) by mouth daily. 11/03/23 11/02/24 Yes Okey Barnie SAUNDERS, MD   spironolactone  (ALDACTONE ) 100 MG tablet Take 1 tablet (100 mg total) by mouth daily. 06/09/23 06/08/24 Yes Mahon, Charmaine CROME, NP  cetirizine (ZYRTEC) 10 MG tablet Take 10 mg by mouth daily. Aller-Tec Patient not taking: Reported on 11/16/2023    [provider]  lactulose (CHRONULAC) 10 GM/15ML solution Take 45 mLs (30 g total) by mouth 3 (three) times daily. 11/20/23   Carim, Charles A, PA-C  trimethoprim  (TRIMPEX ) 100 MG tablet TAKE 1 TABLET BY MOUTH EVERY DAY AT BEDTIME Patient not taking: Reported on 11/16/2023 06/29/23   Gerldine Lauraine BROCKS, FNP    Physical Exam: Vitals:   11/21/23 0005 11/21/23 0339 11/21/23 0805 11/21/23 1105  BP: (!) 111/45 (!) 116/51 (!) 113/51 (!) 115/51  Pulse: 88 94 93 90  Resp:    (!) 24  Temp: 98.1 F (36.7 C) 99.3 F (37.4 C) 98.6 F (37 C) 98.5 F (36.9 C)  TempSrc: Oral Oral Oral Oral  SpO2: 94% 90% 93% 97%  Weight:      Height:        Physical Exam Constitutional:      General: She is not in acute distress.    Appearance: Normal appearance.  HENT:     Head: Normocephalic and atraumatic.     Mouth/Throat:     Mouth: Mucous membranes are moist.     Pharynx: Oropharynx is clear.  Eyes:     Extraocular Movements: Extraocular movements intact.     Pupils: Pupils are equal, round, and reactive to light.  Cardiovascular:     Rate and Rhythm: Normal rate and regular rhythm.     Pulses: Normal pulses.     Heart sounds: Normal heart sounds.  Pulmonary:     Effort: Pulmonary effort is normal. No respiratory distress.     Breath sounds: No wheezing or rales (trace).  Abdominal:     General: Bowel sounds are normal. There is no distension.     Palpations: Abdomen is soft.     Tenderness: There is no abdominal tenderness.  Musculoskeletal:        General: No swelling or deformity.     Right lower leg: Edema (chronic) present.     Left lower leg: No edema.  Skin:    General: Skin is warm and dry.  Neurological:     General: No focal  deficit present.     Mental Status: Mental status is at baseline.    Labs on Admission: I have personally reviewed following labs and imaging studies  CBC: Recent Labs  Lab  11/19/23 0630 11/19/23 0857 11/19/23 1033 11/20/23 0550 11/21/23 0418  WBC 3.2*  --   --  3.7* 5.2  NEUTROABS 2.5  --   --   --   --   HGB 10.8* 8.5* 7.8* 8.5* 9.4*  HCT 33.1* 25.0* 23.0* 26.2* 28.5*  MCV 89.5  --   --  90.3 89.1  PLT 73*  --   --  60* 59*    Basic Metabolic Panel: Recent Labs  Lab 11/19/23 0630 11/19/23 0857 11/19/23 1033 11/20/23 0550 11/21/23 0418  NA 140 146* 145 136 139  K 4.0 3.7 3.9 4.2 3.5  CL 107  --   --  108 109  CO2 22  --   --  16* 20*  GLUCOSE 149*  --   --  194* 196*  BUN 11  --   --  15 16  CREATININE 1.31*  --   --  1.41* 1.61*  CALCIUM  8.8*  --   --  8.4* 8.9    GFR: Estimated Creatinine Clearance: 29.6 mL/min (A) (by C-G formula based on SCr of 1.61 mg/dL (H)).  Liver Function Tests: Recent Labs  Lab 11/19/23 0630 11/20/23 0550 11/21/23 0418  AST 24 31 58*  ALT 14 11 27   ALKPHOS 112 63 87  BILITOT 1.9* 1.3* 1.5*  PROT 6.6 5.4* 5.6*  ALBUMIN  3.5 3.4* 3.5    Urine analysis:    Component Value Date/Time   COLORURINE YELLOW 07/25/2022 1500   APPEARANCEUR Clear 01/14/2023 1045   LABSPEC 1.013 07/25/2022 1500   PHURINE 6.0 07/25/2022 1500   GLUCOSEU Negative 01/14/2023 1045   HGBUR NEGATIVE 07/25/2022 1500   BILIRUBINUR Negative 01/14/2023 1045   KETONESUR NEGATIVE 07/25/2022 1500   PROTEINUR Negative 01/14/2023 1045   PROTEINUR NEGATIVE 07/25/2022 1500   UROBILINOGEN 1.0 10/08/2015 1347   NITRITE Negative 01/14/2023 1045   NITRITE NEGATIVE 07/25/2022 1500   LEUKOCYTESUR 1+ (A) 01/14/2023 1045   LEUKOCYTESUR NEGATIVE 07/25/2022 1500    Radiological Exams on Admission: DG Chest Port 1 View Result Date: 11/21/2023 CLINICAL DATA:  10026 Shortness of breath 10026 EXAM: PORTABLE CHEST 1 VIEW COMPARISON:  June 28, 2023 FINDINGS: The  cardiomediastinal silhouette is unchanged in contour. Trace LEFT pleural effusion. No pneumothorax. No acute pleuroparenchymal abnormality. IMPRESSION: Trace LEFT pleural effusion. Electronically Signed   By: Corean Salter M.D.   On: 11/21/2023 10:06   EKG: Independently reviewed.  Sinus rhythm at 89 beats minute.  Nonspecific T wave changes.  Minimal baseline wander.  Assessment/Plan Principal Problem:   S/P TIPS (transjugular intrahepatic portosystemic shunt) Active Problems:   Bipolar I disorder, most recent episode (or current) manic (HCC)   Hepatic cirrhosis (HCC)   Asthma, chronic   Morbid obesity (HCC)   Esophageal reflux   Mixed hyperlipidemia   Acute on chronic diastolic CHF > Did have echo in August which showed EF 70-75%, G1 DD, normal RV function. > No known history of CHF nor symptoms beyond this recent echocardiogram with G1 DD.  Though, patient had chronically been on diuretic due to her cirrhosis which may have masked symptoms. > Patient did have increase in preload/right heart pressures following TIPS procedure as would be expected which along with some predisposition with diastolic dysfunction may have precipitated this exacerbation. > Effusion on chest x-ray, BNP 1477.  Troponin mildly elevated at 41. > Patient is already received 40 mg IV Lasix  and be evaluated by cardiology recommending further 40 mg IV twice daily. - Monitor on progressive unit -  Appreciate cardiology recommendations and assistance - Continue with Lasix  40 mg IV twice daily - Echocardiogram - Strict I's and O's, daily weights - Check magnesium - Trend renal function and electrolytes - Continue home spironolactone  - Supportive care  AKI > Creatinine elevated 1.6 baseline 1.2-1.3.  Likely in setting of above. - Continue with diuresis - Trend renal function and electrolytes  NASH cirrhosis Status post TIPS > Underwent TIPS procedure 2 days ago by interventional radiology. >  Interventional radiology to follow from a distance and are available for questions. - IV Lasix  as above - Continue home spironolactone , propranolol , lactulose, PPI  - Trend LFTs, PT/INR  Hyperlipidemia - Continue Zetia  Bipolar disorder - Continue home bupropion , mirtazapine , diazepam   DVT prophylaxis: SCDs, given low platelets Code Status:   Full Family Communication:  Updated at bedside  Disposition Plan:   Patient is from:  Home  Anticipated DC to:  Pending clinical course  Anticipated DC date:  1 to 3 additional days  Anticipated DC barriers: None  Consults called:  Cardiology, interventional radiology will follow from distance Admission status:  Inpatient, progressive  Severity of Illness: The appropriate patient status for this patient is INPATIENT. Inpatient status is judged to be reasonable and necessary in order to provide the required intensity of service to ensure the patient's safety. The patient's presenting symptoms, physical exam findings, and initial radiographic and laboratory data in the context of their chronic comorbidities is felt to place them at high risk for further clinical deterioration. Furthermore, it is not anticipated that the patient will be medically stable for discharge from the hospital within 2 midnights of admission.   * I certify that at the point of admission it is my clinical judgment that the patient will require inpatient hospital care spanning beyond 2 midnights from the point of admission due to high intensity of service, high risk for further deterioration and high frequency of surveillance required.*  [facility] will continue to follow the patient and take over as primary.  Marsa KATHEE Scurry MD [facility]  How to contact the [facility] Attending or Consulting provider 7A - 7P or covering provider during after hours 7P -7A, for this patient?   Check the care team in [facility] and look for a) attending/consulting [facility] provider listed and b) the [facility]  team listed Log into [email or web address] and use 's universal password to access. If you do not have the password, please contact the hospital operator. Locate the [facility] provider you are looking for under [facility] and page to a number that you can be directly reached. If you still have difficulty reaching the provider, please page the [facility] (Director on Call) for the Hospitalists listed on amion for assistance.  11/21/2023, 12:59 PM

## 2023-11-21 NOTE — Consult Note (Addendum)
 Cardiology Consultation   Patient ID: Andrea Dickson MRN: [id number]; DOB: 1950-06-10  Admit date: 11/19/2023 Date of Consult: 11/21/2023  PCP:  Lari Elspeth BRAVO, MD   [facility] Providers Cardiologist:  None   Patient Profile: Andrea Dickson is a 73 y.o. female with a hx of Hollie, cirrhosis with ascites, hyperlipidemia, former smoker, type 2 diabetes, bipolar, varices, GERD, recurrent UTI, tremors who is being seen 11/21/2023 for the evaluation of chest pain/shortness of breath at the request of Dr. Philip.  History of Present Illness: Andrea Dickson has no prior cardiac history.  She reports father and brother had MI in their 39s and 43s.  She is a former smoker and quit last year.  Denied any drugs or alcohol.  She has previous echocardiogram 08/2023 with hyperdynamic EF 70 to 75% with normal RV function.  No significant valvular disease.  Patient currently admitted with known cirrhosis and recurrent issues of ascites now status post TIPS procedure on 11/7.  She was doing fine after the procedure but today after ambulating to the bathroom she became acutely short of breath.  She then laid down in her bed and then started having an episode of chest pain that she described as a throbbing sensation.  Typically does not have chest pain or shortness of breath when she is at home.  Pain was self resolving within 30 minutes.  She was given 1 dose of IV Lasix  20 mg, cardiology consulted and we had ordered another dose of 20.  She still has pending BNP and troponin. Now she is feeling much better but still short of breath and mildly tachypneic on.  She reports at home she lives independently, not very ambulatory.  Reports compliancy with all of her medications and reports chronic degree of peripheral edema but generally without any significant episodes of shortness of breath or orthopnea.  Does not have chest pain typically with ambulation.  Potassium 3.5.  Creatinine 1.61.  AST 58.   Total bilirubin 1.5.  Hemoglobin 9.4.  Chest x-ray with trace left pleural effusion.   Past Medical History:  Diagnosis Date   Anxiety    Asthma    Bipolar 1 disorder (HCC)    Chronic diarrhea    Cirrhosis (HCC)    Diagnosed September 2020, likely secondary to [facility], s/p Hep A/B vaccination in 2021.    Depression    Diabetes (HCC)    Essential tremor    GERD (gastroesophageal reflux disease)    Headache    High cholesterol     Past Surgical History:  Procedure Laterality Date   ABDOMINAL HYSTERECTOMY  1997   BIOPSY  01/17/2018   Procedure: BIOPSY;  Surgeon: Shaaron Lamar HERO, MD;  [facility];  Service: Endoscopy;;  colon   carpal tunnel Bilateral 1999, 06/2009   COLONOSCOPY  2013   Dr. Donnel: no colon polyps. quality of prep was fair. repeat colonoscopy in five years.    COLONOSCOPY WITH PROPOFOL  N/A 01/17/2018   Dr. Shaaron: Colonic lipoma, 2 tubular adenomas removed.  Random colon biopsies negative.  Next colonoscopy 5 years.   COLONOSCOPY WITH PROPOFOL  N/A 09/26/2022   Procedure: COLONOSCOPY WITH PROPOFOL ;  Surgeon: Eartha Angelia Sieving, MD;  [facility];  Service: Gastroenterology;  Laterality: N/A;   ESOPHAGOGASTRODUODENOSCOPY N/A 04/19/2023   Procedure: EGD (ESOPHAGOGASTRODUODENOSCOPY);  Surgeon: Cindie Carlin POUR, DO;  [facility];  Service: Endoscopy;  Laterality: N/A;  1:45 pm, asa 3   ESOPHAGOGASTRODUODENOSCOPY (EGD) WITH PROPOFOL  N/A  04/06/2019   Procedure: ESOPHAGOGASTRODUODENOSCOPY (EGD) WITH PROPOFOL ;  Surgeon: Shaaron Lamar HERO, MD;   Normal esophagus, mild portal hypertensive gastropathy, otherwise normal exam.  Recommend repeat EGD in 2 years for variceal screening.    ESOPHAGOGASTRODUODENOSCOPY (EGD) WITH PROPOFOL  N/A 01/19/2022   Procedure: ESOPHAGOGASTRODUODENOSCOPY (EGD) WITH PROPOFOL ;  Surgeon: Shaaron Lamar HERO, MD;  [facility];  Service: Endoscopy;  Laterality: N/A;  12:45 pm, pt knows to arrive at 9:15    ESOPHAGOGASTRODUODENOSCOPY (EGD) WITH PROPOFOL  N/A 09/25/2022   Procedure: ESOPHAGOGASTRODUODENOSCOPY (EGD) WITH PROPOFOL ;  Surgeon: Eartha Angelia Sieving, MD;  [facility];  Service: Gastroenterology;  Laterality: N/A;   GIVENS CAPSULE STUDY N/A 06/16/2023   Procedure: IMAGING PROCEDURE, GI TRACT, INTRALUMINAL, VIA CAPSULE;  Surgeon: Cindie Carlin POUR, DO;  [facility];  Service: Endoscopy;  Laterality: N/A;  730am   IR INTRAVASCULAR ULTRASOUND NON CORONARY  11/19/2023   IR PARACENTESIS  11/19/2023   IR RADIOLOGIST EVAL & MGMT  04/28/2023   IR TIPS  11/19/2023   IR US  GUIDE VASC ACCESS RIGHT  11/19/2023   IR US  GUIDE VASC ACCESS RIGHT  11/19/2023   POLYPECTOMY  01/17/2018   Procedure: POLYPECTOMY;  Surgeon: Shaaron Lamar HERO, MD;  [facility];  Service: Endoscopy;;  colon   POLYPECTOMY  09/26/2022   Procedure: POLYPECTOMY;  Surgeon: Eartha Angelia, Sieving, MD;  [facility];  Service: Gastroenterology;;   SKIN CANCER EXCISION  2007   TONSILLECTOMY  1965   ulner nerve  Left 06/2009   decompression     Scheduled Meds:  buPROPion   150 mg Oral BH-q7a   ezetimibe  10 mg Oral QHS   furosemide   20 mg Intravenous Once   lactulose  30 g Oral TID   mirtazapine   15 mg Oral QHS   pantoprazole   40 mg Oral Daily   propranolol  ER  80 mg Oral Daily   spironolactone   100 mg Oral Daily   trimethoprim   100 mg Oral QHS   Continuous Infusions:  PRN Meds: acetaminophen , diazepam , ondansetron   Allergies:    Allergies  Allergen Reactions   Pramipexole Other (See Comments)    Shaking, palpitations, headache, faint feeling   Crestor [Rosuvastatin Calcium ]     Muscle pain   Bee Pollen Other (See Comments)    Eyes and nose run   Pollen Extract Other (See Comments)    Eyes and nose run    Social History:   Social History   Socioeconomic History   Marital status: Divorced    Spouse name: Not on file   Number of children: 1   Years of education: 12    Highest education level: Not on file  Occupational History   Occupation: retired    Comment: employed at [employer]  Tobacco Use   Smoking status: Former    Current packs/day: 0.00    Average packs/day: 0.5 packs/day for 43.0 years (21.5 ttl pk-yrs)    Types: Cigarettes    Quit date: 2024    Years since quitting: 1.8   Smokeless tobacco: Never   Tobacco comments:    smoking since 72yrs old.    Vaping Use   Vaping status: Never Used  Substance and Sexual Activity   Alcohol use: No    Alcohol/week: 0.0 standard drinks of alcohol   Drug use: No   Sexual activity: Not Currently  Other Topics Concern   Not on file  Social History Narrative   Lives alone.  Retired.  Education 12th grade.  One child.     Caffeine use- sodas, 2 daily   Social Drivers of Health   Financial Resource Strain: Low Risk  (11/10/2021)   Received from [facility]   Overall Financial Resource Strain (CARDIA)    Difficulty of Paying Living Expenses: Not hard at all  Food Insecurity: No Food Insecurity (11/19/2023)   Hunger Vital Sign    Worried About Running Out of Food in the Last Year: Never true    Ran Out of Food in the Last Year: Never true  Transportation Needs: No Transportation Needs (11/19/2023)   PRAPARE - Administrator, Civil Service (Medical): No    Lack of Transportation (Non-Medical): No  Physical Activity: Inactive (11/10/2021)   Received [facility]   Exercise Vital Sign    On average, how many days per week do you engage in moderate to strenuous exercise (like a brisk walk)?: 0 days    On average, how many minutes do you engage in exercise at this level?: 0 min  Stress: No Stress Concern Present (11/10/2021)   Received from [facility] of Occupational Health - Occupational Stress Questionnaire    Feeling of Stress : Not at all  Social Connections: Socially Isolated (11/19/2023)   Social Connection and Isolation Panel    Frequency of  Communication with Friends and Family: More than three times a week    Frequency of Social Gatherings with Friends and Family: More than three times a week    Attends Religious Services: Never    Database Administrator or Organizations: No    Attends Banker Meetings: Never    Marital Status: Divorced  Catering Manager Violence: Not At Risk (11/19/2023)   Humiliation, Afraid, Rape, and Kick questionnaire    Fear of Current or Ex-Partner: No    Emotionally Abused: No    Physically Abused: No    Sexually Abused: No    Family History:   Family History  Problem Relation Age of Onset   Diabetes Mother    Brain cancer Mother    Diabetes Father    Heart attack Father    Heart disease Father    Diabetes Sister    Bipolar disorder Brother    Diabetes Brother    Heart disease Brother    Colon cancer Neg Hx    Breast cancer Neg Hx      ROS:  Please see the history of present illness.  All other ROS reviewed and negative.     Physical Exam/Data: Vitals:   11/20/23 1950 11/21/23 0005 11/21/23 0339 11/21/23 0805  BP: (!) 110/51 (!) 111/45 (!) 116/51 (!) 113/51  Pulse: 83 88 94 93  Resp: (!) 23     Temp: 98 F (36.7 C) 98.1 F (36.7 C) 99.3 F (37.4 C) 98.6 F (37 C)  TempSrc: Oral Oral Oral Oral  SpO2: 97% 94% 90% 93%  Weight:      Height:       No intake or output data in the 24 hours ending 11/21/23 1104    11/19/2023    6:13 AM 11/18/2023   10:40 AM 09/07/2023    3:17 PM  Last 3 Weights  Weight (lbs) 155 lb 160 lb 165 lb 3.2 oz  Weight (kg) 70.308 kg 72.576 kg 74.934 kg     Body mass index is 25.02 kg/m.  General:  Well nourished, well developed, in no acute distress  HEENT: normal Neck: + JVD Vascular: No carotid bruits; Distal pulses 2+ bilaterally Cardiac:  normal S1, S2; RRR; no murmur  Lungs:  clear to auscultation bilaterally, no wheezing, rhonchi or rales  Abd: soft, nontender, no hepatomegaly  Ext: 1+ edema Musculoskeletal:  No deformities,  BUE and BLE strength normal and equal Skin: warm and dry  Neuro:  CNs 2-12 intact, no focal abnormalities noted Psych:  Normal affect   EKG:  The EKG was personally reviewed and demonstrates: Sinus rhythm with no acute ST-T wave changes. Telemetry:  Telemetry was personally reviewed and demonstrates: Sinus rhythm however she was not on telemetry for very long time maybe less than 5 min  Relevant CV Studies: Echocardiogram 08/31/2023 1. Left ventricular ejection fraction, by estimation, is 70 to 75%. The  left ventricle has hyperdynamic function. The left ventricle has no  regional wall motion abnormalities. There is mild left ventricular  hypertrophy. Left ventricular diastolic  parameters are consistent with Grade I diastolic dysfunction (impaired  relaxation). Elevated left atrial pressure.   2. Right ventricular systolic function is normal. The right ventricular  size is normal. Tricuspid regurgitation signal is inadequate for assessing  PA pressure.   3. The mitral valve is normal in structure. No evidence of mitral valve  regurgitation. No evidence of mitral stenosis.   4. The aortic valve is tricuspid. There is mild calcification of the  aortic valve. There is mild thickening of the aortic valve. Aortic valve  regurgitation is not visualized. No aortic stenosis is present.   5. The inferior vena cava is normal in size with greater than 50%  respiratory variability, suggesting right atrial pressure of 3 mmHg.   Comparison(s): No prior Echocardiogram.    Laboratory Data: High Sensitivity Troponin:  No results for input(s): TROPONINIHS in the last 720 hours.   Chemistry Recent Labs  Lab 11/19/23 0630 11/19/23 0857 11/19/23 1033 11/20/23 0550 11/21/23 0418  NA 140   < > 145 136 139  K 4.0   < > 3.9 4.2 3.5  CL 107  --   --  108 109  CO2 22  --   --  16* 20*  GLUCOSE 149*  --   --  194* 196*  BUN 11  --   --  15 16  CREATININE 1.31*  --   --  1.41* 1.61*  CALCIUM  8.8*   --   --  8.4* 8.9  GFRNONAA 43*  --   --  40* 34*  ANIONGAP 11  --   --  12 10   < > = values in this interval not displayed.    Recent Labs  Lab 11/19/23 0630 11/20/23 0550 11/21/23 0418  PROT 6.6 5.4* 5.6*  ALBUMIN  3.5 3.4* 3.5  AST 24 31 58*  ALT 14 11 27   ALKPHOS 112 63 87  BILITOT 1.9* 1.3* 1.5*   Lipids No results for input(s): CHOL, TRIG, HDL, LABVLDL, LDLCALC, CHOLHDL in the last 168 hours.  Hematology Recent Labs  Lab 11/19/23 0630 11/19/23 0857 11/19/23 1033 11/20/23 0550 11/21/23 0418  WBC 3.2*  --   --  3.7* 5.2  RBC 3.70*  --   --  2.90* 3.20*  HGB 10.8*   < > 7.8* 8.5* 9.4*  HCT 33.1*   < > 23.0* 26.2* 28.5*  MCV 89.5  --   --  90.3 89.1  MCH 29.2  --   --  29.3 29.4  MCHC 32.6  --   --  32.4  33.0  RDW 15.7*  --   --  15.9* 16.7*  PLT 73*  --   --  60* 59*   < > = values in this interval not displayed.   Thyroid  No results for input(s): TSH, FREET4 in the last 168 hours.  BNPNo results for input(s): BNP, PROBNP in the last 168 hours.  DDimer No results for input(s): DDIMER in the last 168 hours.  Radiology/Studies:  DG Chest Port 1 View Result Date: 11/21/2023 CLINICAL DATA:  10026 Shortness of breath 10026 EXAM: PORTABLE CHEST 1 VIEW COMPARISON:  June 28, 2023 FINDINGS: The cardiomediastinal silhouette is unchanged in contour. Trace LEFT pleural effusion. No pneumothorax. No acute pleuroparenchymal abnormality. IMPRESSION: Trace LEFT pleural effusion. Electronically Signed   By: Corean Salter M.D.   On: 11/21/2023 10:06   IR Tips Result Date: 11/19/2023 CLINICAL DATA:  73 year old with cirrhosis and refractory ascites. Patient requires frequent large volume paracentesis. MELD 3.0 = 15. EXAM: 1. Ultrasound-guided paracentesis 2. Ultrasound-guided access of the right internal jugular vein 3. Ultrasound-guided access of the right common femoral vein 4. Hepatic venogram 5. Intravascular ultrasound 6. Catheterization of the portal  vein 7. Portal venous and central manometry 8. Portal venogram 9. Creation of a transhepatic portal vein to hepatic vein shunt MEDICATIONS: As antibiotic prophylaxis, Rocephin  2 g was ordered pre-procedure and administered intravenously within one hour of incision. ANESTHESIA/SEDATION: General - as administered by the Anesthesia department CONTRAST:  75 mL Omnipaque  300, intravenous FLUOROSCOPY TIME:  Radiation Exposure Index (as provided by the fluoroscopic device): 220 mGy Kerma COMPLICATIONS: None immediate. PROCEDURE: Informed written consent was obtained from the patient after a thorough discussion of the procedural risks, benefits and alternatives. All questions were addressed. Maximal Sterile Barrier Technique was utilized including caps, mask, sterile gowns, sterile gloves, sterile drape, hand hygiene and skin antiseptic. A timeout was performed prior to the initiation of the procedure. Dr. Wilkie Lent assisted with the procedure. Right abdomen, right groin and right neck were prepped and draped in sterile fashion. Ultrasound demonstrated a large pocket of fluid in the right abdomen. Ultrasound image was saved for documentation. Skin was anesthetized with 1% lidocaine . Using ultrasound guidance, paracentesis catheter was directed into the ascites. Approximately 5 L of yellow fluid was drained during the procedure. Paracentesis catheter was removed after the paracentesis. Bandage placed over the puncture site. Ultrasound confirmed a patent right common femoral vein. Using a combination of fluoroscopy and ultrasound, an access site was determined. A small dermatotomy was made at the planned puncture site. Using ultrasound guidance, access into the right common femoral vein was obtained with visualization of needle entry into the vessel using a standard micropuncture technique. Wire was advanced into the IVC. An 8 French vascular sheath was placed. Through this access site, an 41 French Accunav ICE catheter  was advanced with ease under fluoroscopic guidance to the level of the intrahepatic inferior vena cava. A preliminary ultrasound of the right neck was performed and demonstrates a patent internal jugular vein. A permanent ultrasound image was recorded. Using a combination of fluoroscopy and ultrasound, an access site was determined. A small dermatotomy was made at the planned puncture site. Using ultrasound guidance, access into the right internal jugular vein was obtained with visualization of needle entry into the vessel using a standard micropuncture technique. A wire was advanced into the IVC and serial fascial dilation performed. A 10 French tips sheath was placed into the internal jugular vein and advanced to the IVC. The jugular sheath  was retracted into the right atrium and manometry was performed. A 5 French angled tip catheter was then directed into the middle hepatic vein. Hepatic venogram was performed. These images demonstrated a patent hepatic vein with no stenosis. The catheter was advanced to a wedge portion of the patent vein over which the 10 French sheath was advanced into the middle hepatic vein. Using ICE ultrasound visualization the catheter and middle hepatic vein as well as the portal anatomy was defined. A planned exit site from the hepatic vein and puncture site from the portal vein was placed into a single sonographic plane. Under direct ultrasound visualization, the ScorpionX needle was advanced into the portal venous system near the portal vein bifurcation. A Glidewire Advantage was then advanced into the splenic vein. A 5 French marking pigtail catheter was then advanced over the wire into the main portal vein. It was difficult to remove the wire from the pigtail catheter. It was noted that the [facility] Advantage coating had sheared off and was trapped within the pigtail catheter. We were unable to advance a microwire or microcatheter through the pigtail catheter. We attempted to snare  the wire coating with micro snare devices but this was also unsuccessful. Back of a stiff Glidewire was advanced into the pigtail catheter adjacent to the sheared off wire coating. The pigtail catheter was advanced further into the portal venous system until the wire was near the portal venous system. The stiff wire never exited the pigtail catheter. The pigtail catheter hub was cut. A 6 French Neuron MAX sheath was advanced over the pigtail catheter and through the 10 French sheath. The 6 French sheath was successfully advanced over the pigtail catheter and into the portal venous system using fluoroscopy. Once the 6 French sheath was within the portal venous system, the pigtail catheter was completely removed. Portal venogram was performed through the 6 French vascular sheath. Superstiff Amplatz wire was placed. A new pigtail catheter was placed. Portal manometry was then performed. The tract was then dilated to 8 mm with an 8 mm x 8 cm Athletis balloon. A 8-10 mm by 8 + 2 cm of Viatorr endograft was placed. This was ultimately dilated to 8 mm. After placement of the shunt, right atrial and portal pressures were repeated. Completion portal venogram demonstrates a patent TIPS endograft without significant gastroesophageal varices. The catheters and sheath were removed and manual compression was applied to the right internal jugular and right common femoral venous access sites until hemostasis was achieved. The patient was transferred to the PACU in stable condition. Pre-TIPS Mean Pressures (mmHg): Right atrium: 10 Portal vein: 39 Portosystemic gradient: 29 Post-TIPS Mean Pressures (mmHg): Right atrium:28 Portal vein: 34 Portosystemic gradient: 6 FINDINGS: Main portal vein and TIPS stent were widely patent at the end of the procedure. No significant filling of varices on the final portogram images. 5 L of ascites was removed. IMPRESSION: 1. Successful transjugular portosystemic shunt creation. 2. Portosystemic  gradient of 29 mm Hg (absolute portal venous pressure 39 mm Hg) before shunt placement and 6 mm Hg (absolute portal venous pressure 34 mm Hg) after shunt placement. PLAN: Patient will be admitted for overnight observation. Electronically Signed   By: Juliene Balder M.D.   On: 11/19/2023 16:42   IR US  Guide Vasc Access Right Result Date: 11/19/2023 CLINICAL DATA:  73 year old with cirrhosis and refractory ascites. Patient requires frequent large volume paracentesis. MELD 3.0 = 15. EXAM: 1. Ultrasound-guided paracentesis 2. Ultrasound-guided access of the right internal jugular vein 3.  Ultrasound-guided access of the right common femoral vein 4. Hepatic venogram 5. Intravascular ultrasound 6. Catheterization of the portal vein 7. Portal venous and central manometry 8. Portal venogram 9. Creation of a transhepatic portal vein to hepatic vein shunt MEDICATIONS: As antibiotic prophylaxis, Rocephin  2 g was ordered pre-procedure and administered intravenously within one hour of incision. ANESTHESIA/SEDATION: General - as administered by the Anesthesia department CONTRAST:  75 mL Omnipaque  300, intravenous FLUOROSCOPY TIME:  Radiation Exposure Index (as provided by the fluoroscopic device): 220 mGy Kerma COMPLICATIONS: None immediate. PROCEDURE: Informed written consent was obtained from the patient after a thorough discussion of the procedural risks, benefits and alternatives. All questions were addressed. Maximal Sterile Barrier Technique was utilized including caps, mask, sterile gowns, sterile gloves, sterile drape, hand hygiene and skin antiseptic. A timeout was performed prior to the initiation of the procedure. Dr. Wilkie Lent assisted with the procedure. Right abdomen, right groin and right neck were prepped and draped in sterile fashion. Ultrasound demonstrated a large pocket of fluid in the right abdomen. Ultrasound image was saved for documentation. Skin was anesthetized with 1% lidocaine . Using ultrasound  guidance, paracentesis catheter was directed into the ascites. Approximately 5 L of yellow fluid was drained during the procedure. Paracentesis catheter was removed after the paracentesis. Bandage placed over the puncture site. Ultrasound confirmed a patent right common femoral vein. Using a combination of fluoroscopy and ultrasound, an access site was determined. A small dermatotomy was made at the planned puncture site. Using ultrasound guidance, access into the right common femoral vein was obtained with visualization of needle entry into the vessel using a standard micropuncture technique. Wire was advanced into the IVC. An 8 French vascular sheath was placed. Through this access site, an 40 French Accunav ICE catheter was advanced with ease under fluoroscopic guidance to the level of the intrahepatic inferior vena cava. A preliminary ultrasound of the right neck was performed and demonstrates a patent internal jugular vein. A permanent ultrasound image was recorded. Using a combination of fluoroscopy and ultrasound, an access site was determined. A small dermatotomy was made at the planned puncture site. Using ultrasound guidance, access into the right internal jugular vein was obtained with visualization of needle entry into the vessel using a standard micropuncture technique. A wire was advanced into the IVC and serial fascial dilation performed. A 10 French tips sheath was placed into the internal jugular vein and advanced to the IVC. The jugular sheath was retracted into the right atrium and manometry was performed. A 5 French angled tip catheter was then directed into the middle hepatic vein. Hepatic venogram was performed. These images demonstrated a patent hepatic vein with no stenosis. The catheter was advanced to a wedge portion of the patent vein over which the 10 French sheath was advanced into the middle hepatic vein. Using ICE ultrasound visualization the catheter and middle hepatic vein as well as  the portal anatomy was defined. A planned exit site from the hepatic vein and puncture site from the portal vein was placed into a single sonographic plane. Under direct ultrasound visualization, the ScorpionX needle was advanced into the portal venous system near the portal vein bifurcation. A Glidewire Advantage was then advanced into the splenic vein. A 5 French marking pigtail catheter was then advanced over the wire into the main portal vein. It was difficult to remove the wire from the pigtail catheter. It was noted that the [facility] Advantage coating had sheared off and was trapped within the pigtail catheter.  We were unable to advance a microwire or microcatheter through the pigtail catheter. We attempted to snare the wire coating with micro snare devices but this was also unsuccessful. Back of a stiff Glidewire was advanced into the pigtail catheter adjacent to the sheared off wire coating. The pigtail catheter was advanced further into the portal venous system until the wire was near the portal venous system. The stiff wire never exited the pigtail catheter. The pigtail catheter hub was cut. A 6 French Neuron MAX sheath was advanced over the pigtail catheter and through the 10 French sheath. The 6 French sheath was successfully advanced over the pigtail catheter and into the portal venous system using fluoroscopy. Once the 6 French sheath was within the portal venous system, the pigtail catheter was completely removed. Portal venogram was performed through the 6 French vascular sheath. Superstiff Amplatz wire was placed. A new pigtail catheter was placed. Portal manometry was then performed. The tract was then dilated to 8 mm with an 8 mm x 8 cm Athletis balloon. A 8-10 mm by 8 + 2 cm of Viatorr endograft was placed. This was ultimately dilated to 8 mm. After placement of the shunt, right atrial and portal pressures were repeated. Completion portal venogram demonstrates a patent TIPS endograft without  significant gastroesophageal varices. The catheters and sheath were removed and manual compression was applied to the right internal jugular and right common femoral venous access sites until hemostasis was achieved. The patient was transferred to the PACU in stable condition. Pre-TIPS Mean Pressures (mmHg): Right atrium: 10 Portal vein: 39 Portosystemic gradient: 29 Post-TIPS Mean Pressures (mmHg): Right atrium:28 Portal vein: 34 Portosystemic gradient: 6 FINDINGS: Main portal vein and TIPS stent were widely patent at the end of the procedure. No significant filling of varices on the final portogram images. 5 L of ascites was removed. IMPRESSION: 1. Successful transjugular portosystemic shunt creation. 2. Portosystemic gradient of 29 mm Hg (absolute portal venous pressure 39 mm Hg) before shunt placement and 6 mm Hg (absolute portal venous pressure 34 mm Hg) after shunt placement. PLAN: Patient will be admitted for overnight observation. Electronically Signed   By: Juliene Balder M.D.   On: 11/19/2023 16:42   IR US  Guide Vasc Access Right Result Date: 11/19/2023 CLINICAL DATA:  73 year old with cirrhosis and refractory ascites. Patient requires frequent large volume paracentesis. MELD 3.0 = 15. EXAM: 1. Ultrasound-guided paracentesis 2. Ultrasound-guided access of the right internal jugular vein 3. Ultrasound-guided access of the right common femoral vein 4. Hepatic venogram 5. Intravascular ultrasound 6. Catheterization of the portal vein 7. Portal venous and central manometry 8. Portal venogram 9. Creation of a transhepatic portal vein to hepatic vein shunt MEDICATIONS: As antibiotic prophylaxis, Rocephin  2 g was ordered pre-procedure and administered intravenously within one hour of incision. ANESTHESIA/SEDATION: General - as administered by the Anesthesia department CONTRAST:  75 mL Omnipaque  300, intravenous FLUOROSCOPY TIME:  Radiation Exposure Index (as provided by the fluoroscopic device): 220 mGy Kerma  COMPLICATIONS: None immediate. PROCEDURE: Informed written consent was obtained from the patient after a thorough discussion of the procedural risks, benefits and alternatives. All questions were addressed. Maximal Sterile Barrier Technique was utilized including caps, mask, sterile gowns, sterile gloves, sterile drape, hand hygiene and skin antiseptic. A timeout was performed prior to the initiation of the procedure. Dr. Wilkie Lent assisted with the procedure. Right abdomen, right groin and right neck were prepped and draped in sterile fashion. Ultrasound demonstrated a large pocket of fluid in the right  abdomen. Ultrasound image was saved for documentation. Skin was anesthetized with 1% lidocaine . Using ultrasound guidance, paracentesis catheter was directed into the ascites. Approximately 5 L of yellow fluid was drained during the procedure. Paracentesis catheter was removed after the paracentesis. Bandage placed over the puncture site. Ultrasound confirmed a patent right common femoral vein. Using a combination of fluoroscopy and ultrasound, an access site was determined. A small dermatotomy was made at the planned puncture site. Using ultrasound guidance, access into the right common femoral vein was obtained with visualization of needle entry into the vessel using a standard micropuncture technique. Wire was advanced into the IVC. An 8 French vascular sheath was placed. Through this access site, an 58 French Accunav ICE catheter was advanced with ease under fluoroscopic guidance to the level of the intrahepatic inferior vena cava. A preliminary ultrasound of the right neck was performed and demonstrates a patent internal jugular vein. A permanent ultrasound image was recorded. Using a combination of fluoroscopy and ultrasound, an access site was determined. A small dermatotomy was made at the planned puncture site. Using ultrasound guidance, access into the right internal jugular vein was obtained with  visualization of needle entry into the vessel using a standard micropuncture technique. A wire was advanced into the IVC and serial fascial dilation performed. A 10 French tips sheath was placed into the internal jugular vein and advanced to the IVC. The jugular sheath was retracted into the right atrium and manometry was performed. A 5 French angled tip catheter was then directed into the middle hepatic vein. Hepatic venogram was performed. These images demonstrated a patent hepatic vein with no stenosis. The catheter was advanced to a wedge portion of the patent vein over which the 10 French sheath was advanced into the middle hepatic vein. Using ICE ultrasound visualization the catheter and middle hepatic vein as well as the portal anatomy was defined. A planned exit site from the hepatic vein and puncture site from the portal vein was placed into a single sonographic plane. Under direct ultrasound visualization, the ScorpionX needle was advanced into the portal venous system near the portal vein bifurcation. A Glidewire Advantage was then advanced into the splenic vein. A 5 French marking pigtail catheter was then advanced over the wire into the main portal vein. It was difficult to remove the wire from the pigtail catheter. It was noted that the [facility] Advantage coating had sheared off and was trapped within the pigtail catheter. We were unable to advance a microwire or microcatheter through the pigtail catheter. We attempted to snare the wire coating with micro snare devices but this was also unsuccessful. Back of a stiff Glidewire was advanced into the pigtail catheter adjacent to the sheared off wire coating. The pigtail catheter was advanced further into the portal venous system until the wire was near the portal venous system. The stiff wire never exited the pigtail catheter. The pigtail catheter hub was cut. A 6 French Neuron MAX sheath was advanced over the pigtail catheter and through the 10 French  sheath. The 6 French sheath was successfully advanced over the pigtail catheter and into the portal venous system using fluoroscopy. Once the 6 French sheath was within the portal venous system, the pigtail catheter was completely removed. Portal venogram was performed through the 6 French vascular sheath. Superstiff Amplatz wire was placed. A new pigtail catheter was placed. Portal manometry was then performed. The tract was then dilated to 8 mm with an 8 mm x 8 cm Athletis balloon.  A 8-10 mm by 8 + 2 cm of Viatorr endograft was placed. This was ultimately dilated to 8 mm. After placement of the shunt, right atrial and portal pressures were repeated. Completion portal venogram demonstrates a patent TIPS endograft without significant gastroesophageal varices. The catheters and sheath were removed and manual compression was applied to the right internal jugular and right common femoral venous access sites until hemostasis was achieved. The patient was transferred to the PACU in stable condition. Pre-TIPS Mean Pressures (mmHg): Right atrium: 10 Portal vein: 39 Portosystemic gradient: 29 Post-TIPS Mean Pressures (mmHg): Right atrium:28 Portal vein: 34 Portosystemic gradient: 6 FINDINGS: Main portal vein and TIPS stent were widely patent at the end of the procedure. No significant filling of varices on the final portogram images. 5 L of ascites was removed. IMPRESSION: 1. Successful transjugular portosystemic shunt creation. 2. Portosystemic gradient of 29 mm Hg (absolute portal venous pressure 39 mm Hg) before shunt placement and 6 mm Hg (absolute portal venous pressure 34 mm Hg) after shunt placement. PLAN: Patient will be admitted for overnight observation. Electronically Signed   By: Juliene Balder M.D.   On: 11/19/2023 16:42   IR Paracentesis Result Date: 11/19/2023 CLINICAL DATA:  73 year old with cirrhosis and refractory ascites. Patient requires frequent large volume paracentesis. MELD 3.0 = 15. EXAM: 1.  Ultrasound-guided paracentesis 2. Ultrasound-guided access of the right internal jugular vein 3. Ultrasound-guided access of the right common femoral vein 4. Hepatic venogram 5. Intravascular ultrasound 6. Catheterization of the portal vein 7. Portal venous and central manometry 8. Portal venogram 9. Creation of a transhepatic portal vein to hepatic vein shunt MEDICATIONS: As antibiotic prophylaxis, Rocephin  2 g was ordered pre-procedure and administered intravenously within one hour of incision. ANESTHESIA/SEDATION: General - as administered by the Anesthesia department CONTRAST:  75 mL Omnipaque  300, intravenous FLUOROSCOPY TIME:  Radiation Exposure Index (as provided by the fluoroscopic device): 220 mGy Kerma COMPLICATIONS: None immediate. PROCEDURE: Informed written consent was obtained from the patient after a thorough discussion of the procedural risks, benefits and alternatives. All questions were addressed. Maximal Sterile Barrier Technique was utilized including caps, mask, sterile gowns, sterile gloves, sterile drape, hand hygiene and skin antiseptic. A timeout was performed prior to the initiation of the procedure. Dr. Wilkie Lent assisted with the procedure. Right abdomen, right groin and right neck were prepped and draped in sterile fashion. Ultrasound demonstrated a large pocket of fluid in the right abdomen. Ultrasound image was saved for documentation. Skin was anesthetized with 1% lidocaine . Using ultrasound guidance, paracentesis catheter was directed into the ascites. Approximately 5 L of yellow fluid was drained during the procedure. Paracentesis catheter was removed after the paracentesis. Bandage placed over the puncture site. Ultrasound confirmed a patent right common femoral vein. Using a combination of fluoroscopy and ultrasound, an access site was determined. A small dermatotomy was made at the planned puncture site. Using ultrasound guidance, access into the right common femoral vein  was obtained with visualization of needle entry into the vessel using a standard micropuncture technique. Wire was advanced into the IVC. An 8 French vascular sheath was placed. Through this access site, an 37 French Accunav ICE catheter was advanced with ease under fluoroscopic guidance to the level of the intrahepatic inferior vena cava. A preliminary ultrasound of the right neck was performed and demonstrates a patent internal jugular vein. A permanent ultrasound image was recorded. Using a combination of fluoroscopy and ultrasound, an access site was determined. A small dermatotomy was made at the  planned puncture site. Using ultrasound guidance, access into the right internal jugular vein was obtained with visualization of needle entry into the vessel using a standard micropuncture technique. A wire was advanced into the IVC and serial fascial dilation performed. A 10 French tips sheath was placed into the internal jugular vein and advanced to the IVC. The jugular sheath was retracted into the right atrium and manometry was performed. A 5 French angled tip catheter was then directed into the middle hepatic vein. Hepatic venogram was performed. These images demonstrated a patent hepatic vein with no stenosis. The catheter was advanced to a wedge portion of the patent vein over which the 10 French sheath was advanced into the middle hepatic vein. Using ICE ultrasound visualization the catheter and middle hepatic vein as well as the portal anatomy was defined. A planned exit site from the hepatic vein and puncture site from the portal vein was placed into a single sonographic plane. Under direct ultrasound visualization, the ScorpionX needle was advanced into the portal venous system near the portal vein bifurcation. A Glidewire Advantage was then advanced into the splenic vein. A 5 French marking pigtail catheter was then advanced over the wire into the main portal vein. It was difficult to remove the wire from  the pigtail catheter. It was noted that the [facility] Advantage coating had sheared off and was trapped within the pigtail catheter. We were unable to advance a microwire or microcatheter through the pigtail catheter. We attempted to snare the wire coating with micro snare devices but this was also unsuccessful. Back of a stiff Glidewire was advanced into the pigtail catheter adjacent to the sheared off wire coating. The pigtail catheter was advanced further into the portal venous system until the wire was near the portal venous system. The stiff wire never exited the pigtail catheter. The pigtail catheter hub was cut. A 6 French Neuron MAX sheath was advanced over the pigtail catheter and through the 10 French sheath. The 6 French sheath was successfully advanced over the pigtail catheter and into the portal venous system using fluoroscopy. Once the 6 French sheath was within the portal venous system, the pigtail catheter was completely removed. Portal venogram was performed through the 6 French vascular sheath. Superstiff Amplatz wire was placed. A new pigtail catheter was placed. Portal manometry was then performed. The tract was then dilated to 8 mm with an 8 mm x 8 cm Athletis balloon. A 8-10 mm by 8 + 2 cm of Viatorr endograft was placed. This was ultimately dilated to 8 mm. After placement of the shunt, right atrial and portal pressures were repeated. Completion portal venogram demonstrates a patent TIPS endograft without significant gastroesophageal varices. The catheters and sheath were removed and manual compression was applied to the right internal jugular and right common femoral venous access sites until hemostasis was achieved. The patient was transferred to the PACU in stable condition. Pre-TIPS Mean Pressures (mmHg): Right atrium: 10 Portal vein: 39 Portosystemic gradient: 29 Post-TIPS Mean Pressures (mmHg): Right atrium:28 Portal vein: 34 Portosystemic gradient: 6 FINDINGS: Main portal vein and TIPS  stent were widely patent at the end of the procedure. No significant filling of varices on the final portogram images. 5 L of ascites was removed. IMPRESSION: 1. Successful transjugular portosystemic shunt creation. 2. Portosystemic gradient of 29 mm Hg (absolute portal venous pressure 39 mm Hg) before shunt placement and 6 mm Hg (absolute portal venous pressure 34 mm Hg) after shunt placement. PLAN: Patient will be admitted for  overnight observation. Electronically Signed   By: Juliene Balder M.D.   On: 11/19/2023 16:42   IR INTRAVASCULAR ULTRASOUND NON CORONARY Result Date: 11/19/2023 CLINICAL DATA:  73 year old with cirrhosis and refractory ascites. Patient requires frequent large volume paracentesis. MELD 3.0 = 15. EXAM: 1. Ultrasound-guided paracentesis 2. Ultrasound-guided access of the right internal jugular vein 3. Ultrasound-guided access of the right common femoral vein 4. Hepatic venogram 5. Intravascular ultrasound 6. Catheterization of the portal vein 7. Portal venous and central manometry 8. Portal venogram 9. Creation of a transhepatic portal vein to hepatic vein shunt MEDICATIONS: As antibiotic prophylaxis, Rocephin  2 g was ordered pre-procedure and administered intravenously within one hour of incision. ANESTHESIA/SEDATION: General - as administered by the Anesthesia department CONTRAST:  75 mL Omnipaque  300, intravenous FLUOROSCOPY TIME:  Radiation Exposure Index (as provided by the fluoroscopic device): 220 mGy Kerma COMPLICATIONS: None immediate. PROCEDURE: Informed written consent was obtained from the patient after a thorough discussion of the procedural risks, benefits and alternatives. All questions were addressed. Maximal Sterile Barrier Technique was utilized including caps, mask, sterile gowns, sterile gloves, sterile drape, hand hygiene and skin antiseptic. A timeout was performed prior to the initiation of the procedure. Dr. Wilkie Lent assisted with the procedure. Right abdomen,  right groin and right neck were prepped and draped in sterile fashion. Ultrasound demonstrated a large pocket of fluid in the right abdomen. Ultrasound image was saved for documentation. Skin was anesthetized with 1% lidocaine . Using ultrasound guidance, paracentesis catheter was directed into the ascites. Approximately 5 L of yellow fluid was drained during the procedure. Paracentesis catheter was removed after the paracentesis. Bandage placed over the puncture site. Ultrasound confirmed a patent right common femoral vein. Using a combination of fluoroscopy and ultrasound, an access site was determined. A small dermatotomy was made at the planned puncture site. Using ultrasound guidance, access into the right common femoral vein was obtained with visualization of needle entry into the vessel using a standard micropuncture technique. Wire was advanced into the IVC. An 8 French vascular sheath was placed. Through this access site, an 65 French Accunav ICE catheter was advanced with ease under fluoroscopic guidance to the level of the intrahepatic inferior vena cava. A preliminary ultrasound of the right neck was performed and demonstrates a patent internal jugular vein. A permanent ultrasound image was recorded. Using a combination of fluoroscopy and ultrasound, an access site was determined. A small dermatotomy was made at the planned puncture site. Using ultrasound guidance, access into the right internal jugular vein was obtained with visualization of needle entry into the vessel using a standard micropuncture technique. A wire was advanced into the IVC and serial fascial dilation performed. A 10 French tips sheath was placed into the internal jugular vein and advanced to the IVC. The jugular sheath was retracted into the right atrium and manometry was performed. A 5 French angled tip catheter was then directed into the middle hepatic vein. Hepatic venogram was performed. These images demonstrated a patent hepatic  vein with no stenosis. The catheter was advanced to a wedge portion of the patent vein over which the 10 French sheath was advanced into the middle hepatic vein. Using ICE ultrasound visualization the catheter and middle hepatic vein as well as the portal anatomy was defined. A planned exit site from the hepatic vein and puncture site from the portal vein was placed into a single sonographic plane. Under direct ultrasound visualization, the ScorpionX needle was advanced into the portal venous system near the  portal vein bifurcation. A Glidewire Advantage was then advanced into the splenic vein. A 5 French marking pigtail catheter was then advanced over the wire into the main portal vein. It was difficult to remove the wire from the pigtail catheter. It was noted that the [facility] Advantage coating had sheared off and was trapped within the pigtail catheter. We were unable to advance a microwire or microcatheter through the pigtail catheter. We attempted to snare the wire coating with micro snare devices but this was also unsuccessful. Back of a stiff Glidewire was advanced into the pigtail catheter adjacent to the sheared off wire coating. The pigtail catheter was advanced further into the portal venous system until the wire was near the portal venous system. The stiff wire never exited the pigtail catheter. The pigtail catheter hub was cut. A 6 French Neuron MAX sheath was advanced over the pigtail catheter and through the 10 French sheath. The 6 French sheath was successfully advanced over the pigtail catheter and into the portal venous system using fluoroscopy. Once the 6 French sheath was within the portal venous system, the pigtail catheter was completely removed. Portal venogram was performed through the 6 French vascular sheath. Superstiff Amplatz wire was placed. A new pigtail catheter was placed. Portal manometry was then performed. The tract was then dilated to 8 mm with an 8 mm x 8 cm Athletis balloon. A  8-10 mm by 8 + 2 cm of Viatorr endograft was placed. This was ultimately dilated to 8 mm. After placement of the shunt, right atrial and portal pressures were repeated. Completion portal venogram demonstrates a patent TIPS endograft without significant gastroesophageal varices. The catheters and sheath were removed and manual compression was applied to the right internal jugular and right common femoral venous access sites until hemostasis was achieved. The patient was transferred to the PACU in stable condition. Pre-TIPS Mean Pressures (mmHg): Right atrium: 10 Portal vein: 39 Portosystemic gradient: 29 Post-TIPS Mean Pressures (mmHg): Right atrium:28 Portal vein: 34 Portosystemic gradient: 6 FINDINGS: Main portal vein and TIPS stent were widely patent at the end of the procedure. No significant filling of varices on the final portogram images. 5 L of ascites was removed. IMPRESSION: 1. Successful transjugular portosystemic shunt creation. 2. Portosystemic gradient of 29 mm Hg (absolute portal venous pressure 39 mm Hg) before shunt placement and 6 mm Hg (absolute portal venous pressure 34 mm Hg) after shunt placement. PLAN: Patient will be admitted for overnight observation. Electronically Signed   By: Juliene Balder M.D.   On: 11/19/2023 16:42     Assessment and Plan:  Acute HFpEF IR had mentioned that after TIPS procedure there can be an increase in preload.  She looks volume up with JVD and peripheral edema and has gotten 1 dose of IV Lasix  20 mg.  She feels improved but still requires more diuresis. She will get another dose of IV Lasix  20 mg for total of 40 mg this morning.  Will assess urinary response and start with IV Lasix  40 mg twice daily starting tonight. Continue with spironolactone  100 mg. Echocardiogram and BNP pending. need strict I's and O's and daily weights.  Chest pain She had episode of shortness of breath while ambulating to the bathroom and then layed down and then suddenly had chest  pain.  EKG with no acute ST-T wave changes.  Troponins are pending.  Suspect this may be related to her acute HFpEF but she does certainly have risk factors for CAD.  Will await for  troponins to result before providing recommendations.  With AKI right now would be cautious with ischemic testing requiring contrast but may consider further testing should she have continued complaints of chest pain with negative troponins and after resolve HFpEF exacerbation. Echo will look for wall motion abnormalities but low suspicion for ACS right now.  NASH Cirrhosis status post TIPS procedure 11/7 She may have some element of hepatorenal syndrome with evidence of portal hypertension on CT 08/2022 which would complicate things. However with her TIPS procedure hopefully this is less of an effect.  Will see how she does with the above. She is currently on propranolol  80 mg daily probably for tremors.  She may benefit more from carvedilol.  Will discuss with MD.  Varices April 2025 with grade 2 esophageal varices.  Keep this in mind for any need for TEE in the future.  Risk Assessment/Risk Scores:   [place]  [employer] Association (NYHA) Functional Class NYHA Class III       For questions or updates, please contact [facility] Please consult [email or web address] for contact info under      Signed, Thom LITTIE Sluder, PA-C  11/21/2023 11:04 AM   Attending Note   Patient seen and discussed with PA Sluder, I agree with his documentation. 73 yo female history of cirrhosis, DM, GERD, HLD, bipolar, esopahgeal varices, presented for outpatient elective TIPs procedure. Post procedure onset of SOB and chest pain. DOE with walking back and forth to the bathroom that has progressed. Chest pain midchest lasting 2 hours constant with some variation in severity but constant over the 2 hours.     K 3.5 BUN 16 Cr 1.61 INR 1.5 WBC 5.2 Hgb 9.4 Plt 59 BNP 1472  Trop 41--> EKG SR, no acute ischemic changes CXR trace left  pleural effusion   1.SOB/Chest pain - signs of fluid overload, acute pulmonary edema following TIPs procedure. Pulmonary edema is a risk of TIPs due to fairly rapid increase in preload. 08/2023 echo showed some signs of diastolic dysfunciton, elevated LA pressure. Suspect rapid increase in preload to a ventricle with some known diastolic dysfunction led to her symptoms - 08/2023 echo: LVEF 70-75%, no WMAs, grade I dd, normal RV. Her E/e' average >14 suggesting elevated LA pressures - CXR trace effusion, BNP 1472 - repeat echo is pending - EKG benign, very mild trop in setting of pulmonary edema and volume overload. Follow trop trend and repeat echo, at this time do not suspect ACS.Suspect SOB and chest pains related to volume overload as stated above - f/u echo, trop trend and symptoms with diuresis.  - of note urinated twice prior to pure wick being placed that was not measured/documented  Dorn Ross MD

## 2023-11-21 NOTE — Progress Notes (Signed)
  Echocardiogram 2D Echocardiogram has been performed.  Tinnie FORBES Gosling RDCS 11/21/2023, 3:18 PM

## 2023-11-22 ENCOUNTER — Encounter (HOSPITAL_COMMUNITY): Payer: Self-pay | Admitting: Diagnostic Radiology

## 2023-11-22 DIAGNOSIS — Z95828 Presence of other vascular implants and grafts: Secondary | ICD-10-CM | POA: Diagnosis not present

## 2023-11-22 LAB — CULTURE, BODY FLUID W GRAM STAIN -BOTTLE
Culture: NO GROWTH
Special Requests: ADEQUATE

## 2023-11-22 LAB — PROTIME-INR
INR: 1.4 — ABNORMAL HIGH (ref 0.8–1.2)
Prothrombin Time: 17.9 s — ABNORMAL HIGH (ref 11.4–15.2)

## 2023-11-22 LAB — HEPATIC FUNCTION PANEL
ALT: 36 U/L (ref 0–44)
AST: 53 U/L — ABNORMAL HIGH (ref 15–41)
Albumin: 3 g/dL — ABNORMAL LOW (ref 3.5–5.0)
Alkaline Phosphatase: 82 U/L (ref 38–126)
Bilirubin, Direct: 0.5 mg/dL — ABNORMAL HIGH (ref 0.0–0.2)
Indirect Bilirubin: 1.4 mg/dL — ABNORMAL HIGH (ref 0.3–0.9)
Total Bilirubin: 1.9 mg/dL — ABNORMAL HIGH (ref 0.0–1.2)
Total Protein: 5.2 g/dL — ABNORMAL LOW (ref 6.5–8.1)

## 2023-11-22 LAB — BASIC METABOLIC PANEL WITH GFR
Anion gap: 9 (ref 5–15)
BUN: 16 mg/dL (ref 8–23)
CO2: 21 mmol/L — ABNORMAL LOW (ref 22–32)
Calcium: 8.5 mg/dL — ABNORMAL LOW (ref 8.9–10.3)
Chloride: 110 mmol/L (ref 98–111)
Creatinine, Ser: 1.11 mg/dL — ABNORMAL HIGH (ref 0.44–1.00)
GFR, Estimated: 53 mL/min — ABNORMAL LOW (ref >60)
Glucose, Bld: 155 mg/dL — ABNORMAL HIGH (ref 70–99)
Potassium: 3.3 mmol/L — ABNORMAL LOW (ref 3.5–5.1)
Sodium: 140 mmol/L (ref 135–145)

## 2023-11-22 LAB — CBC
HCT: 26.8 % — ABNORMAL LOW (ref 36.0–46.0)
Hemoglobin: 9 g/dL — ABNORMAL LOW (ref 12.0–15.0)
MCH: 29.4 pg (ref 26.0–34.0)
MCHC: 33.6 g/dL (ref 30.0–36.0)
MCV: 87.6 fL (ref 80.0–100.0)
Platelets: 64 K/uL — ABNORMAL LOW (ref 150–400)
RBC: 3.06 MIL/uL — ABNORMAL LOW (ref 3.87–5.11)
RDW: 16.7 % — ABNORMAL HIGH (ref 11.5–15.5)
WBC: 3.7 K/uL — ABNORMAL LOW (ref 4.0–10.5)
nRBC: 0 % (ref 0.0–0.2)

## 2023-11-22 LAB — MAGNESIUM: Magnesium: 1.5 mg/dL — ABNORMAL LOW (ref 1.7–2.4)

## 2023-11-22 MED ORDER — POTASSIUM CHLORIDE CRYS ER 20 MEQ PO TBCR
40.0000 meq | EXTENDED_RELEASE_TABLET | Freq: Two times a day (BID) | ORAL | Status: DC
Start: 2023-11-22 — End: 2023-11-24
  Administered 2023-11-22 – 2023-11-23 (×3): 40 meq via ORAL
  Filled 2023-11-22 (×3): qty 2

## 2023-11-22 MED ORDER — MAGNESIUM SULFATE 4 GM/100ML IV SOLN
4.0000 g | Freq: Once | INTRAVENOUS | Status: AC
  Administered 2023-11-22: 4 g via INTRAVENOUS
  Filled 2023-11-22: qty 100

## 2023-11-22 MED ORDER — FUROSEMIDE 40 MG PO TABS
40.0000 mg | ORAL_TABLET | Freq: Every day | ORAL | Status: DC
  Administered 2023-11-22 – 2023-11-23 (×2): 40 mg via ORAL
  Filled 2023-11-22 (×2): qty 1

## 2023-11-22 NOTE — Progress Notes (Signed)
 Heart Failure Navigator Progress Note  Assessed for Heart & Vascular TOC clinic readiness.  Patient does not meet criteria due to fluid overload after TIPS, EF 70-75% , No HF TOC.  .   Navigator will sign off at this time.   Stephane Haddock, BSN, Scientist, Clinical (histocompatibility And Immunogenetics) Only

## 2023-11-22 NOTE — Evaluation (Signed)
 Physical Therapy Evaluation Patient Details Name: Andrea Dickson MRN: [id number] DOB: Jan 20, 1950 Today's Date: 11/22/2023  History of Present Illness  73 yo female s/p tips procedure 11/7. Also in HF. X2 recent paracenteses, 11/17/23 (5L), 11/03/23 (5L). PMH significant for decompensated cirrhosis (presumably MASH), DM, GERD, HLD, Bipolar 1.  Clinical Impression  Pt presents with LE weakness, impaired balance with pt-reported history of falls, poor activity tolerance, dyspnea on exertion with accompanying desats requiring 2LO2 during mobility. Pt to benefit from acute PT to address deficits. Pt tolerating x4 transfers throughout session, pt limited by weakness and frequent Bms which pt states are making her weak. Pt lives at home alone, requests return home at d/c. PT encouraged more frequent supervision from family at d/c given pt activity tolerance, weakness, and high fall risk, would also benefit from HHPT. PT to progress mobility as tolerated, and will continue to follow acutely.          If plan is discharge home, recommend the following: A little help with walking and/or transfers;A little help with bathing/dressing/bathroom   Can travel by private vehicle        Equipment Recommendations Rollator (4 wheels)  Recommendations for Other Services       Functional Status Assessment Patient has had a recent decline in their functional status and demonstrates the ability to make significant improvements in function in a reasonable and predictable amount of time.     Precautions / Restrictions Precautions Precautions: Fall Restrictions Weight Bearing Restrictions Per Provider Order: No      Mobility  Bed Mobility Overal bed mobility: Needs Assistance Bed Mobility: Supine to Sit     Supine to sit: Min assist, HOB elevated, Used rails     General bed mobility comments: assist for trunk elevation, completion of LE progression over EOB.    Transfers Overall transfer  level: Needs assistance Equipment used: Rolling walker (2 wheels) Transfers: Sit to/from Stand, Bed to chair/wheelchair/BSC Sit to Stand: Min assist   Step pivot transfers: Min assist       General transfer comment: assist for power up, rise, steady. Stand x4, to and from [facility] x2 for loose BM. Increasingly flexed truncal posture with fatigue, DOE 2/4 with SpO2 drop to 86% on RA, placed on 2LO2    Ambulation/Gait                  Stairs            Wheelchair Mobility     Tilt Bed    Modified Rankin (Stroke Patients Only)       Balance Overall balance assessment: Needs assistance Sitting-balance support: No upper extremity supported, Feet supported Sitting balance-Leahy Scale: Fair     Standing balance support: Bilateral upper extremity supported, During functional activity, Reliant on assistive device for balance Standing balance-Leahy Scale: Poor                               Pertinent Vitals/Pain Pain Assessment Pain Assessment: Faces Faces Pain Scale: Hurts little more Pain Location: buttocks (frequent BMs) Pain Descriptors / Indicators: Discomfort, Sore Pain Intervention(s): Limited activity within patient's tolerance, Monitored during session, Repositioned    Home Living Family/patient expects to be discharged to:: Private residence Living Arrangements: Alone Available Help at Discharge: Family;Friend(s);Available PRN/intermittently (son, sisters, aunt) Type of Home: House Home Access: Level entry       Home Layout: One level Home Equipment: Shower seat;Wheelchair - [employer]  riser;Grab bars - tub/shower;Standard Walker      Prior Function Prior Level of Function : Independent/Modified Independent             Mobility Comments: using walker all the time PTA ADLs Comments: indep; son picks up groceries for her     Extremity/Trunk Assessment   Upper Extremity Assessment Upper Extremity Assessment: Defer to OT  evaluation    Lower Extremity Assessment Lower Extremity Assessment: Generalized weakness    Cervical / Trunk Assessment Cervical / Trunk Assessment: Kyphotic;Other exceptions Cervical / Trunk Exceptions: abdominal distention, related to MATTEL  Communication   Communication Communication: No apparent difficulties    Cognition Arousal: Alert Behavior During Therapy: WFL for tasks assessed/performed   PT - Cognitive impairments: No apparent impairments                         Following commands: Intact       Cueing Cueing Techniques: Verbal cues, Gestural cues     General Comments General comments (skin integrity, edema, etc.): 2LO2 for mobility and left on 2LO2 for dyspnea, RN notified. buttocks red from frequent loose and incontinent BMs, assisted in cleaning pt up and application of cream    Exercises     Assessment/Plan    PT Assessment Patient needs continued PT services  PT Problem List Decreased strength;Decreased mobility;Decreased activity tolerance;Decreased balance;Decreased knowledge of use of DME;Pain;Cardiopulmonary status limiting activity;Decreased safety awareness       PT Treatment Interventions DME instruction;Gait training;Therapeutic activities;Therapeutic exercise;Patient/family education;Balance training;Stair training;Functional mobility training;Neuromuscular re-education    PT Goals (Current goals can be found in the Care Plan section)  Acute Rehab PT Goals PT Goal Formulation: With patient Time For Goal Achievement: 12/06/23 Potential to Achieve Goals: Good    Frequency Min 2X/week     Co-evaluation               AM-PAC PT 6 Clicks Mobility  Outcome Measure Help needed turning from your back to your side while in a flat bed without using bedrails?: A Little Help needed moving from lying on your back to sitting on the side of a flat bed without using bedrails?: A Little Help needed moving to and from a bed to a chair  (including a wheelchair)?: A Little Help needed standing up from a chair using your arms (e.g., wheelchair or bedside chair)?: A Little Help needed to walk in hospital room?: A Lot Help needed climbing 3-5 steps with a railing? : Total 6 Click Score: 15    End of Session   Activity Tolerance: Patient tolerated treatment well Patient left: in chair;with call bell/phone within reach;with chair alarm set Nurse Communication: Mobility status PT Visit Diagnosis: Other abnormalities of gait and mobility (R26.89);Muscle weakness (generalized) (M62.81)    Time: 8585-8553 PT Time Calculation (min) (ACUTE ONLY): 32 min   Charges:   PT Evaluation $PT Eval Low Complexity: 1 Low PT Treatments $Therapeutic Activity: 8-22 mins PT General Charges $$ ACUTE PT VISIT: 1 Visit         Johana RAMAN, PT DPT Acute Rehabilitation Services Secure Chat Preferred  Office [phone]   Aithana Kushner E Stroup 11/22/2023, 4:07 PM

## 2023-11-22 NOTE — Progress Notes (Signed)
 OT Cancellation Note  Patient Details Name: Andrea Dickson MRN: [id number] DOB: October 01, 1950   Cancelled Treatment:    Reason Eval/Treat Not Completed: Other (comment) (Pt just finishing with PT on arrival. Pt was limited by multiple BMs and now exhausted. OT evaluation to f/u once pt is able to tolerate.)  [name] 11/22/2023, 2:54 PM

## 2023-11-22 NOTE — Progress Notes (Signed)
 PROGRESS NOTE    Andrea Dickson  FMW:[id number] DOB: 1950/04/15 DOA: 11/19/2023 PCP: Lari Elspeth BRAVO, MD    Brief Narrative:  73 year old with history of Nash cirrhosis, obesity, diabetes, hypertension hyperlipidemia GERD, chronic diastolic dysfunction, bipolar disorder who was admitted for TIPS procedure by radiology.  Underwent TIPS on 11/7 in the setting of decompensated cirrhosis with ascites and was overnight monitored. Patient was noted to be having worsening shortness of breath with exertion, concern for volume overload so she was admitted to the hospital 11/9. Chest x-ray showed trace left pleural effusion.  Started on IV diuresis.  Cardiology also consulted.  Subjective: Patient seen in the morning rounds.  She has various complaints.  She tells me that she gets short of breath even on moving or changing posture.  Feels overall better than yesterday.  Patient tells me she urinated many times and cannot keep doing this at home. Assessment & Plan:   Fluid overload, multifactorial but mostly related to decompensated cirrhosis.  Patient has grade 1 diastolic dysfunction with normal ejection fraction. Probably aggravated by TIPS procedure, increasing preload. Patient was treated with IV Lasix , spironolactone .  Repeat echocardiogram is stable.  Renal function is stable.  Still with some evidence of fluid overload. Continue diuretics, wean off oxygen.  Mobilize with PT OT for safe disposition plan.  Hypokalemia and hypomagnesemia: Replace aggressively and monitor levels.  Acute kidney injury: Secondary to #1.  Treated with diuresis with already improvement.  NASH cirrhosis status post TIPS: Symptomatic management, spironolactone , propranolol , lactulose, PPI.  Paracentesis 11/7.  Bipolar disorder: On bupropion , Remeron  and diazepam .    DVT prophylaxis: SCDs Start: 11/22/23 1143   Code Status: Full code Family Communication: None at the bedside Disposition Plan: Status is:  Inpatient Remains inpatient appropriate because: Diuresis, mobility, oxygen need     Consultants:  Cardiology  Procedures:  TIPS 11/7  Antimicrobials:  None, patient on maintenance trimethoprim .     Objective: Vitals:   11/22/23 0654 11/22/23 0700 11/22/23 0800 11/22/23 1000  BP:  (!) 132/97 (!) 105/41 (!) 118/48  Pulse:  79 79 78  Resp:   20   Temp:      TempSrc:      SpO2:  96% 97% 97%  Weight: 72.1 kg     Height:        Intake/Output Summary (Last 24 hours) at 11/22/2023 1150 Last data filed at 11/22/2023 0900 Gross per 24 hour  Intake 600 ml  Output 700 ml  Net -100 ml   Filed Weights   11/18/23 1040 11/19/23 0613 11/22/23 0654  Weight: 72.6 kg 70.3 kg 72.1 kg    Examination:  General exam: Appears calm and comfortable.  Chronically sick looking.  Frail.  Not in any distress. Respiratory system: Clear to auscultation. Respiratory effort normal.  No added sounds.  On minimum oxygen. Cardiovascular system: S1 & S2 heard, RRR.  No peripheral edema.  Hyperdynamic precordium. Gastrointestinal system: Abdomen is nondistended, soft and nontender. No organomegaly or masses felt. Normal bowel sounds heard. No evidence of tense ascites. Central nervous system: Alert and oriented. No focal neurological deficits.     Data Reviewed: I have personally reviewed following labs and imaging studies  CBC: Recent Labs  Lab 11/19/23 0630 11/19/23 0857 11/19/23 1033 11/20/23 0550 11/21/23 0418 11/22/23 0528  WBC 3.2*  --   --  3.7* 5.2 3.7*  NEUTROABS 2.5  --   --   --   --   --   HGB 10.8* 8.5*  7.8* 8.5* 9.4* 9.0*  HCT 33.1* 25.0* 23.0* 26.2* 28.5* 26.8*  MCV 89.5  --   --  90.3 89.1 87.6  PLT 73*  --   --  60* 59* 64*   Basic Metabolic Panel: Recent Labs  Lab 11/19/23 0630 11/19/23 0857 11/19/23 1033 11/20/23 0550 11/21/23 0418 11/22/23 0528  NA 140 146* 145 136 139 140  K 4.0 3.7 3.9 4.2 3.5 3.3*  CL 107  --   --  108 109 110  CO2 22  --   --  16*  20* 21*  GLUCOSE 149*  --   --  194* 196* 155*  BUN 11  --   --  15 16 16   CREATININE 1.31*  --   --  1.41* 1.61* 1.11*  CALCIUM  8.8*  --   --  8.4* 8.9 8.5*  MG  --   --   --   --   --  1.5*   GFR: Estimated Creatinine Clearance: 46.6 mL/min (A) (by C-G formula based on SCr of 1.11 mg/dL (H)). Liver Function Tests: Recent Labs  Lab 11/19/23 0630 11/20/23 0550 11/21/23 0418 11/22/23 0528  AST 24 31 58* 53*  ALT 14 11 27  36  ALKPHOS 112 63 87 82  BILITOT 1.9* 1.3* 1.5* 1.9*  PROT 6.6 5.4* 5.6* 5.2*  ALBUMIN  3.5 3.4* 3.5 3.0*   No results for input(s): LIPASE, AMYLASE in the last 168 hours. No results for input(s): AMMONIA in the last 168 hours. Coagulation Profile: Recent Labs  Lab 11/19/23 0630 11/20/23 0550 11/21/23 0418 11/22/23 0528  INR 1.2 1.4* 1.5* 1.4*   Cardiac Enzymes: No results for input(s): CKTOTAL, CKMB, CKMBINDEX, TROPONINI in the last 168 hours. BNP (last 3 results) No results for input(s): PROBNP in the last 8760 hours. HbA1C: No results for input(s): HGBA1C in the last 72 hours. CBG: Recent Labs  Lab 11/19/23 0602 11/19/23 0818 11/19/23 1220  GLUCAP 137* 143* 136*   Lipid Profile: No results for input(s): CHOL, HDL, LDLCALC, TRIG, CHOLHDL, LDLDIRECT in the last 72 hours. Thyroid  Function Tests: No results for input(s): TSH, T4TOTAL, FREET4, T3FREE, THYROIDAB in the last 72 hours. Anemia Panel: No results for input(s): VITAMINB12, FOLATE, FERRITIN, TIBC, IRON, RETICCTPCT in the last 72 hours. Sepsis Labs: No results for input(s): PROCALCITON, LATICACIDVEN in the last 168 hours.  Recent Results (from the past 240 hours)  Gram stain     Status: None   Collection Time: 11/17/23  9:00 AM   Specimen: Ascitic  Result Value Ref Range Status   Specimen Description ASCITIC  Final   Special Requests NONE  Final   Gram Stain   Final    CYTOSPIN SMEAR NO ORGANISMS SEEN WBC PRESENT,BOTH PMN  AND MONONUCLEAR Performed at [facility], [address], [place], [place] [address]    Report Status 11/17/2023 FINAL  Final  Culture, body fluid w Gram Stain-bottle     Status: None   Collection Time: 11/17/23  9:00 AM   Specimen: Ascitic  Result Value Ref Range Status   Specimen Description ASCITIC  Final   Special Requests   Final    BOTTLES DRAWN AEROBIC AND ANAEROBIC Blood Culture adequate volume   Culture   Final    NO GROWTH 5 DAYS Performed at [facility], [address] [place], [place] [address]    Report Status 11/22/2023 FINAL  Final         Radiology Studies: ECHOCARDIOGRAM COMPLETE Result Date: 11/21/2023    ECHOCARDIOGRAM  REPORT   Patient Name:   Andrea Dickson Date of Exam: 11/21/2023 Medical Rec #:  [id number]          Height:       66.0 in Accession #:    [id number]         Weight:       155.0 lb Date of Birth:  1950/12/21          BSA:          1.794 m Patient Age:    73 years           BP:           115/51 mmHg Patient Gender: F                  HR:           83 bpm. Exam Location:  Inpatient Procedure: 2D Echo, Color Doppler and Cardiac Doppler (Both Spectral and Color            Flow Doppler were utilized during procedure). Indications:    Dyspnea R06.00  History:        Patient has prior history of Echocardiogram examinations, most                 recent 08/31/2023. Risk Factors:Dyslipidemia and Diabetes.  Sonographer:    Tinnie Gosling RDCS Referring Phys: [id number] DORN FALCON BRANCH IMPRESSIONS  1. Left ventricular ejection fraction, by estimation, is 70 to 75%. The left ventricle has hyperdynamic function. The left ventricle has no regional wall motion abnormalities. Left ventricular diastolic parameters are indeterminate.  2. Right ventricular systolic function is normal. The right ventricular size is normal. Tricuspid regurgitation signal is inadequate for assessing PA pressure.  3. The mitral valve is normal in structure. No evidence of mitral valve regurgitation.  No evidence of mitral stenosis.  4. The tricuspid valve is abnormal.  5. The aortic valve is tricuspid. Aortic valve regurgitation is not visualized. No aortic stenosis is present.  6. The inferior vena cava is dilated in size with <50% respiratory variability, suggesting right atrial pressure of 15 mmHg. FINDINGS  Left Ventricle: Hyperdynamic LV function creates mild intracavitary gradient, peak 17 mmHg. Left ventricular ejection fraction, by estimation, is 70 to 75%. The left ventricle has hyperdynamic function. The left ventricle has no regional wall motion abnormalities. The left ventricular internal cavity size was normal in size. There is no left ventricular hypertrophy. Left ventricular diastolic parameters are indeterminate. Right Ventricle: The right ventricular size is normal. Right vetricular wall thickness was not well visualized. Right ventricular systolic function is normal. Tricuspid regurgitation signal is inadequate for assessing PA pressure. Left Atrium: Left atrial size was normal in size. Right Atrium: Right atrial size was normal in size. Pericardium: There is no evidence of pericardial effusion. Mitral Valve: The mitral valve is normal in structure. No evidence of mitral valve regurgitation. No evidence of mitral valve stenosis. Tricuspid Valve: The tricuspid valve is abnormal. Tricuspid valve regurgitation is mild . No evidence of tricuspid stenosis. Aortic Valve: The aortic valve is tricuspid. Aortic valve regurgitation is not visualized. No aortic stenosis is present. Aortic valve mean gradient measures 6.1 mmHg. Aortic valve peak gradient measures 13.7 mmHg. Aortic valve area, by VTI measures 2.74  cm. Pulmonic Valve: The pulmonic valve was not well visualized. Pulmonic valve regurgitation is not visualized. No evidence of pulmonic stenosis. Aorta: The aortic root and ascending aorta are structurally normal, with no evidence of dilitation. Venous: The  inferior vena cava is dilated in size  with less than 50% respiratory variability, suggesting right atrial pressure of 15 mmHg. IAS/Shunts: No atrial level shunt detected by color flow Doppler.  LEFT VENTRICLE PLAX 2D LVIDd:         3.90 cm   Diastology LVIDs:         2.30 cm   LV e' medial:    7.72 cm/s LV PW:         1.10 cm   LV E/e' medial:  14.8 LV IVS:        0.90 cm   LV e' lateral:   12.40 cm/s LVOT diam:     1.80 cm   LV E/e' lateral: 9.2 LV SV:         73 LV SV Index:   40 LVOT Area:     2.54 cm LV IVRT:       85 msec  RIGHT VENTRICLE             IVC RV S prime:     13.60 cm/s  IVC diam: 2.60 cm TAPSE (M-mode): 2.2 cm                             PULMONARY VEINS                             Diastolic Velocity: 86.20 cm/s                             S/D Velocity:       0.90                             Systolic Velocity:  80.70 cm/s LEFT ATRIUM             Index        RIGHT ATRIUM           Index LA diam:        3.80 cm 2.12 cm/m   RA Area:     10.20 cm LA Vol (A2C):   71.7 ml 39.96 ml/m  RA Volume:   20.10 ml  11.20 ml/m LA Vol (A4C):   45.9 ml 25.58 ml/m LA Biplane Vol: 57.6 ml 32.10 ml/m  AORTIC VALVE AV Area (Vmax):    1.85 cm AV Area (Vmean):   2.22 cm AV Area (VTI):     2.74 cm AV Vmax:           185.37 cm/s AV Vmean:          111.443 cm/s AV VTI:            0.265 m AV Peak Grad:      13.7 mmHg AV Mean Grad:      6.1 mmHg LVOT Vmax:         135.00 cm/s LVOT Vmean:        97.300 cm/s LVOT VTI:          0.285 m LVOT/AV VTI ratio: 1.08  AORTA Ao Root diam: 2.60 cm Ao Asc diam:  2.70 cm MITRAL VALVE MV Area (PHT): 3.89 cm     SHUNTS MV Decel Time: 195 msec     Systemic VTI:  0.29 m MV E velocity: 114.00 cm/s  Systemic Diam: 1.80 cm MV A velocity: 93.80  cm/s MV E/A ratio:  1.22 Dorn Ross MD Electronically signed by Dorn Ross MD Signature Date/Time: 11/21/2023/4:21:06 PM    Final    DG Chest Port 1 View Result Date: 11/21/2023 CLINICAL DATA:  10026 Shortness of breath 10026 EXAM: PORTABLE CHEST 1 VIEW COMPARISON:  June 28, 2023 FINDINGS: The cardiomediastinal silhouette is unchanged in contour. Trace LEFT pleural effusion. No pneumothorax. No acute pleuroparenchymal abnormality. IMPRESSION: Trace LEFT pleural effusion. Electronically Signed   By: Corean Salter M.D.   On: 11/21/2023 10:06        Scheduled Meds:  buPROPion   150 mg Oral BH-q7a   ezetimibe  10 mg Oral QHS   furosemide   40 mg Intravenous BID   lactulose  30 g Oral TID   lipase/protease/amylase  60,000 Units Oral TID AC   mirtazapine   15 mg Oral QHS   pantoprazole   40 mg Oral Daily   potassium chloride  40 mEq Oral BID   propranolol  ER  80 mg Oral Daily   spironolactone   100 mg Oral Daily   trimethoprim   100 mg Oral QHS   Continuous Infusions:  magnesium sulfate bolus IVPB 4 g (11/22/23 1014)     LOS: 3 days    Time spent: 50 minutes    Renato Applebaum, MD [facility]

## 2023-11-22 NOTE — Progress Notes (Addendum)
 Progress Note  Patient Name: Andrea Dickson Date of Encounter: 11/22/2023 [facility] Cardiologist: Alvan Carrier, MD   Interval Summary   Andrea Dickson is a 73 y.o. female with a hx of Hollie, cirrhosis with ascites s/p TIPs , hyperlipidemia, former smoker, type 2 diabetes mellitus, bipolar disorder,  varices, GERD, tremors who was evaluated due to chest pain and shortness of breath.   Patient was stating on initial evaluation this morning that she was having chest pain that was left sided and radiating to her axilla. Patient states that the pain comes and goes without any triggers. She has mostly been in her bed except up to bedside commode since her procedure. She states that the pain often goes away on its own. She does not associate the pain with SOB although does state that yesterday she had some palpitations. On re-evaluation patient states that the pain is gone but she knows it will come back.   Was able to wean oxygen down to room air and patient's O2 saturations were in the mid 90s consistently  Vital Signs Vitals:   11/22/23 0654 11/22/23 0700 11/22/23 0800 11/22/23 1000  BP:  (!) 132/97 (!) 105/41 (!) 118/48  Pulse:  79 79 78  Resp:   20   Temp:      TempSrc:      SpO2:  96% 97% 97%  Weight: 72.1 kg     Height:        Intake/Output Summary (Last 24 hours) at 11/22/2023 1220 Last data filed at 11/22/2023 0900 Gross per 24 hour  Intake 600 ml  Output 700 ml  Net -100 ml      11/22/2023    6:54 AM 11/19/2023    6:13 AM 11/18/2023   10:40 AM  Last 3 Weights  Weight (lbs) 158 lb 15.2 oz 155 lb 160 lb  Weight (kg) 72.1 kg 70.308 kg 72.576 kg      Telemetry/ECG  Sinus rhythm with paroxysmal atrial tachycardia  - Personally Reviewed  Physical Exam  GEN: No acute distress.   Cardiac: regular rhythm upon evaluation and rate , no murmurs appreciated Respiratory: Clear to auscultation bilaterally, diminished breath sounds at the bases   GI: Soft,  nontender, no fluid wave appreciated  MS: 1+ edema on right lower extremity, right leg bigger than left  No asterixis noted on exam  Assessment & Plan  Acute HFpEF Does have mild peripheral edema present on exam, overall volume status seems improved from yesterday. She was started on IV lasix  40 mg BID.Appear to have gained about two kilograms in the last couple of days but unsure if these are bed weights which could make them inaccurate. At rest, patient is able to communicate and is not tachypnea when weaned down to room air, with oxygen saturations sustained in the mid 90s.  Seems to have had about 700 mLs urine output yesterday. Plan:  - EF shows 70 to 75% with hyperdynamic function and no regional wall motion abnormalities.  No LVH.  Intra cavitary gradient of peak of 17 mmHg.  Mild tricuspid valve regurgitation.   -BNP was 1472.3 yesterday. Also acute phase reactant to may not be entirely related to volume overload - Will discontinue IV Lasix  and transition to Lasix  40 mg daily.  Is also on spironolactone  100 mg daily with her hx of cirrhosis. - Continue strict I's and O's and daily weights  Chest Pain Patient states that she is having left-sided chest pain with radiation to  the axilla this morning.  Denies any triggering factors and states that the pain comes and goes on its own.  Upon reevaluation patient states that the pain is gone but she is sure that it will come back.  Has some tenderness to palpation on exam in this area.  Likely more related to MSK.  Do not believe this is ACS, although patient has risk factors for CAD. Echo did not show any wall motion abnormalities. Troponins yesterday were minimally elevated and were flat and trending down  Plan:  -Repeat EKG this morning did not show any acute ischemic signs and was in sinus rhythm. - continue to monitor   NASH Cirrhosis status post TIPS procedure on 11/7 Patient states that she is confused at times which has happened in the  last day or so.  Does not have any signs of asterixis on exam. IT was thought patient could have component of hepatorenal syndrome which should hopefully improve after tips.   Varices Patient has grade 2 esophageal varices noted in April 2025    For questions or updates, please contact [facility] Please consult [email or web address] for contact info under         Signed, Stanly DELENA Leavens, MD    I have personally evaluated and examined the patient. The history, physical exam, and medical decision making documented below were performed independently and substantively by me. I have reviewed all relevant data, formulated the assessment and plan, and assumed responsibility for the management of this patient. My documentation reflects the substantive portion of the split/shared visit, in accordance with CMS and CPT guidelines. I I have personally performed the substantive portion of the medical decision making, including interpretation of diagnostic data, formulation of the management plan, and assessment of risks. In summation:  Notes over all that she feels poorly. No Chest pain. Stable SOB (has Weaned to room air sats of 97 %) No palpitations.  Exam notable for  Gen: no distress Cardiac: No Rubs or Gallops +2 radial pulses Respiratory: Clear to auscultation bilaterally, normal effort, normal  respiratory rate GI: Soft, nontender, non-distended  MS: No  edema;  moves all extremities Integument: Skin feels warm Neuro:  At time of evaluation, alert and oriented to person/place/time/situation  Psych: Normal affect, patient feels ok   In assessment and plan:   Chest pain - no angina currently resolves normal EKG, given her varices unclear is she would be cleared for CAD eva, no high risk features on EKG, outpatient f/u with Dr. Alvan [facility])  Acute HFpEF - euvolemic; in the setting of cirrhosis on MRA as per lasix  transition to PO today  AT and PAC - asymptomatic, no  changes  Will arrange outpatient f/u with Dr. Ranae team.     Stanly Leavens, MD FASE [facility] Cardiologist [facility]  [facility]  [address] [place], [address] [place], [place] [address] [phone]  12:20 PM

## 2023-11-22 NOTE — Progress Notes (Signed)
 Referring Physician(s): Dr MARLA Applebaum  Supervising Physician: Jenna Hacker  Patient Status:  [facility] - In-pt  Chief Complaint:  Decompensated cirrhosis Recurrent ascites Post TIPS 11/19/23 in IR  Subjective:  Up in chair Still weak Feeling some better today [facility] admitted and following Breathing easier Renal fxn stable  Will continue to follow with [facility]   Allergies: Pramipexole, Crestor [rosuvastatin calcium ], Bee pollen, and Pollen extract  Medications: Prior to Admission medications   Medication Sig Start Date End Date Taking? Authorizing Provider  acetaminophen  (TYLENOL ) 500 MG tablet Take 1,000 mg by mouth 2 (two) times daily as needed for moderate pain or headache.   Yes [provider]  buPROPion  (WELLBUTRIN  XL) 150 MG 24 hr tablet Take 1 tablet (150 mg total) by mouth every morning. 11/03/23 11/02/24 Yes Okey Barnie SAUNDERS, MD  Cholecalciferol (VITAMIN D3) 50 MCG (2000 UT) TABS Take 2,000 Units by mouth daily.    Yes [provider]  diazepam  (VALIUM ) 2 MG tablet Take 1 tablet (2 mg total) by mouth daily as needed for anxiety. 11/03/23 11/02/24 Yes Okey Barnie SAUNDERS, MD  estrogens , conjugated, (PREMARIN ) 0.625 MG tablet Take 0.625 mg by mouth daily.   Yes [provider]  ezetimibe (ZETIA) 10 MG tablet Take 10 mg by mouth at bedtime. 10/19/19  Yes [provider]  fluticasone (FLONASE) 50 MCG/ACT nasal spray Place 2 sprays into both nostrils daily. 09/03/22  Yes [provider]  furosemide  (LASIX ) 40 MG tablet Take 1 tablet (40 mg total) by mouth daily. 06/09/23 06/08/24 Yes Mahon, Charmaine CROME, NP  hyoscyamine  (LEVSIN  SL) 0.125 MG SL tablet Place 1 tablet (0.125 mg total) under the tongue every 6 (six) hours as needed for cramping. 09/17/22  Yes Shirlean Therisa ORN, NP  Lactobacillus-Inulin (CULTURELLE DIGESTIVE HEALTH) CAPS Take 1 capsule by mouth daily. 11/10/21  Yes [provider]  mirtazapine  (REMERON ) 15 MG tablet Take 1  tablet (15 mg total) by mouth at bedtime. 11/03/23  Yes Okey Barnie SAUNDERS, MD  omeprazole  (PRILOSEC) 40 MG capsule TAKE ONE (1) CAPSULE BY MOUTH TWICE DAILY AT [facility] AND AT BEDTIME 07/22/23  Yes Mahon, Charmaine CROME, NP  ondansetron  (ZOFRAN ) 4 MG tablet TAKE 1 TABLET BY MOUTH EVERY 8 HOURS AS NEEDED FOR NAUSEA & VOMITING 12/30/22  Yes Kennedy Charmaine CROME, NP  Pancrelipase , Lip-Prot-Amyl, (ZENPEP ) 60000-189600 units CPEP Take 1 capsule by mouth 3 (three) times daily with meals. 02/01/23  Yes Mahon, Charmaine CROME, NP  propranolol  ER (INDERAL  LA) 80 MG 24 hr capsule Take 1 capsule (80 mg total) by mouth daily. 11/03/23 11/02/24 Yes Okey Barnie SAUNDERS, MD  spironolactone  (ALDACTONE ) 100 MG tablet Take 1 tablet (100 mg total) by mouth daily. 06/09/23 06/08/24 Yes Mahon, Charmaine CROME, NP  cetirizine (ZYRTEC) 10 MG tablet Take 10 mg by mouth daily. Aller-Tec Patient not taking: Reported on 11/16/2023    [provider]  lactulose (CHRONULAC) 10 GM/15ML solution Take 45 mLs (30 g total) by mouth 3 (three) times daily. 11/20/23   Carim, Charles A, PA-C  trimethoprim  (TRIMPEX ) 100 MG tablet TAKE 1 TABLET BY MOUTH EVERY DAY AT BEDTIME Patient not taking: Reported on 11/16/2023 06/29/23   Gerldine Lauraine BROCKS, FNP     Vital Signs: BP 121/83 (BP Location: Right Arm)   Pulse 76   Temp 98.7 F (37.1 C) (Oral)   Resp 20   Ht 5' 6 (1.676 m)   Wt 158 lb 15.2 oz (72.1 kg)   SpO2 93%   BMI  25.66 kg/m   Physical Exam Vitals reviewed.  HENT:     Mouth/Throat:     Mouth: Mucous membranes are moist.  Pulmonary:     Effort: Pulmonary effort is normal.     Breath sounds: No wheezing.  Abdominal:     General: There is distension.     Palpations: Abdomen is soft.     Tenderness: There is no abdominal tenderness.  Skin:    General: Skin is warm and dry.  Neurological:     Mental Status: She is alert and oriented to person, place, and time.  Psychiatric:        Behavior: Behavior normal.      Imaging: ECHOCARDIOGRAM COMPLETE Result Date: 11/21/2023    ECHOCARDIOGRAM REPORT   Patient Name:   Andrea Dickson Date of Exam: 11/21/2023 Medical Rec #:  [id number]          Height:       66.0 in Accession #:    [id number]         Weight:       155.0 lb Date of Birth:  23-Dec-1950          BSA:          1.794 m Patient Age:    73 years           BP:           115/51 mmHg Patient Gender: F                  HR:           83 bpm. Exam Location:  Inpatient Procedure: 2D Echo, Color Doppler and Cardiac Doppler (Both Spectral and Color            Flow Doppler were utilized during procedure). Indications:    Dyspnea R06.00  History:        Patient has prior history of Echocardiogram examinations, most                 recent 08/31/2023. Risk Factors:Dyslipidemia and Diabetes.  Sonographer:    Tinnie Gosling RDCS Referring Phys: [id number] DORN FALCON BRANCH IMPRESSIONS  1. Left ventricular ejection fraction, by estimation, is 70 to 75%. The left ventricle has hyperdynamic function. The left ventricle has no regional wall motion abnormalities. Left ventricular diastolic parameters are indeterminate.  2. Right ventricular systolic function is normal. The right ventricular size is normal. Tricuspid regurgitation signal is inadequate for assessing PA pressure.  3. The mitral valve is normal in structure. No evidence of mitral valve regurgitation. No evidence of mitral stenosis.  4. The tricuspid valve is abnormal.  5. The aortic valve is tricuspid. Aortic valve regurgitation is not visualized. No aortic stenosis is present.  6. The inferior vena cava is dilated in size with <50% respiratory variability, suggesting right atrial pressure of 15 mmHg. FINDINGS  Left Ventricle: Hyperdynamic LV function creates mild intracavitary gradient, peak 17 mmHg. Left ventricular ejection fraction, by estimation, is 70 to 75%. The left ventricle has hyperdynamic function. The left ventricle has no regional wall motion abnormalities. The  left ventricular internal cavity size was normal in size. There is no left ventricular hypertrophy. Left ventricular diastolic parameters are indeterminate. Right Ventricle: The right ventricular size is normal. Right vetricular wall thickness was not well visualized. Right ventricular systolic function is normal. Tricuspid regurgitation signal is inadequate for assessing PA pressure. Left Atrium: Left atrial size was normal in size. Right Atrium: Right atrial size was normal  in size. Pericardium: There is no evidence of pericardial effusion. Mitral Valve: The mitral valve is normal in structure. No evidence of mitral valve regurgitation. No evidence of mitral valve stenosis. Tricuspid Valve: The tricuspid valve is abnormal. Tricuspid valve regurgitation is mild . No evidence of tricuspid stenosis. Aortic Valve: The aortic valve is tricuspid. Aortic valve regurgitation is not visualized. No aortic stenosis is present. Aortic valve mean gradient measures 6.1 mmHg. Aortic valve peak gradient measures 13.7 mmHg. Aortic valve area, by VTI measures 2.74  cm. Pulmonic Valve: The pulmonic valve was not well visualized. Pulmonic valve regurgitation is not visualized. No evidence of pulmonic stenosis. Aorta: The aortic root and ascending aorta are structurally normal, with no evidence of dilitation. Venous: The inferior vena cava is dilated in size with less than 50% respiratory variability, suggesting right atrial pressure of 15 mmHg. IAS/Shunts: No atrial level shunt detected by color flow Doppler.  LEFT VENTRICLE PLAX 2D LVIDd:         3.90 cm   Diastology LVIDs:         2.30 cm   LV e' medial:    7.72 cm/s LV PW:         1.10 cm   LV E/e' medial:  14.8 LV IVS:        0.90 cm   LV e' lateral:   12.40 cm/s LVOT diam:     1.80 cm   LV E/e' lateral: 9.2 LV SV:         73 LV SV Index:   40 LVOT Area:     2.54 cm LV IVRT:       85 msec  RIGHT VENTRICLE             IVC RV S prime:     13.60 cm/s  IVC diam: 2.60 cm TAPSE  (M-mode): 2.2 cm                             PULMONARY VEINS                             Diastolic Velocity: 86.20 cm/s                             S/D Velocity:       0.90                             Systolic Velocity:  80.70 cm/s LEFT ATRIUM             Index        RIGHT ATRIUM           Index LA diam:        3.80 cm 2.12 cm/m   RA Area:     10.20 cm LA Vol (A2C):   71.7 ml 39.96 ml/m  RA Volume:   20.10 ml  11.20 ml/m LA Vol (A4C):   45.9 ml 25.58 ml/m LA Biplane Vol: 57.6 ml 32.10 ml/m  AORTIC VALVE AV Area (Vmax):    1.85 cm AV Area (Vmean):   2.22 cm AV Area (VTI):     2.74 cm AV Vmax:           185.37 cm/s AV Vmean:          111.443 cm/s AV  VTI:            0.265 m AV Peak Grad:      13.7 mmHg AV Mean Grad:      6.1 mmHg LVOT Vmax:         135.00 cm/s LVOT Vmean:        97.300 cm/s LVOT VTI:          0.285 m LVOT/AV VTI ratio: 1.08  AORTA Ao Root diam: 2.60 cm Ao Asc diam:  2.70 cm MITRAL VALVE MV Area (PHT): 3.89 cm     SHUNTS MV Decel Time: 195 msec     Systemic VTI:  0.29 m MV E velocity: 114.00 cm/s  Systemic Diam: 1.80 cm MV A velocity: 93.80 cm/s MV E/A ratio:  1.22 Dorn Ross MD Electronically signed by Dorn Ross MD Signature Date/Time: 11/21/2023/4:21:06 PM    Final    DG Chest Port 1 View Result Date: 11/21/2023 CLINICAL DATA:  10026 Shortness of breath 10026 EXAM: PORTABLE CHEST 1 VIEW COMPARISON:  June 28, 2023 FINDINGS: The cardiomediastinal silhouette is unchanged in contour. Trace LEFT pleural effusion. No pneumothorax. No acute pleuroparenchymal abnormality. IMPRESSION: Trace LEFT pleural effusion. Electronically Signed   By: Corean Salter M.D.   On: 11/21/2023 10:06   IR Tips Result Date: 11/19/2023 CLINICAL DATA:  73 year old with cirrhosis and refractory ascites. Patient requires frequent large volume paracentesis. MELD 3.0 = 15. EXAM: 1. Ultrasound-guided paracentesis 2. Ultrasound-guided access of the right internal jugular vein 3. Ultrasound-guided access of  the right common femoral vein 4. Hepatic venogram 5. Intravascular ultrasound 6. Catheterization of the portal vein 7. Portal venous and central manometry 8. Portal venogram 9. Creation of a transhepatic portal vein to hepatic vein shunt MEDICATIONS: As antibiotic prophylaxis, Rocephin  2 g was ordered pre-procedure and administered intravenously within one hour of incision. ANESTHESIA/SEDATION: General - as administered by the Anesthesia department CONTRAST:  75 mL Omnipaque  300, intravenous FLUOROSCOPY TIME:  Radiation Exposure Index (as provided by the fluoroscopic device): 220 mGy Kerma COMPLICATIONS: None immediate. PROCEDURE: Informed written consent was obtained from the patient after a thorough discussion of the procedural risks, benefits and alternatives. All questions were addressed. Maximal Sterile Barrier Technique was utilized including caps, mask, sterile gowns, sterile gloves, sterile drape, hand hygiene and skin antiseptic. A timeout was performed prior to the initiation of the procedure. Dr. Wilkie Lent assisted with the procedure. Right abdomen, right groin and right neck were prepped and draped in sterile fashion. Ultrasound demonstrated a large pocket of fluid in the right abdomen. Ultrasound image was saved for documentation. Skin was anesthetized with 1% lidocaine . Using ultrasound guidance, paracentesis catheter was directed into the ascites. Approximately 5 L of yellow fluid was drained during the procedure. Paracentesis catheter was removed after the paracentesis. Bandage placed over the puncture site. Ultrasound confirmed a patent right common femoral vein. Using a combination of fluoroscopy and ultrasound, an access site was determined. A small dermatotomy was made at the planned puncture site. Using ultrasound guidance, access into the right common femoral vein was obtained with visualization of needle entry into the vessel using a standard micropuncture technique. Wire was advanced  into the IVC. An 8 French vascular sheath was placed. Through this access site, an 6 French Accunav ICE catheter was advanced with ease under fluoroscopic guidance to the level of the intrahepatic inferior vena cava. A preliminary ultrasound of the right neck was performed and demonstrates a patent internal jugular vein. A permanent ultrasound image was recorded. Using  a combination of fluoroscopy and ultrasound, an access site was determined. A small dermatotomy was made at the planned puncture site. Using ultrasound guidance, access into the right internal jugular vein was obtained with visualization of needle entry into the vessel using a standard micropuncture technique. A wire was advanced into the IVC and serial fascial dilation performed. A 10 French tips sheath was placed into the internal jugular vein and advanced to the IVC. The jugular sheath was retracted into the right atrium and manometry was performed. A 5 French angled tip catheter was then directed into the middle hepatic vein. Hepatic venogram was performed. These images demonstrated a patent hepatic vein with no stenosis. The catheter was advanced to a wedge portion of the patent vein over which the 10 French sheath was advanced into the middle hepatic vein. Using ICE ultrasound visualization the catheter and middle hepatic vein as well as the portal anatomy was defined. A planned exit site from the hepatic vein and puncture site from the portal vein was placed into a single sonographic plane. Under direct ultrasound visualization, the ScorpionX needle was advanced into the portal venous system near the portal vein bifurcation. A Glidewire Advantage was then advanced into the splenic vein. A 5 French marking pigtail catheter was then advanced over the wire into the main portal vein. It was difficult to remove the wire from the pigtail catheter. It was noted that the [facility] Advantage coating had sheared off and was trapped within the pigtail  catheter. We were unable to advance a microwire or microcatheter through the pigtail catheter. We attempted to snare the wire coating with micro snare devices but this was also unsuccessful. Back of a stiff Glidewire was advanced into the pigtail catheter adjacent to the sheared off wire coating. The pigtail catheter was advanced further into the portal venous system until the wire was near the portal venous system. The stiff wire never exited the pigtail catheter. The pigtail catheter hub was cut. A 6 French Neuron MAX sheath was advanced over the pigtail catheter and through the 10 French sheath. The 6 French sheath was successfully advanced over the pigtail catheter and into the portal venous system using fluoroscopy. Once the 6 French sheath was within the portal venous system, the pigtail catheter was completely removed. Portal venogram was performed through the 6 French vascular sheath. Superstiff Amplatz wire was placed. A new pigtail catheter was placed. Portal manometry was then performed. The tract was then dilated to 8 mm with an 8 mm x 8 cm Athletis balloon. A 8-10 mm by 8 + 2 cm of Viatorr endograft was placed. This was ultimately dilated to 8 mm. After placement of the shunt, right atrial and portal pressures were repeated. Completion portal venogram demonstrates a patent TIPS endograft without significant gastroesophageal varices. The catheters and sheath were removed and manual compression was applied to the right internal jugular and right common femoral venous access sites until hemostasis was achieved. The patient was transferred to the PACU in stable condition. Pre-TIPS Mean Pressures (mmHg): Right atrium: 10 Portal vein: 39 Portosystemic gradient: 29 Post-TIPS Mean Pressures (mmHg): Right atrium:28 Portal vein: 34 Portosystemic gradient: 6 FINDINGS: Main portal vein and TIPS stent were widely patent at the end of the procedure. No significant filling of varices on the final portogram images. 5 L  of ascites was removed. IMPRESSION: 1. Successful transjugular portosystemic shunt creation. 2. Portosystemic gradient of 29 mm Hg (absolute portal venous pressure 39 mm Hg) before shunt placement and 6  mm Hg (absolute portal venous pressure 34 mm Hg) after shunt placement. PLAN: Patient will be admitted for overnight observation. Electronically Signed   By: Juliene Balder M.D.   On: 11/19/2023 16:42   IR US  Guide Vasc Access Right Result Date: 11/19/2023 CLINICAL DATA:  73 year old with cirrhosis and refractory ascites. Patient requires frequent large volume paracentesis. MELD 3.0 = 15. EXAM: 1. Ultrasound-guided paracentesis 2. Ultrasound-guided access of the right internal jugular vein 3. Ultrasound-guided access of the right common femoral vein 4. Hepatic venogram 5. Intravascular ultrasound 6. Catheterization of the portal vein 7. Portal venous and central manometry 8. Portal venogram 9. Creation of a transhepatic portal vein to hepatic vein shunt MEDICATIONS: As antibiotic prophylaxis, Rocephin  2 g was ordered pre-procedure and administered intravenously within one hour of incision. ANESTHESIA/SEDATION: General - as administered by the Anesthesia department CONTRAST:  75 mL Omnipaque  300, intravenous FLUOROSCOPY TIME:  Radiation Exposure Index (as provided by the fluoroscopic device): 220 mGy Kerma COMPLICATIONS: None immediate. PROCEDURE: Informed written consent was obtained from the patient after a thorough discussion of the procedural risks, benefits and alternatives. All questions were addressed. Maximal Sterile Barrier Technique was utilized including caps, mask, sterile gowns, sterile gloves, sterile drape, hand hygiene and skin antiseptic. A timeout was performed prior to the initiation of the procedure. Dr. Wilkie Lent assisted with the procedure. Right abdomen, right groin and right neck were prepped and draped in sterile fashion. Ultrasound demonstrated a large pocket of fluid in the right  abdomen. Ultrasound image was saved for documentation. Skin was anesthetized with 1% lidocaine . Using ultrasound guidance, paracentesis catheter was directed into the ascites. Approximately 5 L of yellow fluid was drained during the procedure. Paracentesis catheter was removed after the paracentesis. Bandage placed over the puncture site. Ultrasound confirmed a patent right common femoral vein. Using a combination of fluoroscopy and ultrasound, an access site was determined. A small dermatotomy was made at the planned puncture site. Using ultrasound guidance, access into the right common femoral vein was obtained with visualization of needle entry into the vessel using a standard micropuncture technique. Wire was advanced into the IVC. An 8 French vascular sheath was placed. Through this access site, an 53 French Accunav ICE catheter was advanced with ease under fluoroscopic guidance to the level of the intrahepatic inferior vena cava. A preliminary ultrasound of the right neck was performed and demonstrates a patent internal jugular vein. A permanent ultrasound image was recorded. Using a combination of fluoroscopy and ultrasound, an access site was determined. A small dermatotomy was made at the planned puncture site. Using ultrasound guidance, access into the right internal jugular vein was obtained with visualization of needle entry into the vessel using a standard micropuncture technique. A wire was advanced into the IVC and serial fascial dilation performed. A 10 French tips sheath was placed into the internal jugular vein and advanced to the IVC. The jugular sheath was retracted into the right atrium and manometry was performed. A 5 French angled tip catheter was then directed into the middle hepatic vein. Hepatic venogram was performed. These images demonstrated a patent hepatic vein with no stenosis. The catheter was advanced to a wedge portion of the patent vein over which the 10 French sheath was advanced  into the middle hepatic vein. Using ICE ultrasound visualization the catheter and middle hepatic vein as well as the portal anatomy was defined. A planned exit site from the hepatic vein and puncture site from the portal vein was placed into a  single sonographic plane. Under direct ultrasound visualization, the ScorpionX needle was advanced into the portal venous system near the portal vein bifurcation. A Glidewire Advantage was then advanced into the splenic vein. A 5 French marking pigtail catheter was then advanced over the wire into the main portal vein. It was difficult to remove the wire from the pigtail catheter. It was noted that the [facility] Advantage coating had sheared off and was trapped within the pigtail catheter. We were unable to advance a microwire or microcatheter through the pigtail catheter. We attempted to snare the wire coating with micro snare devices but this was also unsuccessful. Back of a stiff Glidewire was advanced into the pigtail catheter adjacent to the sheared off wire coating. The pigtail catheter was advanced further into the portal venous system until the wire was near the portal venous system. The stiff wire never exited the pigtail catheter. The pigtail catheter hub was cut. A 6 French Neuron MAX sheath was advanced over the pigtail catheter and through the 10 French sheath. The 6 French sheath was successfully advanced over the pigtail catheter and into the portal venous system using fluoroscopy. Once the 6 French sheath was within the portal venous system, the pigtail catheter was completely removed. Portal venogram was performed through the 6 French vascular sheath. Superstiff Amplatz wire was placed. A new pigtail catheter was placed. Portal manometry was then performed. The tract was then dilated to 8 mm with an 8 mm x 8 cm Athletis balloon. A 8-10 mm by 8 + 2 cm of Viatorr endograft was placed. This was ultimately dilated to 8 mm. After placement of the shunt, right  atrial and portal pressures were repeated. Completion portal venogram demonstrates a patent TIPS endograft without significant gastroesophageal varices. The catheters and sheath were removed and manual compression was applied to the right internal jugular and right common femoral venous access sites until hemostasis was achieved. The patient was transferred to the PACU in stable condition. Pre-TIPS Mean Pressures (mmHg): Right atrium: 10 Portal vein: 39 Portosystemic gradient: 29 Post-TIPS Mean Pressures (mmHg): Right atrium:28 Portal vein: 34 Portosystemic gradient: 6 FINDINGS: Main portal vein and TIPS stent were widely patent at the end of the procedure. No significant filling of varices on the final portogram images. 5 L of ascites was removed. IMPRESSION: 1. Successful transjugular portosystemic shunt creation. 2. Portosystemic gradient of 29 mm Hg (absolute portal venous pressure 39 mm Hg) before shunt placement and 6 mm Hg (absolute portal venous pressure 34 mm Hg) after shunt placement. PLAN: Patient will be admitted for overnight observation. Electronically Signed   By: Juliene Balder M.D.   On: 11/19/2023 16:42   IR US  Guide Vasc Access Right Result Date: 11/19/2023 CLINICAL DATA:  73 year old with cirrhosis and refractory ascites. Patient requires frequent large volume paracentesis. MELD 3.0 = 15. EXAM: 1. Ultrasound-guided paracentesis 2. Ultrasound-guided access of the right internal jugular vein 3. Ultrasound-guided access of the right common femoral vein 4. Hepatic venogram 5. Intravascular ultrasound 6. Catheterization of the portal vein 7. Portal venous and central manometry 8. Portal venogram 9. Creation of a transhepatic portal vein to hepatic vein shunt MEDICATIONS: As antibiotic prophylaxis, Rocephin  2 g was ordered pre-procedure and administered intravenously within one hour of incision. ANESTHESIA/SEDATION: General - as administered by the Anesthesia department CONTRAST:  75 mL Omnipaque  300,  intravenous FLUOROSCOPY TIME:  Radiation Exposure Index (as provided by the fluoroscopic device): 220 mGy Kerma COMPLICATIONS: None immediate. PROCEDURE: Informed written consent was obtained from the  patient after a thorough discussion of the procedural risks, benefits and alternatives. All questions were addressed. Maximal Sterile Barrier Technique was utilized including caps, mask, sterile gowns, sterile gloves, sterile drape, hand hygiene and skin antiseptic. A timeout was performed prior to the initiation of the procedure. Dr. Wilkie Lent assisted with the procedure. Right abdomen, right groin and right neck were prepped and draped in sterile fashion. Ultrasound demonstrated a large pocket of fluid in the right abdomen. Ultrasound image was saved for documentation. Skin was anesthetized with 1% lidocaine . Using ultrasound guidance, paracentesis catheter was directed into the ascites. Approximately 5 L of yellow fluid was drained during the procedure. Paracentesis catheter was removed after the paracentesis. Bandage placed over the puncture site. Ultrasound confirmed a patent right common femoral vein. Using a combination of fluoroscopy and ultrasound, an access site was determined. A small dermatotomy was made at the planned puncture site. Using ultrasound guidance, access into the right common femoral vein was obtained with visualization of needle entry into the vessel using a standard micropuncture technique. Wire was advanced into the IVC. An 8 French vascular sheath was placed. Through this access site, an 57 French Accunav ICE catheter was advanced with ease under fluoroscopic guidance to the level of the intrahepatic inferior vena cava. A preliminary ultrasound of the right neck was performed and demonstrates a patent internal jugular vein. A permanent ultrasound image was recorded. Using a combination of fluoroscopy and ultrasound, an access site was determined. A small dermatotomy was made at the  planned puncture site. Using ultrasound guidance, access into the right internal jugular vein was obtained with visualization of needle entry into the vessel using a standard micropuncture technique. A wire was advanced into the IVC and serial fascial dilation performed. A 10 French tips sheath was placed into the internal jugular vein and advanced to the IVC. The jugular sheath was retracted into the right atrium and manometry was performed. A 5 French angled tip catheter was then directed into the middle hepatic vein. Hepatic venogram was performed. These images demonstrated a patent hepatic vein with no stenosis. The catheter was advanced to a wedge portion of the patent vein over which the 10 French sheath was advanced into the middle hepatic vein. Using ICE ultrasound visualization the catheter and middle hepatic vein as well as the portal anatomy was defined. A planned exit site from the hepatic vein and puncture site from the portal vein was placed into a single sonographic plane. Under direct ultrasound visualization, the ScorpionX needle was advanced into the portal venous system near the portal vein bifurcation. A Glidewire Advantage was then advanced into the splenic vein. A 5 French marking pigtail catheter was then advanced over the wire into the main portal vein. It was difficult to remove the wire from the pigtail catheter. It was noted that the [facility] Advantage coating had sheared off and was trapped within the pigtail catheter. We were unable to advance a microwire or microcatheter through the pigtail catheter. We attempted to snare the wire coating with micro snare devices but this was also unsuccessful. Back of a stiff Glidewire was advanced into the pigtail catheter adjacent to the sheared off wire coating. The pigtail catheter was advanced further into the portal venous system until the wire was near the portal venous system. The stiff wire never exited the pigtail catheter. The pigtail  catheter hub was cut. A 6 French Neuron MAX sheath was advanced over the pigtail catheter and through the 10 French sheath. The  6 French sheath was successfully advanced over the pigtail catheter and into the portal venous system using fluoroscopy. Once the 6 French sheath was within the portal venous system, the pigtail catheter was completely removed. Portal venogram was performed through the 6 French vascular sheath. Superstiff Amplatz wire was placed. A new pigtail catheter was placed. Portal manometry was then performed. The tract was then dilated to 8 mm with an 8 mm x 8 cm Athletis balloon. A 8-10 mm by 8 + 2 cm of Viatorr endograft was placed. This was ultimately dilated to 8 mm. After placement of the shunt, right atrial and portal pressures were repeated. Completion portal venogram demonstrates a patent TIPS endograft without significant gastroesophageal varices. The catheters and sheath were removed and manual compression was applied to the right internal jugular and right common femoral venous access sites until hemostasis was achieved. The patient was transferred to the PACU in stable condition. Pre-TIPS Mean Pressures (mmHg): Right atrium: 10 Portal vein: 39 Portosystemic gradient: 29 Post-TIPS Mean Pressures (mmHg): Right atrium:28 Portal vein: 34 Portosystemic gradient: 6 FINDINGS: Main portal vein and TIPS stent were widely patent at the end of the procedure. No significant filling of varices on the final portogram images. 5 L of ascites was removed. IMPRESSION: 1. Successful transjugular portosystemic shunt creation. 2. Portosystemic gradient of 29 mm Hg (absolute portal venous pressure 39 mm Hg) before shunt placement and 6 mm Hg (absolute portal venous pressure 34 mm Hg) after shunt placement. PLAN: Patient will be admitted for overnight observation. Electronically Signed   By: Juliene Balder M.D.   On: 11/19/2023 16:42   IR Paracentesis Result Date: 11/19/2023 CLINICAL DATA:  73 year old with  cirrhosis and refractory ascites. Patient requires frequent large volume paracentesis. MELD 3.0 = 15. EXAM: 1. Ultrasound-guided paracentesis 2. Ultrasound-guided access of the right internal jugular vein 3. Ultrasound-guided access of the right common femoral vein 4. Hepatic venogram 5. Intravascular ultrasound 6. Catheterization of the portal vein 7. Portal venous and central manometry 8. Portal venogram 9. Creation of a transhepatic portal vein to hepatic vein shunt MEDICATIONS: As antibiotic prophylaxis, Rocephin  2 g was ordered pre-procedure and administered intravenously within one hour of incision. ANESTHESIA/SEDATION: General - as administered by the Anesthesia department CONTRAST:  75 mL Omnipaque  300, intravenous FLUOROSCOPY TIME:  Radiation Exposure Index (as provided by the fluoroscopic device): 220 mGy Kerma COMPLICATIONS: None immediate. PROCEDURE: Informed written consent was obtained from the patient after a thorough discussion of the procedural risks, benefits and alternatives. All questions were addressed. Maximal Sterile Barrier Technique was utilized including caps, mask, sterile gowns, sterile gloves, sterile drape, hand hygiene and skin antiseptic. A timeout was performed prior to the initiation of the procedure. Dr. Wilkie Lent assisted with the procedure. Right abdomen, right groin and right neck were prepped and draped in sterile fashion. Ultrasound demonstrated a large pocket of fluid in the right abdomen. Ultrasound image was saved for documentation. Skin was anesthetized with 1% lidocaine . Using ultrasound guidance, paracentesis catheter was directed into the ascites. Approximately 5 L of yellow fluid was drained during the procedure. Paracentesis catheter was removed after the paracentesis. Bandage placed over the puncture site. Ultrasound confirmed a patent right common femoral vein. Using a combination of fluoroscopy and ultrasound, an access site was determined. A small  dermatotomy was made at the planned puncture site. Using ultrasound guidance, access into the right common femoral vein was obtained with visualization of needle entry into the vessel using a standard micropuncture technique. Wire was  advanced into the IVC. An 8 French vascular sheath was placed. Through this access site, an 37 French Accunav ICE catheter was advanced with ease under fluoroscopic guidance to the level of the intrahepatic inferior vena cava. A preliminary ultrasound of the right neck was performed and demonstrates a patent internal jugular vein. A permanent ultrasound image was recorded. Using a combination of fluoroscopy and ultrasound, an access site was determined. A small dermatotomy was made at the planned puncture site. Using ultrasound guidance, access into the right internal jugular vein was obtained with visualization of needle entry into the vessel using a standard micropuncture technique. A wire was advanced into the IVC and serial fascial dilation performed. A 10 French tips sheath was placed into the internal jugular vein and advanced to the IVC. The jugular sheath was retracted into the right atrium and manometry was performed. A 5 French angled tip catheter was then directed into the middle hepatic vein. Hepatic venogram was performed. These images demonstrated a patent hepatic vein with no stenosis. The catheter was advanced to a wedge portion of the patent vein over which the 10 French sheath was advanced into the middle hepatic vein. Using ICE ultrasound visualization the catheter and middle hepatic vein as well as the portal anatomy was defined. A planned exit site from the hepatic vein and puncture site from the portal vein was placed into a single sonographic plane. Under direct ultrasound visualization, the ScorpionX needle was advanced into the portal venous system near the portal vein bifurcation. A Glidewire Advantage was then advanced into the splenic vein. A 5 French marking  pigtail catheter was then advanced over the wire into the main portal vein. It was difficult to remove the wire from the pigtail catheter. It was noted that the [facility] Advantage coating had sheared off and was trapped within the pigtail catheter. We were unable to advance a microwire or microcatheter through the pigtail catheter. We attempted to snare the wire coating with micro snare devices but this was also unsuccessful. Back of a stiff Glidewire was advanced into the pigtail catheter adjacent to the sheared off wire coating. The pigtail catheter was advanced further into the portal venous system until the wire was near the portal venous system. The stiff wire never exited the pigtail catheter. The pigtail catheter hub was cut. A 6 French Neuron MAX sheath was advanced over the pigtail catheter and through the 10 French sheath. The 6 French sheath was successfully advanced over the pigtail catheter and into the portal venous system using fluoroscopy. Once the 6 French sheath was within the portal venous system, the pigtail catheter was completely removed. Portal venogram was performed through the 6 French vascular sheath. Superstiff Amplatz wire was placed. A new pigtail catheter was placed. Portal manometry was then performed. The tract was then dilated to 8 mm with an 8 mm x 8 cm Athletis balloon. A 8-10 mm by 8 + 2 cm of Viatorr endograft was placed. This was ultimately dilated to 8 mm. After placement of the shunt, right atrial and portal pressures were repeated. Completion portal venogram demonstrates a patent TIPS endograft without significant gastroesophageal varices. The catheters and sheath were removed and manual compression was applied to the right internal jugular and right common femoral venous access sites until hemostasis was achieved. The patient was transferred to the PACU in stable condition. Pre-TIPS Mean Pressures (mmHg): Right atrium: 10 Portal vein: 39 Portosystemic gradient: 29 Post-TIPS  Mean Pressures (mmHg): Right atrium:28 Portal vein: 34 Portosystemic  gradient: 6 FINDINGS: Main portal vein and TIPS stent were widely patent at the end of the procedure. No significant filling of varices on the final portogram images. 5 L of ascites was removed. IMPRESSION: 1. Successful transjugular portosystemic shunt creation. 2. Portosystemic gradient of 29 mm Hg (absolute portal venous pressure 39 mm Hg) before shunt placement and 6 mm Hg (absolute portal venous pressure 34 mm Hg) after shunt placement. PLAN: Patient will be admitted for overnight observation. Electronically Signed   By: Juliene Balder M.D.   On: 11/19/2023 16:42   IR INTRAVASCULAR ULTRASOUND NON CORONARY Result Date: 11/19/2023 CLINICAL DATA:  73 year old with cirrhosis and refractory ascites. Patient requires frequent large volume paracentesis. MELD 3.0 = 15. EXAM: 1. Ultrasound-guided paracentesis 2. Ultrasound-guided access of the right internal jugular vein 3. Ultrasound-guided access of the right common femoral vein 4. Hepatic venogram 5. Intravascular ultrasound 6. Catheterization of the portal vein 7. Portal venous and central manometry 8. Portal venogram 9. Creation of a transhepatic portal vein to hepatic vein shunt MEDICATIONS: As antibiotic prophylaxis, Rocephin  2 g was ordered pre-procedure and administered intravenously within one hour of incision. ANESTHESIA/SEDATION: General - as administered by the Anesthesia department CONTRAST:  75 mL Omnipaque  300, intravenous FLUOROSCOPY TIME:  Radiation Exposure Index (as provided by the fluoroscopic device): 220 mGy Kerma COMPLICATIONS: None immediate. PROCEDURE: Informed written consent was obtained from the patient after a thorough discussion of the procedural risks, benefits and alternatives. All questions were addressed. Maximal Sterile Barrier Technique was utilized including caps, mask, sterile gowns, sterile gloves, sterile drape, hand hygiene and skin antiseptic. A timeout was  performed prior to the initiation of the procedure. Dr. Wilkie Lent assisted with the procedure. Right abdomen, right groin and right neck were prepped and draped in sterile fashion. Ultrasound demonstrated a large pocket of fluid in the right abdomen. Ultrasound image was saved for documentation. Skin was anesthetized with 1% lidocaine . Using ultrasound guidance, paracentesis catheter was directed into the ascites. Approximately 5 L of yellow fluid was drained during the procedure. Paracentesis catheter was removed after the paracentesis. Bandage placed over the puncture site. Ultrasound confirmed a patent right common femoral vein. Using a combination of fluoroscopy and ultrasound, an access site was determined. A small dermatotomy was made at the planned puncture site. Using ultrasound guidance, access into the right common femoral vein was obtained with visualization of needle entry into the vessel using a standard micropuncture technique. Wire was advanced into the IVC. An 8 French vascular sheath was placed. Through this access site, an 30 French Accunav ICE catheter was advanced with ease under fluoroscopic guidance to the level of the intrahepatic inferior vena cava. A preliminary ultrasound of the right neck was performed and demonstrates a patent internal jugular vein. A permanent ultrasound image was recorded. Using a combination of fluoroscopy and ultrasound, an access site was determined. A small dermatotomy was made at the planned puncture site. Using ultrasound guidance, access into the right internal jugular vein was obtained with visualization of needle entry into the vessel using a standard micropuncture technique. A wire was advanced into the IVC and serial fascial dilation performed. A 10 French tips sheath was placed into the internal jugular vein and advanced to the IVC. The jugular sheath was retracted into the right atrium and manometry was performed. A 5 French angled tip catheter was  then directed into the middle hepatic vein. Hepatic venogram was performed. These images demonstrated a patent hepatic vein with no stenosis. The catheter was  advanced to a wedge portion of the patent vein over which the 10 French sheath was advanced into the middle hepatic vein. Using ICE ultrasound visualization the catheter and middle hepatic vein as well as the portal anatomy was defined. A planned exit site from the hepatic vein and puncture site from the portal vein was placed into a single sonographic plane. Under direct ultrasound visualization, the ScorpionX needle was advanced into the portal venous system near the portal vein bifurcation. A Glidewire Advantage was then advanced into the splenic vein. A 5 French marking pigtail catheter was then advanced over the wire into the main portal vein. It was difficult to remove the wire from the pigtail catheter. It was noted that the [facility] Advantage coating had sheared off and was trapped within the pigtail catheter. We were unable to advance a microwire or microcatheter through the pigtail catheter. We attempted to snare the wire coating with micro snare devices but this was also unsuccessful. Back of a stiff Glidewire was advanced into the pigtail catheter adjacent to the sheared off wire coating. The pigtail catheter was advanced further into the portal venous system until the wire was near the portal venous system. The stiff wire never exited the pigtail catheter. The pigtail catheter hub was cut. A 6 French Neuron MAX sheath was advanced over the pigtail catheter and through the 10 French sheath. The 6 French sheath was successfully advanced over the pigtail catheter and into the portal venous system using fluoroscopy. Once the 6 French sheath was within the portal venous system, the pigtail catheter was completely removed. Portal venogram was performed through the 6 French vascular sheath. Superstiff Amplatz wire was placed. A new pigtail catheter was  placed. Portal manometry was then performed. The tract was then dilated to 8 mm with an 8 mm x 8 cm Athletis balloon. A 8-10 mm by 8 + 2 cm of Viatorr endograft was placed. This was ultimately dilated to 8 mm. After placement of the shunt, right atrial and portal pressures were repeated. Completion portal venogram demonstrates a patent TIPS endograft without significant gastroesophageal varices. The catheters and sheath were removed and manual compression was applied to the right internal jugular and right common femoral venous access sites until hemostasis was achieved. The patient was transferred to the PACU in stable condition. Pre-TIPS Mean Pressures (mmHg): Right atrium: 10 Portal vein: 39 Portosystemic gradient: 29 Post-TIPS Mean Pressures (mmHg): Right atrium:28 Portal vein: 34 Portosystemic gradient: 6 FINDINGS: Main portal vein and TIPS stent were widely patent at the end of the procedure. No significant filling of varices on the final portogram images. 5 L of ascites was removed. IMPRESSION: 1. Successful transjugular portosystemic shunt creation. 2. Portosystemic gradient of 29 mm Hg (absolute portal venous pressure 39 mm Hg) before shunt placement and 6 mm Hg (absolute portal venous pressure 34 mm Hg) after shunt placement. PLAN: Patient will be admitted for overnight observation. Electronically Signed   By: Juliene Balder M.D.   On: 11/19/2023 16:42    Labs:  CBC: Recent Labs    11/19/23 0630 11/19/23 0857 11/19/23 1033 11/20/23 0550 11/21/23 0418 11/22/23 0528  WBC 3.2*  --   --  3.7* 5.2 3.7*  HGB 10.8*   < > 7.8* 8.5* 9.4* 9.0*  HCT 33.1*   < > 23.0* 26.2* 28.5* 26.8*  PLT 73*  --   --  60* 59* 64*   < > = values in this interval not displayed.    COAGS: Recent Labs  11/19/23 0630 11/20/23 0550 11/21/23 0418 11/22/23 0528  INR 1.2 1.4* 1.5* 1.4*    BMP: Recent Labs    11/19/23 0630 11/19/23 0857 11/19/23 1033 11/20/23 0550 11/21/23 0418 11/22/23 0528  NA 140   <  > 145 136 139 140  K 4.0   < > 3.9 4.2 3.5 3.3*  CL 107  --   --  108 109 110  CO2 22  --   --  16* 20* 21*  GLUCOSE 149*  --   --  194* 196* 155*  BUN 11  --   --  15 16 16   CALCIUM  8.8*  --   --  8.4* 8.9 8.5*  CREATININE 1.31*  --   --  1.41* 1.61* 1.11*  GFRNONAA 43*  --   --  40* 34* 53*   < > = values in this interval not displayed.    LIVER FUNCTION TESTS: Recent Labs    11/19/23 0630 11/20/23 0550 11/21/23 0418 11/22/23 0528  BILITOT 1.9* 1.3* 1.5* 1.9*  AST 24 31 58* 53*  ALT 14 11 27  36  ALKPHOS 112 63 87 82  PROT 6.6 5.4* 5.6* 5.2*  ALBUMIN  3.5 3.4* 3.5 3.0*    Assessment and Plan:  Decompensated Cirrhosis Post TIPs 11/7 Admitted now to [facility] Will follow  Electronically Signed: Sharlet DELENA Candle, PA-C 11/22/2023, 2:46 PM   I spent a total of 15 Minutes at the the patient's bedside AND on the patient's hospital floor or unit, greater than 50% of which was counseling/coordinating care for post TIPs

## 2023-11-23 DIAGNOSIS — Z95828 Presence of other vascular implants and grafts: Secondary | ICD-10-CM | POA: Diagnosis not present

## 2023-11-23 LAB — BASIC METABOLIC PANEL WITH GFR
Anion gap: 8 (ref 5–15)
BUN: 15 mg/dL (ref 8–23)
CO2: 20 mmol/L — ABNORMAL LOW (ref 22–32)
Calcium: 8.5 mg/dL — ABNORMAL LOW (ref 8.9–10.3)
Chloride: 112 mmol/L — ABNORMAL HIGH (ref 98–111)
Creatinine, Ser: 1.13 mg/dL — ABNORMAL HIGH (ref 0.44–1.00)
GFR, Estimated: 52 mL/min — ABNORMAL LOW (ref >60)
Glucose, Bld: 237 mg/dL — ABNORMAL HIGH (ref 70–99)
Potassium: 4.3 mmol/L (ref 3.5–5.1)
Sodium: 140 mmol/L (ref 135–145)

## 2023-11-23 MED ORDER — LACTULOSE 10 GM/15ML PO SOLN: 30.0000 g | Freq: Two times a day (BID) | ORAL | Status: DC | PRN

## 2023-11-23 NOTE — Evaluation (Signed)
 Occupational Therapy Evaluation Patient Details Name: Andrea Dickson MRN: [id number] DOB: February 10, 1950 Today's Date: 11/23/2023   History of Present Illness   73 yo female s/p tips procedure 11/7. Also in HF. X2 recent paracenteses, 11/17/23 (5L), 11/03/23 (5L). PMH significant for decompensated cirrhosis (presumably MASH), DM, GERD, HLD, Bipolar 1.     Clinical Impressions PTA, pt lived alone with family frequently checking on her, and son performing her grocery shopping. Upon eval, pt with decreased endurance, balance, and strength. Pt needing up to CGA for OOB mobility and BADL. Pt reports good family support at home and very eager to return home. Recommending continued OT services via [facility] at discharge.      If plan is discharge home, recommend the following:   A little help with walking and/or transfers;A little help with bathing/dressing/bathroom;Assistance with cooking/housework;Assist for transportation;Help with stairs or ramp for entrance     Functional Status Assessment   Patient has had a recent decline in their functional status and demonstrates the ability to make significant improvements in function in a reasonable and predictable amount of time.     Equipment Recommendations   BSC/3in1     Recommendations for Other Services         Precautions/Restrictions   Precautions Precautions: Fall Restrictions Weight Bearing Restrictions Per Provider Order: No     Mobility Bed Mobility               General bed mobility comments: OOB in chair    Transfers Overall transfer level: Needs assistance Equipment used: Rolling walker (2 wheels) Transfers: Sit to/from Stand, Bed to chair/wheelchair/BSC Sit to Stand: Contact guard assist, Supervision           General transfer comment: for safety, no hands on assist needed      Balance Overall balance assessment: Needs assistance Sitting-balance support: No upper extremity supported, Feet  supported Sitting balance-Leahy Scale: Fair     Standing balance support: Bilateral upper extremity supported, During functional activity, Reliant on assistive device for balance                               ADL either performed or assessed with clinical judgement   ADL Overall ADL's : Needs assistance/impaired Eating/Feeding: Modified independent;Sitting   Grooming: Contact guard assist;Standing   Upper Body Bathing: Set up;Sitting   Lower Body Bathing: Contact guard assist;Sit to/from stand   Upper Body Dressing : Set up;Sitting   Lower Body Dressing: Contact guard assist;Sit to/from stand   Toilet Transfer: Contact guard assist;Ambulation;Rolling walker (2 wheels);BSC/3in1           Functional mobility during ADLs: Contact guard assist;Rolling walker (2 wheels);Supervision/safety General ADL Comments: approaching supervision     Vision Baseline Vision/History: 1 Wears glasses Patient Visual Report: No change from baseline Vision Assessment?: No apparent visual deficits     Perception Perception: Not tested       Praxis Praxis: Not tested       Pertinent Vitals/Pain Pain Assessment Pain Assessment: Faces Faces Pain Scale: Hurts little more Pain Location: buttocks (frequent BMs) Pain Descriptors / Indicators: Discomfort, Sore (raw) Pain Intervention(s): Limited activity within patient's tolerance, Monitored during session     Extremity/Trunk Assessment Upper Extremity Assessment Upper Extremity Assessment: Generalized weakness   Lower Extremity Assessment Lower Extremity Assessment: Defer to PT evaluation   Cervical / Trunk Assessment Cervical / Trunk Assessment: Kyphotic;Other exceptions Cervical / Trunk Exceptions: abdominal distention, related  to MATTEL   Communication Communication Communication: No apparent difficulties   Cognition Arousal: Alert Behavior During Therapy: WFL for tasks assessed/performed Cognition: No apparent  impairments                               Following commands: Intact       Cueing  General Comments   Cueing Techniques: Verbal cues;Gestural cues  pt on RA throughout   Exercises     Shoulder Instructions      Home Living Family/patient expects to be discharged to:: Private residence Living Arrangements: Alone Available Help at Discharge: Family;Friend(s);Available PRN/intermittently (son, sisters, aunt, grand daughter) Type of Home: House Home Access: Level entry     Home Layout: One level     Bathroom Shower/Tub: Chief Strategy Officer: Standard     Home Equipment: Information systems manager;Wheelchair - manual;Toilet riser;Grab bars - tub/shower;Standard Environmental Consultant          Prior Functioning/Environment Prior Level of Function : Independent/Modified Independent             Mobility Comments: using walker all the time PTA ADLs Comments: indep; son picks up groceries for her, pt uses compensatory techniques for cooking ( microwave meals)    OT Problem List: Decreased strength;Decreased activity tolerance;Impaired balance (sitting and/or standing);Decreased knowledge of precautions;Decreased knowledge of use of DME or AE   OT Treatment/Interventions: Self-care/ADL training;Therapeutic exercise;DME and/or AE instruction;Therapeutic activities;Patient/family education;Balance training      OT Goals(Current goals can be found in the care plan section)   Acute Rehab OT Goals Patient Stated Goal: get better OT Goal Formulation: With patient Time For Goal Achievement: 12/07/23 Potential to Achieve Goals: Good   OT Frequency:  Min 2X/week    Co-evaluation              AM-PAC OT 6 Clicks Daily Activity     Outcome Measure Help from another person eating meals?: None Help from another person taking care of personal grooming?: A Little Help from another person toileting, which includes using toliet, bedpan, or urinal?: A Little Help from  another person bathing (including washing, rinsing, drying)?: A Little Help from another person to put on and taking off regular upper body clothing?: A Little Help from another person to put on and taking off regular lower body clothing?: A Little 6 Click Score: 19   End of Session Equipment Utilized During Treatment: Rolling walker (2 wheels) Nurse Communication: Mobility status  Activity Tolerance: Patient tolerated treatment well Patient left: in chair;with call bell/phone within reach;with chair alarm set  OT Visit Diagnosis: Unsteadiness on feet (R26.81);Muscle weakness (generalized) (M62.81);Other (comment) (decreased activity tolerance.)                Time: 8977-8954 OT Time Calculation (min): 23 min Charges:  OT General Charges $OT Visit: 1 Visit OT Evaluation $OT Eval Low Complexity: 1 Low OT Treatments $Self Care/Home Management : 8-22 mins  Elma JONETTA Lebron FREDERICK, OTR/L [facility] Acute Rehabilitation Office: [phone]   Elma JONETTA Lebron 11/23/2023, 12:47 PM

## 2023-11-23 NOTE — Discharge Summary (Signed)
 Physician Discharge Summary  Andrea Dickson FMW:[id number] DOB: 29-Jan-1950 DOA: 11/19/2023  PCP: Lari Elspeth BRAVO, MD  Admit date: 11/19/2023 Discharge date: 11/23/2023  Admitted From: Home Disposition: Home with home health  Recommendations for Outpatient Follow-up:  Follow up with PCP in 1-2 weeks Please obtain CMP/CBC in one week IR to schedule follow-up  Home Health: Home health PT Equipment/Devices: None needed  Discharge Condition: Stable CODE STATUS: Full code Diet recommendation: Low-salt diet  Discharge summary: 73 year old with history of [place] cirrhosis, obesity, diabetes, hypertension hyperlipidemia GERD, chronic diastolic dysfunction, bipolar disorder who was admitted for TIPS procedure by radiology.  Underwent TIPS on 11/7 in the setting of decompensated cirrhosis with ascites and was overnight monitored. Patient was noted to be having worsening shortness of breath with exertion, concern for volume overload so she was admitted to the hospital 11/9. Chest x-ray showed trace left pleural effusion.  Started on IV diuresis.  Cardiology also consulted.  Fluid overload, multifactorial but mostly related to decompensated cirrhosis.  Patient has grade 1 diastolic dysfunction with normal ejection fraction. Probably aggravated by TIPS procedure, increasing preload. Patient was treated with IV Lasix , spironolactone .  Repeat echocardiogram is stable.  Renal function is stable.   Clinically stabilizing.  Going home on Lasix  40 mg daily, spironolactone  100 mg daily.  Outpatient follow-up.   Hypokalemia and hypomagnesemia: Placed aggressively.  Adequate today.   Acute kidney injury: Secondary to #1.  Improved and normalized.   NASH cirrhosis status post TIPS: Symptomatic management, spironolactone , propranolol , lactulose, PPI.  Paracentesis 11/7.  Having a lot of diarrhea, will change lactulose to as needed to make 2-3 bowel movements a day.   Bipolar disorder: On bupropion ,  Remeron  and diazepam .  Resume on discharge.  Mobilized around.  Did not need any supplemental oxygen.  Feels comfortable with plan to go home with family support.   Discharge Diagnoses:  Principal Problem:   S/P TIPS (transjugular intrahepatic portosystemic shunt) Active Problems:   Bipolar I disorder, most recent episode (or current) manic (HCC)   Hepatic cirrhosis (HCC)   Asthma, chronic   Morbid obesity (HCC)   Esophageal reflux   Mixed hyperlipidemia   Acute on chronic diastolic CHF (congestive heart failure) (HCC)   AKI (acute kidney injury)    Discharge Instructions  Discharge Instructions     Diet - low sodium heart healthy   Complete by: As directed    Increase activity slowly   Complete by: As directed    No wound care   Complete by: As directed       Allergies as of 11/23/2023       Reactions   Pramipexole Other (See Comments)   Shaking, palpitations, headache, faint feeling   Crestor [rosuvastatin Calcium ]    Muscle pain   Bee Pollen Other (See Comments)   Eyes and nose run   Pollen Extract Other (See Comments)   Eyes and nose run        Medication List     TAKE these medications    acetaminophen  500 MG tablet Commonly known as: TYLENOL  Take 1,000 mg by mouth 2 (two) times daily as needed for moderate pain or headache.   buPROPion  150 MG 24 hr tablet Commonly known as: Wellbutrin  XL Take 1 tablet (150 mg total) by mouth every morning.   cetirizine 10 MG tablet Commonly known as: ZYRTEC Take 10 mg by mouth daily. Aller-Tec   Culturelle Digestive Health Caps Take 1 capsule by mouth daily.   diazepam  2 MG  tablet Commonly known as: Valium  Take 1 tablet (2 mg total) by mouth daily as needed for anxiety.   estrogens  (conjugated) 0.625 MG tablet Commonly known as: PREMARIN  Take 0.625 mg by mouth daily.   ezetimibe 10 MG tablet Commonly known as: ZETIA Take 10 mg by mouth at bedtime.   fluticasone 50 MCG/ACT nasal spray Commonly  known as: FLONASE Place 2 sprays into both nostrils daily.   furosemide  40 MG tablet Commonly known as: Lasix  Take 1 tablet (40 mg total) by mouth daily.   hyoscyamine  0.125 MG SL tablet Commonly known as: LEVSIN  SL Place 1 tablet (0.125 mg total) under the tongue every 6 (six) hours as needed for cramping.   lactulose 10 GM/15ML solution Commonly known as: CHRONULAC Take 45 mLs (30 g total) by mouth 2 (two) times daily as needed for mild constipation (to make 2-3 bowel movements a day).   mirtazapine  15 MG tablet Commonly known as: REMERON  Take 1 tablet (15 mg total) by mouth at bedtime.   omeprazole  40 MG capsule Commonly known as: PRILOSEC TAKE ONE (1) CAPSULE BY MOUTH TWICE DAILY AT BREAKFAST AND AT BEDTIME   ondansetron  4 MG tablet Commonly known as: ZOFRAN  TAKE 1 TABLET BY MOUTH EVERY 8 HOURS AS NEEDED FOR NAUSEA & VOMITING   propranolol  ER 80 MG 24 hr capsule Commonly known as: Inderal  LA Take 1 capsule (80 mg total) by mouth daily.   spironolactone  100 MG tablet Commonly known as: Aldactone  Take 1 tablet (100 mg total) by mouth daily.   trimethoprim  100 MG tablet Commonly known as: TRIMPEX  TAKE 1 TABLET BY MOUTH EVERY DAY AT BEDTIME   Vitamin D3 50 MCG (2000 UT) Tabs Take 2,000 Units by mouth daily.   Zenpep  [id number] units Cpep Generic drug: Pancrelipase  (Lip-Prot-Amyl) Take 1 capsule by mouth 3 (three) times daily with meals.        Follow-up Information     [facility]. Schedule an appointment as soon as possible for a visit in 1 month(s).   Specialty: Radiology Why: Radiology scheduler will contact you to schedule a follow-up appointment in 1 month with Dr. Philip in clinic. Contact information: [address] [place]   [address] [phone]               Allergies  Allergen Reactions   Pramipexole Other (See Comments)    Shaking, palpitations, headache, faint feeling   Crestor [Rosuvastatin Calcium ]      Muscle pain   Bee Pollen Other (See Comments)    Eyes and nose run   Pollen Extract Other (See Comments)    Eyes and nose run    Consultations: IR Cardiology   Procedures/Studies: ECHOCARDIOGRAM COMPLETE Result Date: 11/21/2023    ECHOCARDIOGRAM REPORT   Patient Name:   Andrea Dickson Date of Exam: 11/21/2023 Medical Rec #:  [id number]          Height:       66.0 in Accession #:    [id number]         Weight:       155.0 lb Date of Birth:  03/04/1950          BSA:          1.794 m Patient Age:    73 years           BP:           115/51 mmHg Patient Gender: F  HR:           83 bpm. Exam Location:  Inpatient Procedure: 2D Echo, Color Doppler and Cardiac Doppler (Both Spectral and Color            Flow Doppler were utilized during procedure). Indications:    Dyspnea R06.00  History:        Patient has prior history of Echocardiogram examinations, most                 recent 08/31/2023. Risk Factors:Dyslipidemia and Diabetes.  Sonographer:    Tinnie Gosling RDCS Referring Phys: [id number] DORN FALCON BRANCH IMPRESSIONS  1. Left ventricular ejection fraction, by estimation, is 70 to 75%. The left ventricle has hyperdynamic function. The left ventricle has no regional wall motion abnormalities. Left ventricular diastolic parameters are indeterminate.  2. Right ventricular systolic function is normal. The right ventricular size is normal. Tricuspid regurgitation signal is inadequate for assessing PA pressure.  3. The mitral valve is normal in structure. No evidence of mitral valve regurgitation. No evidence of mitral stenosis.  4. The tricuspid valve is abnormal.  5. The aortic valve is tricuspid. Aortic valve regurgitation is not visualized. No aortic stenosis is present.  6. The inferior vena cava is dilated in size with <50% respiratory variability, suggesting right atrial pressure of 15 mmHg. FINDINGS  Left Ventricle: Hyperdynamic LV function creates mild intracavitary gradient, peak 17 mmHg.  Left ventricular ejection fraction, by estimation, is 70 to 75%. The left ventricle has hyperdynamic function. The left ventricle has no regional wall motion abnormalities. The left ventricular internal cavity size was normal in size. There is no left ventricular hypertrophy. Left ventricular diastolic parameters are indeterminate. Right Ventricle: The right ventricular size is normal. Right vetricular wall thickness was not well visualized. Right ventricular systolic function is normal. Tricuspid regurgitation signal is inadequate for assessing PA pressure. Left Atrium: Left atrial size was normal in size. Right Atrium: Right atrial size was normal in size. Pericardium: There is no evidence of pericardial effusion. Mitral Valve: The mitral valve is normal in structure. No evidence of mitral valve regurgitation. No evidence of mitral valve stenosis. Tricuspid Valve: The tricuspid valve is abnormal. Tricuspid valve regurgitation is mild . No evidence of tricuspid stenosis. Aortic Valve: The aortic valve is tricuspid. Aortic valve regurgitation is not visualized. No aortic stenosis is present. Aortic valve mean gradient measures 6.1 mmHg. Aortic valve peak gradient measures 13.7 mmHg. Aortic valve area, by VTI measures 2.74  cm. Pulmonic Valve: The pulmonic valve was not well visualized. Pulmonic valve regurgitation is not visualized. No evidence of pulmonic stenosis. Aorta: The aortic root and ascending aorta are structurally normal, with no evidence of dilitation. Venous: The inferior vena cava is dilated in size with less than 50% respiratory variability, suggesting right atrial pressure of 15 mmHg. IAS/Shunts: No atrial level shunt detected by color flow Doppler.  LEFT VENTRICLE PLAX 2D LVIDd:         3.90 cm   Diastology LVIDs:         2.30 cm   LV e' medial:    7.72 cm/s LV PW:         1.10 cm   LV E/e' medial:  14.8 LV IVS:        0.90 cm   LV e' lateral:   12.40 cm/s LVOT diam:     1.80 cm   LV E/e' lateral:  9.2 LV SV:         73 LV  SV Index:   40 LVOT Area:     2.54 cm LV IVRT:       85 msec  RIGHT VENTRICLE             IVC RV S prime:     13.60 cm/s  IVC diam: 2.60 cm TAPSE (M-mode): 2.2 cm                             PULMONARY VEINS                             Diastolic Velocity: 86.20 cm/s                             S/D Velocity:       0.90                             Systolic Velocity:  80.70 cm/s LEFT ATRIUM             Index        RIGHT ATRIUM           Index LA diam:        3.80 cm 2.12 cm/m   RA Area:     10.20 cm LA Vol (A2C):   71.7 ml 39.96 ml/m  RA Volume:   20.10 ml  11.20 ml/m LA Vol (A4C):   45.9 ml 25.58 ml/m LA Biplane Vol: 57.6 ml 32.10 ml/m  AORTIC VALVE AV Area (Vmax):    1.85 cm AV Area (Vmean):   2.22 cm AV Area (VTI):     2.74 cm AV Vmax:           185.37 cm/s AV Vmean:          111.443 cm/s AV VTI:            0.265 m AV Peak Grad:      13.7 mmHg AV Mean Grad:      6.1 mmHg LVOT Vmax:         135.00 cm/s LVOT Vmean:        97.300 cm/s LVOT VTI:          0.285 m LVOT/AV VTI ratio: 1.08  AORTA Ao Root diam: 2.60 cm Ao Asc diam:  2.70 cm MITRAL VALVE MV Area (PHT): 3.89 cm     SHUNTS MV Decel Time: 195 msec     Systemic VTI:  0.29 m MV E velocity: 114.00 cm/s  Systemic Diam: 1.80 cm MV A velocity: 93.80 cm/s MV E/A ratio:  1.22 Dorn Ross MD Electronically signed by Dorn Ross MD Signature Date/Time: 11/21/2023/4:21:06 PM    Final    DG Chest Port 1 View Result Date: 11/21/2023 CLINICAL DATA:  10026 Shortness of breath 10026 EXAM: PORTABLE CHEST 1 VIEW COMPARISON:  June 28, 2023 FINDINGS: The cardiomediastinal silhouette is unchanged in contour. Trace LEFT pleural effusion. No pneumothorax. No acute pleuroparenchymal abnormality. IMPRESSION: Trace LEFT pleural effusion. Electronically Signed   By: Corean Salter M.D.   On: 11/21/2023 10:06   IR Tips Result Date: 11/19/2023 CLINICAL DATA:  73 year old with cirrhosis and refractory ascites. Patient requires frequent  large volume paracentesis. MELD 3.0 = 15. EXAM: 1. Ultrasound-guided paracentesis 2. Ultrasound-guided access of the right internal jugular vein 3. Ultrasound-guided access of the right common  femoral vein 4. Hepatic venogram 5. Intravascular ultrasound 6. Catheterization of the portal vein 7. Portal venous and central manometry 8. Portal venogram 9. Creation of a transhepatic portal vein to hepatic vein shunt MEDICATIONS: As antibiotic prophylaxis, Rocephin  2 g was ordered pre-procedure and administered intravenously within one hour of incision. ANESTHESIA/SEDATION: General - as administered by the Anesthesia department CONTRAST:  75 mL Omnipaque  300, intravenous FLUOROSCOPY TIME:  Radiation Exposure Index (as provided by the fluoroscopic device): 220 mGy Kerma COMPLICATIONS: None immediate. PROCEDURE: Informed written consent was obtained from the patient after a thorough discussion of the procedural risks, benefits and alternatives. All questions were addressed. Maximal Sterile Barrier Technique was utilized including caps, mask, sterile gowns, sterile gloves, sterile drape, hand hygiene and skin antiseptic. A timeout was performed prior to the initiation of the procedure. Dr. Wilkie Lent assisted with the procedure. Right abdomen, right groin and right neck were prepped and draped in sterile fashion. Ultrasound demonstrated a large pocket of fluid in the right abdomen. Ultrasound image was saved for documentation. Skin was anesthetized with 1% lidocaine . Using ultrasound guidance, paracentesis catheter was directed into the ascites. Approximately 5 L of yellow fluid was drained during the procedure. Paracentesis catheter was removed after the paracentesis. Bandage placed over the puncture site. Ultrasound confirmed a patent right common femoral vein. Using a combination of fluoroscopy and ultrasound, an access site was determined. A small dermatotomy was made at the planned puncture site. Using ultrasound  guidance, access into the right common femoral vein was obtained with visualization of needle entry into the vessel using a standard micropuncture technique. Wire was advanced into the IVC. An 8 French vascular sheath was placed. Through this access site, an 30 French Accunav ICE catheter was advanced with ease under fluoroscopic guidance to the level of the intrahepatic inferior vena cava. A preliminary ultrasound of the right neck was performed and demonstrates a patent internal jugular vein. A permanent ultrasound image was recorded. Using a combination of fluoroscopy and ultrasound, an access site was determined. A small dermatotomy was made at the planned puncture site. Using ultrasound guidance, access into the right internal jugular vein was obtained with visualization of needle entry into the vessel using a standard micropuncture technique. A wire was advanced into the IVC and serial fascial dilation performed. A 10 French tips sheath was placed into the internal jugular vein and advanced to the IVC. The jugular sheath was retracted into the right atrium and manometry was performed. A 5 French angled tip catheter was then directed into the middle hepatic vein. Hepatic venogram was performed. These images demonstrated a patent hepatic vein with no stenosis. The catheter was advanced to a wedge portion of the patent vein over which the 10 French sheath was advanced into the middle hepatic vein. Using ICE ultrasound visualization the catheter and middle hepatic vein as well as the portal anatomy was defined. A planned exit site from the hepatic vein and puncture site from the portal vein was placed into a single sonographic plane. Under direct ultrasound visualization, the ScorpionX needle was advanced into the portal venous system near the portal vein bifurcation. A Glidewire Advantage was then advanced into the splenic vein. A 5 French marking pigtail catheter was then advanced over the wire into the main  portal vein. It was difficult to remove the wire from the pigtail catheter. It was noted that the [facility] Advantage coating had sheared off and was trapped within the pigtail catheter. We were unable to advance a  microwire or microcatheter through the pigtail catheter. We attempted to snare the wire coating with micro snare devices but this was also unsuccessful. Back of a stiff Glidewire was advanced into the pigtail catheter adjacent to the sheared off wire coating. The pigtail catheter was advanced further into the portal venous system until the wire was near the portal venous system. The stiff wire never exited the pigtail catheter. The pigtail catheter hub was cut. A 6 French Neuron MAX sheath was advanced over the pigtail catheter and through the 10 French sheath. The 6 French sheath was successfully advanced over the pigtail catheter and into the portal venous system using fluoroscopy. Once the 6 French sheath was within the portal venous system, the pigtail catheter was completely removed. Portal venogram was performed through the 6 French vascular sheath. Superstiff Amplatz wire was placed. A new pigtail catheter was placed. Portal manometry was then performed. The tract was then dilated to 8 mm with an 8 mm x 8 cm Athletis balloon. A 8-10 mm by 8 + 2 cm of Viatorr endograft was placed. This was ultimately dilated to 8 mm. After placement of the shunt, right atrial and portal pressures were repeated. Completion portal venogram demonstrates a patent TIPS endograft without significant gastroesophageal varices. The catheters and sheath were removed and manual compression was applied to the right internal jugular and right common femoral venous access sites until hemostasis was achieved. The patient was transferred to the PACU in stable condition. Pre-TIPS Mean Pressures (mmHg): Right atrium: 10 Portal vein: 39 Portosystemic gradient: 29 Post-TIPS Mean Pressures (mmHg): Right atrium:28 Portal vein: 34  Portosystemic gradient: 6 FINDINGS: Main portal vein and TIPS stent were widely patent at the end of the procedure. No significant filling of varices on the final portogram images. 5 L of ascites was removed. IMPRESSION: 1. Successful transjugular portosystemic shunt creation. 2. Portosystemic gradient of 29 mm Hg (absolute portal venous pressure 39 mm Hg) before shunt placement and 6 mm Hg (absolute portal venous pressure 34 mm Hg) after shunt placement. PLAN: Patient will be admitted for overnight observation. Electronically Signed   By: Juliene Balder M.D.   On: 11/19/2023 16:42   IR US  Guide Vasc Access Right Result Date: 11/19/2023 CLINICAL DATA:  73 year old with cirrhosis and refractory ascites. Patient requires frequent large volume paracentesis. MELD 3.0 = 15. EXAM: 1. Ultrasound-guided paracentesis 2. Ultrasound-guided access of the right internal jugular vein 3. Ultrasound-guided access of the right common femoral vein 4. Hepatic venogram 5. Intravascular ultrasound 6. Catheterization of the portal vein 7. Portal venous and central manometry 8. Portal venogram 9. Creation of a transhepatic portal vein to hepatic vein shunt MEDICATIONS: As antibiotic prophylaxis, Rocephin  2 g was ordered pre-procedure and administered intravenously within one hour of incision. ANESTHESIA/SEDATION: General - as administered by the Anesthesia department CONTRAST:  75 mL Omnipaque  300, intravenous FLUOROSCOPY TIME:  Radiation Exposure Index (as provided by the fluoroscopic device): 220 mGy Kerma COMPLICATIONS: None immediate. PROCEDURE: Informed written consent was obtained from the patient after a thorough discussion of the procedural risks, benefits and alternatives. All questions were addressed. Maximal Sterile Barrier Technique was utilized including caps, mask, sterile gowns, sterile gloves, sterile drape, hand hygiene and skin antiseptic. A timeout was performed prior to the initiation of the procedure. Dr. Wilkie Lent assisted with the procedure. Right abdomen, right groin and right neck were prepped and draped in sterile fashion. Ultrasound demonstrated a large pocket of fluid in the right abdomen. Ultrasound image was saved for  documentation. Skin was anesthetized with 1% lidocaine . Using ultrasound guidance, paracentesis catheter was directed into the ascites. Approximately 5 L of yellow fluid was drained during the procedure. Paracentesis catheter was removed after the paracentesis. Bandage placed over the puncture site. Ultrasound confirmed a patent right common femoral vein. Using a combination of fluoroscopy and ultrasound, an access site was determined. A small dermatotomy was made at the planned puncture site. Using ultrasound guidance, access into the right common femoral vein was obtained with visualization of needle entry into the vessel using a standard micropuncture technique. Wire was advanced into the IVC. An 8 French vascular sheath was placed. Through this access site, an 14 French Accunav ICE catheter was advanced with ease under fluoroscopic guidance to the level of the intrahepatic inferior vena cava. A preliminary ultrasound of the right neck was performed and demonstrates a patent internal jugular vein. A permanent ultrasound image was recorded. Using a combination of fluoroscopy and ultrasound, an access site was determined. A small dermatotomy was made at the planned puncture site. Using ultrasound guidance, access into the right internal jugular vein was obtained with visualization of needle entry into the vessel using a standard micropuncture technique. A wire was advanced into the IVC and serial fascial dilation performed. A 10 French tips sheath was placed into the internal jugular vein and advanced to the IVC. The jugular sheath was retracted into the right atrium and manometry was performed. A 5 French angled tip catheter was then directed into the middle hepatic vein. Hepatic venogram was  performed. These images demonstrated a patent hepatic vein with no stenosis. The catheter was advanced to a wedge portion of the patent vein over which the 10 French sheath was advanced into the middle hepatic vein. Using ICE ultrasound visualization the catheter and middle hepatic vein as well as the portal anatomy was defined. A planned exit site from the hepatic vein and puncture site from the portal vein was placed into a single sonographic plane. Under direct ultrasound visualization, the ScorpionX needle was advanced into the portal venous system near the portal vein bifurcation. A Glidewire Advantage was then advanced into the splenic vein. A 5 French marking pigtail catheter was then advanced over the wire into the main portal vein. It was difficult to remove the wire from the pigtail catheter. It was noted that the [facility] Advantage coating had sheared off and was trapped within the pigtail catheter. We were unable to advance a microwire or microcatheter through the pigtail catheter. We attempted to snare the wire coating with micro snare devices but this was also unsuccessful. Back of a stiff Glidewire was advanced into the pigtail catheter adjacent to the sheared off wire coating. The pigtail catheter was advanced further into the portal venous system until the wire was near the portal venous system. The stiff wire never exited the pigtail catheter. The pigtail catheter hub was cut. A 6 French Neuron MAX sheath was advanced over the pigtail catheter and through the 10 French sheath. The 6 French sheath was successfully advanced over the pigtail catheter and into the portal venous system using fluoroscopy. Once the 6 French sheath was within the portal venous system, the pigtail catheter was completely removed. Portal venogram was performed through the 6 French vascular sheath. Superstiff Amplatz wire was placed. A new pigtail catheter was placed. Portal manometry was then performed. The tract was then  dilated to 8 mm with an 8 mm x 8 cm Athletis balloon. A 8-10 mm by 8 +  2 cm of Viatorr endograft was placed. This was ultimately dilated to 8 mm. After placement of the shunt, right atrial and portal pressures were repeated. Completion portal venogram demonstrates a patent TIPS endograft without significant gastroesophageal varices. The catheters and sheath were removed and manual compression was applied to the right internal jugular and right common femoral venous access sites until hemostasis was achieved. The patient was transferred to the PACU in stable condition. Pre-TIPS Mean Pressures (mmHg): Right atrium: 10 Portal vein: 39 Portosystemic gradient: 29 Post-TIPS Mean Pressures (mmHg): Right atrium:28 Portal vein: 34 Portosystemic gradient: 6 FINDINGS: Main portal vein and TIPS stent were widely patent at the end of the procedure. No significant filling of varices on the final portogram images. 5 L of ascites was removed. IMPRESSION: 1. Successful transjugular portosystemic shunt creation. 2. Portosystemic gradient of 29 mm Hg (absolute portal venous pressure 39 mm Hg) before shunt placement and 6 mm Hg (absolute portal venous pressure 34 mm Hg) after shunt placement. PLAN: Patient will be admitted for overnight observation. Electronically Signed   By: Juliene Balder M.D.   On: 11/19/2023 16:42   IR US  Guide Vasc Access Right Result Date: 11/19/2023 CLINICAL DATA:  73 year old with cirrhosis and refractory ascites. Patient requires frequent large volume paracentesis. MELD 3.0 = 15. EXAM: 1. Ultrasound-guided paracentesis 2. Ultrasound-guided access of the right internal jugular vein 3. Ultrasound-guided access of the right common femoral vein 4. Hepatic venogram 5. Intravascular ultrasound 6. Catheterization of the portal vein 7. Portal venous and central manometry 8. Portal venogram 9. Creation of a transhepatic portal vein to hepatic vein shunt MEDICATIONS: As antibiotic prophylaxis, Rocephin  2 g was ordered  pre-procedure and administered intravenously within one hour of incision. ANESTHESIA/SEDATION: General - as administered by the Anesthesia department CONTRAST:  75 mL Omnipaque  300, intravenous FLUOROSCOPY TIME:  Radiation Exposure Index (as provided by the fluoroscopic device): 220 mGy Kerma COMPLICATIONS: None immediate. PROCEDURE: Informed written consent was obtained from the patient after a thorough discussion of the procedural risks, benefits and alternatives. All questions were addressed. Maximal Sterile Barrier Technique was utilized including caps, mask, sterile gowns, sterile gloves, sterile drape, hand hygiene and skin antiseptic. A timeout was performed prior to the initiation of the procedure. Dr. Wilkie Lent assisted with the procedure. Right abdomen, right groin and right neck were prepped and draped in sterile fashion. Ultrasound demonstrated a large pocket of fluid in the right abdomen. Ultrasound image was saved for documentation. Skin was anesthetized with 1% lidocaine . Using ultrasound guidance, paracentesis catheter was directed into the ascites. Approximately 5 L of yellow fluid was drained during the procedure. Paracentesis catheter was removed after the paracentesis. Bandage placed over the puncture site. Ultrasound confirmed a patent right common femoral vein. Using a combination of fluoroscopy and ultrasound, an access site was determined. A small dermatotomy was made at the planned puncture site. Using ultrasound guidance, access into the right common femoral vein was obtained with visualization of needle entry into the vessel using a standard micropuncture technique. Wire was advanced into the IVC. An 8 French vascular sheath was placed. Through this access site, an 45 French Accunav ICE catheter was advanced with ease under fluoroscopic guidance to the level of the intrahepatic inferior vena cava. A preliminary ultrasound of the right neck was performed and demonstrates a patent  internal jugular vein. A permanent ultrasound image was recorded. Using a combination of fluoroscopy and ultrasound, an access site was determined. A small dermatotomy was made at the planned puncture  site. Using ultrasound guidance, access into the right internal jugular vein was obtained with visualization of needle entry into the vessel using a standard micropuncture technique. A wire was advanced into the IVC and serial fascial dilation performed. A 10 French tips sheath was placed into the internal jugular vein and advanced to the IVC. The jugular sheath was retracted into the right atrium and manometry was performed. A 5 French angled tip catheter was then directed into the middle hepatic vein. Hepatic venogram was performed. These images demonstrated a patent hepatic vein with no stenosis. The catheter was advanced to a wedge portion of the patent vein over which the 10 French sheath was advanced into the middle hepatic vein. Using ICE ultrasound visualization the catheter and middle hepatic vein as well as the portal anatomy was defined. A planned exit site from the hepatic vein and puncture site from the portal vein was placed into a single sonographic plane. Under direct ultrasound visualization, the ScorpionX needle was advanced into the portal venous system near the portal vein bifurcation. A Glidewire Advantage was then advanced into the splenic vein. A 5 French marking pigtail catheter was then advanced over the wire into the main portal vein. It was difficult to remove the wire from the pigtail catheter. It was noted that the [facility] Advantage coating had sheared off and was trapped within the pigtail catheter. We were unable to advance a microwire or microcatheter through the pigtail catheter. We attempted to snare the wire coating with micro snare devices but this was also unsuccessful. Back of a stiff Glidewire was advanced into the pigtail catheter adjacent to the sheared off wire coating. The  pigtail catheter was advanced further into the portal venous system until the wire was near the portal venous system. The stiff wire never exited the pigtail catheter. The pigtail catheter hub was cut. A 6 French Neuron MAX sheath was advanced over the pigtail catheter and through the 10 French sheath. The 6 French sheath was successfully advanced over the pigtail catheter and into the portal venous system using fluoroscopy. Once the 6 French sheath was within the portal venous system, the pigtail catheter was completely removed. Portal venogram was performed through the 6 French vascular sheath. Superstiff Amplatz wire was placed. A new pigtail catheter was placed. Portal manometry was then performed. The tract was then dilated to 8 mm with an 8 mm x 8 cm Athletis balloon. A 8-10 mm by 8 + 2 cm of Viatorr endograft was placed. This was ultimately dilated to 8 mm. After placement of the shunt, right atrial and portal pressures were repeated. Completion portal venogram demonstrates a patent TIPS endograft without significant gastroesophageal varices. The catheters and sheath were removed and manual compression was applied to the right internal jugular and right common femoral venous access sites until hemostasis was achieved. The patient was transferred to the PACU in stable condition. Pre-TIPS Mean Pressures (mmHg): Right atrium: 10 Portal vein: 39 Portosystemic gradient: 29 Post-TIPS Mean Pressures (mmHg): Right atrium:28 Portal vein: 34 Portosystemic gradient: 6 FINDINGS: Main portal vein and TIPS stent were widely patent at the end of the procedure. No significant filling of varices on the final portogram images. 5 L of ascites was removed. IMPRESSION: 1. Successful transjugular portosystemic shunt creation. 2. Portosystemic gradient of 29 mm Hg (absolute portal venous pressure 39 mm Hg) before shunt placement and 6 mm Hg (absolute portal venous pressure 34 mm Hg) after shunt placement. PLAN: Patient will be  admitted for overnight observation.  Electronically Signed   By: Juliene Balder M.D.   On: 11/19/2023 16:42   IR Paracentesis Result Date: 11/19/2023 CLINICAL DATA:  73 year old with cirrhosis and refractory ascites. Patient requires frequent large volume paracentesis. MELD 3.0 = 15. EXAM: 1. Ultrasound-guided paracentesis 2. Ultrasound-guided access of the right internal jugular vein 3. Ultrasound-guided access of the right common femoral vein 4. Hepatic venogram 5. Intravascular ultrasound 6. Catheterization of the portal vein 7. Portal venous and central manometry 8. Portal venogram 9. Creation of a transhepatic portal vein to hepatic vein shunt MEDICATIONS: As antibiotic prophylaxis, Rocephin  2 g was ordered pre-procedure and administered intravenously within one hour of incision. ANESTHESIA/SEDATION: General - as administered by the Anesthesia department CONTRAST:  75 mL Omnipaque  300, intravenous FLUOROSCOPY TIME:  Radiation Exposure Index (as provided by the fluoroscopic device): 220 mGy Kerma COMPLICATIONS: None immediate. PROCEDURE: Informed written consent was obtained from the patient after a thorough discussion of the procedural risks, benefits and alternatives. All questions were addressed. Maximal Sterile Barrier Technique was utilized including caps, mask, sterile gowns, sterile gloves, sterile drape, hand hygiene and skin antiseptic. A timeout was performed prior to the initiation of the procedure. Dr. Wilkie Lent assisted with the procedure. Right abdomen, right groin and right neck were prepped and draped in sterile fashion. Ultrasound demonstrated a large pocket of fluid in the right abdomen. Ultrasound image was saved for documentation. Skin was anesthetized with 1% lidocaine . Using ultrasound guidance, paracentesis catheter was directed into the ascites. Approximately 5 L of yellow fluid was drained during the procedure. Paracentesis catheter was removed after the paracentesis. Bandage placed  over the puncture site. Ultrasound confirmed a patent right common femoral vein. Using a combination of fluoroscopy and ultrasound, an access site was determined. A small dermatotomy was made at the planned puncture site. Using ultrasound guidance, access into the right common femoral vein was obtained with visualization of needle entry into the vessel using a standard micropuncture technique. Wire was advanced into the IVC. An 8 French vascular sheath was placed. Through this access site, an 74 French Accunav ICE catheter was advanced with ease under fluoroscopic guidance to the level of the intrahepatic inferior vena cava. A preliminary ultrasound of the right neck was performed and demonstrates a patent internal jugular vein. A permanent ultrasound image was recorded. Using a combination of fluoroscopy and ultrasound, an access site was determined. A small dermatotomy was made at the planned puncture site. Using ultrasound guidance, access into the right internal jugular vein was obtained with visualization of needle entry into the vessel using a standard micropuncture technique. A wire was advanced into the IVC and serial fascial dilation performed. A 10 French tips sheath was placed into the internal jugular vein and advanced to the IVC. The jugular sheath was retracted into the right atrium and manometry was performed. A 5 French angled tip catheter was then directed into the middle hepatic vein. Hepatic venogram was performed. These images demonstrated a patent hepatic vein with no stenosis. The catheter was advanced to a wedge portion of the patent vein over which the 10 French sheath was advanced into the middle hepatic vein. Using ICE ultrasound visualization the catheter and middle hepatic vein as well as the portal anatomy was defined. A planned exit site from the hepatic vein and puncture site from the portal vein was placed into a single sonographic plane. Under direct ultrasound visualization, the  ScorpionX needle was advanced into the portal venous system near the portal vein bifurcation. A Glidewire  Advantage was then advanced into the splenic vein. A 5 French marking pigtail catheter was then advanced over the wire into the main portal vein. It was difficult to remove the wire from the pigtail catheter. It was noted that the [facility] Advantage coating had sheared off and was trapped within the pigtail catheter. We were unable to advance a microwire or microcatheter through the pigtail catheter. We attempted to snare the wire coating with micro snare devices but this was also unsuccessful. Back of a stiff Glidewire was advanced into the pigtail catheter adjacent to the sheared off wire coating. The pigtail catheter was advanced further into the portal venous system until the wire was near the portal venous system. The stiff wire never exited the pigtail catheter. The pigtail catheter hub was cut. A 6 French Neuron MAX sheath was advanced over the pigtail catheter and through the 10 French sheath. The 6 French sheath was successfully advanced over the pigtail catheter and into the portal venous system using fluoroscopy. Once the 6 French sheath was within the portal venous system, the pigtail catheter was completely removed. Portal venogram was performed through the 6 French vascular sheath. Superstiff Amplatz wire was placed. A new pigtail catheter was placed. Portal manometry was then performed. The tract was then dilated to 8 mm with an 8 mm x 8 cm Athletis balloon. A 8-10 mm by 8 + 2 cm of Viatorr endograft was placed. This was ultimately dilated to 8 mm. After placement of the shunt, right atrial and portal pressures were repeated. Completion portal venogram demonstrates a patent TIPS endograft without significant gastroesophageal varices. The catheters and sheath were removed and manual compression was applied to the right internal jugular and right common femoral venous access sites until hemostasis  was achieved. The patient was transferred to the PACU in stable condition. Pre-TIPS Mean Pressures (mmHg): Right atrium: 10 Portal vein: 39 Portosystemic gradient: 29 Post-TIPS Mean Pressures (mmHg): Right atrium:28 Portal vein: 34 Portosystemic gradient: 6 FINDINGS: Main portal vein and TIPS stent were widely patent at the end of the procedure. No significant filling of varices on the final portogram images. 5 L of ascites was removed. IMPRESSION: 1. Successful transjugular portosystemic shunt creation. 2. Portosystemic gradient of 29 mm Hg (absolute portal venous pressure 39 mm Hg) before shunt placement and 6 mm Hg (absolute portal venous pressure 34 mm Hg) after shunt placement. PLAN: Patient will be admitted for overnight observation. Electronically Signed   By: Juliene Balder M.D.   On: 11/19/2023 16:42   IR INTRAVASCULAR ULTRASOUND NON CORONARY Result Date: 11/19/2023 CLINICAL DATA:  73 year old with cirrhosis and refractory ascites. Patient requires frequent large volume paracentesis. MELD 3.0 = 15. EXAM: 1. Ultrasound-guided paracentesis 2. Ultrasound-guided access of the right internal jugular vein 3. Ultrasound-guided access of the right common femoral vein 4. Hepatic venogram 5. Intravascular ultrasound 6. Catheterization of the portal vein 7. Portal venous and central manometry 8. Portal venogram 9. Creation of a transhepatic portal vein to hepatic vein shunt MEDICATIONS: As antibiotic prophylaxis, Rocephin  2 g was ordered pre-procedure and administered intravenously within one hour of incision. ANESTHESIA/SEDATION: General - as administered by the Anesthesia department CONTRAST:  75 mL Omnipaque  300, intravenous FLUOROSCOPY TIME:  Radiation Exposure Index (as provided by the fluoroscopic device): 220 mGy Kerma COMPLICATIONS: None immediate. PROCEDURE: Informed written consent was obtained from the patient after a thorough discussion of the procedural risks, benefits and alternatives. All questions were  addressed. Maximal Sterile Barrier Technique was utilized including caps, mask,  sterile gowns, sterile gloves, sterile drape, hand hygiene and skin antiseptic. A timeout was performed prior to the initiation of the procedure. Dr. Wilkie Lent assisted with the procedure. Right abdomen, right groin and right neck were prepped and draped in sterile fashion. Ultrasound demonstrated a large pocket of fluid in the right abdomen. Ultrasound image was saved for documentation. Skin was anesthetized with 1% lidocaine . Using ultrasound guidance, paracentesis catheter was directed into the ascites. Approximately 5 L of yellow fluid was drained during the procedure. Paracentesis catheter was removed after the paracentesis. Bandage placed over the puncture site. Ultrasound confirmed a patent right common femoral vein. Using a combination of fluoroscopy and ultrasound, an access site was determined. A small dermatotomy was made at the planned puncture site. Using ultrasound guidance, access into the right common femoral vein was obtained with visualization of needle entry into the vessel using a standard micropuncture technique. Wire was advanced into the IVC. An 8 French vascular sheath was placed. Through this access site, an 32 French Accunav ICE catheter was advanced with ease under fluoroscopic guidance to the level of the intrahepatic inferior vena cava. A preliminary ultrasound of the right neck was performed and demonstrates a patent internal jugular vein. A permanent ultrasound image was recorded. Using a combination of fluoroscopy and ultrasound, an access site was determined. A small dermatotomy was made at the planned puncture site. Using ultrasound guidance, access into the right internal jugular vein was obtained with visualization of needle entry into the vessel using a standard micropuncture technique. A wire was advanced into the IVC and serial fascial dilation performed. A 10 French tips sheath was placed  into the internal jugular vein and advanced to the IVC. The jugular sheath was retracted into the right atrium and manometry was performed. A 5 French angled tip catheter was then directed into the middle hepatic vein. Hepatic venogram was performed. These images demonstrated a patent hepatic vein with no stenosis. The catheter was advanced to a wedge portion of the patent vein over which the 10 French sheath was advanced into the middle hepatic vein. Using ICE ultrasound visualization the catheter and middle hepatic vein as well as the portal anatomy was defined. A planned exit site from the hepatic vein and puncture site from the portal vein was placed into a single sonographic plane. Under direct ultrasound visualization, the ScorpionX needle was advanced into the portal venous system near the portal vein bifurcation. A Glidewire Advantage was then advanced into the splenic vein. A 5 French marking pigtail catheter was then advanced over the wire into the main portal vein. It was difficult to remove the wire from the pigtail catheter. It was noted that the [facility] Advantage coating had sheared off and was trapped within the pigtail catheter. We were unable to advance a microwire or microcatheter through the pigtail catheter. We attempted to snare the wire coating with micro snare devices but this was also unsuccessful. Back of a stiff Glidewire was advanced into the pigtail catheter adjacent to the sheared off wire coating. The pigtail catheter was advanced further into the portal venous system until the wire was near the portal venous system. The stiff wire never exited the pigtail catheter. The pigtail catheter hub was cut. A 6 French Neuron MAX sheath was advanced over the pigtail catheter and through the 10 French sheath. The 6 French sheath was successfully advanced over the pigtail catheter and into the portal venous system using fluoroscopy. Once the 6 French sheath was within the  portal venous system,  the pigtail catheter was completely removed. Portal venogram was performed through the 6 French vascular sheath. Superstiff Amplatz wire was placed. A new pigtail catheter was placed. Portal manometry was then performed. The tract was then dilated to 8 mm with an 8 mm x 8 cm Athletis balloon. A 8-10 mm by 8 + 2 cm of Viatorr endograft was placed. This was ultimately dilated to 8 mm. After placement of the shunt, right atrial and portal pressures were repeated. Completion portal venogram demonstrates a patent TIPS endograft without significant gastroesophageal varices. The catheters and sheath were removed and manual compression was applied to the right internal jugular and right common femoral venous access sites until hemostasis was achieved. The patient was transferred to the PACU in stable condition. Pre-TIPS Mean Pressures (mmHg): Right atrium: 10 Portal vein: 39 Portosystemic gradient: 29 Post-TIPS Mean Pressures (mmHg): Right atrium:28 Portal vein: 34 Portosystemic gradient: 6 FINDINGS: Main portal vein and TIPS stent were widely patent at the end of the procedure. No significant filling of varices on the final portogram images. 5 L of ascites was removed. IMPRESSION: 1. Successful transjugular portosystemic shunt creation. 2. Portosystemic gradient of 29 mm Hg (absolute portal venous pressure 39 mm Hg) before shunt placement and 6 mm Hg (absolute portal venous pressure 34 mm Hg) after shunt placement. PLAN: Patient will be admitted for overnight observation. Electronically Signed   By: Juliene Balder M.D.   On: 11/19/2023 16:42   US  Paracentesis Result Date: 11/17/2023 INDICATION: Patient with a history of cirrhosis with recurrent ascites. Interventional Radiology asked to perform a diagnostic and therapeutic paracentesis. 5 L max EXAM: ULTRASOUND GUIDED PARACENTESIS MEDICATIONS: 1% lidocaine  10 mL COMPLICATIONS: None immediate. PROCEDURE: Informed written consent was obtained from the patient after a  discussion of the risks, benefits and alternatives to treatment. A timeout was performed prior to the initiation of the procedure. Initial ultrasound scanning demonstrates a large amount of ascites within the left lower abdominal quadrant. The left lower abdomen was prepped and draped in the usual sterile fashion. 1% lidocaine  was used for local anesthesia. Following this, a 19 gauge, 7-cm, Yueh catheter was introduced. An ultrasound image was saved for documentation purposes. The paracentesis was performed. The catheter was removed and a dressing was applied. The patient tolerated the procedure well without immediate post procedural complication. Patient received post-procedure intravenous albumin ; see nursing notes for details. FINDINGS: A total of approximately 5 L of clear yellow fluid was removed. Samples were sent to the laboratory as requested by the clinical team. IMPRESSION: Successful ultrasound-guided paracentesis yielding 5 liters of peritoneal fluid. Procedure performed by: Warren Dais, NP PLAN: The patient is scheduled for TIPS procedure November 19, 2023 Electronically Signed   By: Ester Sides M.D.   On: 11/17/2023 11:33   US  Paracentesis Result Date: 11/03/2023 INDICATION: 73 year old female. History of cirrhosis with recurrent ascites. Request is for therapeutic and diagnostic paracentesis. 5 L maximum EXAM: ULTRASOUND GUIDED THERAPEUTIC AND DIAGNOSTIC PARACENTESIS MEDICATIONS: Lidocaine  1% 10 mL COMPLICATIONS: None immediate. PROCEDURE: Informed written consent was obtained from the patient after a discussion of the risks, benefits and alternatives to treatment. A timeout was performed prior to the initiation of the procedure. Initial ultrasound scanning demonstrates a large amount of ascites within the right lower abdominal quadrant. The right lower abdomen was prepped and draped in the usual sterile fashion. 1% lidocaine  was used for local anesthesia. Following this, a 19 gauge, 7-cm,  Yueh catheter was introduced. An ultrasound image was  saved for documentation purposes. The paracentesis was performed. The catheter was removed and a dressing was applied. The patient tolerated the procedure well without immediate post procedural complication. Patient received post-procedure intravenous albumin ; see nursing notes for details. FINDINGS: A total of approximately 5 L of pale yellow fluid was removed. Samples were sent to the laboratory as requested by the clinical team. IMPRESSION: Successful ultrasound-guided therapeutic and diagnostic right-sided paracentesis yielding 5 liters of peritoneal fluid. PLAN: The patient is scheduled for tips procedure November 19, 2023. Electronically Signed   By: Ester Sides M.D.   On: 11/03/2023 12:48   (Echo, Carotid, EGD, Colonoscopy, ERCP)    Subjective: Patient seen and examined.  Denies any complaints.  Breathing better.  She was mobilized in the hallway and did not require any supplemental oxygen.  Feels comfortable with plan to go home. She had multiple loose bowel movements after being on a scheduled lactulose.  She will continue lactulose with parameters at home.   Discharge Exam: Vitals:   11/23/23 0739 11/23/23 1153  BP: (!) 115/47 (!) 108/49  Pulse: 70 68  Resp: 18 18  Temp: 98.4 F (36.9 C) 98.4 F (36.9 C)  SpO2: 97% 97%   Vitals:   11/22/23 2343 11/23/23 0349 11/23/23 0739 11/23/23 1153  BP: (!) 107/47 (!) 116/54 (!) 115/47 (!) 108/49  Pulse: 72  70 68  Resp:  19 18 18   Temp: 98 F (36.7 C) 98.2 F (36.8 C) 98.4 F (36.9 C) 98.4 F (36.9 C)  TempSrc: Oral Oral Oral Oral  SpO2: 97%  97% 97%  Weight:      Height:        General: Pt is alert, awake, not in acute distress Cardiovascular: RRR, S1/S2 +, no rubs, no gallops Respiratory: CTA bilaterally, no wheezing, no rhonchi Abdominal: Soft, mildly distended.  Nontender.  No fluid thrill present. Extremities: no edema, no cyanosis    The results of significant  diagnostics from this hospitalization (including imaging, microbiology, ancillary and laboratory) are listed below for reference.     Microbiology: Recent Results (from the past 240 hours)  Gram stain     Status: None   Collection Time: 11/17/23  9:00 AM   Specimen: Ascitic  Result Value Ref Range Status   Specimen Description ASCITIC  Final   Special Requests NONE  Final   Gram Stain   Final    CYTOSPIN SMEAR NO ORGANISMS SEEN WBC PRESENT,BOTH PMN AND MONONUCLEAR Performed at [facility], [address], [place], [place] [address]    Report Status 11/17/2023 FINAL  Final  Culture, body fluid w Gram Stain-bottle     Status: None   Collection Time: 11/17/23  9:00 AM   Specimen: Ascitic  Result Value Ref Range Status   Specimen Description ASCITIC  Final   Special Requests   Final    BOTTLES DRAWN AEROBIC AND ANAEROBIC Blood Culture adequate volume   Culture   Final    NO GROWTH 5 DAYS Performed at [facility], [address], [place], [place] [address]    Report Status 11/22/2023 FINAL  Final     Labs: BNP (last 3 results) Recent Labs    11/21/23 1022  BNP 1,472.3*   Basic Metabolic Panel: Recent Labs  Lab 11/19/23 0630 11/19/23 0857 11/19/23 1033 11/20/23 0550 11/21/23 0418 11/22/23 0528 11/23/23 0249  NA 140   < > 145 136 139 140 140  K 4.0   < > 3.9 4.2 3.5 3.3* 4.3  CL  107  --   --  108 109 110 112*  CO2 22  --   --  16* 20* 21* 20*  GLUCOSE 149*  --   --  194* 196* 155* 237*  BUN 11  --   --  15 16 16 15   CREATININE 1.31*  --   --  1.41* 1.61* 1.11* 1.13*  CALCIUM  8.8*  --   --  8.4* 8.9 8.5* 8.5*  MG  --   --   --   --   --  1.5*  --    < > = values in this interval not displayed.   Liver Function Tests: Recent Labs  Lab 11/19/23 0630 11/20/23 0550 11/21/23 0418 11/22/23 0528  AST 24 31 58* 53*  ALT 14 11 27  36  ALKPHOS 112 63 87 82  BILITOT 1.9* 1.3* 1.5* 1.9*  PROT 6.6 5.4* 5.6* 5.2*  ALBUMIN  3.5 3.4* 3.5 3.0*   No results for input(s):  LIPASE, AMYLASE in the last 168 hours. No results for input(s): AMMONIA in the last 168 hours. CBC: Recent Labs  Lab 11/19/23 0630 11/19/23 0857 11/19/23 1033 11/20/23 0550 11/21/23 0418 11/22/23 0528  WBC 3.2*  --   --  3.7* 5.2 3.7*  NEUTROABS 2.5  --   --   --   --   --   HGB 10.8* 8.5* 7.8* 8.5* 9.4* 9.0*  HCT 33.1* 25.0* 23.0* 26.2* 28.5* 26.8*  MCV 89.5  --   --  90.3 89.1 87.6  PLT 73*  --   --  60* 59* 64*   Cardiac Enzymes: No results for input(s): CKTOTAL, CKMB, CKMBINDEX, TROPONINI in the last 168 hours. BNP: Invalid input(s): POCBNP CBG: Recent Labs  Lab 11/19/23 0602 11/19/23 0818 11/19/23 1220  GLUCAP 137* 143* 136*   D-Dimer No results for input(s): DDIMER in the last 72 hours. Hgb A1c No results for input(s): HGBA1C in the last 72 hours. Lipid Profile No results for input(s): CHOL, HDL, LDLCALC, TRIG, CHOLHDL, LDLDIRECT in the last 72 hours. Thyroid  function studies No results for input(s): TSH, T4TOTAL, T3FREE, THYROIDAB in the last 72 hours.  Invalid input(s): FREET3 Anemia work up No results for input(s): VITAMINB12, FOLATE, FERRITIN, TIBC, IRON, RETICCTPCT in the last 72 hours. Urinalysis    Component Value Date/Time   COLORURINE YELLOW 07/25/2022 1500   APPEARANCEUR Clear 01/14/2023 1045   LABSPEC 1.013 07/25/2022 1500   PHURINE 6.0 07/25/2022 1500   GLUCOSEU Negative 01/14/2023 1045   HGBUR NEGATIVE 07/25/2022 1500   BILIRUBINUR Negative 01/14/2023 1045   KETONESUR NEGATIVE 07/25/2022 1500   PROTEINUR Negative 01/14/2023 1045   PROTEINUR NEGATIVE 07/25/2022 1500   UROBILINOGEN 1.0 10/08/2015 1347   NITRITE Negative 01/14/2023 1045   NITRITE NEGATIVE 07/25/2022 1500   LEUKOCYTESUR 1+ (A) 01/14/2023 1045   LEUKOCYTESUR NEGATIVE 07/25/2022 1500   Sepsis Labs Recent Labs  Lab 11/19/23 0630 11/20/23 0550 11/21/23 0418 11/22/23 0528  WBC 3.2* 3.7* 5.2 3.7*   Microbiology Recent  Results (from the past 240 hours)  Gram stain     Status: None   Collection Time: 11/17/23  9:00 AM   Specimen: Ascitic  Result Value Ref Range Status   Specimen Description ASCITIC  Final   Special Requests NONE  Final   Gram Stain   Final    CYTOSPIN SMEAR NO ORGANISMS SEEN WBC PRESENT,BOTH PMN AND MONONUCLEAR Performed at [facility], [address], [place], [place] [address]    Report Status 11/17/2023 FINAL  Final  Culture, body fluid w Gram Stain-bottle     Status: None   Collection Time: 11/17/23  9:00 AM   Specimen: Ascitic  Result Value Ref Range Status   Specimen Description ASCITIC  Final   Special Requests   Final    BOTTLES DRAWN AEROBIC AND ANAEROBIC Blood Culture adequate volume   Culture   Final    NO GROWTH 5 DAYS Performed at [facility], [address] [place], [place] [address]    Report Status 11/22/2023 FINAL  Final     Time coordinating discharge: 35 minutes  SIGNED:   Renato Applebaum, MD  [facility] 11/23/2023, 12:48 PM

## 2023-11-23 NOTE — Progress Notes (Signed)
 Patient and patient's son given discharge instructions. IV removed without issue. Patient's property accounted for and returned to son. Patient transported off unit in wheelchair. Left campus in personal vehicle driven by son.

## 2023-11-23 NOTE — Progress Notes (Signed)
   11/23/23 1153  Oxygen Therapy  SpO2 97 %  O2 Device Room Air  Patient Activity (if Appropriate) Ambulating

## 2023-11-23 NOTE — TOC Progression Note (Signed)
 Transition of Care [facility]) - Progression Note    Patient Details  Name: Andrea Dickson MRN: [id number] Date of Birth: 01-22-1950  Transition of Care [facility]) CM/SW Contact  Nola Devere Hands, RN Phone Number: 11/23/2023, 2:20 PM  Clinical Narrative:    Case Manager spoke with patient concerning recommendation for Home Health therapies. Patient has no preference for agency, referral called to [facility], Liaison with [facility]. Rollator and 3in1 have been requested from [employer]. To be delivered to patient's room. Patient concerned that insurance may not cover Rollator, CM let her know that she will be informed if that is the case. Patient will dc home with family.    Expected Discharge Plan: Home w Home Health Services Barriers to Discharge: No Barriers Identified               Expected Discharge Plan and Services   Discharge Planning Services: CM Consult Post Acute Care Choice: Durable Medical Equipment, Home Health Living arrangements for the past 2 months: Single Family Home Expected Discharge Date: 11/23/23               DME Arranged: 3-N-1, Vannie rolling with seat DME Agency: [facility] Date DME Agency Contacted: 11/23/23 Time DME Agency Contacted: 1416 Representative spoke with at DME Agency: [facility] Arranged: PT, OT [facility] Agency: [facility]   Time [facility] Agency Contacted: 1410 Representative spoke with at [facility] Agency: Amy Network Engineer   Social Drivers of Health (SDOH) Interventions SDOH Screenings   Food Insecurity: No Food Insecurity (11/19/2023)  Housing: Low Risk  (11/19/2023)  Transportation Needs: No Transportation Needs (11/19/2023)  Utilities: Not At Risk (11/19/2023)  Depression (PHQ2-9): Low Risk  (09/30/2021)  Financial Resource Strain: Low Risk (11/10/2021)   Received from [facility]  Physical Activity: Inactive (11/10/2021)   Received from [facility]  Social Connections: Socially Isolated (11/19/2023)  Stress: No Stress Concern  Present (11/10/2021)   [facility] from [facility] [address]  Tobacco Use: Medium Risk (11/19/2023)  Health Literacy: Medium Risk (11/10/2021)   Received from [facility]    Readmission Risk Interventions     No data to display

## 2023-11-24 ENCOUNTER — Inpatient Hospital Stay: Admitting: Gastroenterology

## 2023-11-25 ENCOUNTER — Ambulatory Visit: Payer: Self-pay

## 2023-11-25 NOTE — Telephone Encounter (Signed)
 FYI Only or Action Required?: FYI only for provider: advised F/U with PCP/pharmacy.   Called Nurse Triage reporting Breathing Problem and Hospitalization Follow-up.   Triage Disposition: Call PCP Within 24 Hours  Patient/caregiver understands and will follow disposition?: Yes       Copied from CRM [id number]. Topic: Clinical - Red Word Triage >> Nov 25, 2023  9:10 AM Diannia H wrote: [facility] that prompted transfer to Nurse Triage: Patient is having a hard time breathing. She think she is needing oxygen. When she is physically moving around she is short of breath. She said after she had a procedure done on the 7th this problem came about and she stated they told her it could be heart failure. Reason for Disposition  Condition / symptoms SAME (not improving)    Pt calling Nurse Advise line for Rx help. Pt reports that she does not have lactulose listed in D/C paperwork. Triager advised her to call [facility]) to check on status. Triager also advised pt to call PCP to discuss HFU.  Answer Assessment - Initial Assessment Questions 1. RESPIRATORY STATUS: Describe your breathing? (e.g., wheezing, shortness of breath, unable to speak, severe coughing)      SOB 2. ONSET: When did this breathing problem begin?      Since hospitalization Recent discharged from hospital -- pt endorsed that they did not feel like O2 was needed 3. PATTERN Does the difficult breathing come and go, or has it been constant since it started?      Comes and goes 4. SEVERITY: How bad is your breathing? (e.g., mild, moderate, severe)      Mild with exertion Triager does appreciate mild SOB during call. Pt is speaking in full sentences.  5. RECURRENT SYMPTOM: Have you had difficulty breathing before? If Yes, ask: When was the last time? and What happened that time?      denies 6. CARDIAC HISTORY: Do you have any history of heart disease? (e.g., heart attack, angina, bypass surgery, angioplasty)       denies 7. LUNG HISTORY: Do you have any history of lung disease?  (e.g., pulmonary embolus, asthma, emphysema)     Thinks asthma 8. CAUSE: What do you think is causing the breathing problem?      Unknown,  but thinks she needs O2 9. OTHER SYMPTOMS: Do you have any other symptoms? (e.g., chest pain, cough, dizziness, fever, runny nose)     denies 10. O2 SATURATION MONITOR:  Do you use an oxygen saturation monitor (pulse oximeter) at home? If Yes, ask: What is your reading (oxygen level) today? What is your usual oxygen saturation reading? (e.g., 95%)       N/a 11. PREGNANCY: Is there any chance you are pregnant? When was your last menstrual period?       N/a 12. TRAVEL: Have you traveled out of the country in the last month? (e.g., travel history, exposures)       N/a  Answer Assessment - Initial Assessment Questions 1. MAIN CONCERN OR SYMPTOM:  What is your main concern right now? What question do you have? What's the main symptom you're worried about? (e.g., breathing difficulty, ankle swelling, weight gain.)     *No Answer* 2. ONSET: When did the  *No Answer*  start?     *No Answer* 3. BETTER-SAME-WORSE: Are you getting better, staying the same, or getting worse compared to the day you were discharged?     same 4. HOSPITALIZATION: How long were you hospitalized? (e.g., days)  See chart 5. DISCHARGE DIAGNOSIS:  What problem or disease were you hospitalized for?     See chart 6. DISCHARGE DATE: What date were you discharged from the hospital?     See chart 7. DISCHARGE DOCTOR: Who is the main doctor taking care of you now?     See chart 8. DISCHARGE APPOINTMENT: Have you scheduled a follow-up discharge appointment with your doctor?     Yes, has upcoming cardiology and PCP appt scheduled 9. DISCHARGE MEDICINES: Did the doctor (or NP/PA) who discharged you order any new medicines for you to use? If yes, have you filled the prescription and  started taking the medicine?      See chart 10. PAIN: Is there any pain? If Yes, ask: How bad is it?  (Scale 0-10; or none, mild, moderate, severe)       *No Answer* 11. FEVER: Do you have a fever? If Yes, ask: What is it, how was it measured  and when did it start?       *No Answer* 12. OTHER SYMPTOMS: Do you have any other symptoms?       *No Answer*  Protocols used: Breathing Difficulty-A-AH, Post-Hospitalization Follow-up Call-A-AH

## 2023-11-26 ENCOUNTER — Telehealth (HOSPITAL_COMMUNITY): Payer: Self-pay | Admitting: Student

## 2023-11-26 NOTE — Telephone Encounter (Signed)
   [facility] TIPS post-procedure phone call follow-up   Andrea Dickson is a 73 y.o. female who underwent TIPS creation at [facility] 11/19/23 by Dr. Philip  Patient is 1 week from procedure.   # of paracentesis in last month: 3 # of paracentesis in last 2 months: 5  Current diuretic regimen: Lasix  40 mg daily, aldactone  100 mg daily  Current pharmacologic encephalopathy prophylaxis/treatment: Lactulose 30 g BID  Post-TIPS Imaging: None   Labs: Creatinine: 1.13 Total Bilirubin: 1.9 INR: 1.4 Sodium: 140 Albumin : 3.0 Ammonia: n/a  Assessment: Andrea Dickson is a 73 y.o. female with history of MASH CIRRHOSIS. Patient is one week from TIPS creation and is doing well. She was admitted for several days post-procedure due to new onset CHF and AKI. She was discharged from the hospital 11/23/23.   She reports some shortness of breath with activity but otherwise has minimal complaints. She is sleeping well and doesn't feel like she has any abdominal distention. Her appetite is lower than usual. Her procedure sites have healed and there are no issues with the right IJ or right femoral access sites. She is taking her diuretics but has not been taking lactulose. She was told at hospital discharge that she could take this medication as needed. She is now aware this medication needs to be BID, every day. We discussed titrating the medication to achieve 2-3 soft bowel movements daily. We also discussed the signs/symptoms of hepatic encephalopathy. She lives alone but has a multiple people that check on her regularly. Andrea Dickson has the number to the IR APP office and she knows she can call this number and ask for me if she has any questions/concerns. She knows I will call her next week to check in.   Recommendation:  Continue current treatment plan with next Clinic follow up scheduled for 12/03/23.  Electronically Signed: Warren JONELLE Dais, NP 11/26/2023, 10:14  AM

## 2023-11-29 ENCOUNTER — Telehealth: Payer: Self-pay

## 2023-11-29 DIAGNOSIS — I5033 Acute on chronic diastolic (congestive) heart failure: Secondary | ICD-10-CM

## 2023-12-03 ENCOUNTER — Telehealth (HOSPITAL_COMMUNITY): Payer: Self-pay | Admitting: Student

## 2023-12-03 MED ORDER — LACTULOSE 10 GM/15ML PO SOLN: 30.0000 g | Freq: Two times a day (BID) | ORAL | 6 refills | Status: AC

## 2023-12-03 NOTE — Telephone Encounter (Signed)
   [facility] TIPS post-procedure phone call follow-up   Andrea Dickson is a 73 y.o. female who underwent TIPS creation at [facility] 11/19/23 by Dr. Philip   Patient is 2 week from procedure.    # of paracentesis in last month: 3 # of paracentesis in last 2 months: 5   Current diuretic regimen: Lasix  40 mg daily, aldactone  100 mg daily  Current pharmacologic encephalopathy prophylaxis/treatment: Lactulose  30 g BID   Post-TIPS Imaging: None   Labs: Creatinine: 1.13 Total Bilirubin: 1.9 INR: 1.4 Sodium: 140 Albumin : 3.0 Ammonia: n/a  Assessment: Andrea Dickson is a 73 y.o. female with history of MASH CIRRHOSIS. Patient is 2 weeks from TIPS creation and continues to have fatigue with too much activity but is otherwise feeling fine. Her last paracentesis was the day of her TIPS and she does not feel any abdominal distention. Since her TIPS she has gained approximately 2-3 pounds and she thinks the swelling in her lower extremities has gone down. She denies pain or shortness of breath. She is eating/drinking well but is only able to sleep a few hours a night.  We discussed her medications and she stated she was taking everything as directed but she has stopped taking lactulose  due to her supply running out. I've sent in a new prescription and reiterated the necessity of this medication. I advised her to continue taking this medication as directed until otherwise instructed. Patient verbalized understanding. I also contacted the patient's GI team Carylon Melia, NP) to discuss the lactulose  prescription and future GI follow up. Charmaine stated they would follow up with the patient soon.    Recommendation:  Continue current treatment plan. She should follow up with Dr. Philip in approximately 2 weeks. The patient is aware a scheduler from our clinic will call her with a date/time of her appointment. The patient knows she can call me with any questions/concerns  prior to her follow up with Dr. Philip.    Electronically Signed: Warren JONELLE Dais, NP 12/03/2023, 12:31 PM

## 2023-12-07 ENCOUNTER — Ambulatory Visit: Attending: Student | Admitting: Student

## 2023-12-07 ENCOUNTER — Encounter: Payer: Self-pay | Admitting: *Deleted

## 2023-12-07 ENCOUNTER — Encounter: Payer: Self-pay | Admitting: Student

## 2023-12-07 ENCOUNTER — Encounter: Payer: Self-pay | Admitting: Family Medicine

## 2023-12-07 VITALS — BP 110/58 | HR 66 | Ht 66.0 in | Wt 150.0 lb

## 2023-12-07 DIAGNOSIS — R079 Chest pain, unspecified: Secondary | ICD-10-CM | POA: Diagnosis not present

## 2023-12-07 DIAGNOSIS — D696 Thrombocytopenia, unspecified: Secondary | ICD-10-CM | POA: Diagnosis not present

## 2023-12-07 DIAGNOSIS — K7469 Other cirrhosis of liver: Secondary | ICD-10-CM | POA: Diagnosis not present

## 2023-12-07 MED ORDER — NITROGLYCERIN 0.4 MG SL SUBL: 0.4000 mg | SUBLINGUAL_TABLET | SUBLINGUAL | 3 refills | Status: AC | PRN

## 2023-12-07 MED ORDER — NITROGLYCERIN 0.4 MG SL SUBL: 0.4000 mg | SUBLINGUAL_TABLET | SUBLINGUAL | 3 refills | Status: DC | PRN

## 2023-12-07 NOTE — Patient Instructions (Signed)
 Medication Instructions:  Your physician recommends that you continue on your current medications as directed. Please refer to the Current Medication list given to you today.  *If you need a refill on your cardiac medications before your next appointment, please call your pharmacy*  Lab Work: NONE   If you have labs (blood work) drawn today and your tests are completely normal, you will receive your results only by: MyChart Message (if you have MyChart) OR A paper copy in the mail If you have any lab test that is abnormal or we need to change your treatment, we will call you to review the results.  Testing/Procedures: NONE   Follow-Up: At [facility], you and your health needs are our priority.  As part of our continuing mission to provide you with exceptional heart care, our providers are all part of one team.  This team includes your primary Cardiologist (physician) and Advanced Practice Providers or APPs (Physician Assistants and Nurse Practitioners) who all work together to provide you with the care you need, when you need it.  Your next appointment:   2 month(s)  Provider:   You may see Alvan Carrier, MD or one of the following Advanced Practice Providers on your designated Care Team:   Laymon Qua, PA-C  [place], [place] Olivia [place], [place]     We recommend signing up for the patient portal called MyChart.  Sign up information is provided on this After Visit Summary.  MyChart is used to connect with patients for Virtual Visits (Telemedicine).  Patients are able to view lab/test results, encounter notes, upcoming appointments, etc.  Non-urgent messages can be sent to your provider as well.   To learn more about what you can do with MyChart, go to [email or web address].   Other Instructions Thank you for choosing [facility]!

## 2023-12-07 NOTE — Progress Notes (Signed)
 Cardiology Office Note    Date:  12/07/2023  ID:  Anaid, Haney 1951-01-08, MRN [id number] Cardiologist: Alvan Carrier, MD Cardiology APP:  Johnson Laymon HERO, PA-C { :  History of Present Illness:    Andrea Dickson is a 73 y.o. female with past medical history of NASH cirrhosis with ascites and esophageal varices, HTN, HLD, Type II DM, GERD and recurrent UTI's who presents to the office today for hospital follow-up.  She was admitted to [facility] on 11/19/2023 for TIPS procedure given cirrhosis and recurrent ascites. Following the procedure, she was noted to have episodes of chest pain. She was volume overloaded and it was felt that her shortness of breath and chest pain were likely due to the rapid increase in preload following the procedure. Her EKG showed no acute changes and troponin values were flat at 41 and 35. A repeat echocardiogram was obtained which showed a hyperdynamic LVEF of 70 to 75% with no regional wall motion abnormalities. She had normal RV function and no significant valve abnormalities. Further cardiac testing was not pursued during that time and she was discharged on Lasix  40 mg daily along with Spironolactone  100 mg daily. Was also on Propranolol  80 mg daily and Spironolactone  100 mg daily.  In talking with the patient and her sister today, she reports still having intermittent dyspnea which typically occurs with activity. No specific orthopnea or PND. Has intermittent lower extremity edema and tries to limit her sodium intake. Still having occasional episodes of chest pain which can occur at rest or with activity and last for several minutes and then resolve. Not associated with positional changes or food consumption. Physical therapy has been coming to her home and blood pressure has been soft at times with SBP in the low 100's but she denies any associated lightheadedness, dizziness or presyncope with this.  Studies Reviewed:   EKG: EKG is not ordered  today.  Echocardiogram: 11/2023 IMPRESSIONS     1. Left ventricular ejection fraction, by estimation, is 70 to 75%. The  left ventricle has hyperdynamic function. The left ventricle has no  regional wall motion abnormalities. Left ventricular diastolic parameters  are indeterminate.   2. Right ventricular systolic function is normal. The right ventricular  size is normal. Tricuspid regurgitation signal is inadequate for assessing  PA pressure.   3. The mitral valve is normal in structure. No evidence of mitral valve  regurgitation. No evidence of mitral stenosis.   4. The tricuspid valve is abnormal.   5. The aortic valve is tricuspid. Aortic valve regurgitation is not  visualized. No aortic stenosis is present.   6. The inferior vena cava is dilated in size with <50% respiratory  variability, suggesting right atrial pressure of 15 mmHg.    Physical Exam:   VS:  BP (!) 110/58 (BP Location: Right Arm, Cuff Size: Normal)   Pulse 66   Ht 5' 6 (1.676 m)   Wt 150 lb (68 kg)   SpO2 100%   BMI 24.21 kg/m    Wt Readings from Last 3 Encounters:  12/07/23 150 lb (68 kg)  11/22/23 158 lb 15.2 oz (72.1 kg)  09/07/23 165 lb 3.2 oz (74.9 kg)     GEN: Well nourished, well developed female appearing in no acute distress NECK: No JVD; No carotid bruits CARDIAC: RRR, no murmurs, rubs, gallops RESPIRATORY:  Clear to auscultation without rales, wheezing or rhonchi  ABDOMEN: Appears non-distended. No obvious abdominal masses. EXTREMITIES: No clubbing or cyanosis.  No pitting edema.  Distal pedal pulses are 2+ bilaterally.   Assessment and Plan:   1. Chest pain of uncertain etiology - She has been having intermittent episodes of chest pain since her hospitalization and episodes can occur at rest or with activity. Echocardiogram showed a preserved EF with no wall motion abnormalities. - As discussed during her admission, she is not an ideal candidate for CAD evaluation given her cirrhosis  with esophageal varices, anemia and thrombocytopenia. I will review with GI but do not anticipate that she would be able to tolerate DAPT. Therefore, pursuing a stress test or Coronary CTA would likely not change the treatment options. Will provide with an Rx for SL NTG. If symptoms improve with this, may be able to tolerate Imdur pending reassessment of BP.  If BP does not allow, could consider Ranexa. She is on Propranolol  for her cirrhosis and has not been on statin therapy given elevated LFT's but is on Zetia  10 mg daily.  2. Other cirrhosis of liver (HCC) - Being followed by GI and recently underwent TIPS procedure. She did have fluid overload at that time but symptoms have now improved. Remains on Lasix  40mg  daily, Propranolol  ER 80mg  daily and Spironolactone  100mg  daily.  3. Anemia/Thrombocytopenia - CBC on 11/22/2023 showed Hgb at 9.0 and platelets at 64K. She did have labs with her PCP last week and we will request a copy of these.  Signed, Laymon CHRISTELLA Qua, PA-C

## 2023-12-08 ENCOUNTER — Telehealth: Payer: Self-pay

## 2023-12-08 NOTE — Progress Notes (Signed)
 Complex Care Management Note Care Guide Note  12/08/2023 Name: Andrea Dickson MRN: [id number] DOB: Mar 14, 1950   Complex Care Management Outreach Attempts: An unsuccessful telephone outreach was attempted today to offer the patient information about available complex care management services.  Follow Up Plan:  Additional outreach attempts will be made to offer the patient complex care management information and services.   Encounter Outcome:  No Answer-Left voicemail   Leotis Rase [facility] [place], [facility] Guide  Direct Dial: [phone]  Fax [phone]

## 2023-12-10 ENCOUNTER — Inpatient Hospital Stay (HOSPITAL_COMMUNITY)
Admission: EM | Admit: 2023-12-10 | Discharge: 2023-12-13 | DRG: 442 | Disposition: A | Attending: Internal Medicine | Admitting: Internal Medicine

## 2023-12-10 ENCOUNTER — Encounter (HOSPITAL_COMMUNITY): Payer: Self-pay | Admitting: *Deleted

## 2023-12-10 ENCOUNTER — Other Ambulatory Visit: Payer: Self-pay

## 2023-12-10 ENCOUNTER — Emergency Department (HOSPITAL_COMMUNITY)

## 2023-12-10 DIAGNOSIS — G250 Essential tremor: Secondary | ICD-10-CM | POA: Diagnosis present

## 2023-12-10 DIAGNOSIS — F17200 Nicotine dependence, unspecified, uncomplicated: Secondary | ICD-10-CM | POA: Diagnosis present

## 2023-12-10 DIAGNOSIS — I5032 Chronic diastolic (congestive) heart failure: Secondary | ICD-10-CM | POA: Diagnosis present

## 2023-12-10 DIAGNOSIS — E118 Type 2 diabetes mellitus with unspecified complications: Secondary | ICD-10-CM | POA: Insufficient documentation

## 2023-12-10 DIAGNOSIS — F319 Bipolar disorder, unspecified: Secondary | ICD-10-CM | POA: Diagnosis present

## 2023-12-10 DIAGNOSIS — K7581 Nonalcoholic steatohepatitis (NASH): Secondary | ICD-10-CM | POA: Diagnosis present

## 2023-12-10 DIAGNOSIS — N1831 Chronic kidney disease, stage 3a: Secondary | ICD-10-CM | POA: Diagnosis present

## 2023-12-10 DIAGNOSIS — K219 Gastro-esophageal reflux disease without esophagitis: Secondary | ICD-10-CM | POA: Diagnosis present

## 2023-12-10 DIAGNOSIS — Z79899 Other long term (current) drug therapy: Secondary | ICD-10-CM

## 2023-12-10 DIAGNOSIS — Y636 Underdosing and nonadministration of necessary drug, medicament or biological substance: Secondary | ICD-10-CM | POA: Diagnosis present

## 2023-12-10 DIAGNOSIS — E876 Hypokalemia: Secondary | ICD-10-CM | POA: Diagnosis present

## 2023-12-10 DIAGNOSIS — K7460 Unspecified cirrhosis of liver: Secondary | ICD-10-CM | POA: Diagnosis present

## 2023-12-10 DIAGNOSIS — E782 Mixed hyperlipidemia: Secondary | ICD-10-CM | POA: Diagnosis present

## 2023-12-10 DIAGNOSIS — I130 Hypertensive heart and chronic kidney disease with heart failure and stage 1 through stage 4 chronic kidney disease, or unspecified chronic kidney disease: Secondary | ICD-10-CM | POA: Diagnosis present

## 2023-12-10 DIAGNOSIS — K7682 Hepatic encephalopathy: Secondary | ICD-10-CM | POA: Diagnosis not present

## 2023-12-10 DIAGNOSIS — Z808 Family history of malignant neoplasm of other organs or systems: Secondary | ICD-10-CM

## 2023-12-10 DIAGNOSIS — Z85828 Personal history of other malignant neoplasm of skin: Secondary | ICD-10-CM

## 2023-12-10 DIAGNOSIS — J45909 Unspecified asthma, uncomplicated: Secondary | ICD-10-CM | POA: Diagnosis present

## 2023-12-10 DIAGNOSIS — Z888 Allergy status to other drugs, medicaments and biological substances status: Secondary | ICD-10-CM

## 2023-12-10 DIAGNOSIS — Z8249 Family history of ischemic heart disease and other diseases of the circulatory system: Secondary | ICD-10-CM

## 2023-12-10 DIAGNOSIS — Z91030 Bee allergy status: Secondary | ICD-10-CM

## 2023-12-10 DIAGNOSIS — Z860100 Personal history of colon polyps, unspecified: Secondary | ICD-10-CM

## 2023-12-10 DIAGNOSIS — E1122 Type 2 diabetes mellitus with diabetic chronic kidney disease: Secondary | ICD-10-CM | POA: Diagnosis present

## 2023-12-10 DIAGNOSIS — Z818 Family history of other mental and behavioral disorders: Secondary | ICD-10-CM

## 2023-12-10 DIAGNOSIS — Z91048 Other nonmedicinal substance allergy status: Secondary | ICD-10-CM

## 2023-12-10 DIAGNOSIS — Z79818 Long term (current) use of other agents affecting estrogen receptors and estrogen levels: Secondary | ICD-10-CM

## 2023-12-10 DIAGNOSIS — E872 Acidosis, unspecified: Secondary | ICD-10-CM | POA: Diagnosis present

## 2023-12-10 DIAGNOSIS — Z95828 Presence of other vascular implants and grafts: Secondary | ICD-10-CM

## 2023-12-10 DIAGNOSIS — Z833 Family history of diabetes mellitus: Secondary | ICD-10-CM

## 2023-12-10 DIAGNOSIS — Z87891 Personal history of nicotine dependence: Secondary | ICD-10-CM

## 2023-12-10 DIAGNOSIS — Z66 Do not resuscitate: Secondary | ICD-10-CM | POA: Diagnosis present

## 2023-12-10 LAB — URINALYSIS, ROUTINE W REFLEX MICROSCOPIC
Bilirubin Urine: NEGATIVE
Glucose, UA: NEGATIVE mg/dL
Hgb urine dipstick: NEGATIVE
Ketones, ur: NEGATIVE mg/dL
Leukocytes,Ua: NEGATIVE
Nitrite: NEGATIVE
Protein, ur: NEGATIVE mg/dL
Specific Gravity, Urine: 1.009 (ref 1.005–1.030)
pH: 5 (ref 5.0–8.0)

## 2023-12-10 LAB — COMPREHENSIVE METABOLIC PANEL WITH GFR
ALT: 19 U/L (ref 0–44)
AST: 43 U/L — ABNORMAL HIGH (ref 15–41)
Albumin: 4.4 g/dL (ref 3.5–5.0)
Alkaline Phosphatase: 198 U/L — ABNORMAL HIGH (ref 38–126)
Anion gap: 15 (ref 5–15)
BUN: 13 mg/dL (ref 8–23)
CO2: 20 mmol/L — ABNORMAL LOW (ref 22–32)
Calcium: 9.4 mg/dL (ref 8.9–10.3)
Chloride: 106 mmol/L (ref 98–111)
Creatinine, Ser: 1.29 mg/dL — ABNORMAL HIGH (ref 0.44–1.00)
GFR, Estimated: 44 mL/min — ABNORMAL LOW (ref >60)
Glucose, Bld: 169 mg/dL — ABNORMAL HIGH (ref 70–99)
Potassium: 3.3 mmol/L — ABNORMAL LOW (ref 3.5–5.1)
Sodium: 141 mmol/L (ref 135–145)
Total Bilirubin: 5.5 mg/dL — ABNORMAL HIGH (ref 0.0–1.2)
Total Protein: 7.7 g/dL (ref 6.5–8.1)

## 2023-12-10 LAB — CBG MONITORING, ED: Glucose-Capillary: 154 mg/dL — ABNORMAL HIGH (ref 70–99)

## 2023-12-10 LAB — CBC WITH DIFFERENTIAL/PLATELET
Abs Immature Granulocytes: 0.02 K/uL (ref 0.00–0.07)
Basophils Absolute: 0 K/uL (ref 0.0–0.1)
Basophils Relative: 1 %
Eosinophils Absolute: 0.1 K/uL (ref 0.0–0.5)
Eosinophils Relative: 1 %
HCT: 31.8 % — ABNORMAL LOW (ref 36.0–46.0)
Hemoglobin: 10.3 g/dL — ABNORMAL LOW (ref 12.0–15.0)
Immature Granulocytes: 1 %
Lymphocytes Relative: 20 %
Lymphs Abs: 0.8 K/uL (ref 0.7–4.0)
MCH: 30.8 pg (ref 26.0–34.0)
MCHC: 32.4 g/dL (ref 30.0–36.0)
MCV: 95.2 fL (ref 80.0–100.0)
Monocytes Absolute: 0.3 K/uL (ref 0.1–1.0)
Monocytes Relative: 6 %
Neutro Abs: 3 K/uL (ref 1.7–7.7)
Neutrophils Relative %: 71 %
Platelets: 100 K/uL — ABNORMAL LOW (ref 150–400)
RBC: 3.34 MIL/uL — ABNORMAL LOW (ref 3.87–5.11)
RDW: 22.4 % — ABNORMAL HIGH (ref 11.5–15.5)
WBC: 4.2 K/uL (ref 4.0–10.5)
nRBC: 0 % (ref 0.0–0.2)

## 2023-12-10 LAB — LACTIC ACID, PLASMA: Lactic Acid, Venous: 3.8 mmol/L (ref 0.5–1.9)

## 2023-12-10 LAB — AMMONIA: Ammonia: 72 umol/L — ABNORMAL HIGH (ref 9–35)

## 2023-12-10 MED ORDER — LACTULOSE 10 GM/15ML PO SOLN
45.0000 g | Freq: Once | ORAL | Status: AC
Start: 2023-12-10 — End: 2023-12-10
  Administered 2023-12-10: 45 g via ORAL
  Filled 2023-12-10: qty 90

## 2023-12-10 MED ORDER — LACTULOSE 10 GM/15ML PO SOLN
30.0000 g | Freq: Three times a day (TID) | ORAL | Status: DC
  Administered 2023-12-10 – 2023-12-13 (×7): 30 g via ORAL
  Filled 2023-12-10 (×8): qty 60

## 2023-12-10 MED ORDER — INSULIN ASPART 100 UNIT/ML IJ SOLN: 0.0000 [IU] | Freq: Three times a day (TID) | INTRAMUSCULAR | Status: DC

## 2023-12-10 MED ORDER — BUPROPION HCL ER (XL) 150 MG PO TB24
150.0000 mg | ORAL_TABLET | Freq: Every day | ORAL | Status: DC
  Administered 2023-12-11 – 2023-12-13 (×3): 150 mg via ORAL
  Filled 2023-12-10 (×3): qty 1

## 2023-12-10 MED ORDER — IBUPROFEN 400 MG PO TABS: 400.0000 mg | ORAL_TABLET | Freq: Once | ORAL | Status: DC

## 2023-12-10 MED ORDER — INSULIN ASPART 100 UNIT/ML IJ SOLN: 0.0000 [IU] | Freq: Every day | INTRAMUSCULAR | Status: DC

## 2023-12-10 MED ORDER — DIAZEPAM 2 MG PO TABS: 2.0000 mg | ORAL_TABLET | Freq: Every day | ORAL | Status: DC | PRN

## 2023-12-10 MED ORDER — PANCRELIPASE (LIP-PROT-AMYL) 60000-189600 UNITS PO CPEP: 1.0000 | ORAL_CAPSULE | Freq: Three times a day (TID) | ORAL | Status: DC

## 2023-12-10 MED ORDER — MIRTAZAPINE 15 MG PO TABS
15.0000 mg | ORAL_TABLET | Freq: Every day | ORAL | Status: DC
  Administered 2023-12-10 – 2023-12-12 (×3): 15 mg via ORAL
  Filled 2023-12-10 (×3): qty 1

## 2023-12-10 MED ORDER — TRIMETHOPRIM 100 MG PO TABS
100.0000 mg | ORAL_TABLET | Freq: Every day | ORAL | Status: DC
  Administered 2023-12-10 – 2023-12-12 (×3): 100 mg via ORAL
  Filled 2023-12-10 (×4): qty 1

## 2023-12-10 MED ORDER — FUROSEMIDE 40 MG PO TABS: 40.0000 mg | ORAL_TABLET | Freq: Every day | ORAL | Status: DC

## 2023-12-10 MED ORDER — PANTOPRAZOLE SODIUM 40 MG PO TBEC
40.0000 mg | DELAYED_RELEASE_TABLET | Freq: Every day | ORAL | Status: DC
  Administered 2023-12-11 – 2023-12-13 (×3): 40 mg via ORAL
  Filled 2023-12-10 (×3): qty 1

## 2023-12-10 MED ORDER — PANCRELIPASE (LIP-PROT-AMYL) 36000-114000 UNITS PO CPEP
60000.0000 [IU] | ORAL_CAPSULE | Freq: Three times a day (TID) | ORAL | Status: DC
  Administered 2023-12-11 – 2023-12-13 (×7): 60000 [IU] via ORAL
  Filled 2023-12-10 (×8): qty 2

## 2023-12-10 MED ORDER — EZETIMIBE 10 MG PO TABS
10.0000 mg | ORAL_TABLET | Freq: Every day | ORAL | Status: DC
  Administered 2023-12-11 – 2023-12-13 (×3): 10 mg via ORAL
  Filled 2023-12-10 (×3): qty 1

## 2023-12-10 MED ORDER — POTASSIUM CHLORIDE CRYS ER 20 MEQ PO TBCR
40.0000 meq | EXTENDED_RELEASE_TABLET | Freq: Two times a day (BID) | ORAL | Status: DC
  Administered 2023-12-10 – 2023-12-11 (×3): 40 meq via ORAL
  Filled 2023-12-10: qty 2
  Filled 2023-12-10: qty 4
  Filled 2023-12-10 (×2): qty 2

## 2023-12-10 MED ORDER — SPIRONOLACTONE 100 MG PO TABS: 100.0000 mg | ORAL_TABLET | Freq: Every day | ORAL | Status: DC

## 2023-12-10 MED ORDER — TRIPLE ANTIBIOTIC 3.5-400-5000 EX OINT
TOPICAL_OINTMENT | Freq: Once | CUTANEOUS | Status: AC
  Administered 2023-12-10: 1 via CUTANEOUS
  Filled 2023-12-10: qty 1

## 2023-12-10 MED ORDER — PROPRANOLOL HCL ER 80 MG PO CP24
80.0000 mg | ORAL_CAPSULE | Freq: Every day | ORAL | Status: DC
  Filled 2023-12-10: qty 1

## 2023-12-10 MED ORDER — ENOXAPARIN SODIUM 40 MG/0.4ML IJ SOSY: 40.0000 mg | PREFILLED_SYRINGE | INTRAMUSCULAR | Status: DC

## 2023-12-10 NOTE — Progress Notes (Signed)
 Complex Care Management Note Care Guide Note  12/10/2023 Name: Andrea Dickson MRN: [id number] DOB: Dec 12, 1950   Complex Care Management Outreach Attempts: A second unsuccessful outreach was attempted today to offer the patient with information about available complex care management services.  Follow Up Plan:  Additional outreach attempts will be made to offer the patient complex care management information and services.   Encounter Outcome:  No Answer-Left voicemail  Leotis Rase [facility], [facility] Guide  Direct Dial: [phone]  Fax [phone]

## 2023-12-10 NOTE — ED Triage Notes (Signed)
 Pt with recent surgery at [facility] for TIPS procedure with stents to liver, had heart failure due to son. Pt was not taking her lactulose  as directed, per son pt has been confused.

## 2023-12-10 NOTE — ED Notes (Addendum)
 SABRA

## 2023-12-10 NOTE — ED Provider Notes (Cosign Needed Addendum)
 [place] EMERGENCY DEPARTMENT AT [facility] Provider Note   CSN: [id number] Arrival date & time: 12/10/23  1630     Patient presents with: Altered Mental Status   Andrea Dickson is a 73 y.o. female with a history including type 2 diabetes, GERD, asthma, liver cirrhosis who underwent a TIPS procedure approximately 2 weeks ago presenting for altered mental status.  Son at the bedside states over the past 3 days she has had profoundly worsening confusion and he has discovered she may not have been compliant with her lactulose .  Patient lives by herself but she has children and a grandchild that stay with her throughout the day.  Son has noted confusion, slow thought and difficulty with memory.  She has had no fevers or chills, there has been noted increased jaundice as well.  No nausea or vomiting.  She denies any complaints of pain.  She was having to get frequent peritoneal centesis prior to the TIPS but has had no problems since having this placed.  She did have some CHF after the procedure but this seems to have resolved.  No diarrhea, no complaint of dysuria.   The history is provided by the patient and a relative (son at bedside).       Prior to Admission medications   Medication Sig Start Date End Date Taking? Authorizing Provider  acetaminophen  (TYLENOL ) 500 MG tablet Take 1,000 mg by mouth 2 (two) times daily as needed for moderate pain or headache.    [provider]  buPROPion  (WELLBUTRIN  XL) 150 MG 24 hr tablet Take 1 tablet (150 mg total) by mouth every morning. 11/03/23 11/02/24  Okey Barnie SAUNDERS, MD  cetirizine (ZYRTEC) 10 MG tablet Take 10 mg by mouth daily. Aller-Tec    [provider]  Cholecalciferol (VITAMIN D3) 50 MCG (2000 UT) TABS Take 2,000 Units by mouth daily.     [provider]  diazepam  (VALIUM ) 2 MG tablet Take 1 tablet (2 mg total) by mouth daily as needed for anxiety. 11/03/23 11/02/24  Okey Barnie SAUNDERS, MD  estrogens ,  conjugated, (PREMARIN ) 0.625 MG tablet Take 0.625 mg by mouth daily.    [provider]  ezetimibe  (ZETIA ) 10 MG tablet Take 10 mg by mouth at bedtime. 10/19/19   [provider]  fluticasone (FLONASE) 50 MCG/ACT nasal spray Place 2 sprays into both nostrils daily. 09/03/22   [provider]  furosemide  (LASIX ) 40 MG tablet Take 1 tablet (40 mg total) by mouth daily. 06/09/23 06/08/24  Kennedy Charmaine CROME, NP  hyoscyamine  (LEVSIN  SL) 0.125 MG SL tablet Place 1 tablet (0.125 mg total) under the tongue every 6 (six) hours as needed for cramping. 09/17/22   Shirlean Therisa ORN, NP  Lactobacillus-Inulin (CULTURELLE DIGESTIVE HEALTH) CAPS Take 1 capsule by mouth daily. 11/10/21   [provider]  lactulose  (CHRONULAC ) 10 GM/15ML solution Take 45 mLs (30 g total) by mouth in the morning and at bedtime. 12/03/23 06/30/24  Covington, Jamie R, NP  mirtazapine  (REMERON ) 15 MG tablet Take 1 tablet (15 mg total) by mouth at bedtime. 11/03/23   Okey Barnie SAUNDERS, MD  nitroGLYCERIN  (NITROSTAT ) 0.4 MG SL tablet Place 1 tablet (0.4 mg total) under the tongue every 5 (five) minutes as needed for chest pain. 12/07/23   Strader, Laymon HERO, PA-C  omeprazole  (PRILOSEC) 40 MG capsule TAKE ONE (1) CAPSULE BY MOUTH TWICE DAILY AT [facility] AND AT BEDTIME 07/22/23   Kennedy Charmaine CROME, NP  ondansetron  (ZOFRAN ) 4 MG tablet  TAKE 1 TABLET BY MOUTH EVERY 8 HOURS AS NEEDED FOR NAUSEA & VOMITING 12/30/22   Kennedy Charmaine CROME, NP  Pancrelipase , Lip-Prot-Amyl, (ZENPEP ) 60000-189600 units CPEP Take 1 capsule by mouth 3 (three) times daily with meals. 02/01/23   Kennedy Charmaine CROME, NP  propranolol  ER (INDERAL  LA) 80 MG 24 hr capsule Take 1 capsule (80 mg total) by mouth daily. 11/03/23 11/02/24  Okey Barnie SAUNDERS, MD  spironolactone  (ALDACTONE ) 100 MG tablet Take 1 tablet (100 mg total) by mouth daily. 06/09/23 06/08/24  Kennedy Charmaine CROME, NP  trimethoprim  (TRIMPEX ) 100 MG tablet TAKE 1 TABLET BY MOUTH EVERY DAY AT BEDTIME  06/29/23   Gerldine Lauraine BROCKS, FNP    Allergies: Pramipexole, Crestor [rosuvastatin calcium ], Bee pollen, and Pollen extract    Review of Systems  Constitutional:  Positive for fatigue. Negative for chills and fever.  HENT: Negative.    Eyes: Negative.   Respiratory:  Negative for shortness of breath.   Cardiovascular:  Negative for chest pain.  Gastrointestinal:  Negative for abdominal pain, nausea and vomiting.  Genitourinary: Negative.  Negative for dysuria.  Musculoskeletal:  Negative for arthralgias, joint swelling and neck pain.  Skin: Negative.  Negative for rash and wound.  Neurological:  Positive for weakness. Negative for dizziness, light-headedness, numbness and headaches.       Reports generalized weakness which is chronic.  Psychiatric/Behavioral:  Positive for confusion.     Updated Vital Signs BP 115/60   Pulse 72   Temp 98.3 F (36.8 C) (Oral)   Resp (!) 21   Ht 5' 6 (1.676 m)   Wt 66.2 kg   SpO2 100%   BMI 23.57 kg/m   Physical Exam Vitals and nursing note reviewed.  Constitutional:      General: She is not in acute distress.    Appearance: She is well-developed.  HENT:     Head: Normocephalic.     Comments: Small scalp hematoma left frontal scalp Eyes:     Conjunctiva/sclera: Conjunctivae normal.  Cardiovascular:     Rate and Rhythm: Normal rate and regular rhythm.     Heart sounds: Normal heart sounds.  Pulmonary:     Effort: Pulmonary effort is normal.     Breath sounds: Normal breath sounds. No wheezing.  Abdominal:     General: Abdomen is protuberant. Bowel sounds are normal.     Palpations: Abdomen is soft. There is no fluid wave.     Tenderness: There is no abdominal tenderness.  Musculoskeletal:        General: Normal range of motion.     Cervical back: Normal range of motion.  Skin:    General: Skin is warm and dry.     Coloration: Skin is jaundiced.     Comments: Abrasion left knee.  Neurological:     Mental Status: She is confused.      Cranial Nerves: No cranial nerve deficit.     Motor: Tremor present.     Gait: Gait abnormal.     Comments: Oriented to person and place, knew she was in [place], could not name this hospital.  Needed prompting with time.  Asterixis is present.  Unsteady gait.     (all labs ordered are listed, but only abnormal results are displayed) Labs Reviewed  CBC WITH DIFFERENTIAL/PLATELET - Abnormal; Notable for the following components:      Result Value   RBC 3.34 (*)    Hemoglobin 10.3 (*)    HCT 31.8 (*)  RDW 22.4 (*)    Platelets 100 (*)    All other components within normal limits  COMPREHENSIVE METABOLIC PANEL WITH GFR - Abnormal; Notable for the following components:   Potassium 3.3 (*)    CO2 20 (*)    Glucose, Bld 169 (*)    Creatinine, Ser 1.29 (*)    AST 43 (*)    Alkaline Phosphatase 198 (*)    Total Bilirubin 5.5 (*)    GFR, Estimated 44 (*)    All other components within normal limits  AMMONIA - Abnormal; Notable for the following components:   Ammonia 72 (*)    All other components within normal limits  LACTIC ACID, PLASMA - Abnormal; Notable for the following components:   Lactic Acid, Venous 3.8 (*)    All other components within normal limits  CBG MONITORING, ED - Abnormal; Notable for the following components:   Glucose-Capillary 154 (*)    All other components within normal limits  URINALYSIS, ROUTINE W REFLEX MICROSCOPIC    EKG: EKG Interpretation Date/Time:  Friday December 10 2023 17:21:04 EST Ventricular Rate:  72 PR Interval:  150 QRS Duration:  82 QT Interval:  423 QTC Calculation: 463 R Axis:   81  Text Interpretation: Sinus rhythm Borderline right axis deviation Low voltage, precordial leads Borderline T abnormalities, anterior leads Confirmed by Cleotilde Rogue ([id number]) on 12/10/2023 6:51:53 PM  Radiology: [facility] Chest Portable 1 View Result Date: 12/10/2023 CLINICAL DATA:  Fall EXAM: PORTABLE CHEST 1 VIEW COMPARISON:  Chest x-ray  11/21/2023 FINDINGS: The heart size and mediastinal contours are within normal limits. Both lungs are clear. The visualized skeletal structures are unremarkable. IMPRESSION: No active disease. Electronically Signed   By: Greig Pique M.D.   On: 12/10/2023 19:10   DG Knee Complete 4 Views Left Result Date: 12/10/2023 CLINICAL DATA:  Fall EXAM: LEFT KNEE - COMPLETE 4+ VIEW COMPARISON:  None Available. FINDINGS: No evidence of fracture, dislocation, or joint effusion. There are mild degenerative changes of the knee. There is medial and lateral compartment chondrocalcinosis. Soft tissues are unremarkable. IMPRESSION: 1. No acute fracture or dislocation. 2. Mild degenerative changes of the knee. Electronically Signed   By: Greig Pique M.D.   On: 12/10/2023 19:10   CT Head Wo Contrast Result Date: 12/10/2023 EXAM: CT HEAD WITHOUT CONTRAST 12/10/2023 06:40:46 PM TECHNIQUE: CT of the head was performed without the administration of intravenous contrast. Automated exposure control, iterative reconstruction, and/or weight based adjustment of the mA/kV was utilized to reduce the radiation dose to as low as reasonably achievable. COMPARISON: CT head 06/20/2022. CLINICAL HISTORY: Head trauma, minor (Age >= 65y). FINDINGS: BRAIN AND VENTRICLES: No acute hemorrhage. No evidence of acute infarct. No hydrocephalus. No extra-axial collection. No mass effect or midline shift. ORBITS: Bilateral lens replacements. SINUSES: No acute abnormality. SOFT TISSUES AND SKULL: Left frontal scalp hematoma. Similar small exostosis along the outer table of the right frontal calvarium likely reflecting a small osteoma. No skull fracture. IMPRESSION: 1. No acute intracranial abnormality. 2. Left frontal scalp hematoma. Electronically signed by: Donnice Mania MD 12/10/2023 06:54 PM EST RP Workstation: [id number]     Procedures   Medications Ordered in the ED  ibuprofen  (ADVIL ) tablet 400 mg (400 mg Oral Patient Refused/Not Given  12/10/23 2014)  neomycin -bacitracin -polymyxin [phone] [id number] OINT (1 Application Apply externally Given 12/10/23 2045)  lactulose  (CHRONULAC ) 10 GM/15ML solution 45 g (45 g Oral Given 12/10/23 2045)  Medical Decision Making Patient with liver cirrhosis, recent TIPS placement, has had a profound change in alertness and confusion over the past 3 days, son at bedside has discovered she has not been compliant with her lactulose .  Differential including hepatic encephalopathy, electrolyte abnormality, UTI, trauma/head injury.  History and today's exam strongly favor hepatic encephalopathy.  No fevers or chills.  No vomiting, diarrhea or complaint of pain.  No headaches, she does endorse falling earlier this morning striking her left knee.  Unfortunately while she was here in the bathroom before my exam she fell off of the commode, was found on her hands and knees, she had complaint of striking her head on the floor.  CT imaging was completed, although it was already planned given her presentation of confusion, negative for acute injury.  Her left knee was also imaged, although she had no complaint of pain there was a small bleeding abrasion on her left knee after she was returned from the bathroom.  This images also negative for acute findings.  Amount and/or Complexity of Data Reviewed Labs: ordered.    Details: Labs reviewed, she ammonia level is elevated at 72, her potassium is slightly reduced at 3.3, she has a creatinine of 1.29 although normal BUN at 13.  Her bilirubin is elevated at 5.5 and her alk phos is 198.  She has a normal WBC count, her hemoglobin is 10.3.  Her lactic acid is 3.8.  This does not represent sepsis, lactic acid is elevated secondary to liver failure. Radiology: ordered.    Details: CT and plain film imaging were reviewed, CT head is negative for acute intracranial injury.  Left knee negative for fracture. Discussion of management or test  interpretation with external provider(s): Patient discussed with Dr. Barbra who accepts patient for admission  Risk OTC drugs. Prescription drug management. Decision regarding hospitalization. Diagnosis or treatment significantly limited by social determinants of health.        Final diagnoses:  Hepatic encephalopathy [facility])    ED Discharge Orders     None          Birdena Mliss RIGGERS 12/10/23 2041    Birdena Mliss, PA-C 12/10/23 2107    Cleotilde Rogue, MD 12/11/23 2258

## 2023-12-10 NOTE — H&P (Signed)
 History and Physical    Patient: Andrea Dickson FMW:[id number] DOB: 1950-08-30 DOA: 12/10/2023 DOS: the patient was seen and examined on 12/10/2023 PCP: Lari Elspeth BRAVO, MD  Patient coming from: Home  Chief Complaint:  Chief Complaint  Patient presents with   Altered Mental Status   HPI: Andrea Dickson is a 73 y.o. female with medical history significant of hepatic cirrhosis presumed secondary to NASH, diabetes, GERD, bipolar.  Patient had a TIPS procedure done on 11/7.  She was observed overnight and discharged home.  Following discharge, she stopped taking her lactulose .  Over the last 3 days she has been increasingly confused with disequilibrium.  Her son discovered that she was not taking her lactulose  and started her yesterday and this morning, however she continued to be confused and was without improvement.  Her son brought her to the hospital for evaluation.  Denies fevers, chills, nausea, vomiting.  Her ammonia level was 72 and a lactic acid of 3.8.  While here, she did have a fall in the bathroom and hit her head. CT scan was done and was without acute intercranial process.  Review of Systems: As mentioned in the history of present illness. All other systems reviewed and are negative. Past Medical History:  Diagnosis Date   Anxiety    Asthma    Bipolar 1 disorder (HCC)    Chronic diarrhea    Cirrhosis (HCC)    Diagnosed September 2020, likely secondary to [facility], s/p Hep A/B vaccination in 2021.    Depression    Diabetes (HCC)    Essential tremor    GERD (gastroesophageal reflux disease)    Headache    High cholesterol    Past Surgical History:  Procedure Laterality Date   ABDOMINAL HYSTERECTOMY  1997   BIOPSY  01/17/2018   Procedure: BIOPSY;  Surgeon: Shaaron Lamar HERO, MD;  [facility];  Service: Endoscopy;;  colon   carpal tunnel Bilateral 1999, 06/2009   COLONOSCOPY  2013   Dr. Donnel: no colon polyps. quality of prep was fair. repeat colonoscopy  in five years.    COLONOSCOPY WITH PROPOFOL  N/A 01/17/2018   Dr. Shaaron: Colonic lipoma, 2 tubular adenomas removed.  Random colon biopsies negative.  Next colonoscopy 5 years.   COLONOSCOPY WITH PROPOFOL  N/A 09/26/2022   Procedure: COLONOSCOPY WITH PROPOFOL ;  Surgeon: Eartha Angelia Sieving, MD;  [facility]: [facility];  Service: Gastroenterology;  Laterality: N/A;   ESOPHAGOGASTRODUODENOSCOPY N/A 04/19/2023   Procedure: EGD (ESOPHAGOGASTRODUODENOSCOPY);  Surgeon: Cindie Carlin POUR, DO;  [facility];  Service: Endoscopy;  Laterality: N/A;  1:45 pm, asa 3   ESOPHAGOGASTRODUODENOSCOPY (EGD) WITH PROPOFOL  N/A 04/06/2019   Procedure: ESOPHAGOGASTRODUODENOSCOPY (EGD) WITH PROPOFOL ;  Surgeon: Shaaron Lamar HERO, MD;   Normal esophagus, mild portal hypertensive gastropathy, otherwise normal exam.  Recommend repeat EGD in 2 years for variceal screening.    ESOPHAGOGASTRODUODENOSCOPY (EGD) WITH PROPOFOL  N/A 01/19/2022   Procedure: ESOPHAGOGASTRODUODENOSCOPY (EGD) WITH PROPOFOL ;  Surgeon: Shaaron Lamar HERO, MD;  [facility];  Service: Endoscopy;  Laterality: N/A;  12:45 pm, pt knows to arrive at 9:15   ESOPHAGOGASTRODUODENOSCOPY (EGD) WITH PROPOFOL  N/A 09/25/2022   Procedure: ESOPHAGOGASTRODUODENOSCOPY (EGD) WITH PROPOFOL ;  Surgeon: Eartha Angelia Sieving, MD;  [facility];  Service: Gastroenterology;  Laterality: N/A;   GIVENS CAPSULE STUDY N/A 06/16/2023   Procedure: IMAGING PROCEDURE, GI TRACT, INTRALUMINAL, VIA CAPSULE;  Surgeon: Cindie Carlin POUR, DO;  [facility]: [facility];  Service: Endoscopy;  Laterality: N/A;  730am   IR INTRAVASCULAR ULTRASOUND NON CORONARY  11/19/2023   IR PARACENTESIS  11/19/2023   IR RADIOLOGIST EVAL & MGMT  04/28/2023   IR TIPS  11/19/2023   IR US  GUIDE VASC ACCESS RIGHT  11/19/2023   IR US  GUIDE VASC ACCESS RIGHT  11/19/2023   POLYPECTOMY  01/17/2018   Procedure: POLYPECTOMY;  Surgeon: Shaaron Lamar HERO, MD;  [facility];  Service:  Endoscopy;;  colon   POLYPECTOMY  09/26/2022   Procedure: POLYPECTOMY;  Surgeon: Eartha Angelia Sieving, MD;  [facility];  Service: Gastroenterology;;   SKIN CANCER EXCISION  2007   TIPS PROCEDURE N/A 11/19/2023   Procedure: INSERTION, SHUNT, INTRAHEPATIC PORTOSYSTEMIC, TRANSJUGULAR;  Surgeon: Philip Cornet, MD;  Location: [facility];  Service: Radiology;  Laterality: N/A;   TONSILLECTOMY  1965   ulner nerve  Left 06/2009   decompression   Social History:  reports that she quit smoking about 22 months ago. Her smoking use included cigarettes. She has a 21.5 pack-year smoking history. She has never used smokeless tobacco. She reports that she does not drink alcohol and does not use drugs.  Allergies  Allergen Reactions   Pramipexole Other (See Comments)    Shaking, palpitations, headache, faint feeling   Crestor [Rosuvastatin Calcium ]     Muscle pain   Bee Pollen Other (See Comments)    Eyes and nose run   Pollen Extract Other (See Comments)    Eyes and nose run    Family History  Problem Relation Age of Onset   Diabetes Mother    Brain cancer Mother    Diabetes Father    Heart attack Father    Heart disease Father    Diabetes Sister    Bipolar disorder Brother    Diabetes Brother    Heart disease Brother    Colon cancer Neg Hx    Breast cancer Neg Hx     Prior to Admission medications   Medication Sig Start Date End Date Taking? Authorizing Provider  acetaminophen  (TYLENOL ) 500 MG tablet Take 1,000 mg by mouth 2 (two) times daily as needed for moderate pain or headache.    [provider]  buPROPion  (WELLBUTRIN  XL) 150 MG 24 hr tablet Take 1 tablet (150 mg total) by mouth every morning. 11/03/23 11/02/24  Okey Barnie SAUNDERS, MD  cetirizine (ZYRTEC) 10 MG tablet Take 10 mg by mouth daily. Aller-Tec    [provider]  Cholecalciferol (VITAMIN D3) 50 MCG (2000 UT) TABS Take 2,000 Units by mouth daily.     [provider]  diazepam  (VALIUM ) 2 MG  tablet Take 1 tablet (2 mg total) by mouth daily as needed for anxiety. 11/03/23 11/02/24  Okey Barnie SAUNDERS, MD  estrogens , conjugated, (PREMARIN ) 0.625 MG tablet Take 0.625 mg by mouth daily.    [provider]  ezetimibe  (ZETIA ) 10 MG tablet Take 10 mg by mouth at bedtime. 10/19/19   [provider]  fluticasone (FLONASE) 50 MCG/ACT nasal spray Place 2 sprays into both nostrils daily. 09/03/22   [provider]  furosemide  (LASIX ) 40 MG tablet Take 1 tablet (40 mg total) by mouth daily. 06/09/23 06/08/24  Kennedy Charmaine CROME, NP  hyoscyamine  (LEVSIN  SL) 0.125 MG SL tablet Place 1 tablet (0.125 mg total) under the tongue every 6 (six) hours as needed for cramping. 09/17/22   Shirlean Therisa ORN, NP  Lactobacillus-Inulin (CULTURELLE DIGESTIVE HEALTH) CAPS Take 1 capsule by mouth daily. 11/10/21   [provider]  lactulose  (CHRONULAC ) 10 GM/15ML solution Take 45 mLs (30 g total) by mouth in the morning and at bedtime. 12/03/23 06/30/24  Covington, Jamie R, NP  mirtazapine  (REMERON ) 15 MG tablet Take 1 tablet (15 mg total) by mouth at bedtime. 11/03/23   Okey Barnie SAUNDERS, MD  nitroGLYCERIN  (NITROSTAT ) 0.4 MG SL tablet Place 1 tablet (0.4 mg total) under the tongue every 5 (five) minutes as needed for chest pain. 12/07/23   Strader, Laymon HERO, PA-C  omeprazole  (PRILOSEC) 40 MG capsule TAKE ONE (1) CAPSULE BY MOUTH TWICE DAILY AT [facility] AND AT BEDTIME 07/22/23   Mahon, Charmaine CROME, NP  ondansetron  (ZOFRAN ) 4 MG tablet TAKE 1 TABLET BY MOUTH EVERY 8 HOURS AS NEEDED FOR NAUSEA & VOMITING 12/30/22   Kennedy Charmaine CROME, NP  Pancrelipase , Lip-Prot-Amyl, (ZENPEP ) 60000-189600 units CPEP Take 1 capsule by mouth 3 (three) times daily with meals. 02/01/23   Kennedy Charmaine CROME, NP  propranolol  ER (INDERAL  LA) 80 MG 24 hr capsule Take 1 capsule (80 mg total) by mouth daily. 11/03/23 11/02/24  Okey Barnie SAUNDERS, MD  spironolactone  (ALDACTONE ) 100 MG tablet Take 1 tablet (100 mg total) by mouth daily.  06/09/23 06/08/24  Kennedy Charmaine CROME, NP  trimethoprim  (TRIMPEX ) 100 MG tablet TAKE 1 TABLET BY MOUTH EVERY DAY AT BEDTIME 06/29/23   Gerldine Lauraine BROCKS, FNP    Physical Exam: Vitals:   12/10/23 1945 12/10/23 2000 12/10/23 2109 12/10/23 2115  BP: 118/66 115/60  (!) 111/47  Pulse: 71 72  72  Resp: 17 (!) 21  (!) 21  Temp:   98.1 F (36.7 C)   TempSrc:   Oral   SpO2: 100% 100%  100%  Weight:      Height:       General: elderly female. Awake and alert and oriented x3. No acute cardiopulmonary distress.  HEENT: Normocephalic atraumatic.  Right and left ears normal in appearance.  Pupils equal, round, reactive to light. Extraocular muscles are intact. Sclerae anicteric and noninjected.  Moist mucosal membranes. No mucosal lesions.  Neck: Neck supple without lymphadenopathy. No carotid bruits. No masses palpated.  Cardiovascular: Regular rate with normal S1-S2 sounds. No murmurs, rubs, gallops auscultated. No JVD.  Respiratory: Good respiratory effort with no wheezes, rales, rhonchi. Lungs clear to auscultation bilaterally.  No accessory muscle use. Abdomen: Soft, nontender, nondistended. Active bowel sounds. No masses or hepatosplenomegaly  Skin: No rashes, lesions, or ulcerations.  Dry, warm to touch. 2+ dorsalis pedis and radial pulses. Musculoskeletal: No calf or leg pain. All major joints not erythematous nontender.  No upper or lower joint deformation.  Good ROM.  No contractures  Psychiatric: Intact judgment and insight. Pleasant and cooperative. Neurologic: No focal neurological deficits. Strength is 5/5 and symmetric in upper and lower extremities.  Asterixis present cranial nerves II through XII are grossly intact.  Data Reviewed: All labs and images reviewed by me  Assessment and Plan: No notes have been filed under this hospital service. Service: Hospitalist  Principal Problem:   Acute hepatic encephalopathy (HCC) Active Problems:   Hepatic cirrhosis (HCC)   S/P TIPS  (transjugular intrahepatic portosystemic shunt)   Type 2 diabetes mellitus with complication, without long-term current use of insulin (HCC)   Lactic acidosis  Acute hepatic encephalopathy secondary to hepatic cirrhosis Lactulose  3 times daily Check ammonia level in the morning Follow lactic acid level Status post TIPS Diet 2 diabetes Sliding scale Lactic acidosis   Advance Care Planning:   Code Status: Limited: Do not attempt resuscitation (  DNR) -DNR-LIMITED -Do Not Intubate/DNI confirmed by patient  Consults: .  None  Family Communication: None  Severity of Illness: The appropriate patient status for this patient is INPATIENT. Inpatient status is judged to be reasonable and necessary in order to provide the required intensity of service to ensure the patient's safety. The patient's presenting symptoms, physical exam findings, and initial radiographic and laboratory data in the context of their chronic comorbidities is felt to place them at high risk for further clinical deterioration. Furthermore, it is not anticipated that the patient will be medically stable for discharge from the hospital within 2 midnights of admission.   * I certify that at the point of admission it is my clinical judgment that the patient will require inpatient hospital care spanning beyond 2 midnights from the point of admission due to high intensity of service, high risk for further deterioration and high frequency of surveillance required.*  Author: Placida Cambre J Logen Heintzelman, DO 12/10/2023 9:21 PM  For on call review [email or web address].

## 2023-12-10 NOTE — Plan of Care (Signed)

## 2023-12-10 NOTE — ED Notes (Addendum)
 Pt assisted to bathroom w/c without any assist. Pt used restroom and was back in w/c without assist. Pt skin appears yellow, pt is alert with signs of confusion. Pt stated I was doing a great job at that butter.

## 2023-12-10 NOTE — ED Notes (Addendum)
 Assisted pt to bathroom via w/c. Helped pt sit on toilet and advised her to pull string when she is ready to get back into w/c. Heard loud noise in bathroom and pt was on knees with head on floor, alert. Pt stated I don't know how I fell. After quick assessment pt was assisted to w/c and taken back to room with PA at bedside who did an assessment.  Knot noted to top of front left head.fall risk arm band, socks and bed alarm now present.

## 2023-12-11 DIAGNOSIS — N1831 Chronic kidney disease, stage 3a: Secondary | ICD-10-CM | POA: Insufficient documentation

## 2023-12-11 DIAGNOSIS — K7469 Other cirrhosis of liver: Secondary | ICD-10-CM

## 2023-12-11 DIAGNOSIS — K7682 Hepatic encephalopathy: Secondary | ICD-10-CM | POA: Diagnosis not present

## 2023-12-11 DIAGNOSIS — I5032 Chronic diastolic (congestive) heart failure: Secondary | ICD-10-CM | POA: Diagnosis not present

## 2023-12-11 LAB — TSH: TSH: 0.978 u[IU]/mL (ref 0.350–4.500)

## 2023-12-11 LAB — GLUCOSE, CAPILLARY
Glucose-Capillary: 120 mg/dL — ABNORMAL HIGH (ref 70–99)
Glucose-Capillary: 224 mg/dL — ABNORMAL HIGH (ref 70–99)
Glucose-Capillary: 200 mg/dL — ABNORMAL HIGH (ref 70–99)
Glucose-Capillary: 171 mg/dL — ABNORMAL HIGH (ref 70–99)

## 2023-12-11 LAB — LACTIC ACID, PLASMA
Lactic Acid, Venous: 2.2 mmol/L (ref 0.5–1.9)
Lactic Acid, Venous: 2 mmol/L (ref 0.5–1.9)
Lactic Acid, Venous: 2.1 mmol/L (ref 0.5–1.9)
Lactic Acid, Venous: 2.7 mmol/L (ref 0.5–1.9)

## 2023-12-11 LAB — COMPREHENSIVE METABOLIC PANEL WITH GFR
ALT: 13 U/L (ref 0–44)
AST: 33 U/L (ref 15–41)
Albumin: 3.4 g/dL — ABNORMAL LOW (ref 3.5–5.0)
Alkaline Phosphatase: 148 U/L — ABNORMAL HIGH (ref 38–126)
Anion gap: 11 (ref 5–15)
BUN: 11 mg/dL (ref 8–23)
CO2: 19 mmol/L — ABNORMAL LOW (ref 22–32)
Calcium: 8.9 mg/dL (ref 8.9–10.3)
Chloride: 114 mmol/L — ABNORMAL HIGH (ref 98–111)
Creatinine, Ser: 1.02 mg/dL — ABNORMAL HIGH (ref 0.44–1.00)
GFR, Estimated: 58 mL/min — ABNORMAL LOW (ref >60)
Glucose, Bld: 144 mg/dL — ABNORMAL HIGH (ref 70–99)
Potassium: 3.8 mmol/L (ref 3.5–5.1)
Sodium: 144 mmol/L (ref 135–145)
Total Bilirubin: 4.6 mg/dL — ABNORMAL HIGH (ref 0.0–1.2)
Total Protein: 6 g/dL — ABNORMAL LOW (ref 6.5–8.1)

## 2023-12-11 LAB — FOLATE: Folate: 9.9 ng/mL (ref >5.9)

## 2023-12-11 LAB — MAGNESIUM: Magnesium: 2 mg/dL (ref 1.7–2.4)

## 2023-12-11 LAB — VITAMIN B12: Vitamin B-12: 665 pg/mL (ref 180–914)

## 2023-12-11 LAB — AMMONIA: Ammonia: 50 umol/L — ABNORMAL HIGH (ref 9–35)

## 2023-12-11 LAB — T4, FREE: Free T4: 1.33 ng/dL — ABNORMAL HIGH (ref 0.61–1.12)

## 2023-12-11 MED ORDER — LACTATED RINGERS IV SOLN: INTRAVENOUS | Status: AC

## 2023-12-11 MED ORDER — INSULIN ASPART 100 UNIT/ML IJ SOLN
0.0000 [IU] | Freq: Three times a day (TID) | INTRAMUSCULAR | Status: DC
  Administered 2023-12-11: 1 [IU] via SUBCUTANEOUS
  Administered 2023-12-11 – 2023-12-13 (×3): 2 [IU] via SUBCUTANEOUS
  Filled 2023-12-11 (×4): qty 1

## 2023-12-11 MED ORDER — PROPRANOLOL HCL 20 MG PO TABS
10.0000 mg | ORAL_TABLET | Freq: Two times a day (BID) | ORAL | Status: DC
  Administered 2023-12-11 – 2023-12-13 (×5): 10 mg via ORAL
  Filled 2023-12-11 (×5): qty 1

## 2023-12-11 MED ORDER — LACTATED RINGERS IV BOLUS
1000.0000 mL | Freq: Once | INTRAVENOUS | Status: AC
  Administered 2023-12-11: 1000 mL via INTRAVENOUS

## 2023-12-11 NOTE — Hospital Course (Addendum)
 73 year old female with a history of NASH cirrhosis, former smoker, diabetes mellitus type 2, hypertension, hyperlipidemia, bipolar disorder presenting with altered mental status.  The patient had a recent hospitalization from 11/19/2023 to 11/23/2023 when she was admitted for TIPS procedure which she underwent on 11/19/2023.  She subsequently developed fluid overload which was felt to be secondary decompensated liver cirrhosis and acute HFpEF.  She was diuresed and transition to furosemide  40 mg daily and spironolactone  100 mg daily.  The patient developed chest pain which was felt to be MSK etiology.  Echocardiogram showed EF 70 to 75% with no WMA.  She was discharged home with home health physical therapy.  After discharge from the hospital on 11/23/2023, the patient stopped taking her lactulose .  The patient subsequently developed increasing confusion for the past 3 days.  The patient's son discovered that the patient had not been taking her lactulose  and restarted it on the day prior to admission.  However the patient remained confused.  As result the patient was brought to the hospital for further evaluation treatment. Patient denies fevers, chills, headache, chest pain, dyspnea, nausea, vomiting, diarrhea, abdominal pain, dysuria, hematuria, hematochezia, and melena.   In the ED, the patient was afebrile and hemodynamically stable with oxygen saturation 100% room air. WBC 4.2, hemoglobin 10.2, platelets 100.  Sodium 141, potassium 3.3, bicarbonate 20, serum creatinine 1.29.  Ammonia 72.  The patient was started on lactulose .

## 2023-12-11 NOTE — Progress Notes (Addendum)
 PROGRESS NOTE  Andrea Dickson FMW:[id number] DOB: June 30, 1950 DOA: 12/10/2023 PCP: Lari Elspeth BRAVO, MD  Brief History:  73 year old female with a history of NASH cirrhosis, former smoker, diabetes mellitus type 2, hypertension, hyperlipidemia, bipolar disorder presenting with altered mental status.  The patient had a recent hospitalization from 11/19/2023 to 11/23/2023 when she was admitted for TIPS procedure which she underwent on 11/19/2023.  She subsequently developed fluid overload which was felt to be secondary decompensated liver cirrhosis and acute HFpEF.  She was diuresed and transition to furosemide  40 mg daily and spironolactone  100 mg daily.  The patient developed chest pain which was felt to be MSK etiology.  Echocardiogram showed EF 70 to 75% with no WMA.  She was discharged home with home health physical therapy.  After discharge from the hospital on 11/23/2023, the patient stopped taking her lactulose .  The patient subsequently developed increasing confusion for the past 3 days.  The patient's son discovered that the patient had not been taking her lactulose  and restarted it on the day prior to admission.  However the patient remained confused.  As result the patient was brought to the hospital for further evaluation treatment. Patient denies fevers, chills, headache, chest pain, dyspnea, nausea, vomiting, diarrhea, abdominal pain, dysuria, hematuria, hematochezia, and melena.   In the ED, the patient was afebrile and hemodynamically stable with oxygen saturation 100% room air. WBC 4.2, hemoglobin 10.2, platelets 100.  Sodium 141, potassium 3.3, bicarbonate 20, serum creatinine 1.29.  Ammonia 72.  The patient was started on lactulose .   Assessment/Plan: Hepatic encephalopathy - Presented with ammonia 72 and altered mental status - 12/11/2023--patient remains somnolent, but follows commands and speaks fluently--drifts back to sleep if not stimulated -Continues to have  asterixis on exam - Continue lactulose  30 g 3 times daily - UA negative for pyuria - CT brain negative - B12 - TSH - Folate  NASH Liver Cirrohsis, decompensated -hold lasix  and spironolactone  temporarily with lactic acidosis -Status post TIPS 11/19/2023 - Monitor BMP  Lactic acidosis -Likely secondary to delayed clearance from hepatic dysfunction - Blood cultures x 2 sets - UA negative for pyuria - Personally reviewed chest x-ray--no infiltrates or edema  CKD stage IIIa - Baseline creatinine 1.0-1.2 - Monitor with diuretics  Bipolar disorder - Continue Wellbutrin  - Holding Remeron  temporarily  Mixed hyperlipidemia - Continue Zetia   Diabetes mellitus type 2 - Not on any agents in the outpatient setting - Check hemoglobin A1c  Hypokalemia -replete -check mag  Chronic HFpEF -compensated - 11/21/2023 echo EF 70 to 75%, no WMA, normal RV       Family Communication:  son 11/29  Consultants:  none  Code Status:  DNR  DVT Prophylaxis:  SCDs   Procedures: As Listed in Progress Note Above  Antibiotics: None      Subjective: Drifts back to sleep if not stimulated.  Patient denies fevers, chills, headache, chest pain, dyspnea, nausea, vomiting, diarrhea, abdominal pain, dysuria, hematuria, hematochezia, and melena.   Objective: Vitals:   12/10/23 2201 12/10/23 2221 12/11/23 0221 12/11/23 0557  BP: (!) 108/53  (!) 108/42 (!) 100/44  Pulse: 70  71 71  Resp: 17  17 18   Temp: 99 F (37.2 C)  99.1 F (37.3 C) 98.2 F (36.8 C)  TempSrc: Oral  Oral   SpO2: 100% 100% 100% 97%  Weight:      Height:       No intake or output data in  the 24 hours ending 12/11/23 0657 Weight change:  Exam:  General:  Pt is alert, follows commands appropriately, not in acute distress HEENT: No icterus, No thrush, No neck mass, [place]/AT Cardiovascular: RRR, S1/S2, no rubs, no gallops Respiratory: CTA bilaterally, no wheezing, no crackles, no rhonchi Abdomen: Soft/+BS, non  tender, non distended, no guarding Extremities: trace LE edema, No lymphangitis, No petechiae, No rashes, no synovitis   Data Reviewed: I have personally reviewed following labs and imaging studies Basic Metabolic Panel: Recent Labs  Lab 12/10/23 1821 12/11/23 0311  NA 141 144  K 3.3* 3.8  CL 106 114*  CO2 20* 19*  GLUCOSE 169* 144*  BUN 13 11  CREATININE 1.29* 1.02*  CALCIUM  9.4 8.9   Liver Function Tests: Recent Labs  Lab 12/10/23 1821 12/11/23 0311  AST 43* 33  ALT 19 13  ALKPHOS 198* 148*  BILITOT 5.5* 4.6*  PROT 7.7 6.0*  ALBUMIN  4.4 3.4*   No results for input(s): LIPASE, AMYLASE in the last 168 hours. Recent Labs  Lab 12/10/23 1857 12/11/23 0311  AMMONIA 72* 50*   Coagulation Profile: No results for input(s): INR, PROTIME in the last 168 hours. CBC: Recent Labs  Lab 12/10/23 1821  WBC 4.2  NEUTROABS 3.0  HGB 10.3*  HCT 31.8*  MCV 95.2  PLT 100*   Cardiac Enzymes: No results for input(s): CKTOTAL, CKMB, CKMBINDEX, TROPONINI in the last 168 hours. BNP: Invalid input(s): POCBNP CBG: Recent Labs  Lab 12/10/23 1911  GLUCAP 154*   HbA1C: No results for input(s): HGBA1C in the last 72 hours. Urine analysis:    Component Value Date/Time   COLORURINE YELLOW 12/10/2023 1814   APPEARANCEUR CLEAR 12/10/2023 1814   APPEARANCEUR Clear 01/14/2023 1045   LABSPEC 1.009 12/10/2023 1814   PHURINE 5.0 12/10/2023 1814   GLUCOSEU NEGATIVE 12/10/2023 1814   HGBUR NEGATIVE 12/10/2023 1814   BILIRUBINUR NEGATIVE 12/10/2023 1814   BILIRUBINUR Negative 01/14/2023 1045   KETONESUR NEGATIVE 12/10/2023 1814   PROTEINUR NEGATIVE 12/10/2023 1814   UROBILINOGEN 1.0 10/08/2015 1347   NITRITE NEGATIVE 12/10/2023 1814   LEUKOCYTESUR NEGATIVE 12/10/2023 1814   Sepsis Labs: @LABRCNTIP (procalcitonin:4,lacticidven:4) )No results found for this or any previous visit (from the past 240 hours).   Scheduled Meds:  buPROPion   150 mg Oral Daily    enoxaparin  (LOVENOX ) injection  40 mg Subcutaneous Q24H   ezetimibe   10 mg Oral Daily   furosemide   40 mg Oral Daily   ibuprofen   400 mg Oral Once   insulin  aspart  0-6 Units Subcutaneous TID WC   lactulose   30 g Oral TID   lipase/protease/amylase  60,000 Units Oral TID WC   mirtazapine   15 mg Oral QHS   pantoprazole   40 mg Oral Daily   potassium chloride   40 mEq Oral BID   propranolol  ER  80 mg Oral Daily   spironolactone   100 mg Oral Daily   trimethoprim   100 mg Oral QHS   Continuous Infusions:  Procedures/Studies: DG Chest Portable 1 View Result Date: 12/10/2023 CLINICAL DATA:  Fall EXAM: PORTABLE CHEST 1 VIEW COMPARISON:  Chest x-ray 11/21/2023 FINDINGS: The heart size and mediastinal contours are within normal limits. Both lungs are clear. The visualized skeletal structures are unremarkable. IMPRESSION: No active disease. Electronically Signed   By: Greig Pique M.D.   On: 12/10/2023 19:10   DG Knee Complete 4 Views Left Result Date: 12/10/2023 CLINICAL DATA:  Fall EXAM: LEFT KNEE - COMPLETE 4+ VIEW COMPARISON:  None Available. FINDINGS: No  evidence of fracture, dislocation, or joint effusion. There are mild degenerative changes of the knee. There is medial and lateral compartment chondrocalcinosis. Soft tissues are unremarkable. IMPRESSION: 1. No acute fracture or dislocation. 2. Mild degenerative changes of the knee. Electronically Signed   By: Greig Pique M.D.   On: 12/10/2023 19:10   CT Head Wo Contrast Result Date: 12/10/2023 EXAM: CT HEAD WITHOUT CONTRAST 12/10/2023 06:40:46 PM TECHNIQUE: CT of the head was performed without the administration of intravenous contrast. Automated exposure control, iterative reconstruction, and/or weight based adjustment of the mA/kV was utilized to reduce the radiation dose to as low as reasonably achievable. COMPARISON: CT head 06/20/2022. CLINICAL HISTORY: Head trauma, minor (Age >= 65y). FINDINGS: BRAIN AND VENTRICLES: No acute hemorrhage.  No evidence of acute infarct. No hydrocephalus. No extra-axial collection. No mass effect or midline shift. ORBITS: Bilateral lens replacements. SINUSES: No acute abnormality. SOFT TISSUES AND SKULL: Left frontal scalp hematoma. Similar small exostosis along the outer table of the right frontal calvarium likely reflecting a small osteoma. No skull fracture. IMPRESSION: 1. No acute intracranial abnormality. 2. Left frontal scalp hematoma. Electronically signed by: Donnice Mania MD 12/10/2023 06:54 PM EST RP Workstation: [id number]   ECHOCARDIOGRAM COMPLETE Result Date: 11/21/2023    ECHOCARDIOGRAM REPORT   Patient Name:   CANAAN PRUE Date of Exam: 11/21/2023 Medical Rec #:  [id number]          Height:       66.0 in Accession #:    [id number]         Weight:       155.0 lb Date of Birth:  Dec 11, 1950          BSA:          1.794 m Patient Age:    73 years           BP:           115/51 mmHg Patient Gender: F                  HR:           83 bpm. Exam Location:  Inpatient Procedure: 2D Echo, Color Doppler and Cardiac Doppler (Both Spectral and Color            Flow Doppler were utilized during procedure). Indications:    Dyspnea R06.00  History:        Patient has prior history of Echocardiogram examinations, most                 recent 08/31/2023. Risk Factors:Dyslipidemia and Diabetes.  Sonographer:    Tinnie Gosling RDCS Referring Phys: [id number] DORN FALCON BRANCH IMPRESSIONS  1. Left ventricular ejection fraction, by estimation, is 70 to 75%. The left ventricle has hyperdynamic function. The left ventricle has no regional wall motion abnormalities. Left ventricular diastolic parameters are indeterminate.  2. Right ventricular systolic function is normal. The right ventricular size is normal. Tricuspid regurgitation signal is inadequate for assessing PA pressure.  3. The mitral valve is normal in structure. No evidence of mitral valve regurgitation. No evidence of mitral stenosis.  4. The tricuspid valve is  abnormal.  5. The aortic valve is tricuspid. Aortic valve regurgitation is not visualized. No aortic stenosis is present.  6. The inferior vena cava is dilated in size with <50% respiratory variability, suggesting right atrial pressure of 15 mmHg. FINDINGS  Left Ventricle: Hyperdynamic LV function creates mild intracavitary gradient, peak 17 mmHg. Left ventricular ejection fraction, by  estimation, is 70 to 75%. The left ventricle has hyperdynamic function. The left ventricle has no regional wall motion abnormalities. The left ventricular internal cavity size was normal in size. There is no left ventricular hypertrophy. Left ventricular diastolic parameters are indeterminate. Right Ventricle: The right ventricular size is normal. Right vetricular wall thickness was not well visualized. Right ventricular systolic function is normal. Tricuspid regurgitation signal is inadequate for assessing PA pressure. Left Atrium: Left atrial size was normal in size. Right Atrium: Right atrial size was normal in size. Pericardium: There is no evidence of pericardial effusion. Mitral Valve: The mitral valve is normal in structure. No evidence of mitral valve regurgitation. No evidence of mitral valve stenosis. Tricuspid Valve: The tricuspid valve is abnormal. Tricuspid valve regurgitation is mild . No evidence of tricuspid stenosis. Aortic Valve: The aortic valve is tricuspid. Aortic valve regurgitation is not visualized. No aortic stenosis is present. Aortic valve mean gradient measures 6.1 mmHg. Aortic valve peak gradient measures 13.7 mmHg. Aortic valve area, by VTI measures 2.74  cm. Pulmonic Valve: The pulmonic valve was not well visualized. Pulmonic valve regurgitation is not visualized. No evidence of pulmonic stenosis. Aorta: The aortic root and ascending aorta are structurally normal, with no evidence of dilitation. Venous: The inferior vena cava is dilated in size with less than 50% respiratory variability, suggesting  right atrial pressure of 15 mmHg. IAS/Shunts: No atrial level shunt detected by color flow Doppler.  LEFT VENTRICLE PLAX 2D LVIDd:         3.90 cm   Diastology LVIDs:         2.30 cm   LV e' medial:    7.72 cm/s LV PW:         1.10 cm   LV E/e' medial:  14.8 LV IVS:        0.90 cm   LV e' lateral:   12.40 cm/s LVOT diam:     1.80 cm   LV E/e' lateral: 9.2 LV SV:         73 LV SV Index:   40 LVOT Area:     2.54 cm LV IVRT:       85 msec  RIGHT VENTRICLE             IVC RV S prime:     13.60 cm/s  IVC diam: 2.60 cm TAPSE (M-mode): 2.2 cm                             PULMONARY VEINS                             Diastolic Velocity: 86.20 cm/s                             S/D Velocity:       0.90                             Systolic Velocity:  80.70 cm/s LEFT ATRIUM             Index        RIGHT ATRIUM           Index LA diam:        3.80 cm 2.12 cm/m   RA Area:     10.20 cm LA  Vol [facility]):   71.7 ml 39.96 ml/m  RA Volume:   20.10 ml  11.20 ml/m LA Vol (A4C):   45.9 ml 25.58 ml/m LA Biplane Vol: 57.6 ml 32.10 ml/m  AORTIC VALVE AV Area (Vmax):    1.85 cm AV Area (Vmean):   2.22 cm AV Area (VTI):     2.74 cm AV Vmax:           185.37 cm/s AV Vmean:          111.443 cm/s AV VTI:            0.265 m AV Peak Grad:      13.7 mmHg AV Mean Grad:      6.1 mmHg LVOT Vmax:         135.00 cm/s LVOT Vmean:        97.300 cm/s LVOT VTI:          0.285 m LVOT/AV VTI ratio: 1.08  AORTA Ao Root diam: 2.60 cm Ao Asc diam:  2.70 cm MITRAL VALVE MV Area (PHT): 3.89 cm     SHUNTS MV Decel Time: 195 msec     Systemic VTI:  0.29 m MV E velocity: 114.00 cm/s  Systemic Diam: 1.80 cm MV A velocity: 93.80 cm/s MV E/A ratio:  1.22 Dorn Ross MD Electronically signed by Dorn Ross MD Signature Date/Time: 11/21/2023/4:21:06 PM    Final    DG Chest Port 1 View Result Date: 11/21/2023 CLINICAL DATA:  10026 Shortness of breath 10026 EXAM: PORTABLE CHEST 1 VIEW COMPARISON:  June 28, 2023 FINDINGS: The cardiomediastinal silhouette is  unchanged in contour. Trace LEFT pleural effusion. No pneumothorax. No acute pleuroparenchymal abnormality. IMPRESSION: Trace LEFT pleural effusion. Electronically Signed   By: Corean Salter M.D.   On: 11/21/2023 10:06   IR Tips Result Date: 11/19/2023 CLINICAL DATA:  73 year old with cirrhosis and refractory ascites. Patient requires frequent large volume paracentesis. MELD 3.0 = 15. EXAM: 1. Ultrasound-guided paracentesis 2. Ultrasound-guided access of the right internal jugular vein 3. Ultrasound-guided access of the right common femoral vein 4. Hepatic venogram 5. Intravascular ultrasound 6. Catheterization of the portal vein 7. Portal venous and central manometry 8. Portal venogram 9. Creation of a transhepatic portal vein to hepatic vein shunt MEDICATIONS: As antibiotic prophylaxis, Rocephin  2 g was ordered pre-procedure and administered intravenously within one hour of incision. ANESTHESIA/SEDATION: General - as administered by the Anesthesia department CONTRAST:  75 mL Omnipaque  300, intravenous FLUOROSCOPY TIME:  Radiation Exposure Index (as provided by the fluoroscopic device): 220 mGy Kerma COMPLICATIONS: None immediate. PROCEDURE: Informed written consent was obtained from the patient after a thorough discussion of the procedural risks, benefits and alternatives. All questions were addressed. Maximal Sterile Barrier Technique was utilized including caps, mask, sterile gowns, sterile gloves, sterile drape, hand hygiene and skin antiseptic. A timeout was performed prior to the initiation of the procedure. Dr. Wilkie Lent assisted with the procedure. Right abdomen, right groin and right neck were prepped and draped in sterile fashion. Ultrasound demonstrated a large pocket of fluid in the right abdomen. Ultrasound image was saved for documentation. Skin was anesthetized with 1% lidocaine . Using ultrasound guidance, paracentesis catheter was directed into the ascites. Approximately 5 L of yellow  fluid was drained during the procedure. Paracentesis catheter was removed after the paracentesis. Bandage placed over the puncture site. Ultrasound confirmed a patent right common femoral vein. Using a combination of fluoroscopy and ultrasound, an access site was determined. A small dermatotomy was made at the planned  puncture site. Using ultrasound guidance, access into the right common femoral vein was obtained with visualization of needle entry into the vessel using a standard micropuncture technique. Wire was advanced into the IVC. An 8 French vascular sheath was placed. Through this access site, an 38 French Accunav ICE catheter was advanced with ease under fluoroscopic guidance to the level of the intrahepatic inferior vena cava. A preliminary ultrasound of the right neck was performed and demonstrates a patent internal jugular vein. A permanent ultrasound image was recorded. Using a combination of fluoroscopy and ultrasound, an access site was determined. A small dermatotomy was made at the planned puncture site. Using ultrasound guidance, access into the right internal jugular vein was obtained with visualization of needle entry into the vessel using a standard micropuncture technique. A wire was advanced into the IVC and serial fascial dilation performed. A 10 French tips sheath was placed into the internal jugular vein and advanced to the IVC. The jugular sheath was retracted into the right atrium and manometry was performed. A 5 French angled tip catheter was then directed into the middle hepatic vein. Hepatic venogram was performed. These images demonstrated a patent hepatic vein with no stenosis. The catheter was advanced to a wedge portion of the patent vein over which the 10 French sheath was advanced into the middle hepatic vein. Using ICE ultrasound visualization the catheter and middle hepatic vein as well as the portal anatomy was defined. A planned exit site from the hepatic vein and puncture site  from the portal vein was placed into a single sonographic plane. Under direct ultrasound visualization, the ScorpionX needle was advanced into the portal venous system near the portal vein bifurcation. A Glidewire Advantage was then advanced into the splenic vein. A 5 French marking pigtail catheter was then advanced over the wire into the main portal vein. It was difficult to remove the wire from the pigtail catheter. It was noted that the [facility] Advantage coating had sheared off and was trapped within the pigtail catheter. We were unable to advance a microwire or microcatheter through the pigtail catheter. We attempted to snare the wire coating with micro snare devices but this was also unsuccessful. Back of a stiff Glidewire was advanced into the pigtail catheter adjacent to the sheared off wire coating. The pigtail catheter was advanced further into the portal venous system until the wire was near the portal venous system. The stiff wire never exited the pigtail catheter. The pigtail catheter hub was cut. A 6 French Neuron MAX sheath was advanced over the pigtail catheter and through the 10 French sheath. The 6 French sheath was successfully advanced over the pigtail catheter and into the portal venous system using fluoroscopy. Once the 6 French sheath was within the portal venous system, the pigtail catheter was completely removed. Portal venogram was performed through the 6 French vascular sheath. Superstiff Amplatz wire was placed. A new pigtail catheter was placed. Portal manometry was then performed. The tract was then dilated to 8 mm with an 8 mm x 8 cm Athletis balloon. A 8-10 mm by 8 + 2 cm of Viatorr endograft was placed. This was ultimately dilated to 8 mm. After placement of the shunt, right atrial and portal pressures were repeated. Completion portal venogram demonstrates a patent TIPS endograft without significant gastroesophageal varices. The catheters and sheath were removed and manual  compression was applied to the right internal jugular and right common femoral venous access sites until hemostasis was achieved. The patient was transferred  to the PACU in stable condition. Pre-TIPS Mean Pressures (mmHg): Right atrium: 10 Portal vein: 39 Portosystemic gradient: 29 Post-TIPS Mean Pressures (mmHg): Right atrium:28 Portal vein: 34 Portosystemic gradient: 6 FINDINGS: Main portal vein and TIPS stent were widely patent at the end of the procedure. No significant filling of varices on the final portogram images. 5 L of ascites was removed. IMPRESSION: 1. Successful transjugular portosystemic shunt creation. 2. Portosystemic gradient of 29 mm Hg (absolute portal venous pressure 39 mm Hg) before shunt placement and 6 mm Hg (absolute portal venous pressure 34 mm Hg) after shunt placement. PLAN: Patient will be admitted for overnight observation. Electronically Signed   By: Juliene Balder M.D.   On: 11/19/2023 16:42   IR US  Guide Vasc Access Right Result Date: 11/19/2023 CLINICAL DATA:  73 year old with cirrhosis and refractory ascites. Patient requires frequent large volume paracentesis. MELD 3.0 = 15. EXAM: 1. Ultrasound-guided paracentesis 2. Ultrasound-guided access of the right internal jugular vein 3. Ultrasound-guided access of the right common femoral vein 4. Hepatic venogram 5. Intravascular ultrasound 6. Catheterization of the portal vein 7. Portal venous and central manometry 8. Portal venogram 9. Creation of a transhepatic portal vein to hepatic vein shunt MEDICATIONS: As antibiotic prophylaxis, Rocephin  2 g was ordered pre-procedure and administered intravenously within one hour of incision. ANESTHESIA/SEDATION: General - as administered by the Anesthesia department CONTRAST:  75 mL Omnipaque  300, intravenous FLUOROSCOPY TIME:  Radiation Exposure Index (as provided by the fluoroscopic device): 220 mGy Kerma COMPLICATIONS: None immediate. PROCEDURE: Informed written consent was obtained from the  patient after a thorough discussion of the procedural risks, benefits and alternatives. All questions were addressed. Maximal Sterile Barrier Technique was utilized including caps, mask, sterile gowns, sterile gloves, sterile drape, hand hygiene and skin antiseptic. A timeout was performed prior to the initiation of the procedure. Dr. Wilkie Lent assisted with the procedure. Right abdomen, right groin and right neck were prepped and draped in sterile fashion. Ultrasound demonstrated a large pocket of fluid in the right abdomen. Ultrasound image was saved for documentation. Skin was anesthetized with 1% lidocaine . Using ultrasound guidance, paracentesis catheter was directed into the ascites. Approximately 5 L of yellow fluid was drained during the procedure. Paracentesis catheter was removed after the paracentesis. Bandage placed over the puncture site. Ultrasound confirmed a patent right common femoral vein. Using a combination of fluoroscopy and ultrasound, an access site was determined. A small dermatotomy was made at the planned puncture site. Using ultrasound guidance, access into the right common femoral vein was obtained with visualization of needle entry into the vessel using a standard micropuncture technique. Wire was advanced into the IVC. An 8 French vascular sheath was placed. Through this access site, an 40 French Accunav ICE catheter was advanced with ease under fluoroscopic guidance to the level of the intrahepatic inferior vena cava. A preliminary ultrasound of the right neck was performed and demonstrates a patent internal jugular vein. A permanent ultrasound image was recorded. Using a combination of fluoroscopy and ultrasound, an access site was determined. A small dermatotomy was made at the planned puncture site. Using ultrasound guidance, access into the right internal jugular vein was obtained with visualization of needle entry into the vessel using a standard micropuncture technique. A  wire was advanced into the IVC and serial fascial dilation performed. A 10 French tips sheath was placed into the internal jugular vein and advanced to the IVC. The jugular sheath was retracted into the right atrium and manometry was performed. A  5 French angled tip catheter was then directed into the middle hepatic vein. Hepatic venogram was performed. These images demonstrated a patent hepatic vein with no stenosis. The catheter was advanced to a wedge portion of the patent vein over which the 10 French sheath was advanced into the middle hepatic vein. Using ICE ultrasound visualization the catheter and middle hepatic vein as well as the portal anatomy was defined. A planned exit site from the hepatic vein and puncture site from the portal vein was placed into a single sonographic plane. Under direct ultrasound visualization, the ScorpionX needle was advanced into the portal venous system near the portal vein bifurcation. A Glidewire Advantage was then advanced into the splenic vein. A 5 French marking pigtail catheter was then advanced over the wire into the main portal vein. It was difficult to remove the wire from the pigtail catheter. It was noted that the [facility] Advantage coating had sheared off and was trapped within the pigtail catheter. We were unable to advance a microwire or microcatheter through the pigtail catheter. We attempted to snare the wire coating with micro snare devices but this was also unsuccessful. Back of a stiff Glidewire was advanced into the pigtail catheter adjacent to the sheared off wire coating. The pigtail catheter was advanced further into the portal venous system until the wire was near the portal venous system. The stiff wire never exited the pigtail catheter. The pigtail catheter hub was cut. A 6 French Neuron MAX sheath was advanced over the pigtail catheter and through the 10 French sheath. The 6 French sheath was successfully advanced over the pigtail catheter and into  the portal venous system using fluoroscopy. Once the 6 French sheath was within the portal venous system, the pigtail catheter was completely removed. Portal venogram was performed through the 6 French vascular sheath. Superstiff Amplatz wire was placed. A new pigtail catheter was placed. Portal manometry was then performed. The tract was then dilated to 8 mm with an 8 mm x 8 cm Athletis balloon. A 8-10 mm by 8 + 2 cm of Viatorr endograft was placed. This was ultimately dilated to 8 mm. After placement of the shunt, right atrial and portal pressures were repeated. Completion portal venogram demonstrates a patent TIPS endograft without significant gastroesophageal varices. The catheters and sheath were removed and manual compression was applied to the right internal jugular and right common femoral venous access sites until hemostasis was achieved. The patient was transferred to the PACU in stable condition. Pre-TIPS Mean Pressures (mmHg): Right atrium: 10 Portal vein: 39 Portosystemic gradient: 29 Post-TIPS Mean Pressures (mmHg): Right atrium:28 Portal vein: 34 Portosystemic gradient: 6 FINDINGS: Main portal vein and TIPS stent were widely patent at the end of the procedure. No significant filling of varices on the final portogram images. 5 L of ascites was removed. IMPRESSION: 1. Successful transjugular portosystemic shunt creation. 2. Portosystemic gradient of 29 mm Hg (absolute portal venous pressure 39 mm Hg) before shunt placement and 6 mm Hg (absolute portal venous pressure 34 mm Hg) after shunt placement. PLAN: Patient will be admitted for overnight observation. Electronically Signed   By: Juliene Balder M.D.   On: 11/19/2023 16:42   IR US  Guide Vasc Access Right Result Date: 11/19/2023 CLINICAL DATA:  73 year old with cirrhosis and refractory ascites. Patient requires frequent large volume paracentesis. MELD 3.0 = 15. EXAM: 1. Ultrasound-guided paracentesis 2. Ultrasound-guided access of the right internal  jugular vein 3. Ultrasound-guided access of the right common femoral vein 4. Hepatic venogram  5. Intravascular ultrasound 6. Catheterization of the portal vein 7. Portal venous and central manometry 8. Portal venogram 9. Creation of a transhepatic portal vein to hepatic vein shunt MEDICATIONS: As antibiotic prophylaxis, Rocephin  2 g was ordered pre-procedure and administered intravenously within one hour of incision. ANESTHESIA/SEDATION: General - as administered by the Anesthesia department CONTRAST:  75 mL Omnipaque  300, intravenous FLUOROSCOPY TIME:  Radiation Exposure Index (as provided by the fluoroscopic device): 220 mGy Kerma COMPLICATIONS: None immediate. PROCEDURE: Informed written consent was obtained from the patient after a thorough discussion of the procedural risks, benefits and alternatives. All questions were addressed. Maximal Sterile Barrier Technique was utilized including caps, mask, sterile gowns, sterile gloves, sterile drape, hand hygiene and skin antiseptic. A timeout was performed prior to the initiation of the procedure. Dr. Wilkie Lent assisted with the procedure. Right abdomen, right groin and right neck were prepped and draped in sterile fashion. Ultrasound demonstrated a large pocket of fluid in the right abdomen. Ultrasound image was saved for documentation. Skin was anesthetized with 1% lidocaine . Using ultrasound guidance, paracentesis catheter was directed into the ascites. Approximately 5 L of yellow fluid was drained during the procedure. Paracentesis catheter was removed after the paracentesis. Bandage placed over the puncture site. Ultrasound confirmed a patent right common femoral vein. Using a combination of fluoroscopy and ultrasound, an access site was determined. A small dermatotomy was made at the planned puncture site. Using ultrasound guidance, access into the right common femoral vein was obtained with visualization of needle entry into the vessel using a standard  micropuncture technique. Wire was advanced into the IVC. An 8 French vascular sheath was placed. Through this access site, an 52 French Accunav ICE catheter was advanced with ease under fluoroscopic guidance to the level of the intrahepatic inferior vena cava. A preliminary ultrasound of the right neck was performed and demonstrates a patent internal jugular vein. A permanent ultrasound image was recorded. Using a combination of fluoroscopy and ultrasound, an access site was determined. A small dermatotomy was made at the planned puncture site. Using ultrasound guidance, access into the right internal jugular vein was obtained with visualization of needle entry into the vessel using a standard micropuncture technique. A wire was advanced into the IVC and serial fascial dilation performed. A 10 French tips sheath was placed into the internal jugular vein and advanced to the IVC. The jugular sheath was retracted into the right atrium and manometry was performed. A 5 French angled tip catheter was then directed into the middle hepatic vein. Hepatic venogram was performed. These images demonstrated a patent hepatic vein with no stenosis. The catheter was advanced to a wedge portion of the patent vein over which the 10 French sheath was advanced into the middle hepatic vein. Using ICE ultrasound visualization the catheter and middle hepatic vein as well as the portal anatomy was defined. A planned exit site from the hepatic vein and puncture site from the portal vein was placed into a single sonographic plane. Under direct ultrasound visualization, the ScorpionX needle was advanced into the portal venous system near the portal vein bifurcation. A Glidewire Advantage was then advanced into the splenic vein. A 5 French marking pigtail catheter was then advanced over the wire into the main portal vein. It was difficult to remove the wire from the pigtail catheter. It was noted that the [facility] Advantage coating had sheared  off and was trapped within the pigtail catheter. We were unable to advance a microwire or microcatheter through the  pigtail catheter. We attempted to snare the wire coating with micro snare devices but this was also unsuccessful. Back of a stiff Glidewire was advanced into the pigtail catheter adjacent to the sheared off wire coating. The pigtail catheter was advanced further into the portal venous system until the wire was near the portal venous system. The stiff wire never exited the pigtail catheter. The pigtail catheter hub was cut. A 6 French Neuron MAX sheath was advanced over the pigtail catheter and through the 10 French sheath. The 6 French sheath was successfully advanced over the pigtail catheter and into the portal venous system using fluoroscopy. Once the 6 French sheath was within the portal venous system, the pigtail catheter was completely removed. Portal venogram was performed through the 6 French vascular sheath. Superstiff Amplatz wire was placed. A new pigtail catheter was placed. Portal manometry was then performed. The tract was then dilated to 8 mm with an 8 mm x 8 cm Athletis balloon. A 8-10 mm by 8 + 2 cm of Viatorr endograft was placed. This was ultimately dilated to 8 mm. After placement of the shunt, right atrial and portal pressures were repeated. Completion portal venogram demonstrates a patent TIPS endograft without significant gastroesophageal varices. The catheters and sheath were removed and manual compression was applied to the right internal jugular and right common femoral venous access sites until hemostasis was achieved. The patient was transferred to the PACU in stable condition. Pre-TIPS Mean Pressures (mmHg): Right atrium: 10 Portal vein: 39 Portosystemic gradient: 29 Post-TIPS Mean Pressures (mmHg): Right atrium:28 Portal vein: 34 Portosystemic gradient: 6 FINDINGS: Main portal vein and TIPS stent were widely patent at the end of the procedure. No significant filling of  varices on the final portogram images. 5 L of ascites was removed. IMPRESSION: 1. Successful transjugular portosystemic shunt creation. 2. Portosystemic gradient of 29 mm Hg (absolute portal venous pressure 39 mm Hg) before shunt placement and 6 mm Hg (absolute portal venous pressure 34 mm Hg) after shunt placement. PLAN: Patient will be admitted for overnight observation. Electronically Signed   By: Juliene Balder M.D.   On: 11/19/2023 16:42   IR Paracentesis Result Date: 11/19/2023 CLINICAL DATA:  73 year old with cirrhosis and refractory ascites. Patient requires frequent large volume paracentesis. MELD 3.0 = 15. EXAM: 1. Ultrasound-guided paracentesis 2. Ultrasound-guided access of the right internal jugular vein 3. Ultrasound-guided access of the right common femoral vein 4. Hepatic venogram 5. Intravascular ultrasound 6. Catheterization of the portal vein 7. Portal venous and central manometry 8. Portal venogram 9. Creation of a transhepatic portal vein to hepatic vein shunt MEDICATIONS: As antibiotic prophylaxis, Rocephin  2 g was ordered pre-procedure and administered intravenously within one hour of incision. ANESTHESIA/SEDATION: General - as administered by the Anesthesia department CONTRAST:  75 mL Omnipaque  300, intravenous FLUOROSCOPY TIME:  Radiation Exposure Index (as provided by the fluoroscopic device): 220 mGy Kerma COMPLICATIONS: None immediate. PROCEDURE: Informed written consent was obtained from the patient after a thorough discussion of the procedural risks, benefits and alternatives. All questions were addressed. Maximal Sterile Barrier Technique was utilized including caps, mask, sterile gowns, sterile gloves, sterile drape, hand hygiene and skin antiseptic. A timeout was performed prior to the initiation of the procedure. Dr. Wilkie Lent assisted with the procedure. Right abdomen, right groin and right neck were prepped and draped in sterile fashion. Ultrasound demonstrated a large  pocket of fluid in the right abdomen. Ultrasound image was saved for documentation. Skin was anesthetized with 1% lidocaine . Using ultrasound  guidance, paracentesis catheter was directed into the ascites. Approximately 5 L of yellow fluid was drained during the procedure. Paracentesis catheter was removed after the paracentesis. Bandage placed over the puncture site. Ultrasound confirmed a patent right common femoral vein. Using a combination of fluoroscopy and ultrasound, an access site was determined. A small dermatotomy was made at the planned puncture site. Using ultrasound guidance, access into the right common femoral vein was obtained with visualization of needle entry into the vessel using a standard micropuncture technique. Wire was advanced into the IVC. An 8 French vascular sheath was placed. Through this access site, an 31 French Accunav ICE catheter was advanced with ease under fluoroscopic guidance to the level of the intrahepatic inferior vena cava. A preliminary ultrasound of the right neck was performed and demonstrates a patent internal jugular vein. A permanent ultrasound image was recorded. Using a combination of fluoroscopy and ultrasound, an access site was determined. A small dermatotomy was made at the planned puncture site. Using ultrasound guidance, access into the right internal jugular vein was obtained with visualization of needle entry into the vessel using a standard micropuncture technique. A wire was advanced into the IVC and serial fascial dilation performed. A 10 French tips sheath was placed into the internal jugular vein and advanced to the IVC. The jugular sheath was retracted into the right atrium and manometry was performed. A 5 French angled tip catheter was then directed into the middle hepatic vein. Hepatic venogram was performed. These images demonstrated a patent hepatic vein with no stenosis. The catheter was advanced to a wedge portion of the patent vein over which the  10 French sheath was advanced into the middle hepatic vein. Using ICE ultrasound visualization the catheter and middle hepatic vein as well as the portal anatomy was defined. A planned exit site from the hepatic vein and puncture site from the portal vein was placed into a single sonographic plane. Under direct ultrasound visualization, the ScorpionX needle was advanced into the portal venous system near the portal vein bifurcation. A Glidewire Advantage was then advanced into the splenic vein. A 5 French marking pigtail catheter was then advanced over the wire into the main portal vein. It was difficult to remove the wire from the pigtail catheter. It was noted that the [facility] Advantage coating had sheared off and was trapped within the pigtail catheter. We were unable to advance a microwire or microcatheter through the pigtail catheter. We attempted to snare the wire coating with micro snare devices but this was also unsuccessful. Back of a stiff Glidewire was advanced into the pigtail catheter adjacent to the sheared off wire coating. The pigtail catheter was advanced further into the portal venous system until the wire was near the portal venous system. The stiff wire never exited the pigtail catheter. The pigtail catheter hub was cut. A 6 French Neuron MAX sheath was advanced over the pigtail catheter and through the 10 French sheath. The 6 French sheath was successfully advanced over the pigtail catheter and into the portal venous system using fluoroscopy. Once the 6 French sheath was within the portal venous system, the pigtail catheter was completely removed. Portal venogram was performed through the 6 French vascular sheath. Superstiff Amplatz wire was placed. A new pigtail catheter was placed. Portal manometry was then performed. The tract was then dilated to 8 mm with an 8 mm x 8 cm Athletis balloon. A 8-10 mm by 8 + 2 cm of Viatorr endograft was placed. This was ultimately  dilated to 8 mm. After  placement of the shunt, right atrial and portal pressures were repeated. Completion portal venogram demonstrates a patent TIPS endograft without significant gastroesophageal varices. The catheters and sheath were removed and manual compression was applied to the right internal jugular and right common femoral venous access sites until hemostasis was achieved. The patient was transferred to the PACU in stable condition. Pre-TIPS Mean Pressures (mmHg): Right atrium: 10 Portal vein: 39 Portosystemic gradient: 29 Post-TIPS Mean Pressures (mmHg): Right atrium:28 Portal vein: 34 Portosystemic gradient: 6 FINDINGS: Main portal vein and TIPS stent were widely patent at the end of the procedure. No significant filling of varices on the final portogram images. 5 L of ascites was removed. IMPRESSION: 1. Successful transjugular portosystemic shunt creation. 2. Portosystemic gradient of 29 mm Hg (absolute portal venous pressure 39 mm Hg) before shunt placement and 6 mm Hg (absolute portal venous pressure 34 mm Hg) after shunt placement. PLAN: Patient will be admitted for overnight observation. Electronically Signed   By: Juliene Balder M.D.   On: 11/19/2023 16:42   IR INTRAVASCULAR ULTRASOUND NON CORONARY Result Date: 11/19/2023 CLINICAL DATA:  73 year old with cirrhosis and refractory ascites. Patient requires frequent large volume paracentesis. MELD 3.0 = 15. EXAM: 1. Ultrasound-guided paracentesis 2. Ultrasound-guided access of the right internal jugular vein 3. Ultrasound-guided access of the right common femoral vein 4. Hepatic venogram 5. Intravascular ultrasound 6. Catheterization of the portal vein 7. Portal venous and central manometry 8. Portal venogram 9. Creation of a transhepatic portal vein to hepatic vein shunt MEDICATIONS: As antibiotic prophylaxis, Rocephin  2 g was ordered pre-procedure and administered intravenously within one hour of incision. ANESTHESIA/SEDATION: General - as administered by the Anesthesia  department CONTRAST:  75 mL Omnipaque  300, intravenous FLUOROSCOPY TIME:  Radiation Exposure Index (as provided by the fluoroscopic device): 220 mGy Kerma COMPLICATIONS: None immediate. PROCEDURE: Informed written consent was obtained from the patient after a thorough discussion of the procedural risks, benefits and alternatives. All questions were addressed. Maximal Sterile Barrier Technique was utilized including caps, mask, sterile gowns, sterile gloves, sterile drape, hand hygiene and skin antiseptic. A timeout was performed prior to the initiation of the procedure. Dr. Wilkie Lent assisted with the procedure. Right abdomen, right groin and right neck were prepped and draped in sterile fashion. Ultrasound demonstrated a large pocket of fluid in the right abdomen. Ultrasound image was saved for documentation. Skin was anesthetized with 1% lidocaine . Using ultrasound guidance, paracentesis catheter was directed into the ascites. Approximately 5 L of yellow fluid was drained during the procedure. Paracentesis catheter was removed after the paracentesis. Bandage placed over the puncture site. Ultrasound confirmed a patent right common femoral vein. Using a combination of fluoroscopy and ultrasound, an access site was determined. A small dermatotomy was made at the planned puncture site. Using ultrasound guidance, access into the right common femoral vein was obtained with visualization of needle entry into the vessel using a standard micropuncture technique. Wire was advanced into the IVC. An 8 French vascular sheath was placed. Through this access site, an 44 French Accunav ICE catheter was advanced with ease under fluoroscopic guidance to the level of the intrahepatic inferior vena cava. A preliminary ultrasound of the right neck was performed and demonstrates a patent internal jugular vein. A permanent ultrasound image was recorded. Using a combination of fluoroscopy and ultrasound, an access site was  determined. A small dermatotomy was made at the planned puncture site. Using ultrasound guidance, access into the right internal jugular  vein was obtained with visualization of needle entry into the vessel using a standard micropuncture technique. A wire was advanced into the IVC and serial fascial dilation performed. A 10 French tips sheath was placed into the internal jugular vein and advanced to the IVC. The jugular sheath was retracted into the right atrium and manometry was performed. A 5 French angled tip catheter was then directed into the middle hepatic vein. Hepatic venogram was performed. These images demonstrated a patent hepatic vein with no stenosis. The catheter was advanced to a wedge portion of the patent vein over which the 10 French sheath was advanced into the middle hepatic vein. Using ICE ultrasound visualization the catheter and middle hepatic vein as well as the portal anatomy was defined. A planned exit site from the hepatic vein and puncture site from the portal vein was placed into a single sonographic plane. Under direct ultrasound visualization, the ScorpionX needle was advanced into the portal venous system near the portal vein bifurcation. A Glidewire Advantage was then advanced into the splenic vein. A 5 French marking pigtail catheter was then advanced over the wire into the main portal vein. It was difficult to remove the wire from the pigtail catheter. It was noted that the [facility] Advantage coating had sheared off and was trapped within the pigtail catheter. We were unable to advance a microwire or microcatheter through the pigtail catheter. We attempted to snare the wire coating with micro snare devices but this was also unsuccessful. Back of a stiff Glidewire was advanced into the pigtail catheter adjacent to the sheared off wire coating. The pigtail catheter was advanced further into the portal venous system until the wire was near the portal venous system. The stiff wire  never exited the pigtail catheter. The pigtail catheter hub was cut. A 6 French Neuron MAX sheath was advanced over the pigtail catheter and through the 10 French sheath. The 6 French sheath was successfully advanced over the pigtail catheter and into the portal venous system using fluoroscopy. Once the 6 French sheath was within the portal venous system, the pigtail catheter was completely removed. Portal venogram was performed through the 6 French vascular sheath. Superstiff Amplatz wire was placed. A new pigtail catheter was placed. Portal manometry was then performed. The tract was then dilated to 8 mm with an 8 mm x 8 cm Athletis balloon. A 8-10 mm by 8 + 2 cm of Viatorr endograft was placed. This was ultimately dilated to 8 mm. After placement of the shunt, right atrial and portal pressures were repeated. Completion portal venogram demonstrates a patent TIPS endograft without significant gastroesophageal varices. The catheters and sheath were removed and manual compression was applied to the right internal jugular and right common femoral venous access sites until hemostasis was achieved. The patient was transferred to the PACU in stable condition. Pre-TIPS Mean Pressures (mmHg): Right atrium: 10 Portal vein: 39 Portosystemic gradient: 29 Post-TIPS Mean Pressures (mmHg): Right atrium:28 Portal vein: 34 Portosystemic gradient: 6 FINDINGS: Main portal vein and TIPS stent were widely patent at the end of the procedure. No significant filling of varices on the final portogram images. 5 L of ascites was removed. IMPRESSION: 1. Successful transjugular portosystemic shunt creation. 2. Portosystemic gradient of 29 mm Hg (absolute portal venous pressure 39 mm Hg) before shunt placement and 6 mm Hg (absolute portal venous pressure 34 mm Hg) after shunt placement. PLAN: Patient will be admitted for overnight observation. Electronically Signed   By: Juliene Philip HERO.D.  On: 11/19/2023 16:42   US  Paracentesis Result Date:  11/17/2023 INDICATION: Patient with a history of cirrhosis with recurrent ascites. Interventional Radiology asked to perform a diagnostic and therapeutic paracentesis. 5 L max EXAM: ULTRASOUND GUIDED PARACENTESIS MEDICATIONS: 1% lidocaine  10 mL COMPLICATIONS: None immediate. PROCEDURE: Informed written consent was obtained from the patient after a discussion of the risks, benefits and alternatives to treatment. A timeout was performed prior to the initiation of the procedure. Initial ultrasound scanning demonstrates a large amount of ascites within the left lower abdominal quadrant. The left lower abdomen was prepped and draped in the usual sterile fashion. 1% lidocaine  was used for local anesthesia. Following this, a 19 gauge, 7-cm, Yueh catheter was introduced. An ultrasound image was saved for documentation purposes. The paracentesis was performed. The catheter was removed and a dressing was applied. The patient tolerated the procedure well without immediate post procedural complication. Patient received post-procedure intravenous albumin ; see nursing notes for details. FINDINGS: A total of approximately 5 L of clear yellow fluid was removed. Samples were sent to the laboratory as requested by the clinical team. IMPRESSION: Successful ultrasound-guided paracentesis yielding 5 liters of peritoneal fluid. Procedure performed by: Warren Dais, NP PLAN: The patient is scheduled for TIPS procedure November 19, 2023 Electronically Signed   By: Ester Sides M.D.   On: 11/17/2023 11:33    Alm Schneider, DO  [facility]  If 7PM-7AM, please contact night-coverage [email or web address] Password [email or web address] 12/11/2023, 6:57 AM   LOS: 1 day

## 2023-12-11 NOTE — Plan of Care (Signed)

## 2023-12-11 NOTE — Progress Notes (Signed)
   12/11/23 1254  TOC Brief Assessment  Insurance and Status Reviewed  Patient has primary care physician Yes  Home environment has been reviewed w/children  Prior level of function: currently using front wheeled walker and BSC  Prior/Current Home Services No current home services  Social Drivers of Health Review SDOH reviewed no interventions necessary  Readmission risk has been reviewed Yes  Transition of care needs no transition of care needs at this time

## 2023-12-11 NOTE — Progress Notes (Addendum)
 48: RN asked Dr Tat about NPO order and if MD would like patient to receive meds with a few sips water  this am. MD dc'd NPO order and ordered diet for patient.   9171: RN made Dr Tat aware that patient has propanolol 80 mg ER po for tremors ordered to be given now. Patient BP 95/41 and rechecked was BP 100/47 map 63 HR 66-71. RN asked if MD would like propanolol given as ordered. MD dc'd propanolol ER and ordered propanolol IR 10 mg po twice daily. RN gave this as ordered.   1424: RN made Dr Tat aware that BP 107/39 map 58 HR 77. Post LR 1000 ml bolus completed and is on LR at  75ml/hr now as ordered. MD aware with no new orders.    1721: BP 106/41 and  map 61, HR 77

## 2023-12-11 NOTE — Progress Notes (Signed)
 83: RN asked Dr Tat about NPO order and if MD would like patient to receive meds with a few sips water  this am. MD dc'd NPO order and ordered diet for patient.   1250: Brandie charge RN notified Dr Tat and this RN that Critical lactic 2.7 called from lab. Dr Tat ordered LR 1000 ml IV bolus and then LR at 75 ml/hr this was given.

## 2023-12-12 DIAGNOSIS — K7469 Other cirrhosis of liver: Secondary | ICD-10-CM | POA: Diagnosis not present

## 2023-12-12 DIAGNOSIS — Z95828 Presence of other vascular implants and grafts: Secondary | ICD-10-CM | POA: Diagnosis not present

## 2023-12-12 DIAGNOSIS — I5032 Chronic diastolic (congestive) heart failure: Secondary | ICD-10-CM | POA: Diagnosis not present

## 2023-12-12 DIAGNOSIS — K7682 Hepatic encephalopathy: Secondary | ICD-10-CM | POA: Diagnosis not present

## 2023-12-12 LAB — BASIC METABOLIC PANEL WITH GFR
Anion gap: 11 (ref 5–15)
BUN: 9 mg/dL (ref 8–23)
CO2: 17 mmol/L — ABNORMAL LOW (ref 22–32)
Calcium: 8.8 mg/dL — ABNORMAL LOW (ref 8.9–10.3)
Chloride: 111 mmol/L (ref 98–111)
Creatinine, Ser: 0.91 mg/dL (ref 0.44–1.00)
GFR, Estimated: >60 mL/min (ref >60)
Glucose, Bld: 134 mg/dL — ABNORMAL HIGH (ref 70–99)
Potassium: 4.8 mmol/L (ref 3.5–5.1)
Sodium: 139 mmol/L (ref 135–145)

## 2023-12-12 LAB — GLUCOSE, CAPILLARY
Glucose-Capillary: 126 mg/dL — ABNORMAL HIGH (ref 70–99)
Glucose-Capillary: 224 mg/dL — ABNORMAL HIGH (ref 70–99)
Glucose-Capillary: 192 mg/dL — ABNORMAL HIGH (ref 70–99)
Glucose-Capillary: 190 mg/dL — ABNORMAL HIGH (ref 70–99)

## 2023-12-12 LAB — LACTIC ACID, PLASMA: Lactic Acid, Venous: 2.5 mmol/L (ref 0.5–1.9)

## 2023-12-12 LAB — AMMONIA: Ammonia: 59 umol/L — ABNORMAL HIGH (ref 9–35)

## 2023-12-12 LAB — HEMOGLOBIN A1C
Hgb A1c MFr Bld: 5.1 % (ref 4.8–5.6)
Mean Plasma Glucose: 100 mg/dL

## 2023-12-12 LAB — MAGNESIUM: Magnesium: 2 mg/dL (ref 1.7–2.4)

## 2023-12-12 MED ORDER — ONDANSETRON HCL 4 MG/2ML IJ SOLN
4.0000 mg | Freq: Four times a day (QID) | INTRAMUSCULAR | Status: DC | PRN
  Administered 2023-12-12: 4 mg via INTRAVENOUS
  Filled 2023-12-12: qty 2

## 2023-12-12 NOTE — Therapy (Signed)
 Evaluation attempted; nursing in room bathing patient; will try again as schedule permits.  8:11 AM, 12/12/23 Aylanie Cubillos Small Yoseph Haile MPT [facility] [place] [phone]

## 2023-12-12 NOTE — Plan of Care (Signed)

## 2023-12-12 NOTE — Progress Notes (Addendum)
 PROGRESS NOTE  Andrea Dickson FMW:[id number] DOB: 08-27-1950 DOA: 12/10/2023 PCP: Lari Elspeth BRAVO, MD  Brief History:  73 year old female with a history of NASH cirrhosis, former smoker, diabetes mellitus type 2, hypertension, hyperlipidemia, bipolar disorder presenting with altered mental status.  The patient had a recent hospitalization from 11/19/2023 to 11/23/2023 when she was admitted for TIPS procedure which she underwent on 11/19/2023.  She subsequently developed fluid overload which was felt to be secondary decompensated liver cirrhosis and acute HFpEF.  She was diuresed and transition to furosemide  40 mg daily and spironolactone  100 mg daily.  The patient developed chest pain which was felt to be MSK etiology.  Echocardiogram showed EF 70 to 75% with no WMA.  She was discharged home with home health physical therapy.  After discharge from the hospital on 11/23/2023, the patient stopped taking her lactulose .  The patient subsequently developed increasing confusion for the past 3 days.  The patient's son discovered that the patient had not been taking her lactulose  and restarted it on the day prior to admission.  However the patient remained confused.  As result the patient was brought to the hospital for further evaluation treatment. Patient denies fevers, chills, headache, chest pain, dyspnea, nausea, vomiting, diarrhea, abdominal pain, dysuria, hematuria, hematochezia, and melena.   In the ED, the patient was afebrile and hemodynamically stable with oxygen saturation 100% room air. WBC 4.2, hemoglobin 10.2, platelets 100.  Sodium 141, potassium 3.3, bicarbonate 20, serum creatinine 1.29.  Ammonia 72.  The patient was started on lactulose .   Assessment/Plan: Hepatic encephalopathy - Presented with ammonia 72 and altered mental status - 12/11/2023--patient remains somnolent, but follows commands and speaks fluently--drifts back to sleep if not stimulated - Continue  lactulose  30 g 3 times daily - UA negative for pyuria - CT brain negative - B12--665 - TSH--0.978 - Folate--19.9   NASH Liver Cirrohsis, decompensated -hold lasix  and spironolactone  temporarily with lactic acidosis -Status post TIPS 11/19/2023 - Monitor BMP   Lactic acidosis -Likely secondary to delayed clearance from hepatic dysfunction - Blood cultures x 2 sets--neg to date - UA negative for pyuria - Personally reviewed chest x-ray--no infiltrates or edema   CKD stage IIIa - Baseline creatinine 1.0-1.2 - Monitor with diuretics   Bipolar disorder - Continue Wellbutrin  - Holding Remeron  temporarily   Mixed hyperlipidemia - Continue Zetia    Diabetes mellitus type 2 - Not on any agents in the outpatient setting - Check hemoglobin A1c--result pending   Hypokalemia -replete -check mag 2.0   Chronic HFpEF -compensated - 11/21/2023 echo EF 70 to 75%, no WMA, normal RV             Family Communication:  son 11/30   Consultants:  none   Code Status:  DNR   DVT Prophylaxis:  SCDs     Procedures: As Listed in Progress Note Above   Antibiotics: None               Subjective: Patient denies fevers, chills, headache, chest pain, dyspnea, nausea, vomiting, diarrhea, abdominal pain, dysuria, hematuria, hematochezia, and melena.   Objective: Vitals:   12/11/23 1956 12/12/23 0356 12/12/23 0929 12/12/23 1355  BP: (!) 113/47 (!) 109/41 (!) 109/42 (!) 107/41  Pulse: 80 76 75 74  Resp: 18 16 18 16   Temp: 99.4 F (37.4 C) 99.7 F (37.6 C) 98.1 F (36.7 C) 98.2 F (36.8 C)  TempSrc: Oral Oral Oral Oral  SpO2: 97%  97% 100% 100%  Weight:      Height:        Intake/Output Summary (Last 24 hours) at 12/12/2023 1601 Last data filed at 12/12/2023 0857 Gross per 24 hour  Intake 1810.66 ml  Output --  Net 1810.66 ml   Weight change:  Exam:  General:  Pt is alert, follows commands appropriately, not in acute distress HEENT: No icterus, No thrush,  No neck mass, [place]/AT Cardiovascular: RRR, S1/S2, no rubs, no gallops Respiratory: CTA bilaterally, no wheezing, no crackles, no rhonchi Abdomen: Soft/+BS, non tender, non distended, no guarding Extremities: No edema, No lymphangitis, No petechiae, No rashes, no synovitis   Data Reviewed: I have personally reviewed following labs and imaging studies Basic Metabolic Panel: Recent Labs  Lab 12/10/23 1821 12/11/23 0311 12/11/23 0729 12/12/23 0530  NA 141 144  --  139  K 3.3* 3.8  --  4.8  CL 106 114*  --  111  CO2 20* 19*  --  17*  GLUCOSE 169* 144*  --  134*  BUN 13 11  --  9  CREATININE 1.29* 1.02*  --  0.91  CALCIUM  9.4 8.9  --  8.8*  MG  --   --  2.0 2.0   Liver Function Tests: Recent Labs  Lab 12/10/23 1821 12/11/23 0311  AST 43* 33  ALT 19 13  ALKPHOS 198* 148*  BILITOT 5.5* 4.6*  PROT 7.7 6.0*  ALBUMIN  4.4 3.4*   No results for input(s): LIPASE, AMYLASE in the last 168 hours. Recent Labs  Lab 12/10/23 1857 12/11/23 0311 12/12/23 0530  AMMONIA 72* 50* 59*   Coagulation Profile: No results for input(s): INR, PROTIME in the last 168 hours. CBC: Recent Labs  Lab 12/10/23 1821  WBC 4.2  NEUTROABS 3.0  HGB 10.3*  HCT 31.8*  MCV 95.2  PLT 100*   Cardiac Enzymes: No results for input(s): CKTOTAL, CKMB, CKMBINDEX, TROPONINI in the last 168 hours. BNP: Invalid input(s): POCBNP CBG: Recent Labs  Lab 12/11/23 1122 12/11/23 1608 12/11/23 2140 12/12/23 0709 12/12/23 1136  GLUCAP 224* 200* 171* 126* 224*   HbA1C: No results for input(s): HGBA1C in the last 72 hours. Urine analysis:    Component Value Date/Time   COLORURINE YELLOW 12/10/2023 1814   APPEARANCEUR CLEAR 12/10/2023 1814   APPEARANCEUR Clear 01/14/2023 1045   LABSPEC 1.009 12/10/2023 1814   PHURINE 5.0 12/10/2023 1814   GLUCOSEU NEGATIVE 12/10/2023 1814   HGBUR NEGATIVE 12/10/2023 1814   BILIRUBINUR NEGATIVE 12/10/2023 1814   BILIRUBINUR Negative 01/14/2023 1045    KETONESUR NEGATIVE 12/10/2023 1814   PROTEINUR NEGATIVE 12/10/2023 1814   UROBILINOGEN 1.0 10/08/2015 1347   NITRITE NEGATIVE 12/10/2023 1814   LEUKOCYTESUR NEGATIVE 12/10/2023 1814   Sepsis Labs: @LABRCNTIP (procalcitonin:4,lacticidven:4) ) Recent Results (from the past 240 hours)  Culture, blood (Routine X 2) w Reflex to ID Panel     Status: None (Preliminary result)   Collection Time: 12/11/23  7:29 AM   Specimen: Left Antecubital; Blood  Result Value Ref Range Status   Specimen Description   Final    LEFT ANTECUBITAL BOTTLES DRAWN AEROBIC AND ANAEROBIC   Special Requests Blood Culture adequate volume  Final   Culture   Final    NO GROWTH 1 DAY Performed at [facility], [address], [place], [place] [address]    Report Status PENDING  Incomplete  Culture, blood (Routine X 2) w Reflex to ID Panel     Status: None (Preliminary result)   Collection  Time: 12/11/23  7:29 AM   Specimen: BLOOD RIGHT HAND  Result Value Ref Range Status   Specimen Description   Final    BLOOD RIGHT HAND BOTTLES DRAWN AEROBIC AND ANAEROBIC   Special Requests Blood Culture adequate volume  Final   Culture   Final    NO GROWTH 1 DAY Performed at [facility], [address], [place], [place] [address]    Report Status PENDING  Incomplete     Scheduled Meds:  buPROPion   150 mg Oral Daily   ezetimibe   10 mg Oral Daily   ibuprofen   400 mg Oral Once   insulin  aspart  0-6 Units Subcutaneous TID WC   lactulose   30 g Oral TID   lipase/protease/amylase  60,000 Units Oral TID WC   mirtazapine   15 mg Oral QHS   pantoprazole   40 mg Oral Daily   propranolol   10 mg Oral BID   trimethoprim   100 mg Oral QHS   Continuous Infusions:  lactated ringers  75 mL/hr at 12/12/23 9666    Procedures/Studies: DG Chest Portable 1 View Result Date: 12/10/2023 CLINICAL DATA:  Fall EXAM: PORTABLE CHEST 1 VIEW COMPARISON:  Chest x-ray 11/21/2023 FINDINGS: The heart size and mediastinal contours are within normal  limits. Both lungs are clear. The visualized skeletal structures are unremarkable. IMPRESSION: No active disease. Electronically Signed   By: Greig Pique M.D.   On: 12/10/2023 19:10   DG Knee Complete 4 Views Left Result Date: 12/10/2023 CLINICAL DATA:  Fall EXAM: LEFT KNEE - COMPLETE 4+ VIEW COMPARISON:  None Available. FINDINGS: No evidence of fracture, dislocation, or joint effusion. There are mild degenerative changes of the knee. There is medial and lateral compartment chondrocalcinosis. Soft tissues are unremarkable. IMPRESSION: 1. No acute fracture or dislocation. 2. Mild degenerative changes of the knee. Electronically Signed   By: Greig Pique M.D.   On: 12/10/2023 19:10   CT Head Wo Contrast Result Date: 12/10/2023 EXAM: CT HEAD WITHOUT CONTRAST 12/10/2023 06:40:46 PM TECHNIQUE: CT of the head was performed without the administration of intravenous contrast. Automated exposure control, iterative reconstruction, and/or weight based adjustment of the mA/kV was utilized to reduce the radiation dose to as low as reasonably achievable. COMPARISON: CT head 06/20/2022. CLINICAL HISTORY: Head trauma, minor (Age >= 65y). FINDINGS: BRAIN AND VENTRICLES: No acute hemorrhage. No evidence of acute infarct. No hydrocephalus. No extra-axial collection. No mass effect or midline shift. ORBITS: Bilateral lens replacements. SINUSES: No acute abnormality. SOFT TISSUES AND SKULL: Left frontal scalp hematoma. Similar small exostosis along the outer table of the right frontal calvarium likely reflecting a small osteoma. No skull fracture. IMPRESSION: 1. No acute intracranial abnormality. 2. Left frontal scalp hematoma. Electronically signed by: Donnice Mania MD 12/10/2023 06:54 PM EST RP Workstation: [id number]   ECHOCARDIOGRAM COMPLETE Result Date: 11/21/2023    ECHOCARDIOGRAM REPORT   Patient Name:   KATHYLEEN RADICE Date of Exam: 11/21/2023 Medical Rec #:  [id number]          Height:       66.0 in Accession #:     [id number]         Weight:       155.0 lb Date of Birth:  06-13-1950          BSA:          1.794 m Patient Age:    73 years           BP:  115/51 mmHg Patient Gender: F                  HR:           83 bpm. Exam Location:  Inpatient Procedure: 2D Echo, Color Doppler and Cardiac Doppler (Both Spectral and Color            Flow Doppler were utilized during procedure). Indications:    Dyspnea R06.00  History:        Patient has prior history of Echocardiogram examinations, most                 recent 08/31/2023. Risk Factors:Dyslipidemia and Diabetes.  Sonographer:    Tinnie Gosling RDCS Referring Phys: [id number] DORN FALCON BRANCH IMPRESSIONS  1. Left ventricular ejection fraction, by estimation, is 70 to 75%. The left ventricle has hyperdynamic function. The left ventricle has no regional wall motion abnormalities. Left ventricular diastolic parameters are indeterminate.  2. Right ventricular systolic function is normal. The right ventricular size is normal. Tricuspid regurgitation signal is inadequate for assessing PA pressure.  3. The mitral valve is normal in structure. No evidence of mitral valve regurgitation. No evidence of mitral stenosis.  4. The tricuspid valve is abnormal.  5. The aortic valve is tricuspid. Aortic valve regurgitation is not visualized. No aortic stenosis is present.  6. The inferior vena cava is dilated in size with <50% respiratory variability, suggesting right atrial pressure of 15 mmHg. FINDINGS  Left Ventricle: Hyperdynamic LV function creates mild intracavitary gradient, peak 17 mmHg. Left ventricular ejection fraction, by estimation, is 70 to 75%. The left ventricle has hyperdynamic function. The left ventricle has no regional wall motion abnormalities. The left ventricular internal cavity size was normal in size. There is no left ventricular hypertrophy. Left ventricular diastolic parameters are indeterminate. Right Ventricle: The right ventricular size is normal. Right  vetricular wall thickness was not well visualized. Right ventricular systolic function is normal. Tricuspid regurgitation signal is inadequate for assessing PA pressure. Left Atrium: Left atrial size was normal in size. Right Atrium: Right atrial size was normal in size. Pericardium: There is no evidence of pericardial effusion. Mitral Valve: The mitral valve is normal in structure. No evidence of mitral valve regurgitation. No evidence of mitral valve stenosis. Tricuspid Valve: The tricuspid valve is abnormal. Tricuspid valve regurgitation is mild . No evidence of tricuspid stenosis. Aortic Valve: The aortic valve is tricuspid. Aortic valve regurgitation is not visualized. No aortic stenosis is present. Aortic valve mean gradient measures 6.1 mmHg. Aortic valve peak gradient measures 13.7 mmHg. Aortic valve area, by VTI measures 2.74  cm. Pulmonic Valve: The pulmonic valve was not well visualized. Pulmonic valve regurgitation is not visualized. No evidence of pulmonic stenosis. Aorta: The aortic root and ascending aorta are structurally normal, with no evidence of dilitation. Venous: The inferior vena cava is dilated in size with less than 50% respiratory variability, suggesting right atrial pressure of 15 mmHg. IAS/Shunts: No atrial level shunt detected by color flow Doppler.  LEFT VENTRICLE PLAX 2D LVIDd:         3.90 cm   Diastology LVIDs:         2.30 cm   LV e' medial:    7.72 cm/s LV PW:         1.10 cm   LV E/e' medial:  14.8 LV IVS:        0.90 cm   LV e' lateral:   12.40 cm/s LVOT diam:  1.80 cm   LV E/e' lateral: 9.2 LV SV:         73 LV SV Index:   40 LVOT Area:     2.54 cm LV IVRT:       85 msec  RIGHT VENTRICLE             IVC RV S prime:     13.60 cm/s  IVC diam: 2.60 cm TAPSE (M-mode): 2.2 cm                             PULMONARY VEINS                             Diastolic Velocity: 86.20 cm/s                             S/D Velocity:       0.90                             Systolic Velocity:   80.70 cm/s LEFT ATRIUM             Index        RIGHT ATRIUM           Index LA diam:        3.80 cm 2.12 cm/m   RA Area:     10.20 cm LA Vol (A2C):   71.7 ml 39.96 ml/m  RA Volume:   20.10 ml  11.20 ml/m LA Vol (A4C):   45.9 ml 25.58 ml/m LA Biplane Vol: 57.6 ml 32.10 ml/m  AORTIC VALVE AV Area (Vmax):    1.85 cm AV Area (Vmean):   2.22 cm AV Area (VTI):     2.74 cm AV Vmax:           185.37 cm/s AV Vmean:          111.443 cm/s AV VTI:            0.265 m AV Peak Grad:      13.7 mmHg AV Mean Grad:      6.1 mmHg LVOT Vmax:         135.00 cm/s LVOT Vmean:        97.300 cm/s LVOT VTI:          0.285 m LVOT/AV VTI ratio: 1.08  AORTA Ao Root diam: 2.60 cm Ao Asc diam:  2.70 cm MITRAL VALVE MV Area (PHT): 3.89 cm     SHUNTS MV Decel Time: 195 msec     Systemic VTI:  0.29 m MV E velocity: 114.00 cm/s  Systemic Diam: 1.80 cm MV A velocity: 93.80 cm/s MV E/A ratio:  1.22 Dorn Ross MD Electronically signed by Dorn Ross MD Signature Date/Time: 11/21/2023/4:21:06 PM    Final    DG Chest Port 1 View Result Date: 11/21/2023 CLINICAL DATA:  10026 Shortness of breath 10026 EXAM: PORTABLE CHEST 1 VIEW COMPARISON:  June 28, 2023 FINDINGS: The cardiomediastinal silhouette is unchanged in contour. Trace LEFT pleural effusion. No pneumothorax. No acute pleuroparenchymal abnormality. IMPRESSION: Trace LEFT pleural effusion. Electronically Signed   By: Corean Salter M.D.   On: 11/21/2023 10:06   IR Tips Result Date: 11/19/2023 CLINICAL DATA:  73 year old with cirrhosis and refractory ascites. Patient requires frequent large volume paracentesis. MELD 3.0 = 15.  EXAM: 1. Ultrasound-guided paracentesis 2. Ultrasound-guided access of the right internal jugular vein 3. Ultrasound-guided access of the right common femoral vein 4. Hepatic venogram 5. Intravascular ultrasound 6. Catheterization of the portal vein 7. Portal venous and central manometry 8. Portal venogram 9. Creation of a transhepatic portal vein to  hepatic vein shunt MEDICATIONS: As antibiotic prophylaxis, Rocephin  2 g was ordered pre-procedure and administered intravenously within one hour of incision. ANESTHESIA/SEDATION: General - as administered by the Anesthesia department CONTRAST:  75 mL Omnipaque  300, intravenous FLUOROSCOPY TIME:  Radiation Exposure Index (as provided by the fluoroscopic device): 220 mGy Kerma COMPLICATIONS: None immediate. PROCEDURE: Informed written consent was obtained from the patient after a thorough discussion of the procedural risks, benefits and alternatives. All questions were addressed. Maximal Sterile Barrier Technique was utilized including caps, mask, sterile gowns, sterile gloves, sterile drape, hand hygiene and skin antiseptic. A timeout was performed prior to the initiation of the procedure. Dr. Wilkie Lent assisted with the procedure. Right abdomen, right groin and right neck were prepped and draped in sterile fashion. Ultrasound demonstrated a large pocket of fluid in the right abdomen. Ultrasound image was saved for documentation. Skin was anesthetized with 1% lidocaine . Using ultrasound guidance, paracentesis catheter was directed into the ascites. Approximately 5 L of yellow fluid was drained during the procedure. Paracentesis catheter was removed after the paracentesis. Bandage placed over the puncture site. Ultrasound confirmed a patent right common femoral vein. Using a combination of fluoroscopy and ultrasound, an access site was determined. A small dermatotomy was made at the planned puncture site. Using ultrasound guidance, access into the right common femoral vein was obtained with visualization of needle entry into the vessel using a standard micropuncture technique. Wire was advanced into the IVC. An 8 French vascular sheath was placed. Through this access site, an 82 French Accunav ICE catheter was advanced with ease under fluoroscopic guidance to the level of the intrahepatic inferior vena cava. A  preliminary ultrasound of the right neck was performed and demonstrates a patent internal jugular vein. A permanent ultrasound image was recorded. Using a combination of fluoroscopy and ultrasound, an access site was determined. A small dermatotomy was made at the planned puncture site. Using ultrasound guidance, access into the right internal jugular vein was obtained with visualization of needle entry into the vessel using a standard micropuncture technique. A wire was advanced into the IVC and serial fascial dilation performed. A 10 French tips sheath was placed into the internal jugular vein and advanced to the IVC. The jugular sheath was retracted into the right atrium and manometry was performed. A 5 French angled tip catheter was then directed into the middle hepatic vein. Hepatic venogram was performed. These images demonstrated a patent hepatic vein with no stenosis. The catheter was advanced to a wedge portion of the patent vein over which the 10 French sheath was advanced into the middle hepatic vein. Using ICE ultrasound visualization the catheter and middle hepatic vein as well as the portal anatomy was defined. A planned exit site from the hepatic vein and puncture site from the portal vein was placed into a single sonographic plane. Under direct ultrasound visualization, the ScorpionX needle was advanced into the portal venous system near the portal vein bifurcation. A Glidewire Advantage was then advanced into the splenic vein. A 5 French marking pigtail catheter was then advanced over the wire into the main portal vein. It was difficult to remove the wire from the pigtail catheter. It was noted that  the Glidewire Advantage coating had sheared off and was trapped within the pigtail catheter. We were unable to advance a microwire or microcatheter through the pigtail catheter. We attempted to snare the wire coating with micro snare devices but this was also unsuccessful. Back of a stiff Glidewire was  advanced into the pigtail catheter adjacent to the sheared off wire coating. The pigtail catheter was advanced further into the portal venous system until the wire was near the portal venous system. The stiff wire never exited the pigtail catheter. The pigtail catheter hub was cut. A 6 French Neuron MAX sheath was advanced over the pigtail catheter and through the 10 French sheath. The 6 French sheath was successfully advanced over the pigtail catheter and into the portal venous system using fluoroscopy. Once the 6 French sheath was within the portal venous system, the pigtail catheter was completely removed. Portal venogram was performed through the 6 French vascular sheath. Superstiff Amplatz wire was placed. A new pigtail catheter was placed. Portal manometry was then performed. The tract was then dilated to 8 mm with an 8 mm x 8 cm Athletis balloon. A 8-10 mm by 8 + 2 cm of Viatorr endograft was placed. This was ultimately dilated to 8 mm. After placement of the shunt, right atrial and portal pressures were repeated. Completion portal venogram demonstrates a patent TIPS endograft without significant gastroesophageal varices. The catheters and sheath were removed and manual compression was applied to the right internal jugular and right common femoral venous access sites until hemostasis was achieved. The patient was transferred to the PACU in stable condition. Pre-TIPS Mean Pressures (mmHg): Right atrium: 10 Portal vein: 39 Portosystemic gradient: 29 Post-TIPS Mean Pressures (mmHg): Right atrium:28 Portal vein: 34 Portosystemic gradient: 6 FINDINGS: Main portal vein and TIPS stent were widely patent at the end of the procedure. No significant filling of varices on the final portogram images. 5 L of ascites was removed. IMPRESSION: 1. Successful transjugular portosystemic shunt creation. 2. Portosystemic gradient of 29 mm Hg (absolute portal venous pressure 39 mm Hg) before shunt placement and 6 mm Hg (absolute  portal venous pressure 34 mm Hg) after shunt placement. PLAN: Patient will be admitted for overnight observation. Electronically Signed   By: Juliene Balder M.D.   On: 11/19/2023 16:42   IR US  Guide Vasc Access Right Result Date: 11/19/2023 CLINICAL DATA:  73 year old with cirrhosis and refractory ascites. Patient requires frequent large volume paracentesis. MELD 3.0 = 15. EXAM: 1. Ultrasound-guided paracentesis 2. Ultrasound-guided access of the right internal jugular vein 3. Ultrasound-guided access of the right common femoral vein 4. Hepatic venogram 5. Intravascular ultrasound 6. Catheterization of the portal vein 7. Portal venous and central manometry 8. Portal venogram 9. Creation of a transhepatic portal vein to hepatic vein shunt MEDICATIONS: As antibiotic prophylaxis, Rocephin  2 g was ordered pre-procedure and administered intravenously within one hour of incision. ANESTHESIA/SEDATION: General - as administered by the Anesthesia department CONTRAST:  75 mL Omnipaque  300, intravenous FLUOROSCOPY TIME:  Radiation Exposure Index (as provided by the fluoroscopic device): 220 mGy Kerma COMPLICATIONS: None immediate. PROCEDURE: Informed written consent was obtained from the patient after a thorough discussion of the procedural risks, benefits and alternatives. All questions were addressed. Maximal Sterile Barrier Technique was utilized including caps, mask, sterile gowns, sterile gloves, sterile drape, hand hygiene and skin antiseptic. A timeout was performed prior to the initiation of the procedure. Dr. Wilkie Lent assisted with the procedure. Right abdomen, right groin and right neck were prepped and  draped in sterile fashion. Ultrasound demonstrated a large pocket of fluid in the right abdomen. Ultrasound image was saved for documentation. Skin was anesthetized with 1% lidocaine . Using ultrasound guidance, paracentesis catheter was directed into the ascites. Approximately 5 L of yellow fluid was drained  during the procedure. Paracentesis catheter was removed after the paracentesis. Bandage placed over the puncture site. Ultrasound confirmed a patent right common femoral vein. Using a combination of fluoroscopy and ultrasound, an access site was determined. A small dermatotomy was made at the planned puncture site. Using ultrasound guidance, access into the right common femoral vein was obtained with visualization of needle entry into the vessel using a standard micropuncture technique. Wire was advanced into the IVC. An 8 French vascular sheath was placed. Through this access site, an 53 French Accunav ICE catheter was advanced with ease under fluoroscopic guidance to the level of the intrahepatic inferior vena cava. A preliminary ultrasound of the right neck was performed and demonstrates a patent internal jugular vein. A permanent ultrasound image was recorded. Using a combination of fluoroscopy and ultrasound, an access site was determined. A small dermatotomy was made at the planned puncture site. Using ultrasound guidance, access into the right internal jugular vein was obtained with visualization of needle entry into the vessel using a standard micropuncture technique. A wire was advanced into the IVC and serial fascial dilation performed. A 10 French tips sheath was placed into the internal jugular vein and advanced to the IVC. The jugular sheath was retracted into the right atrium and manometry was performed. A 5 French angled tip catheter was then directed into the middle hepatic vein. Hepatic venogram was performed. These images demonstrated a patent hepatic vein with no stenosis. The catheter was advanced to a wedge portion of the patent vein over which the 10 French sheath was advanced into the middle hepatic vein. Using ICE ultrasound visualization the catheter and middle hepatic vein as well as the portal anatomy was defined. A planned exit site from the hepatic vein and puncture site from the portal  vein was placed into a single sonographic plane. Under direct ultrasound visualization, the ScorpionX needle was advanced into the portal venous system near the portal vein bifurcation. A Glidewire Advantage was then advanced into the splenic vein. A 5 French marking pigtail catheter was then advanced over the wire into the main portal vein. It was difficult to remove the wire from the pigtail catheter. It was noted that the [facility] Advantage coating had sheared off and was trapped within the pigtail catheter. We were unable to advance a microwire or microcatheter through the pigtail catheter. We attempted to snare the wire coating with micro snare devices but this was also unsuccessful. Back of a stiff Glidewire was advanced into the pigtail catheter adjacent to the sheared off wire coating. The pigtail catheter was advanced further into the portal venous system until the wire was near the portal venous system. The stiff wire never exited the pigtail catheter. The pigtail catheter hub was cut. A 6 French Neuron MAX sheath was advanced over the pigtail catheter and through the 10 French sheath. The 6 French sheath was successfully advanced over the pigtail catheter and into the portal venous system using fluoroscopy. Once the 6 French sheath was within the portal venous system, the pigtail catheter was completely removed. Portal venogram was performed through the 6 French vascular sheath. Superstiff Amplatz wire was placed. A new pigtail catheter was placed. Portal manometry was then performed. The tract was  then dilated to 8 mm with an 8 mm x 8 cm Athletis balloon. A 8-10 mm by 8 + 2 cm of Viatorr endograft was placed. This was ultimately dilated to 8 mm. After placement of the shunt, right atrial and portal pressures were repeated. Completion portal venogram demonstrates a patent TIPS endograft without significant gastroesophageal varices. The catheters and sheath were removed and manual compression was applied  to the right internal jugular and right common femoral venous access sites until hemostasis was achieved. The patient was transferred to the PACU in stable condition. Pre-TIPS Mean Pressures (mmHg): Right atrium: 10 Portal vein: 39 Portosystemic gradient: 29 Post-TIPS Mean Pressures (mmHg): Right atrium:28 Portal vein: 34 Portosystemic gradient: 6 FINDINGS: Main portal vein and TIPS stent were widely patent at the end of the procedure. No significant filling of varices on the final portogram images. 5 L of ascites was removed. IMPRESSION: 1. Successful transjugular portosystemic shunt creation. 2. Portosystemic gradient of 29 mm Hg (absolute portal venous pressure 39 mm Hg) before shunt placement and 6 mm Hg (absolute portal venous pressure 34 mm Hg) after shunt placement. PLAN: Patient will be admitted for overnight observation. Electronically Signed   By: Juliene Balder M.D.   On: 11/19/2023 16:42   IR US  Guide Vasc Access Right Result Date: 11/19/2023 CLINICAL DATA:  73 year old with cirrhosis and refractory ascites. Patient requires frequent large volume paracentesis. MELD 3.0 = 15. EXAM: 1. Ultrasound-guided paracentesis 2. Ultrasound-guided access of the right internal jugular vein 3. Ultrasound-guided access of the right common femoral vein 4. Hepatic venogram 5. Intravascular ultrasound 6. Catheterization of the portal vein 7. Portal venous and central manometry 8. Portal venogram 9. Creation of a transhepatic portal vein to hepatic vein shunt MEDICATIONS: As antibiotic prophylaxis, Rocephin  2 g was ordered pre-procedure and administered intravenously within one hour of incision. ANESTHESIA/SEDATION: General - as administered by the Anesthesia department CONTRAST:  75 mL Omnipaque  300, intravenous FLUOROSCOPY TIME:  Radiation Exposure Index (as provided by the fluoroscopic device): 220 mGy Kerma COMPLICATIONS: None immediate. PROCEDURE: Informed written consent was obtained from the patient after a thorough  discussion of the procedural risks, benefits and alternatives. All questions were addressed. Maximal Sterile Barrier Technique was utilized including caps, mask, sterile gowns, sterile gloves, sterile drape, hand hygiene and skin antiseptic. A timeout was performed prior to the initiation of the procedure. Dr. Wilkie Lent assisted with the procedure. Right abdomen, right groin and right neck were prepped and draped in sterile fashion. Ultrasound demonstrated a large pocket of fluid in the right abdomen. Ultrasound image was saved for documentation. Skin was anesthetized with 1% lidocaine . Using ultrasound guidance, paracentesis catheter was directed into the ascites. Approximately 5 L of yellow fluid was drained during the procedure. Paracentesis catheter was removed after the paracentesis. Bandage placed over the puncture site. Ultrasound confirmed a patent right common femoral vein. Using a combination of fluoroscopy and ultrasound, an access site was determined. A small dermatotomy was made at the planned puncture site. Using ultrasound guidance, access into the right common femoral vein was obtained with visualization of needle entry into the vessel using a standard micropuncture technique. Wire was advanced into the IVC. An 8 French vascular sheath was placed. Through this access site, an 26 French Accunav ICE catheter was advanced with ease under fluoroscopic guidance to the level of the intrahepatic inferior vena cava. A preliminary ultrasound of the right neck was performed and demonstrates a patent internal jugular vein. A permanent ultrasound image was recorded. Using  a combination of fluoroscopy and ultrasound, an access site was determined. A small dermatotomy was made at the planned puncture site. Using ultrasound guidance, access into the right internal jugular vein was obtained with visualization of needle entry into the vessel using a standard micropuncture technique. A wire was advanced into the  IVC and serial fascial dilation performed. A 10 French tips sheath was placed into the internal jugular vein and advanced to the IVC. The jugular sheath was retracted into the right atrium and manometry was performed. A 5 French angled tip catheter was then directed into the middle hepatic vein. Hepatic venogram was performed. These images demonstrated a patent hepatic vein with no stenosis. The catheter was advanced to a wedge portion of the patent vein over which the 10 French sheath was advanced into the middle hepatic vein. Using ICE ultrasound visualization the catheter and middle hepatic vein as well as the portal anatomy was defined. A planned exit site from the hepatic vein and puncture site from the portal vein was placed into a single sonographic plane. Under direct ultrasound visualization, the ScorpionX needle was advanced into the portal venous system near the portal vein bifurcation. A Glidewire Advantage was then advanced into the splenic vein. A 5 French marking pigtail catheter was then advanced over the wire into the main portal vein. It was difficult to remove the wire from the pigtail catheter. It was noted that the [facility] Advantage coating had sheared off and was trapped within the pigtail catheter. We were unable to advance a microwire or microcatheter through the pigtail catheter. We attempted to snare the wire coating with micro snare devices but this was also unsuccessful. Back of a stiff Glidewire was advanced into the pigtail catheter adjacent to the sheared off wire coating. The pigtail catheter was advanced further into the portal venous system until the wire was near the portal venous system. The stiff wire never exited the pigtail catheter. The pigtail catheter hub was cut. A 6 French Neuron MAX sheath was advanced over the pigtail catheter and through the 10 French sheath. The 6 French sheath was successfully advanced over the pigtail catheter and into the portal venous system  using fluoroscopy. Once the 6 French sheath was within the portal venous system, the pigtail catheter was completely removed. Portal venogram was performed through the 6 French vascular sheath. Superstiff Amplatz wire was placed. A new pigtail catheter was placed. Portal manometry was then performed. The tract was then dilated to 8 mm with an 8 mm x 8 cm Athletis balloon. A 8-10 mm by 8 + 2 cm of Viatorr endograft was placed. This was ultimately dilated to 8 mm. After placement of the shunt, right atrial and portal pressures were repeated. Completion portal venogram demonstrates a patent TIPS endograft without significant gastroesophageal varices. The catheters and sheath were removed and manual compression was applied to the right internal jugular and right common femoral venous access sites until hemostasis was achieved. The patient was transferred to the PACU in stable condition. Pre-TIPS Mean Pressures (mmHg): Right atrium: 10 Portal vein: 39 Portosystemic gradient: 29 Post-TIPS Mean Pressures (mmHg): Right atrium:28 Portal vein: 34 Portosystemic gradient: 6 FINDINGS: Main portal vein and TIPS stent were widely patent at the end of the procedure. No significant filling of varices on the final portogram images. 5 L of ascites was removed. IMPRESSION: 1. Successful transjugular portosystemic shunt creation. 2. Portosystemic gradient of 29 mm Hg (absolute portal venous pressure 39 mm Hg) before shunt placement and 6  mm Hg (absolute portal venous pressure 34 mm Hg) after shunt placement. PLAN: Patient will be admitted for overnight observation. Electronically Signed   By: Juliene Balder M.D.   On: 11/19/2023 16:42   IR Paracentesis Result Date: 11/19/2023 CLINICAL DATA:  73 year old with cirrhosis and refractory ascites. Patient requires frequent large volume paracentesis. MELD 3.0 = 15. EXAM: 1. Ultrasound-guided paracentesis 2. Ultrasound-guided access of the right internal jugular vein 3. Ultrasound-guided access  of the right common femoral vein 4. Hepatic venogram 5. Intravascular ultrasound 6. Catheterization of the portal vein 7. Portal venous and central manometry 8. Portal venogram 9. Creation of a transhepatic portal vein to hepatic vein shunt MEDICATIONS: As antibiotic prophylaxis, Rocephin  2 g was ordered pre-procedure and administered intravenously within one hour of incision. ANESTHESIA/SEDATION: General - as administered by the Anesthesia department CONTRAST:  75 mL Omnipaque  300, intravenous FLUOROSCOPY TIME:  Radiation Exposure Index (as provided by the fluoroscopic device): 220 mGy Kerma COMPLICATIONS: None immediate. PROCEDURE: Informed written consent was obtained from the patient after a thorough discussion of the procedural risks, benefits and alternatives. All questions were addressed. Maximal Sterile Barrier Technique was utilized including caps, mask, sterile gowns, sterile gloves, sterile drape, hand hygiene and skin antiseptic. A timeout was performed prior to the initiation of the procedure. Dr. Wilkie Lent assisted with the procedure. Right abdomen, right groin and right neck were prepped and draped in sterile fashion. Ultrasound demonstrated a large pocket of fluid in the right abdomen. Ultrasound image was saved for documentation. Skin was anesthetized with 1% lidocaine . Using ultrasound guidance, paracentesis catheter was directed into the ascites. Approximately 5 L of yellow fluid was drained during the procedure. Paracentesis catheter was removed after the paracentesis. Bandage placed over the puncture site. Ultrasound confirmed a patent right common femoral vein. Using a combination of fluoroscopy and ultrasound, an access site was determined. A small dermatotomy was made at the planned puncture site. Using ultrasound guidance, access into the right common femoral vein was obtained with visualization of needle entry into the vessel using a standard micropuncture technique. Wire was  advanced into the IVC. An 8 French vascular sheath was placed. Through this access site, an 5 French Accunav ICE catheter was advanced with ease under fluoroscopic guidance to the level of the intrahepatic inferior vena cava. A preliminary ultrasound of the right neck was performed and demonstrates a patent internal jugular vein. A permanent ultrasound image was recorded. Using a combination of fluoroscopy and ultrasound, an access site was determined. A small dermatotomy was made at the planned puncture site. Using ultrasound guidance, access into the right internal jugular vein was obtained with visualization of needle entry into the vessel using a standard micropuncture technique. A wire was advanced into the IVC and serial fascial dilation performed. A 10 French tips sheath was placed into the internal jugular vein and advanced to the IVC. The jugular sheath was retracted into the right atrium and manometry was performed. A 5 French angled tip catheter was then directed into the middle hepatic vein. Hepatic venogram was performed. These images demonstrated a patent hepatic vein with no stenosis. The catheter was advanced to a wedge portion of the patent vein over which the 10 French sheath was advanced into the middle hepatic vein. Using ICE ultrasound visualization the catheter and middle hepatic vein as well as the portal anatomy was defined. A planned exit site from the hepatic vein and puncture site from the portal vein was placed into a single sonographic plane. Under  direct ultrasound visualization, the ScorpionX needle was advanced into the portal venous system near the portal vein bifurcation. A Glidewire Advantage was then advanced into the splenic vein. A 5 French marking pigtail catheter was then advanced over the wire into the main portal vein. It was difficult to remove the wire from the pigtail catheter. It was noted that the [facility] Advantage coating had sheared off and was trapped within the  pigtail catheter. We were unable to advance a microwire or microcatheter through the pigtail catheter. We attempted to snare the wire coating with micro snare devices but this was also unsuccessful. Back of a stiff Glidewire was advanced into the pigtail catheter adjacent to the sheared off wire coating. The pigtail catheter was advanced further into the portal venous system until the wire was near the portal venous system. The stiff wire never exited the pigtail catheter. The pigtail catheter hub was cut. A 6 French Neuron MAX sheath was advanced over the pigtail catheter and through the 10 French sheath. The 6 French sheath was successfully advanced over the pigtail catheter and into the portal venous system using fluoroscopy. Once the 6 French sheath was within the portal venous system, the pigtail catheter was completely removed. Portal venogram was performed through the 6 French vascular sheath. Superstiff Amplatz wire was placed. A new pigtail catheter was placed. Portal manometry was then performed. The tract was then dilated to 8 mm with an 8 mm x 8 cm Athletis balloon. A 8-10 mm by 8 + 2 cm of Viatorr endograft was placed. This was ultimately dilated to 8 mm. After placement of the shunt, right atrial and portal pressures were repeated. Completion portal venogram demonstrates a patent TIPS endograft without significant gastroesophageal varices. The catheters and sheath were removed and manual compression was applied to the right internal jugular and right common femoral venous access sites until hemostasis was achieved. The patient was transferred to the PACU in stable condition. Pre-TIPS Mean Pressures (mmHg): Right atrium: 10 Portal vein: 39 Portosystemic gradient: 29 Post-TIPS Mean Pressures (mmHg): Right atrium:28 Portal vein: 34 Portosystemic gradient: 6 FINDINGS: Main portal vein and TIPS stent were widely patent at the end of the procedure. No significant filling of varices on the final portogram  images. 5 L of ascites was removed. IMPRESSION: 1. Successful transjugular portosystemic shunt creation. 2. Portosystemic gradient of 29 mm Hg (absolute portal venous pressure 39 mm Hg) before shunt placement and 6 mm Hg (absolute portal venous pressure 34 mm Hg) after shunt placement. PLAN: Patient will be admitted for overnight observation. Electronically Signed   By: Juliene Balder M.D.   On: 11/19/2023 16:42   IR INTRAVASCULAR ULTRASOUND NON CORONARY Result Date: 11/19/2023 CLINICAL DATA:  73 year old with cirrhosis and refractory ascites. Patient requires frequent large volume paracentesis. MELD 3.0 = 15. EXAM: 1. Ultrasound-guided paracentesis 2. Ultrasound-guided access of the right internal jugular vein 3. Ultrasound-guided access of the right common femoral vein 4. Hepatic venogram 5. Intravascular ultrasound 6. Catheterization of the portal vein 7. Portal venous and central manometry 8. Portal venogram 9. Creation of a transhepatic portal vein to hepatic vein shunt MEDICATIONS: As antibiotic prophylaxis, Rocephin  2 g was ordered pre-procedure and administered intravenously within one hour of incision. ANESTHESIA/SEDATION: General - as administered by the Anesthesia department CONTRAST:  75 mL Omnipaque  300, intravenous FLUOROSCOPY TIME:  Radiation Exposure Index (as provided by the fluoroscopic device): 220 mGy Kerma COMPLICATIONS: None immediate. PROCEDURE: Informed written consent was obtained from the patient after a thorough discussion  of the procedural risks, benefits and alternatives. All questions were addressed. Maximal Sterile Barrier Technique was utilized including caps, mask, sterile gowns, sterile gloves, sterile drape, hand hygiene and skin antiseptic. A timeout was performed prior to the initiation of the procedure. Dr. Wilkie Lent assisted with the procedure. Right abdomen, right groin and right neck were prepped and draped in sterile fashion. Ultrasound demonstrated a large pocket of  fluid in the right abdomen. Ultrasound image was saved for documentation. Skin was anesthetized with 1% lidocaine . Using ultrasound guidance, paracentesis catheter was directed into the ascites. Approximately 5 L of yellow fluid was drained during the procedure. Paracentesis catheter was removed after the paracentesis. Bandage placed over the puncture site. Ultrasound confirmed a patent right common femoral vein. Using a combination of fluoroscopy and ultrasound, an access site was determined. A small dermatotomy was made at the planned puncture site. Using ultrasound guidance, access into the right common femoral vein was obtained with visualization of needle entry into the vessel using a standard micropuncture technique. Wire was advanced into the IVC. An 8 French vascular sheath was placed. Through this access site, an 55 French Accunav ICE catheter was advanced with ease under fluoroscopic guidance to the level of the intrahepatic inferior vena cava. A preliminary ultrasound of the right neck was performed and demonstrates a patent internal jugular vein. A permanent ultrasound image was recorded. Using a combination of fluoroscopy and ultrasound, an access site was determined. A small dermatotomy was made at the planned puncture site. Using ultrasound guidance, access into the right internal jugular vein was obtained with visualization of needle entry into the vessel using a standard micropuncture technique. A wire was advanced into the IVC and serial fascial dilation performed. A 10 French tips sheath was placed into the internal jugular vein and advanced to the IVC. The jugular sheath was retracted into the right atrium and manometry was performed. A 5 French angled tip catheter was then directed into the middle hepatic vein. Hepatic venogram was performed. These images demonstrated a patent hepatic vein with no stenosis. The catheter was advanced to a wedge portion of the patent vein over which the 10 French  sheath was advanced into the middle hepatic vein. Using ICE ultrasound visualization the catheter and middle hepatic vein as well as the portal anatomy was defined. A planned exit site from the hepatic vein and puncture site from the portal vein was placed into a single sonographic plane. Under direct ultrasound visualization, the ScorpionX needle was advanced into the portal venous system near the portal vein bifurcation. A Glidewire Advantage was then advanced into the splenic vein. A 5 French marking pigtail catheter was then advanced over the wire into the main portal vein. It was difficult to remove the wire from the pigtail catheter. It was noted that the [facility] Advantage coating had sheared off and was trapped within the pigtail catheter. We were unable to advance a microwire or microcatheter through the pigtail catheter. We attempted to snare the wire coating with micro snare devices but this was also unsuccessful. Back of a stiff Glidewire was advanced into the pigtail catheter adjacent to the sheared off wire coating. The pigtail catheter was advanced further into the portal venous system until the wire was near the portal venous system. The stiff wire never exited the pigtail catheter. The pigtail catheter hub was cut. A 6 French Neuron MAX sheath was advanced over the pigtail catheter and through the 10 French sheath. The 6 French sheath was successfully  advanced over the pigtail catheter and into the portal venous system using fluoroscopy. Once the 6 French sheath was within the portal venous system, the pigtail catheter was completely removed. Portal venogram was performed through the 6 French vascular sheath. Superstiff Amplatz wire was placed. A new pigtail catheter was placed. Portal manometry was then performed. The tract was then dilated to 8 mm with an 8 mm x 8 cm Athletis balloon. A 8-10 mm by 8 + 2 cm of Viatorr endograft was placed. This was ultimately dilated to 8 mm. After placement of  the shunt, right atrial and portal pressures were repeated. Completion portal venogram demonstrates a patent TIPS endograft without significant gastroesophageal varices. The catheters and sheath were removed and manual compression was applied to the right internal jugular and right common femoral venous access sites until hemostasis was achieved. The patient was transferred to the PACU in stable condition. Pre-TIPS Mean Pressures (mmHg): Right atrium: 10 Portal vein: 39 Portosystemic gradient: 29 Post-TIPS Mean Pressures (mmHg): Right atrium:28 Portal vein: 34 Portosystemic gradient: 6 FINDINGS: Main portal vein and TIPS stent were widely patent at the end of the procedure. No significant filling of varices on the final portogram images. 5 L of ascites was removed. IMPRESSION: 1. Successful transjugular portosystemic shunt creation. 2. Portosystemic gradient of 29 mm Hg (absolute portal venous pressure 39 mm Hg) before shunt placement and 6 mm Hg (absolute portal venous pressure 34 mm Hg) after shunt placement. PLAN: Patient will be admitted for overnight observation. Electronically Signed   By: Juliene Balder M.D.   On: 11/19/2023 16:42   US  Paracentesis Result Date: 11/17/2023 INDICATION: Patient with a history of cirrhosis with recurrent ascites. Interventional Radiology asked to perform a diagnostic and therapeutic paracentesis. 5 L max EXAM: ULTRASOUND GUIDED PARACENTESIS MEDICATIONS: 1% lidocaine  10 mL COMPLICATIONS: None immediate. PROCEDURE: Informed written consent was obtained from the patient after a discussion of the risks, benefits and alternatives to treatment. A timeout was performed prior to the initiation of the procedure. Initial ultrasound scanning demonstrates a large amount of ascites within the left lower abdominal quadrant. The left lower abdomen was prepped and draped in the usual sterile fashion. 1% lidocaine  was used for local anesthesia. Following this, a 19 gauge, 7-cm, Yueh catheter was  introduced. An ultrasound image was saved for documentation purposes. The paracentesis was performed. The catheter was removed and a dressing was applied. The patient tolerated the procedure well without immediate post procedural complication. Patient received post-procedure intravenous albumin ; see nursing notes for details. FINDINGS: A total of approximately 5 L of clear yellow fluid was removed. Samples were sent to the laboratory as requested by the clinical team. IMPRESSION: Successful ultrasound-guided paracentesis yielding 5 liters of peritoneal fluid. Procedure performed by: Warren Dais, NP PLAN: The patient is scheduled for TIPS procedure November 19, 2023 Electronically Signed   By: Ester Sides M.D.   On: 11/17/2023 11:33    Alm Schneider, DO  [facility]  If 7PM-7AM, please contact night-coverage [email or web address] Password [email or web address] 12/12/2023, 4:01 PM   LOS: 2 days

## 2023-12-12 NOTE — Discharge Summary (Signed)
 Physician Discharge Summary   Patient: Andrea Dickson MRN: [id number] DOB: 1950-03-01  Admit date:     12/10/2023  Discharge date: 12/13/2023  Discharge Physician: Alm Trampus Mcquerry   PCP: Lari Elspeth BRAVO, MD   Recommendations at discharge:   Please follow up with primary care provider within 1-2 weeks  Please repeat BMP and CBC in one week   Hospital Course: 73 year old female with a history of NASH cirrhosis, former smoker, diabetes mellitus type 2, hypertension, hyperlipidemia, bipolar disorder presenting with altered mental status.  The patient had a recent hospitalization from 11/19/2023 to 11/23/2023 when she was admitted for TIPS procedure which she underwent on 11/19/2023.  She subsequently developed fluid overload which was felt to be secondary decompensated liver cirrhosis and acute HFpEF.  She was diuresed and transition to furosemide  40 mg daily and spironolactone  100 mg daily.  The patient developed chest pain which was felt to be MSK etiology.  Echocardiogram showed EF 70 to 75% with no WMA.  She was discharged home with home health physical therapy.  After discharge from the hospital on 11/23/2023, the patient stopped taking her lactulose .  The patient subsequently developed increasing confusion for the past 3 days.  The patient's son discovered that the patient had not been taking her lactulose  and restarted it on the day prior to admission.  However the patient remained confused.  As result the patient was brought to the hospital for further evaluation treatment. Patient denies fevers, chills, headache, chest pain, dyspnea, nausea, vomiting, diarrhea, abdominal pain, dysuria, hematuria, hematochezia, and melena.   In the ED, the patient was afebrile and hemodynamically stable with oxygen saturation 100% room air. WBC 4.2, hemoglobin 10.2, platelets 100.  Sodium 141, potassium 3.3, bicarbonate 20, serum creatinine 1.29.  Ammonia 72.  The patient was started on  lactulose .  Assessment and Plan: Hepatic encephalopathy - Presented with ammonia 72 and altered mental status - 12/11/2023--patient remains somnolent, but follows commands and speaks fluently--drifts back to sleep if not stimulated - Continue lactulose  30 g 3 times daily - UA negative for pyuria - CT brain negative - B12--665 - TSH--0.978 - Folate--19.9 - 11/30--mental status improving, near baseline - ammonia 51 at time of dc   NASH Liver Cirrohsis, decompensated -hold lasix  and spironolactone  temporarily with lactic acidosis -Status post TIPS 11/19/2023 - Monitor BMP - restart lasix  and spiro after d/c   Lactic acidosis -Likely secondary to delayed clearance from hepatic dysfunction - Blood cultures x 2 sets--neg to date - UA negative for pyuria - Personally reviewed chest x-ray--no infiltrates or edema - started IVF>>improved   CKD stage IIIa - Baseline creatinine 1.0-1.2 - Monitor with diuretics   Bipolar disorder - Continue Wellbutrin  - Holding Remeron  temporarily>>restart after dc - her inderal  dose was decreased due to soft BPs--son and pt notified and agreed   Mixed hyperlipidemia - Continue Zetia    Diabetes mellitus type 2 - Not on any agents in the outpatient setting - 11/29 hemoglobin A1c--5.1   Hypokalemia -repleted -check mag 2.0   Chronic HFpEF -compensated - 11/21/2023 echo EF 70 to 75%, no WMA, normal RV       Consultants: none Procedures performed: none  Disposition: Home Diet recommendation:  Cardiac diet DISCHARGE MEDICATION: Allergies as of 12/13/2023       Reactions   Pramipexole Other (See Comments)   Shaking, palpitations, headache, faint feeling   Crestor [rosuvastatin Calcium ] Other (See Comments)   Muscle pain   Bee Pollen Other (See Comments)   Eyes  and nose run   Pollen Extract Other (See Comments)   Eyes and nose run        Medication List     STOP taking these medications    propranolol  ER 80 MG 24 hr  capsule Commonly known as: Inderal  LA Replaced by: propranolol  10 MG tablet       TAKE these medications    acetaminophen  500 MG tablet Commonly known as: TYLENOL  Take 1,000 mg by mouth 2 (two) times daily as needed for moderate pain or headache.   buPROPion  150 MG 24 hr tablet Commonly known as: Wellbutrin  XL Take 1 tablet (150 mg total) by mouth every morning.   Culturelle Digestive Health Caps Take 1 capsule by mouth daily.   diazepam  2 MG tablet Commonly known as: Valium  Take 1 tablet (2 mg total) by mouth daily as needed for anxiety. What changed:  when to take this reasons to take this   estrogens  (conjugated) 0.625 MG tablet Commonly known as: PREMARIN  Take 0.625 mg by mouth daily.   ezetimibe  10 MG tablet Commonly known as: ZETIA  Take 10 mg by mouth at bedtime.   fluticasone 50 MCG/ACT nasal spray Commonly known as: FLONASE Place 2 sprays into both nostrils daily as needed for rhinitis or allergies.   furosemide  40 MG tablet Commonly known as: Lasix  Take 1 tablet (40 mg total) by mouth daily.   hyoscyamine  0.125 MG SL tablet Commonly known as: LEVSIN  SL Place 1 tablet (0.125 mg total) under the tongue every 6 (six) hours as needed for cramping.   lactulose  10 GM/15ML solution Commonly known as: CHRONULAC  Take 45 mLs (30 g total) by mouth in the morning and at bedtime.   mirtazapine  15 MG tablet Commonly known as: REMERON  Take 1 tablet (15 mg total) by mouth at bedtime.   nitroGLYCERIN  0.4 MG SL tablet Commonly known as: NITROSTAT  Place 1 tablet (0.4 mg total) under the tongue every 5 (five) minutes as needed for chest pain.   omeprazole  40 MG capsule Commonly known as: PRILOSEC TAKE ONE (1) CAPSULE BY MOUTH TWICE DAILY AT BREAKFAST AND AT BEDTIME What changed: See the new instructions.   ondansetron  4 MG tablet Commonly known as: ZOFRAN  TAKE 1 TABLET BY MOUTH EVERY 8 HOURS AS NEEDED FOR NAUSEA & VOMITING What changed: reasons to take this    propranolol  10 MG tablet Commonly known as: INDERAL  Take 1 tablet (10 mg total) by mouth 2 (two) times daily. Replaces: propranolol  ER 80 MG 24 hr capsule   spironolactone  100 MG tablet Commonly known as: Aldactone  Take 1 tablet (100 mg total) by mouth daily.   trimethoprim  100 MG tablet Commonly known as: TRIMPEX  TAKE 1 TABLET BY MOUTH EVERY DAY AT BEDTIME   Vitamin D3 50 MCG (2000 UT) Tabs Take 2,000 Units by mouth daily.   Zenpep  [id number] units Cpep Generic drug: Pancrelipase  (Lip-Prot-Amyl) Take 1 capsule by mouth 3 (three) times daily with meals.        Discharge Exam: Filed Weights   12/10/23 1641  Weight: 66.2 kg   HEENT:  [place]/AT, No thrush, no icterus CV:  RRR, no rub, no S3, no S4 Lung:  CTA, no wheeze, no rhonchi Abd:  soft/+BS, NT Ext:  No edema, no lymphangitis, no synovitis, no rash   Condition at discharge: stable  The results of significant diagnostics from this hospitalization (including imaging, microbiology, ancillary and laboratory) are listed below for reference.   Imaging Studies: DG Chest Portable 1 View Result Date: 12/10/2023 CLINICAL  DATA:  Fall EXAM: PORTABLE CHEST 1 VIEW COMPARISON:  Chest x-ray 11/21/2023 FINDINGS: The heart size and mediastinal contours are within normal limits. Both lungs are clear. The visualized skeletal structures are unremarkable. IMPRESSION: No active disease. Electronically Signed   By: Greig Pique M.D.   On: 12/10/2023 19:10   DG Knee Complete 4 Views Left Result Date: 12/10/2023 CLINICAL DATA:  Fall EXAM: LEFT KNEE - COMPLETE 4+ VIEW COMPARISON:  None Available. FINDINGS: No evidence of fracture, dislocation, or joint effusion. There are mild degenerative changes of the knee. There is medial and lateral compartment chondrocalcinosis. Soft tissues are unremarkable. IMPRESSION: 1. No acute fracture or dislocation. 2. Mild degenerative changes of the knee. Electronically Signed   By: Greig Pique M.D.   On:  12/10/2023 19:10   CT Head Wo Contrast Result Date: 12/10/2023 EXAM: CT HEAD WITHOUT CONTRAST 12/10/2023 06:40:46 PM TECHNIQUE: CT of the head was performed without the administration of intravenous contrast. Automated exposure control, iterative reconstruction, and/or weight based adjustment of the mA/kV was utilized to reduce the radiation dose to as low as reasonably achievable. COMPARISON: CT head 06/20/2022. CLINICAL HISTORY: Head trauma, minor (Age >= 65y). FINDINGS: BRAIN AND VENTRICLES: No acute hemorrhage. No evidence of acute infarct. No hydrocephalus. No extra-axial collection. No mass effect or midline shift. ORBITS: Bilateral lens replacements. SINUSES: No acute abnormality. SOFT TISSUES AND SKULL: Left frontal scalp hematoma. Similar small exostosis along the outer table of the right frontal calvarium likely reflecting a small osteoma. No skull fracture. IMPRESSION: 1. No acute intracranial abnormality. 2. Left frontal scalp hematoma. Electronically signed by: Donnice Mania MD 12/10/2023 06:54 PM EST RP Workstation: [id number]   ECHOCARDIOGRAM COMPLETE Result Date: 11/21/2023    ECHOCARDIOGRAM REPORT   Patient Name:   CARITA SOLLARS Date of Exam: 11/21/2023 Medical Rec #:  [id number]          Height:       66.0 in Accession #:    [id number]         Weight:       155.0 lb Date of Birth:  1950-01-25          BSA:          1.794 m Patient Age:    73 years           BP:           115/51 mmHg Patient Gender: F                  HR:           83 bpm. Exam Location:  Inpatient Procedure: 2D Echo, Color Doppler and Cardiac Doppler (Both Spectral and Color            Flow Doppler were utilized during procedure). Indications:    Dyspnea R06.00  History:        Patient has prior history of Echocardiogram examinations, most                 recent 08/31/2023. Risk Factors:Dyslipidemia and Diabetes.  Sonographer:    Tinnie Gosling RDCS Referring Phys: [id number] DORN FALCON BRANCH IMPRESSIONS  1. Left ventricular  ejection fraction, by estimation, is 70 to 75%. The left ventricle has hyperdynamic function. The left ventricle has no regional wall motion abnormalities. Left ventricular diastolic parameters are indeterminate.  2. Right ventricular systolic function is normal. The right ventricular size is normal. Tricuspid regurgitation signal is inadequate for assessing PA pressure.  3. The mitral valve  is normal in structure. No evidence of mitral valve regurgitation. No evidence of mitral stenosis.  4. The tricuspid valve is abnormal.  5. The aortic valve is tricuspid. Aortic valve regurgitation is not visualized. No aortic stenosis is present.  6. The inferior vena cava is dilated in size with <50% respiratory variability, suggesting right atrial pressure of 15 mmHg. FINDINGS  Left Ventricle: Hyperdynamic LV function creates mild intracavitary gradient, peak 17 mmHg. Left ventricular ejection fraction, by estimation, is 70 to 75%. The left ventricle has hyperdynamic function. The left ventricle has no regional wall motion abnormalities. The left ventricular internal cavity size was normal in size. There is no left ventricular hypertrophy. Left ventricular diastolic parameters are indeterminate. Right Ventricle: The right ventricular size is normal. Right vetricular wall thickness was not well visualized. Right ventricular systolic function is normal. Tricuspid regurgitation signal is inadequate for assessing PA pressure. Left Atrium: Left atrial size was normal in size. Right Atrium: Right atrial size was normal in size. Pericardium: There is no evidence of pericardial effusion. Mitral Valve: The mitral valve is normal in structure. No evidence of mitral valve regurgitation. No evidence of mitral valve stenosis. Tricuspid Valve: The tricuspid valve is abnormal. Tricuspid valve regurgitation is mild . No evidence of tricuspid stenosis. Aortic Valve: The aortic valve is tricuspid. Aortic valve regurgitation is not visualized.  No aortic stenosis is present. Aortic valve mean gradient measures 6.1 mmHg. Aortic valve peak gradient measures 13.7 mmHg. Aortic valve area, by VTI measures 2.74  cm. Pulmonic Valve: The pulmonic valve was not well visualized. Pulmonic valve regurgitation is not visualized. No evidence of pulmonic stenosis. Aorta: The aortic root and ascending aorta are structurally normal, with no evidence of dilitation. Venous: The inferior vena cava is dilated in size with less than 50% respiratory variability, suggesting right atrial pressure of 15 mmHg. IAS/Shunts: No atrial level shunt detected by color flow Doppler.  LEFT VENTRICLE PLAX 2D LVIDd:         3.90 cm   Diastology LVIDs:         2.30 cm   LV e' medial:    7.72 cm/s LV PW:         1.10 cm   LV E/e' medial:  14.8 LV IVS:        0.90 cm   LV e' lateral:   12.40 cm/s LVOT diam:     1.80 cm   LV E/e' lateral: 9.2 LV SV:         73 LV SV Index:   40 LVOT Area:     2.54 cm LV IVRT:       85 msec  RIGHT VENTRICLE             IVC RV S prime:     13.60 cm/s  IVC diam: 2.60 cm TAPSE (M-mode): 2.2 cm                             PULMONARY VEINS                             Diastolic Velocity: 86.20 cm/s                             S/D Velocity:       0.90  Systolic Velocity:  80.70 cm/s LEFT ATRIUM             Index        RIGHT ATRIUM           Index LA diam:        3.80 cm 2.12 cm/m   RA Area:     10.20 cm LA Vol (A2C):   71.7 ml 39.96 ml/m  RA Volume:   20.10 ml  11.20 ml/m LA Vol (A4C):   45.9 ml 25.58 ml/m LA Biplane Vol: 57.6 ml 32.10 ml/m  AORTIC VALVE AV Area (Vmax):    1.85 cm AV Area (Vmean):   2.22 cm AV Area (VTI):     2.74 cm AV Vmax:           185.37 cm/s AV Vmean:          111.443 cm/s AV VTI:            0.265 m AV Peak Grad:      13.7 mmHg AV Mean Grad:      6.1 mmHg LVOT Vmax:         135.00 cm/s LVOT Vmean:        97.300 cm/s LVOT VTI:          0.285 m LVOT/AV VTI ratio: 1.08  AORTA Ao Root diam: 2.60 cm Ao Asc diam:   2.70 cm MITRAL VALVE MV Area (PHT): 3.89 cm     SHUNTS MV Decel Time: 195 msec     Systemic VTI:  0.29 m MV E velocity: 114.00 cm/s  Systemic Diam: 1.80 cm MV A velocity: 93.80 cm/s MV E/A ratio:  1.22 Dorn Ross MD Electronically signed by Dorn Ross MD Signature Date/Time: 11/21/2023/4:21:06 PM    Final    DG Chest Port 1 View Result Date: 11/21/2023 CLINICAL DATA:  10026 Shortness of breath 10026 EXAM: PORTABLE CHEST 1 VIEW COMPARISON:  June 28, 2023 FINDINGS: The cardiomediastinal silhouette is unchanged in contour. Trace LEFT pleural effusion. No pneumothorax. No acute pleuroparenchymal abnormality. IMPRESSION: Trace LEFT pleural effusion. Electronically Signed   By: Corean Salter M.D.   On: 11/21/2023 10:06   IR Tips Result Date: 11/19/2023 CLINICAL DATA:  73 year old with cirrhosis and refractory ascites. Patient requires frequent large volume paracentesis. MELD 3.0 = 15. EXAM: 1. Ultrasound-guided paracentesis 2. Ultrasound-guided access of the right internal jugular vein 3. Ultrasound-guided access of the right common femoral vein 4. Hepatic venogram 5. Intravascular ultrasound 6. Catheterization of the portal vein 7. Portal venous and central manometry 8. Portal venogram 9. Creation of a transhepatic portal vein to hepatic vein shunt MEDICATIONS: As antibiotic prophylaxis, Rocephin  2 g was ordered pre-procedure and administered intravenously within one hour of incision. ANESTHESIA/SEDATION: General - as administered by the Anesthesia department CONTRAST:  75 mL Omnipaque  300, intravenous FLUOROSCOPY TIME:  Radiation Exposure Index (as provided by the fluoroscopic device): 220 mGy Kerma COMPLICATIONS: None immediate. PROCEDURE: Informed written consent was obtained from the patient after a thorough discussion of the procedural risks, benefits and alternatives. All questions were addressed. Maximal Sterile Barrier Technique was utilized including caps, mask, sterile gowns, sterile  gloves, sterile drape, hand hygiene and skin antiseptic. A timeout was performed prior to the initiation of the procedure. Dr. Wilkie Lent assisted with the procedure. Right abdomen, right groin and right neck were prepped and draped in sterile fashion. Ultrasound demonstrated a large pocket of fluid in the right abdomen. Ultrasound image was saved for documentation. Skin was anesthetized with  1% lidocaine . Using ultrasound guidance, paracentesis catheter was directed into the ascites. Approximately 5 L of yellow fluid was drained during the procedure. Paracentesis catheter was removed after the paracentesis. Bandage placed over the puncture site. Ultrasound confirmed a patent right common femoral vein. Using a combination of fluoroscopy and ultrasound, an access site was determined. A small dermatotomy was made at the planned puncture site. Using ultrasound guidance, access into the right common femoral vein was obtained with visualization of needle entry into the vessel using a standard micropuncture technique. Wire was advanced into the IVC. An 8 French vascular sheath was placed. Through this access site, an 62 French Accunav ICE catheter was advanced with ease under fluoroscopic guidance to the level of the intrahepatic inferior vena cava. A preliminary ultrasound of the right neck was performed and demonstrates a patent internal jugular vein. A permanent ultrasound image was recorded. Using a combination of fluoroscopy and ultrasound, an access site was determined. A small dermatotomy was made at the planned puncture site. Using ultrasound guidance, access into the right internal jugular vein was obtained with visualization of needle entry into the vessel using a standard micropuncture technique. A wire was advanced into the IVC and serial fascial dilation performed. A 10 French tips sheath was placed into the internal jugular vein and advanced to the IVC. The jugular sheath was retracted into the right  atrium and manometry was performed. A 5 French angled tip catheter was then directed into the middle hepatic vein. Hepatic venogram was performed. These images demonstrated a patent hepatic vein with no stenosis. The catheter was advanced to a wedge portion of the patent vein over which the 10 French sheath was advanced into the middle hepatic vein. Using ICE ultrasound visualization the catheter and middle hepatic vein as well as the portal anatomy was defined. A planned exit site from the hepatic vein and puncture site from the portal vein was placed into a single sonographic plane. Under direct ultrasound visualization, the ScorpionX needle was advanced into the portal venous system near the portal vein bifurcation. A Glidewire Advantage was then advanced into the splenic vein. A 5 French marking pigtail catheter was then advanced over the wire into the main portal vein. It was difficult to remove the wire from the pigtail catheter. It was noted that the [employer] Advantage coating had sheared off and was trapped within the pigtail catheter. We were unable to advance a microwire or microcatheter through the pigtail catheter. We attempted to snare the wire coating with micro snare devices but this was also unsuccessful. Back of a stiff Glidewire was advanced into the pigtail catheter adjacent to the sheared off wire coating. The pigtail catheter was advanced further into the portal venous system until the wire was near the portal venous system. The stiff wire never exited the pigtail catheter. The pigtail catheter hub was cut. A 6 French Neuron MAX sheath was advanced over the pigtail catheter and through the 10 French sheath. The 6 French sheath was successfully advanced over the pigtail catheter and into the portal venous system using fluoroscopy. Once the 6 French sheath was within the portal venous system, the pigtail catheter was completely removed. Portal venogram was performed through the 6 French vascular  sheath. Superstiff Amplatz wire was placed. A new pigtail catheter was placed. Portal manometry was then performed. The tract was then dilated to 8 mm with an 8 mm x 8 cm [employer] balloon. A 8-10 mm by 8 + 2 cm of Viatorr endograft  was placed. This was ultimately dilated to 8 mm. After placement of the shunt, right atrial and portal pressures were repeated. Completion portal venogram demonstrates a patent TIPS endograft without significant gastroesophageal varices. The catheters and sheath were removed and manual compression was applied to the right internal jugular and right common femoral venous access sites until hemostasis was achieved. The patient was transferred to the PACU in stable condition. Pre-TIPS Mean Pressures (mmHg): Right atrium: 10 Portal vein: 39 Portosystemic gradient: 29 Post-TIPS Mean Pressures (mmHg): Right atrium:28 Portal vein: 34 Portosystemic gradient: 6 FINDINGS: Main portal vein and TIPS stent were widely patent at the end of the procedure. No significant filling of varices on the final portogram images. 5 L of ascites was removed. IMPRESSION: 1. Successful transjugular portosystemic shunt creation. 2. Portosystemic gradient of 29 mm Hg (absolute portal venous pressure 39 mm Hg) before shunt placement and 6 mm Hg (absolute portal venous pressure 34 mm Hg) after shunt placement. PLAN: Patient will be admitted for overnight observation. Electronically Signed   By: Juliene Balder M.D.   On: 11/19/2023 16:42   IR US  Guide Vasc Access Right Result Date: 11/19/2023 CLINICAL DATA:  73 year old with cirrhosis and refractory ascites. Patient requires frequent large volume paracentesis. MELD 3.0 = 15. EXAM: 1. Ultrasound-guided paracentesis 2. Ultrasound-guided access of the right internal jugular vein 3. Ultrasound-guided access of the right common femoral vein 4. Hepatic venogram 5. Intravascular ultrasound 6. Catheterization of the portal vein 7. Portal venous and central manometry 8. Portal  venogram 9. Creation of a transhepatic portal vein to hepatic vein shunt MEDICATIONS: As antibiotic prophylaxis, Rocephin  2 g was ordered pre-procedure and administered intravenously within one hour of incision. ANESTHESIA/SEDATION: General - as administered by the Anesthesia department CONTRAST:  75 mL Omnipaque  300, intravenous FLUOROSCOPY TIME:  Radiation Exposure Index (as provided by the fluoroscopic device): 220 mGy Kerma COMPLICATIONS: None immediate. PROCEDURE: Informed written consent was obtained from the patient after a thorough discussion of the procedural risks, benefits and alternatives. All questions were addressed. Maximal Sterile Barrier Technique was utilized including caps, mask, sterile gowns, sterile gloves, sterile drape, hand hygiene and skin antiseptic. A timeout was performed prior to the initiation of the procedure. Dr. Wilkie Lent assisted with the procedure. Right abdomen, right groin and right neck were prepped and draped in sterile fashion. Ultrasound demonstrated a large pocket of fluid in the right abdomen. Ultrasound image was saved for documentation. Skin was anesthetized with 1% lidocaine . Using ultrasound guidance, paracentesis catheter was directed into the ascites. Approximately 5 L of yellow fluid was drained during the procedure. Paracentesis catheter was removed after the paracentesis. Bandage placed over the puncture site. Ultrasound confirmed a patent right common femoral vein. Using a combination of fluoroscopy and ultrasound, an access site was determined. A small dermatotomy was made at the planned puncture site. Using ultrasound guidance, access into the right common femoral vein was obtained with visualization of needle entry into the vessel using a standard micropuncture technique. Wire was advanced into the IVC. An 8 French vascular sheath was placed. Through this access site, an 2 French Accunav ICE catheter was advanced with ease under fluoroscopic guidance to  the level of the intrahepatic inferior vena cava. A preliminary ultrasound of the right neck was performed and demonstrates a patent internal jugular vein. A permanent ultrasound image was recorded. Using a combination of fluoroscopy and ultrasound, an access site was determined. A small dermatotomy was made at the planned puncture site. Using ultrasound guidance, access  into the right internal jugular vein was obtained with visualization of needle entry into the vessel using a standard micropuncture technique. A wire was advanced into the IVC and serial fascial dilation performed. A 10 French tips sheath was placed into the internal jugular vein and advanced to the IVC. The jugular sheath was retracted into the right atrium and manometry was performed. A 5 French angled tip catheter was then directed into the middle hepatic vein. Hepatic venogram was performed. These images demonstrated a patent hepatic vein with no stenosis. The catheter was advanced to a wedge portion of the patent vein over which the 10 French sheath was advanced into the middle hepatic vein. Using ICE ultrasound visualization the catheter and middle hepatic vein as well as the portal anatomy was defined. A planned exit site from the hepatic vein and puncture site from the portal vein was placed into a single sonographic plane. Under direct ultrasound visualization, the ScorpionX needle was advanced into the portal venous system near the portal vein bifurcation. A Glidewire Advantage was then advanced into the splenic vein. A 5 French marking pigtail catheter was then advanced over the wire into the main portal vein. It was difficult to remove the wire from the pigtail catheter. It was noted that the [facility] Advantage coating had sheared off and was trapped within the pigtail catheter. We were unable to advance a microwire or microcatheter through the pigtail catheter. We attempted to snare the wire coating with micro snare devices but this  was also unsuccessful. Back of a stiff Glidewire was advanced into the pigtail catheter adjacent to the sheared off wire coating. The pigtail catheter was advanced further into the portal venous system until the wire was near the portal venous system. The stiff wire never exited the pigtail catheter. The pigtail catheter hub was cut. A 6 French Neuron MAX sheath was advanced over the pigtail catheter and through the 10 French sheath. The 6 French sheath was successfully advanced over the pigtail catheter and into the portal venous system using fluoroscopy. Once the 6 French sheath was within the portal venous system, the pigtail catheter was completely removed. Portal venogram was performed through the 6 French vascular sheath. Superstiff Amplatz wire was placed. A new pigtail catheter was placed. Portal manometry was then performed. The tract was then dilated to 8 mm with an 8 mm x 8 cm Athletis balloon. A 8-10 mm by 8 + 2 cm of Viatorr endograft was placed. This was ultimately dilated to 8 mm. After placement of the shunt, right atrial and portal pressures were repeated. Completion portal venogram demonstrates a patent TIPS endograft without significant gastroesophageal varices. The catheters and sheath were removed and manual compression was applied to the right internal jugular and right common femoral venous access sites until hemostasis was achieved. The patient was transferred to the PACU in stable condition. Pre-TIPS Mean Pressures (mmHg): Right atrium: 10 Portal vein: 39 Portosystemic gradient: 29 Post-TIPS Mean Pressures (mmHg): Right atrium:28 Portal vein: 34 Portosystemic gradient: 6 FINDINGS: Main portal vein and TIPS stent were widely patent at the end of the procedure. No significant filling of varices on the final portogram images. 5 L of ascites was removed. IMPRESSION: 1. Successful transjugular portosystemic shunt creation. 2. Portosystemic gradient of 29 mm Hg (absolute portal venous pressure 39  mm Hg) before shunt placement and 6 mm Hg (absolute portal venous pressure 34 mm Hg) after shunt placement. PLAN: Patient will be admitted for overnight observation. Electronically Signed   By:  Juliene Balder M.D.   On: 11/19/2023 16:42   IR US  Guide Vasc Access Right Result Date: 11/19/2023 CLINICAL DATA:  73 year old with cirrhosis and refractory ascites. Patient requires frequent large volume paracentesis. MELD 3.0 = 15. EXAM: 1. Ultrasound-guided paracentesis 2. Ultrasound-guided access of the right internal jugular vein 3. Ultrasound-guided access of the right common femoral vein 4. Hepatic venogram 5. Intravascular ultrasound 6. Catheterization of the portal vein 7. Portal venous and central manometry 8. Portal venogram 9. Creation of a transhepatic portal vein to hepatic vein shunt MEDICATIONS: As antibiotic prophylaxis, Rocephin  2 g was ordered pre-procedure and administered intravenously within one hour of incision. ANESTHESIA/SEDATION: General - as administered by the Anesthesia department CONTRAST:  75 mL Omnipaque  300, intravenous FLUOROSCOPY TIME:  Radiation Exposure Index (as provided by the fluoroscopic device): 220 mGy Kerma COMPLICATIONS: None immediate. PROCEDURE: Informed written consent was obtained from the patient after a thorough discussion of the procedural risks, benefits and alternatives. All questions were addressed. Maximal Sterile Barrier Technique was utilized including caps, mask, sterile gowns, sterile gloves, sterile drape, hand hygiene and skin antiseptic. A timeout was performed prior to the initiation of the procedure. Dr. Wilkie Lent assisted with the procedure. Right abdomen, right groin and right neck were prepped and draped in sterile fashion. Ultrasound demonstrated a large pocket of fluid in the right abdomen. Ultrasound image was saved for documentation. Skin was anesthetized with 1% lidocaine . Using ultrasound guidance, paracentesis catheter was directed into the  ascites. Approximately 5 L of yellow fluid was drained during the procedure. Paracentesis catheter was removed after the paracentesis. Bandage placed over the puncture site. Ultrasound confirmed a patent right common femoral vein. Using a combination of fluoroscopy and ultrasound, an access site was determined. A small dermatotomy was made at the planned puncture site. Using ultrasound guidance, access into the right common femoral vein was obtained with visualization of needle entry into the vessel using a standard micropuncture technique. Wire was advanced into the IVC. An 8 French vascular sheath was placed. Through this access site, an 78 French Accunav ICE catheter was advanced with ease under fluoroscopic guidance to the level of the intrahepatic inferior vena cava. A preliminary ultrasound of the right neck was performed and demonstrates a patent internal jugular vein. A permanent ultrasound image was recorded. Using a combination of fluoroscopy and ultrasound, an access site was determined. A small dermatotomy was made at the planned puncture site. Using ultrasound guidance, access into the right internal jugular vein was obtained with visualization of needle entry into the vessel using a standard micropuncture technique. A wire was advanced into the IVC and serial fascial dilation performed. A 10 French tips sheath was placed into the internal jugular vein and advanced to the IVC. The jugular sheath was retracted into the right atrium and manometry was performed. A 5 French angled tip catheter was then directed into the middle hepatic vein. Hepatic venogram was performed. These images demonstrated a patent hepatic vein with no stenosis. The catheter was advanced to a wedge portion of the patent vein over which the 10 French sheath was advanced into the middle hepatic vein. Using ICE ultrasound visualization the catheter and middle hepatic vein as well as the portal anatomy was defined. A planned exit site  from the hepatic vein and puncture site from the portal vein was placed into a single sonographic plane. Under direct ultrasound visualization, the ScorpionX needle was advanced into the portal venous system near the portal vein bifurcation. A Glidewire Advantage  was then advanced into the splenic vein. A 5 French marking pigtail catheter was then advanced over the wire into the main portal vein. It was difficult to remove the wire from the pigtail catheter. It was noted that the [facility] Advantage coating had sheared off and was trapped within the pigtail catheter. We were unable to advance a microwire or microcatheter through the pigtail catheter. We attempted to snare the wire coating with micro snare devices but this was also unsuccessful. Back of a stiff Glidewire was advanced into the pigtail catheter adjacent to the sheared off wire coating. The pigtail catheter was advanced further into the portal venous system until the wire was near the portal venous system. The stiff wire never exited the pigtail catheter. The pigtail catheter hub was cut. A 6 French Neuron MAX sheath was advanced over the pigtail catheter and through the 10 French sheath. The 6 French sheath was successfully advanced over the pigtail catheter and into the portal venous system using fluoroscopy. Once the 6 French sheath was within the portal venous system, the pigtail catheter was completely removed. Portal venogram was performed through the 6 French vascular sheath. Superstiff Amplatz wire was placed. A new pigtail catheter was placed. Portal manometry was then performed. The tract was then dilated to 8 mm with an 8 mm x 8 cm Athletis balloon. A 8-10 mm by 8 + 2 cm of Viatorr endograft was placed. This was ultimately dilated to 8 mm. After placement of the shunt, right atrial and portal pressures were repeated. Completion portal venogram demonstrates a patent TIPS endograft without significant gastroesophageal varices. The catheters and  sheath were removed and manual compression was applied to the right internal jugular and right common femoral venous access sites until hemostasis was achieved. The patient was transferred to the PACU in stable condition. Pre-TIPS Mean Pressures (mmHg): Right atrium: 10 Portal vein: 39 Portosystemic gradient: 29 Post-TIPS Mean Pressures (mmHg): Right atrium:28 Portal vein: 34 Portosystemic gradient: 6 FINDINGS: Main portal vein and TIPS stent were widely patent at the end of the procedure. No significant filling of varices on the final portogram images. 5 L of ascites was removed. IMPRESSION: 1. Successful transjugular portosystemic shunt creation. 2. Portosystemic gradient of 29 mm Hg (absolute portal venous pressure 39 mm Hg) before shunt placement and 6 mm Hg (absolute portal venous pressure 34 mm Hg) after shunt placement. PLAN: Patient will be admitted for overnight observation. Electronically Signed   By: Juliene Balder M.D.   On: 11/19/2023 16:42   IR Paracentesis Result Date: 11/19/2023 CLINICAL DATA:  73 year old with cirrhosis and refractory ascites. Patient requires frequent large volume paracentesis. MELD 3.0 = 15. EXAM: 1. Ultrasound-guided paracentesis 2. Ultrasound-guided access of the right internal jugular vein 3. Ultrasound-guided access of the right common femoral vein 4. Hepatic venogram 5. Intravascular ultrasound 6. Catheterization of the portal vein 7. Portal venous and central manometry 8. Portal venogram 9. Creation of a transhepatic portal vein to hepatic vein shunt MEDICATIONS: As antibiotic prophylaxis, Rocephin  2 g was ordered pre-procedure and administered intravenously within one hour of incision. ANESTHESIA/SEDATION: General - as administered by the Anesthesia department CONTRAST:  75 mL Omnipaque  300, intravenous FLUOROSCOPY TIME:  Radiation Exposure Index (as provided by the fluoroscopic device): 220 mGy Kerma COMPLICATIONS: None immediate. PROCEDURE: Informed written consent was  obtained from the patient after a thorough discussion of the procedural risks, benefits and alternatives. All questions were addressed. Maximal Sterile Barrier Technique was utilized including caps, mask, sterile gowns, sterile gloves,  sterile drape, hand hygiene and skin antiseptic. A timeout was performed prior to the initiation of the procedure. Dr. Wilkie Lent assisted with the procedure. Right abdomen, right groin and right neck were prepped and draped in sterile fashion. Ultrasound demonstrated a large pocket of fluid in the right abdomen. Ultrasound image was saved for documentation. Skin was anesthetized with 1% lidocaine . Using ultrasound guidance, paracentesis catheter was directed into the ascites. Approximately 5 L of yellow fluid was drained during the procedure. Paracentesis catheter was removed after the paracentesis. Bandage placed over the puncture site. Ultrasound confirmed a patent right common femoral vein. Using a combination of fluoroscopy and ultrasound, an access site was determined. A small dermatotomy was made at the planned puncture site. Using ultrasound guidance, access into the right common femoral vein was obtained with visualization of needle entry into the vessel using a standard micropuncture technique. Wire was advanced into the IVC. An 8 French vascular sheath was placed. Through this access site, an 58 French Accunav ICE catheter was advanced with ease under fluoroscopic guidance to the level of the intrahepatic inferior vena cava. A preliminary ultrasound of the right neck was performed and demonstrates a patent internal jugular vein. A permanent ultrasound image was recorded. Using a combination of fluoroscopy and ultrasound, an access site was determined. A small dermatotomy was made at the planned puncture site. Using ultrasound guidance, access into the right internal jugular vein was obtained with visualization of needle entry into the vessel using a standard  micropuncture technique. A wire was advanced into the IVC and serial fascial dilation performed. A 10 French tips sheath was placed into the internal jugular vein and advanced to the IVC. The jugular sheath was retracted into the right atrium and manometry was performed. A 5 French angled tip catheter was then directed into the middle hepatic vein. Hepatic venogram was performed. These images demonstrated a patent hepatic vein with no stenosis. The catheter was advanced to a wedge portion of the patent vein over which the 10 French sheath was advanced into the middle hepatic vein. Using ICE ultrasound visualization the catheter and middle hepatic vein as well as the portal anatomy was defined. A planned exit site from the hepatic vein and puncture site from the portal vein was placed into a single sonographic plane. Under direct ultrasound visualization, the ScorpionX needle was advanced into the portal venous system near the portal vein bifurcation. A Glidewire Advantage was then advanced into the splenic vein. A 5 French marking pigtail catheter was then advanced over the wire into the main portal vein. It was difficult to remove the wire from the pigtail catheter. It was noted that the [facility] Advantage coating had sheared off and was trapped within the pigtail catheter. We were unable to advance a microwire or microcatheter through the pigtail catheter. We attempted to snare the wire coating with micro snare devices but this was also unsuccessful. Back of a stiff Glidewire was advanced into the pigtail catheter adjacent to the sheared off wire coating. The pigtail catheter was advanced further into the portal venous system until the wire was near the portal venous system. The stiff wire never exited the pigtail catheter. The pigtail catheter hub was cut. A 6 French Neuron MAX sheath was advanced over the pigtail catheter and through the 10 French sheath. The 6 French sheath was successfully advanced over the  pigtail catheter and into the portal venous system using fluoroscopy. Once the 6 French sheath was within the portal venous system,  the pigtail catheter was completely removed. Portal venogram was performed through the 6 French vascular sheath. Superstiff Amplatz wire was placed. A new pigtail catheter was placed. Portal manometry was then performed. The tract was then dilated to 8 mm with an 8 mm x 8 cm Athletis balloon. A 8-10 mm by 8 + 2 cm of Viatorr endograft was placed. This was ultimately dilated to 8 mm. After placement of the shunt, right atrial and portal pressures were repeated. Completion portal venogram demonstrates a patent TIPS endograft without significant gastroesophageal varices. The catheters and sheath were removed and manual compression was applied to the right internal jugular and right common femoral venous access sites until hemostasis was achieved. The patient was transferred to the PACU in stable condition. Pre-TIPS Mean Pressures (mmHg): Right atrium: 10 Portal vein: 39 Portosystemic gradient: 29 Post-TIPS Mean Pressures (mmHg): Right atrium:28 Portal vein: 34 Portosystemic gradient: 6 FINDINGS: Main portal vein and TIPS stent were widely patent at the end of the procedure. No significant filling of varices on the final portogram images. 5 L of ascites was removed. IMPRESSION: 1. Successful transjugular portosystemic shunt creation. 2. Portosystemic gradient of 29 mm Hg (absolute portal venous pressure 39 mm Hg) before shunt placement and 6 mm Hg (absolute portal venous pressure 34 mm Hg) after shunt placement. PLAN: Patient will be admitted for overnight observation. Electronically Signed   By: Juliene Balder M.D.   On: 11/19/2023 16:42   IR INTRAVASCULAR ULTRASOUND NON CORONARY Result Date: 11/19/2023 CLINICAL DATA:  73 year old with cirrhosis and refractory ascites. Patient requires frequent large volume paracentesis. MELD 3.0 = 15. EXAM: 1. Ultrasound-guided paracentesis 2.  Ultrasound-guided access of the right internal jugular vein 3. Ultrasound-guided access of the right common femoral vein 4. Hepatic venogram 5. Intravascular ultrasound 6. Catheterization of the portal vein 7. Portal venous and central manometry 8. Portal venogram 9. Creation of a transhepatic portal vein to hepatic vein shunt MEDICATIONS: As antibiotic prophylaxis, Rocephin  2 g was ordered pre-procedure and administered intravenously within one hour of incision. ANESTHESIA/SEDATION: General - as administered by the Anesthesia department CONTRAST:  75 mL Omnipaque  300, intravenous FLUOROSCOPY TIME:  Radiation Exposure Index (as provided by the fluoroscopic device): 220 mGy Kerma COMPLICATIONS: None immediate. PROCEDURE: Informed written consent was obtained from the patient after a thorough discussion of the procedural risks, benefits and alternatives. All questions were addressed. Maximal Sterile Barrier Technique was utilized including caps, mask, sterile gowns, sterile gloves, sterile drape, hand hygiene and skin antiseptic. A timeout was performed prior to the initiation of the procedure. Dr. Wilkie Lent assisted with the procedure. Right abdomen, right groin and right neck were prepped and draped in sterile fashion. Ultrasound demonstrated a large pocket of fluid in the right abdomen. Ultrasound image was saved for documentation. Skin was anesthetized with 1% lidocaine . Using ultrasound guidance, paracentesis catheter was directed into the ascites. Approximately 5 L of yellow fluid was drained during the procedure. Paracentesis catheter was removed after the paracentesis. Bandage placed over the puncture site. Ultrasound confirmed a patent right common femoral vein. Using a combination of fluoroscopy and ultrasound, an access site was determined. A small dermatotomy was made at the planned puncture site. Using ultrasound guidance, access into the right common femoral vein was obtained with visualization of  needle entry into the vessel using a standard micropuncture technique. Wire was advanced into the IVC. An 8 French vascular sheath was placed. Through this access site, an 68 French Accunav ICE catheter was advanced with ease under  fluoroscopic guidance to the level of the intrahepatic inferior vena cava. A preliminary ultrasound of the right neck was performed and demonstrates a patent internal jugular vein. A permanent ultrasound image was recorded. Using a combination of fluoroscopy and ultrasound, an access site was determined. A small dermatotomy was made at the planned puncture site. Using ultrasound guidance, access into the right internal jugular vein was obtained with visualization of needle entry into the vessel using a standard micropuncture technique. A wire was advanced into the IVC and serial fascial dilation performed. A 10 French tips sheath was placed into the internal jugular vein and advanced to the IVC. The jugular sheath was retracted into the right atrium and manometry was performed. A 5 French angled tip catheter was then directed into the middle hepatic vein. Hepatic venogram was performed. These images demonstrated a patent hepatic vein with no stenosis. The catheter was advanced to a wedge portion of the patent vein over which the 10 French sheath was advanced into the middle hepatic vein. Using ICE ultrasound visualization the catheter and middle hepatic vein as well as the portal anatomy was defined. A planned exit site from the hepatic vein and puncture site from the portal vein was placed into a single sonographic plane. Under direct ultrasound visualization, the ScorpionX needle was advanced into the portal venous system near the portal vein bifurcation. A Glidewire Advantage was then advanced into the splenic vein. A 5 French marking pigtail catheter was then advanced over the wire into the main portal vein. It was difficult to remove the wire from the pigtail catheter. It was noted  that the [facility] Advantage coating had sheared off and was trapped within the pigtail catheter. We were unable to advance a microwire or microcatheter through the pigtail catheter. We attempted to snare the wire coating with micro snare devices but this was also unsuccessful. Back of a stiff Glidewire was advanced into the pigtail catheter adjacent to the sheared off wire coating. The pigtail catheter was advanced further into the portal venous system until the wire was near the portal venous system. The stiff wire never exited the pigtail catheter. The pigtail catheter hub was cut. A 6 French Neuron MAX sheath was advanced over the pigtail catheter and through the 10 French sheath. The 6 French sheath was successfully advanced over the pigtail catheter and into the portal venous system using fluoroscopy. Once the 6 French sheath was within the portal venous system, the pigtail catheter was completely removed. Portal venogram was performed through the 6 French vascular sheath. Superstiff Amplatz wire was placed. A new pigtail catheter was placed. Portal manometry was then performed. The tract was then dilated to 8 mm with an 8 mm x 8 cm Athletis balloon. A 8-10 mm by 8 + 2 cm of Viatorr endograft was placed. This was ultimately dilated to 8 mm. After placement of the shunt, right atrial and portal pressures were repeated. Completion portal venogram demonstrates a patent TIPS endograft without significant gastroesophageal varices. The catheters and sheath were removed and manual compression was applied to the right internal jugular and right common femoral venous access sites until hemostasis was achieved. The patient was transferred to the PACU in stable condition. Pre-TIPS Mean Pressures (mmHg): Right atrium: 10 Portal vein: 39 Portosystemic gradient: 29 Post-TIPS Mean Pressures (mmHg): Right atrium:28 Portal vein: 34 Portosystemic gradient: 6 FINDINGS: Main portal vein and TIPS stent were widely patent at the  end of the procedure. No significant filling of varices on the final  portogram images. 5 L of ascites was removed. IMPRESSION: 1. Successful transjugular portosystemic shunt creation. 2. Portosystemic gradient of 29 mm Hg (absolute portal venous pressure 39 mm Hg) before shunt placement and 6 mm Hg (absolute portal venous pressure 34 mm Hg) after shunt placement. PLAN: Patient will be admitted for overnight observation. Electronically Signed   By: Juliene Balder M.D.   On: 11/19/2023 16:42   US  Paracentesis Result Date: 11/17/2023 INDICATION: Patient with a history of cirrhosis with recurrent ascites. Interventional Radiology asked to perform a diagnostic and therapeutic paracentesis. 5 L max EXAM: ULTRASOUND GUIDED PARACENTESIS MEDICATIONS: 1% lidocaine  10 mL COMPLICATIONS: None immediate. PROCEDURE: Informed written consent was obtained from the patient after a discussion of the risks, benefits and alternatives to treatment. A timeout was performed prior to the initiation of the procedure. Initial ultrasound scanning demonstrates a large amount of ascites within the left lower abdominal quadrant. The left lower abdomen was prepped and draped in the usual sterile fashion. 1% lidocaine  was used for local anesthesia. Following this, a 19 gauge, 7-cm, Yueh catheter was introduced. An ultrasound image was saved for documentation purposes. The paracentesis was performed. The catheter was removed and a dressing was applied. The patient tolerated the procedure well without immediate post procedural complication. Patient received post-procedure intravenous albumin ; see nursing notes for details. FINDINGS: A total of approximately 5 L of clear yellow fluid was removed. Samples were sent to the laboratory as requested by the clinical team. IMPRESSION: Successful ultrasound-guided paracentesis yielding 5 liters of peritoneal fluid. Procedure performed by: Warren Dais, NP PLAN: The patient is scheduled for TIPS procedure  November 19, 2023 Electronically Signed   By: Ester Sides M.D.   On: 11/17/2023 11:33    Microbiology: Results for orders placed or performed during the hospital encounter of 12/10/23  Culture, blood (Routine X 2) w Reflex to ID Panel     Status: None (Preliminary result)   Collection Time: 12/11/23  7:29 AM   Specimen: Left Antecubital; Blood  Result Value Ref Range Status   Specimen Description   Final    LEFT ANTECUBITAL BOTTLES DRAWN AEROBIC AND ANAEROBIC   Special Requests Blood Culture adequate volume  Final   Culture   Final    NO GROWTH 2 DAYS Performed at [facility], [address] [place], [place] [address]    Report Status PENDING  Incomplete  Culture, blood (Routine X 2) w Reflex to ID Panel     Status: None (Preliminary result)   Collection Time: 12/11/23  7:29 AM   Specimen: BLOOD RIGHT HAND  Result Value Ref Range Status   Specimen Description   Final    BLOOD RIGHT HAND BOTTLES DRAWN AEROBIC AND ANAEROBIC   Special Requests Blood Culture adequate volume  Final   Culture   Final    NO GROWTH 2 DAYS Performed at [facility], [address] [place], [place] [address]    Report Status PENDING  Incomplete    Labs: CBC: Recent Labs  Lab 12/10/23 1821  WBC 4.2  NEUTROABS 3.0  HGB 10.3*  HCT 31.8*  MCV 95.2  PLT 100*   Basic Metabolic Panel: Recent Labs  Lab 12/10/23 1821 12/11/23 0311 12/11/23 0729 12/12/23 0530 12/13/23 0443  NA 141 144  --  139 138  K 3.3* 3.8  --  4.8 4.5  CL 106 114*  --  111 111  CO2 20* 19*  --  17* 19*  GLUCOSE 169* 144*  --  134*  135*  BUN 13 11  --  9 9  CREATININE 1.29* 1.02*  --  0.91 0.92  CALCIUM  9.4 8.9  --  8.8* 8.3*  MG  --   --  2.0 2.0 2.0   Liver Function Tests: Recent Labs  Lab 12/10/23 1821 12/11/23 0311  AST 43* 33  ALT 19 13  ALKPHOS 198* 148*  BILITOT 5.5* 4.6*  PROT 7.7 6.0*  ALBUMIN  4.4 3.4*   CBG: Recent Labs  Lab 12/12/23 1136 12/12/23 1719 12/12/23 2025 12/13/23 0734  12/13/23 1059  GLUCAP 224* 192* 190* 113* 215*    Discharge time spent: greater than 30 minutes.  Signed: Alm Schneider, MD [facility] 12/13/2023

## 2023-12-12 NOTE — Progress Notes (Addendum)
 9066: RN made Dr Tat  aware BP 109/42 map 60, HR 75, propanolol IR 10 mg po ordered to give now. RN asked if MD would like this med given as ordered. MD responded yes.  9062: RN made Dr Tat aware potassium 3.8 yesterday and today 4.8, due for potassium 40 MEQ po now and it is ordered to give twice daily. MD dc'd potassium order.   1805: RN made Dr Tat aware patient is requesting nausea meds.109/32 map 54 HR 75 and BP rechecked 111/47 map 64 HR 75. Waiting for zofran  to be verified by pharmacy to give.

## 2023-12-13 LAB — BASIC METABOLIC PANEL WITH GFR
Anion gap: 8 (ref 5–15)
BUN: 9 mg/dL (ref 8–23)
CO2: 19 mmol/L — ABNORMAL LOW (ref 22–32)
Calcium: 8.3 mg/dL — ABNORMAL LOW (ref 8.9–10.3)
Chloride: 111 mmol/L (ref 98–111)
Creatinine, Ser: 0.92 mg/dL (ref 0.44–1.00)
GFR, Estimated: >60 mL/min (ref >60)
Glucose, Bld: 135 mg/dL — ABNORMAL HIGH (ref 70–99)
Potassium: 4.5 mmol/L (ref 3.5–5.1)
Sodium: 138 mmol/L (ref 135–145)

## 2023-12-13 LAB — MAGNESIUM: Magnesium: 2 mg/dL (ref 1.7–2.4)

## 2023-12-13 LAB — AMMONIA: Ammonia: 51 umol/L — ABNORMAL HIGH (ref 9–35)

## 2023-12-13 LAB — GLUCOSE, CAPILLARY
Glucose-Capillary: 113 mg/dL — ABNORMAL HIGH (ref 70–99)
Glucose-Capillary: 215 mg/dL — ABNORMAL HIGH (ref 70–99)

## 2023-12-13 LAB — LACTIC ACID, PLASMA: Lactic Acid, Venous: 2.1 mmol/L (ref 0.5–1.9)

## 2023-12-13 MED ORDER — PROPRANOLOL HCL 10 MG PO TABS: 10.0000 mg | ORAL_TABLET | Freq: Two times a day (BID) | ORAL | 1 refills | Status: DC

## 2023-12-13 NOTE — Evaluation (Signed)
 Physical Therapy Evaluation Patient Details Name: LANIAH GRIMM MRN: [id number] DOB: 1950/05/22 Today's Date: 12/13/2023  History of Present Illness  LEITA LINDBLOOM is a 73 y.o. female with medical history significant of hepatic cirrhosis presumed secondary to NASH, diabetes, GERD, bipolar.  Patient had a TIPS procedure done on 11/7.  She was observed overnight and discharged home.  Following discharge, she stopped taking her lactulose .  Over the last 3 days she has been increasingly confused with disequilibrium.  Her son discovered that she was not taking her lactulose  and started her yesterday and this morning, however she continued to be confused and was without improvement.  Her son brought her to the hospital for evaluation.  Denies fevers, chills, nausea, vomiting.  Her ammonia level was 72 and a lactic acid of 3.8.     While here, she did have a fall in the bathroom and hit her head. CT scan was done and was without acute intercranial process.   Clinical Impression  Patient functioning near baseline for functional mobility and gait other than requiring increased time with labored movement for sitting up at bedside. Patient demonstrates good return for ambulating in room, hallway without loss of balance and limited mostly due to fatigue. PLAN:  Patient to be discharged home today and discharged from acute physical therapy to care of nursing for ambulation as tolerated for length of stay with recommendations stated below          If plan is discharge home, recommend the following: A little help with walking and/or transfers;A little help with bathing/dressing/bathroom;Help with stairs or ramp for entrance;Assist for transportation;Assistance with cooking/housework   Can travel by private vehicle        Equipment Recommendations None recommended by PT  Recommendations for Other Services       Functional Status Assessment Patient has had a recent decline in their functional  status and demonstrates the ability to make significant improvements in function in a reasonable and predictable amount of time.     Precautions / Restrictions Precautions Precautions: Fall Recall of Precautions/Restrictions: Intact Restrictions Weight Bearing Restrictions Per Provider Order: No      Mobility  Bed Mobility Overal bed mobility: Needs Assistance Bed Mobility: Supine to Sit     Supine to sit: Contact guard     General bed mobility comments: increased time, labored movement with rest breaks    Transfers Overall transfer level: Needs assistance Equipment used: Rolling walker (2 wheels) Transfers: Sit to/from Stand, Bed to chair/wheelchair/BSC Sit to Stand: Supervision, Contact guard assist   Step pivot transfers: Supervision, Contact guard assist       General transfer comment: slightly labored movement with good return for transferring from/to chair    Ambulation/Gait Ambulation/Gait assistance: Supervision, Contact guard assist Gait Distance (Feet): 60 Feet Assistive device: Rolling walker (2 wheels) Gait Pattern/deviations: Decreased step length - right, Decreased step length - left, Decreased stride length, Steppage Gait velocity: decreased     General Gait Details: slightly labored movement without loss of balance, mild steppage gait on RLE and limited mostly due to fatigue  Stairs            Wheelchair Mobility     Tilt Bed    Modified Rankin (Stroke Patients Only)       Balance Overall balance assessment: Needs assistance Sitting-balance support: Feet supported, No upper extremity supported Sitting balance-Leahy Scale: Good     Standing balance support: During functional activity, Reliant on assistive device for balance, Bilateral  upper extremity supported Standing balance-Leahy Scale: Fair Standing balance comment: fair/good using RW                             Pertinent Vitals/Pain Pain Assessment Pain  Assessment: No/denies pain    Home Living Family/patient expects to be discharged to:: Private residence Living Arrangements: Children Available Help at Discharge: Family;Available 24 hours/day Type of Home: House Home Access: Level entry       Home Layout: One level Home Equipment: Shower seat;Wheelchair - manual;Toilet riser;Grab bars - Nurse, Mental Health (4 wheels)      Prior Function Prior Level of Function : Needs assist       Physical Assist : Mobility (physical);ADLs (physical) Mobility (physical): Bed mobility;Transfers;Gait;Stairs   Mobility Comments: household and short distanced community ambulation using Rollator ADLs Comments: indep; son picks up groceries for her, pt uses compensatory techniques for cooking ( microwave meals)     Extremity/Trunk Assessment   Upper Extremity Assessment Upper Extremity Assessment: Generalized weakness    Lower Extremity Assessment Lower Extremity Assessment: Generalized weakness    Cervical / Trunk Assessment Cervical / Trunk Assessment: Kyphotic  Communication   Communication Communication: No apparent difficulties    Cognition Arousal: Alert Behavior During Therapy: WFL for tasks assessed/performed   PT - Cognitive impairments: No apparent impairments                         Following commands: Intact       Cueing Cueing Techniques: Verbal cues     General Comments      Exercises     Assessment/Plan    PT Assessment All further PT needs can be met in the next venue of care  PT Problem List Decreased strength;Decreased activity tolerance;Decreased balance;Decreased mobility       PT Treatment Interventions      PT Goals (Current goals can be found in the Care Plan section)  Acute Rehab PT Goals Patient Stated Goal: return home with family to assist PT Goal Formulation: With patient Time For Goal Achievement: 12/13/23 Potential to Achieve Goals: Good    Frequency        Co-evaluation               AM-PAC PT 6 Clicks Mobility  Outcome Measure Help needed turning from your back to your side while in a flat bed without using bedrails?: None Help needed moving from lying on your back to sitting on the side of a flat bed without using bedrails?: A Little Help needed moving to and from a bed to a chair (including a wheelchair)?: A Little Help needed standing up from a chair using your arms (e.g., wheelchair or bedside chair)?: A Little Help needed to walk in hospital room?: A Little Help needed climbing 3-5 steps with a railing? : A Lot 6 Click Score: 18    End of Session   Activity Tolerance: Patient tolerated treatment well;Patient limited by fatigue Patient left: in chair;with call bell/phone within reach Nurse Communication: Mobility status PT Visit Diagnosis: Unsteadiness on feet (R26.81);Other abnormalities of gait and mobility (R26.89);Muscle weakness (generalized) (M62.81)    Time: 8788-8766 PT Time Calculation (min) (ACUTE ONLY): 22 min   Charges:   PT Evaluation $PT Eval Moderate Complexity: 1 Mod PT Treatments $Therapeutic Activity: 8-22 mins PT General Charges $$ ACUTE PT VISIT: 1 Visit         2:13 PM, 12/13/23  Lynwood Music, MPT Physical Therapist with [facility] [phone] office [phone] mobile phone

## 2023-12-13 NOTE — Care Management Important Message (Signed)
 Important Message  Patient Details  Name: Andrea Dickson MRN: [id number] Date of Birth: 1950-07-24   Important Message Given:  N/A - LOS <3 / Initial given by admissions     Monnie Anspach L Adja Ruff 12/13/2023, 2:04 PM

## 2023-12-13 NOTE — Plan of Care (Signed)

## 2023-12-13 NOTE — TOC Transition Note (Signed)
 Transition of Care [facility]) - Discharge Note   Patient Details  Name: Andrea Dickson MRN: [id number] Date of Birth: 25-Oct-1950  Transition of Care [facility]) CM/SW Contact:  Mcarthur Saddie Kim, LCSW Phone Number: 12/13/2023, 1:38 PM   Clinical Narrative:  Pt d/c today. She reports she is active with home health but cannot remember the agency. Per notes a couple weeks ago, referred to [facility]. Dorothe with [facility] notified of d/c. HHPT order in.      Final next level of care: Home w Home Health Services Barriers to Discharge: Barriers Resolved   Patient Goals and CMS Choice            Discharge Placement                    Patient and family notified of of transfer: 12/13/23  Discharge Plan and Services Additional resources added to the After Visit Summary for                            [facility] Arranged: PT [facility] Agency: [facility] Date [facility] Agency Contacted: 12/13/23 Time HH Agency Contacted: 1338 Representative spoke with at [facility] Agency: Dorothe  Social Drivers of Health (SDOH) Interventions SDOH Screenings   Food Insecurity: No Food Insecurity (11/19/2023)  Housing: Low Risk  (11/19/2023)  Transportation Needs: No Transportation Needs (11/19/2023)  Utilities: Not At Risk (11/19/2023)  Depression (PHQ2-9): Low Risk  (09/30/2021)  Financial Resource Strain: Low Risk (11/10/2021)   [facility] from [facility]  Physical Activity: Inactive (11/10/2021)   Received from [facility]  Social Connections: Socially Isolated (11/19/2023)  Stress: No Stress Concern Present (11/10/2021)   Received from [facility]  Tobacco Use: Medium Risk (12/10/2023)  Health Literacy: Medium Risk (11/10/2021)   Received from [facility]     Readmission Risk Interventions     No data to display

## 2023-12-14 ENCOUNTER — Telehealth: Payer: Self-pay | Admitting: *Deleted

## 2023-12-14 ENCOUNTER — Encounter: Payer: Self-pay | Admitting: *Deleted

## 2023-12-14 ENCOUNTER — Telehealth: Payer: Self-pay | Admitting: Student

## 2023-12-14 NOTE — Transitions of Care (Post Inpatient/ED Visit) (Signed)
 12/14/2023  Name: Andrea Dickson MRN: [id number] DOB: 1950-09-05  Today's TOC FU Call Status: Today's TOC FU Call Status:: Successful TOC FU Call Completed  Patient's Name and Date of Birth confirmed. Name, DOB  Transition Care Management Follow-up Telephone Call Date of Discharge: 12/13/23 [facility] Type of Discharge: Inpatient Admission Primary Inpatient Discharge Diagnosis:: Acute hepatic encephalopathy How have you been since you were released from the hospital?: Better Any questions or concerns?: No  Items Reviewed: Did you receive and understand the discharge instructions provided?: Yes Medications obtained,verified, and reconciled?: Yes (Medications Reviewed) Any new allergies since your discharge?: No Dietary orders reviewed?: No Do you have support at home?: Yes People in Home [RPT]: alone Name of Support/Comfort Primary Source: Nathaneil son and Renee sister  Medications Reviewed Today: Medications Reviewed Today     Reviewed by Kennieth Cathlean DEL, RN (Case Manager) on 12/14/23 at [phone]  Med List Status: <None>   Medication Order Taking? Sig Documenting Provider Last Dose Status Informant  acetaminophen  (TYLENOL ) 500 MG tablet [id number] Yes Take 1,000 mg by mouth 2 (two) times daily as needed for moderate pain or headache. [provider]  Active Pharmacy Records, Child  buPROPion  (WELLBUTRIN  XL) 150 MG 24 hr tablet [id number]  Take 1 tablet (150 mg total) by mouth every morning. Okey Barnie SAUNDERS, MD  Active Pharmacy Records, Child  Cholecalciferol (VITAMIN D3) 50 MCG [phone] UT) TABS [id number] Yes Take 2,000 Units by mouth daily.  [provider]  Active Pharmacy Records, Child  diazepam  (VALIUM ) 2 MG tablet [id number] Yes Take 1 tablet (2 mg total) by mouth daily as needed for anxiety.  Patient taking differently: Take 2 mg by mouth at bedtime as needed for anxiety (sleep).   Okey Barnie SAUNDERS, MD  Active Pharmacy Records, Child   estrogens , conjugated, (PREMARIN ) 0.625 MG tablet [id number] Yes Take 0.625 mg by mouth daily. [provider]  Active Child, Pharmacy Records  ezetimibe  (ZETIA ) 10 MG tablet [id number] Yes Take 10 mg by mouth at bedtime. [provider]  Active Pharmacy Records, Child  fluticasone [facility]) 50 MCG/ACT nasal spray [id number] Yes Place 2 sprays into both nostrils daily as needed for rhinitis or allergies. [provider]  Active Pharmacy Records, Child  furosemide  (LASIX ) 40 MG tablet [id number] Yes Take 1 tablet (40 mg total) by mouth daily. Kennedy Charmaine CROME, NP  Active Pharmacy Records, Child  hyoscyamine  (LEVSIN  SL) 0.125 MG SL tablet [id number] Yes Place 1 tablet (0.125 mg total) under the tongue every 6 (six) hours as needed for cramping. Shirlean Therisa ORN, NP  Active Pharmacy Records, Child  Lactobacillus-[facility]) CAPS [id number] Yes Take 1 capsule by mouth daily. [provider]  Active Pharmacy Records, Child  lactulose  (CHRONULAC ) 10 GM/15ML solution [id number] Yes Take 45 mLs (30 g total) by mouth in the morning and at bedtime. Tonette Warren SAUNDERS, NP  Active Child, Pharmacy Records  mirtazapine  (REMERON ) 15 MG tablet [id number] Yes Take 1 tablet (15 mg total) by mouth at bedtime. Okey Barnie SAUNDERS, MD  Active Pharmacy Records, Child  nitroGLYCERIN  (NITROSTAT ) 0.4 MG SL tablet [id number] Yes Place 1 tablet (0.4 mg total) under the tongue every 5 (five) minutes as needed for chest pain. Johnson Laymon HERO, PA-C  Active Child, Pharmacy Records  omeprazole  (PRILOSEC) 40 MG capsule [id number] Yes TAKE ONE (1) CAPSULE BY MOUTH TWICE DAILY AT BREAKFAST AND AT BEDTIME Kennedy Charmaine CROME, NP  Active Pharmacy Records, Child  ondansetron  (ZOFRAN ) 4  MG tablet [id number] Yes TAKE 1 TABLET BY MOUTH EVERY 8 HOURS AS NEEDED FOR NAUSEA & VOMITING Kennedy Charmaine CROME, NP  Active Pharmacy Records, Child  Pancrelipase , Lip-Prot-Amyl, (ZENPEP ) [phone] units CPEP  [id number] Yes Take 1 capsule by mouth 3 (three) times daily with meals. Kennedy Charmaine CROME, NP  Active Pharmacy Records, Child  propranolol  (INDERAL ) 10 MG tablet [id number] Yes Take 1 tablet (10 mg total) by mouth 2 (two) times daily. Evonnie Lenis, MD  Active   spironolactone  (ALDACTONE ) 100 MG tablet [id number] Yes Take 1 tablet (100 mg total) by mouth daily. Kennedy Charmaine CROME, NP  Active Pharmacy Records, Child  trimethoprim  (TRIMPEX ) 100 MG tablet [id number] Yes TAKE 1 TABLET BY MOUTH EVERY DAY AT BEDTIME Gerldine Lauraine BROCKS, FNP  Active Pharmacy Records, Child            Home Care and Equipment/Supplies: Were Home Health Services Ordered?: Yes Name of Home Health Agency:: [facility] [phone] to resume care) Has Agency set up a time to come to your home?: No EMR reviewed for Home Health Orders: Orders present/patient has not received call (refer to CM for follow-up) Any new equipment or medical supplies ordered?: No  Functional Questionnaire: Do you need assistance with bathing/showering or dressing?: No Do you need assistance with meal preparation?: Yes Do you need assistance with eating?: No Do you have difficulty maintaining continence: No Do you need assistance with getting out of bed/getting out of a chair/moving?: Yes (uses a rollator walker) Do you have difficulty managing or taking your medications?: No  Follow up appointments reviewed: PCP Follow-up appointment confirmed?: No MD Provider Line Number:[phone] Given: No (Patent will call for follow up appointment) Specialist Hospital Follow-up appointment confirmed?: Yes Date of Specialist follow-up appointment?: 01/20/24 Follow-Up Specialty Provider:: Charmaine [facility] Do you need transportation to your follow-up appointment?: No Do you understand care options if your condition(s) worsen?: Yes-patient verbalized understanding  SDOH Interventions Today    Flowsheet Row Most Recent Value  SDOH Interventions   Food  Insecurity Interventions Intervention Not Indicated  Housing Interventions Intervention Not Indicated  Transportation Interventions Patient Resources (Friends/Family), Intervention Not Indicated  Utilities Interventions Intervention Not Indicated   Discussed and offered 30 day TOC program.  Patient   declined.  The patient has been provided with contact information for the care management team and has been advised to call with any health -related questions or concerns.  The patient verbalized understanding with current plan of care.  The patient is directed to their insurance card regarding availability of benefits coverage   Cathlean Headland BSN RN [facility] Health Care Management Coordinator [email or web address] Direct Dial: [phone]  Fax: [phone] Website: [email or web address]

## 2023-12-14 NOTE — Progress Notes (Signed)
 Complex Care Management Note Care Guide Note  12/14/2023 Name: Andrea Dickson MRN: [id number] DOB: 05/16/1950   Complex Care Management Outreach Attempts: A third unsuccessful outreach was attempted today to offer the patient with information about available complex care management services.  Follow Up Plan:  No further outreach attempts will be made at this time. We have been unable to contact the patient to offer or enroll patient in complex care management services.  Encounter Outcome:  No Answer-Left voicemail  Leotis Rase [facility], [facility] Guide  Direct Dial: [phone]  Fax [phone]

## 2023-12-14 NOTE — Telephone Encounter (Signed)
 Pt c/o medication issue:  1. Name of Medication:   nitroGLYCERIN  (NITROSTAT ) 0.4 MG SL tablet    2. How are you currently taking this medication (dosage and times per day)?   3. Are you having a reaction (difficulty breathing--STAT)? No  4. What is your medication issue? Pharmacy states that prescription needs a max amount of tablets that pt is able to take before having to call 911 or go to the ED. Please advise

## 2023-12-14 NOTE — Telephone Encounter (Signed)
 I spoke with pharmacist, Therisa and told her may take one NTG five minutes apart, up to 3 tablets in 15 minutes. She confirmed they had pre-populated NTG instructions.

## 2023-12-14 NOTE — Telephone Encounter (Signed)
 This encounter was created in error - please disregard.

## 2023-12-16 LAB — CULTURE, BLOOD (ROUTINE X 2)
Culture: NO GROWTH
Culture: NO GROWTH
Special Requests: ADEQUATE
Special Requests: ADEQUATE

## 2023-12-28 ENCOUNTER — Inpatient Hospital Stay: Admission: RE | Admit: 2023-12-28 | Discharge: 2023-12-28 | Attending: Urology

## 2023-12-28 DIAGNOSIS — Z95828 Presence of other vascular implants and grafts: Secondary | ICD-10-CM

## 2023-12-28 DIAGNOSIS — K7469 Other cirrhosis of liver: Secondary | ICD-10-CM

## 2023-12-28 HISTORY — PX: IR RADIOLOGIST EVAL & MGMT: IMG5224

## 2023-12-28 NOTE — Progress Notes (Signed)
 Chief Complaint: The patient is seen in follow up today s/p TIPS on 11/19/23  Referring Physician(s): Kennedy Pfeiffer  Supervising Physician: Philip Cornet  History of present illness:  Andrea Dickson is a 73 year old female with a history of decompensated cirrhosis (presumably MASH) and recurrent ascites, known to IR from recurrent paracentesis. Patient underwent upper endoscopy on 04/19/23 which demonstrated grade 2 esophageal varices without bleeding and portal gastropathy. She was referred to IR to discuss possible TIPS procedure and was deemed a good candidate. She underwent and uncomplicated TIPS procedure with paracentesis on 11/7 and was set for discharge the following day. Unfortunately, the following day, she began to experience worsening dyspnea on exertion and chest pain requiring additional hospitalization time. Further workup revealed CXR with pulmonary edema, elevated troponins and BNP, and unremarkable EKG, overall suggestive of new onset CHF. Cardiology was consulted and patient was managed for acute HFpEF following post-procedure fluid overload. She was diuresed with IV lasix  and spironolactone  and sicharged home on 11/11.   She has followed up with Warren Dais, NP via telephone twice since her procedure at which times she reported overall improvement in her abdominal distention and shortness of breath.   She was admitted to hospital on 12/10/23 for altered mental status and discharged on 12/12/23.  Patient had stop talking lactulose  after the TIPS.  Mental status improved with lactulose .   Today she reports occasional left abdominal cramping.  Her weight has been stable and she does not feel distended. Patient has not required a paracentesis since the TIPS. She notes occasional central chest pain that has been happening since before the TIPS, last episode was last week.  Right leg swelling is decreasing and she is supposed to get compression stockings.  She reports  increased shaking since her propanolol dose has been decreased.  Her appetite is good and stable.  She is having trouble sleeping due to diarrhea but she only reports 2 bowel movements per day.    Past Medical History:  Diagnosis Date   Anxiety    Asthma    Bipolar 1 disorder (HCC)    Chronic diarrhea    Cirrhosis (HCC)    Diagnosed September 2020, likely secondary to [facility], s/p Hep A/B vaccination in 2021.    Depression    Diabetes (HCC)    Essential tremor    GERD (gastroesophageal reflux disease)    Headache    High cholesterol     Past Surgical History:  Procedure Laterality Date   ABDOMINAL HYSTERECTOMY  1997   BIOPSY  01/17/2018   Procedure: BIOPSY;  Surgeon: Shaaron Lamar HERO, MD;  [facility];  Service: Endoscopy;;  colon   carpal tunnel Bilateral 1999, 06/2009   COLONOSCOPY  2013   Dr. Donnel: no colon polyps. quality of prep was fair. repeat colonoscopy in five years.    COLONOSCOPY WITH PROPOFOL  N/A 01/17/2018   Dr. Shaaron: Colonic lipoma, 2 tubular adenomas removed.  Random colon biopsies negative.  Next colonoscopy 5 years.   COLONOSCOPY WITH PROPOFOL  N/A 09/26/2022   Procedure: COLONOSCOPY WITH PROPOFOL ;  Surgeon: Eartha Angelia Sieving, MD;  [facility]: [facility];  Service: Gastroenterology;  Laterality: N/A;   ESOPHAGOGASTRODUODENOSCOPY N/A 04/19/2023   Procedure: EGD (ESOPHAGOGASTRODUODENOSCOPY);  Surgeon: Cindie Carlin POUR, DO;  [facility]: [facility];  Service: Endoscopy;  Laterality: N/A;  1:45 pm, asa 3   ESOPHAGOGASTRODUODENOSCOPY (EGD) WITH PROPOFOL  N/A 04/06/2019   Procedure: ESOPHAGOGASTRODUODENOSCOPY (EGD) WITH PROPOFOL ;  Surgeon: Shaaron Lamar HERO, MD;  Normal esophagus, mild portal hypertensive gastropathy, otherwise normal exam.  Recommend repeat EGD in 2 years for variceal screening.    ESOPHAGOGASTRODUODENOSCOPY (EGD) WITH PROPOFOL  N/A 01/19/2022   Procedure: ESOPHAGOGASTRODUODENOSCOPY (EGD) WITH PROPOFOL ;  Surgeon: Shaaron Lamar HERO, MD;  [facility]:  [facility];  Service: Endoscopy;  Laterality: N/A;  12:45 pm, pt knows to arrive at 9:15   ESOPHAGOGASTRODUODENOSCOPY (EGD) WITH PROPOFOL  N/A 09/25/2022   Procedure: ESOPHAGOGASTRODUODENOSCOPY (EGD) WITH PROPOFOL ;  Surgeon: Eartha Angelia Sieving, MD;  [facility];  Service: Gastroenterology;  Laterality: N/A;   GIVENS CAPSULE STUDY N/A 06/16/2023   Procedure: IMAGING PROCEDURE, GI TRACT, INTRALUMINAL, VIA CAPSULE;  Surgeon: Cindie Carlin POUR, DO;  [facility]: [facility];  Service: Endoscopy;  Laterality: N/A;  730am   IR INTRAVASCULAR ULTRASOUND NON CORONARY  11/19/2023   IR PARACENTESIS  11/19/2023   IR RADIOLOGIST EVAL & MGMT  04/28/2023   IR TIPS  11/19/2023   IR US  GUIDE VASC ACCESS RIGHT  11/19/2023   IR US  GUIDE VASC ACCESS RIGHT  11/19/2023   POLYPECTOMY  01/17/2018   Procedure: POLYPECTOMY;  Surgeon: Shaaron Lamar HERO, MD;  [facility];  Service: Endoscopy;;  colon   POLYPECTOMY  09/26/2022   Procedure: POLYPECTOMY;  Surgeon: Eartha Angelia Sieving, MD;  [facility];  Service: Gastroenterology;;   SKIN CANCER EXCISION  2007   TIPS PROCEDURE N/A 11/19/2023   Procedure: INSERTION, SHUNT, INTRAHEPATIC PORTOSYSTEMIC, TRANSJUGULAR;  Surgeon: Philip Cornet, MD;  Location: [facility];  Service: Radiology;  Laterality: N/A;   TONSILLECTOMY  1965   ulner nerve  Left 06/2009   decompression    Allergies: Pramipexole, Crestor [rosuvastatin calcium ], Bee pollen, and Pollen extract  Medications: Prior to Admission medications  Medication Sig Start Date End Date Taking? Authorizing Provider  acetaminophen  (TYLENOL ) 500 MG tablet Take 1,000 mg by mouth 2 (two) times daily as needed for moderate pain or headache.    [provider]  buPROPion  (WELLBUTRIN  XL) 150 MG 24 hr tablet Take 1 tablet (150 mg total) by mouth every morning. 11/03/23 11/02/24  Okey Barnie SAUNDERS, MD  Cholecalciferol (VITAMIN D3) 50 MCG (2000 UT) TABS Take 2,000 Units by mouth daily.      [provider]  diazepam  (VALIUM ) 2 MG tablet Take 1 tablet (2 mg total) by mouth daily as needed for anxiety. Patient taking differently: Take 2 mg by mouth at bedtime as needed for anxiety (sleep). 11/03/23 11/02/24  Okey Barnie SAUNDERS, MD  estrogens , conjugated, (PREMARIN ) 0.625 MG tablet Take 0.625 mg by mouth daily. 11/16/23   [provider]  ezetimibe  (ZETIA ) 10 MG tablet Take 10 mg by mouth at bedtime. 10/19/19   [provider]  fluticasone (FLONASE) 50 MCG/ACT nasal spray Place 2 sprays into both nostrils daily as needed for rhinitis or allergies. 09/03/22   [provider]  furosemide  (LASIX ) 40 MG tablet Take 1 tablet (40 mg total) by mouth daily. 06/09/23 06/08/24  Kennedy Charmaine CROME, NP  hyoscyamine  (LEVSIN  SL) 0.125 MG SL tablet Place 1 tablet (0.125 mg total) under the tongue every 6 (six) hours as needed for cramping. 09/17/22   Shirlean Therisa ORN, NP  Lactobacillus-Inulin (CULTURELLE DIGESTIVE HEALTH) CAPS Take 1 capsule by mouth daily. 11/10/21   [provider]  lactulose  (CHRONULAC ) 10 GM/15ML solution Take 45 mLs (30 g total) by mouth in the morning and at bedtime. 12/03/23 06/30/24  Covington, Jamie R, NP  mirtazapine  (REMERON ) 15 MG tablet Take 1 tablet (15 mg  total) by mouth at bedtime. 11/03/23   Okey Barnie SAUNDERS, MD  nitroGLYCERIN  (NITROSTAT ) 0.4 MG SL tablet Place 1 tablet (0.4 mg total) under the tongue every 5 (five) minutes as needed for chest pain. 12/07/23   Strader, Laymon HERO, PA-C  omeprazole  (PRILOSEC) 40 MG capsule TAKE ONE (1) CAPSULE BY MOUTH TWICE DAILY AT [facility] AND AT BEDTIME 07/22/23   Kennedy Charmaine CROME, NP  ondansetron  (ZOFRAN ) 4 MG tablet TAKE 1 TABLET BY MOUTH EVERY 8 HOURS AS NEEDED FOR NAUSEA & VOMITING 12/30/22   Kennedy Charmaine CROME, NP  Pancrelipase , Lip-Prot-Amyl, (ZENPEP ) 39999-810399 units CPEP Take 1 capsule by mouth 3 (three) times daily with meals. 02/01/23   Kennedy Charmaine CROME, NP  propranolol  (INDERAL ) 10 MG tablet  Take 1 tablet (10 mg total) by mouth 2 (two) times daily. 12/13/23   Evonnie Lenis, MD  spironolactone  (ALDACTONE ) 100 MG tablet Take 1 tablet (100 mg total) by mouth daily. 06/09/23 06/08/24  Kennedy Charmaine CROME, NP  trimethoprim  (TRIMPEX ) 100 MG tablet TAKE 1 TABLET BY MOUTH EVERY DAY AT BEDTIME 06/29/23   Gerldine Lauraine BROCKS, FNP     Family History  Problem Relation Age of Onset   Diabetes Mother    Brain cancer Mother    Diabetes Father    Heart attack Father    Heart disease Father    Diabetes Sister    Bipolar disorder Brother    Diabetes Brother    Heart disease Brother    Colon cancer Neg Hx    Breast cancer Neg Hx     Social History   Socioeconomic History   Marital status: Divorced    Spouse name: Not on file   Number of children: 1   Years of education: 12   Highest education level: Not on file  Occupational History   Occupation: retired    Comment: employed at [employer]  Tobacco Use   Smoking status: Former    Current packs/day: 0.00    Average packs/day: 0.5 packs/day for 43.0 years (21.5 ttl pk-yrs)    Types: Cigarettes    Quit date: 2024    Years since quitting: 1.9   Smokeless tobacco: Never   Tobacco comments:    smoking since 72yrs old.    Vaping Use   Vaping status: Never Used  Substance and Sexual Activity   Alcohol use: No    Alcohol/week: 0.0 standard drinks of alcohol   Drug use: No   Sexual activity: Not Currently  Other Topics Concern   Not on file  Social History Narrative   Lives alone.  Retired.  Education 12th grade.  One child.     Caffeine use- sodas, 2 daily   Social Drivers of Health   Tobacco Use: Medium Risk (12/10/2023)   Patient History    Smoking Tobacco Use: Former    Smokeless Tobacco Use: Never    Passive Exposure: Not on file  Financial Resource Strain: Low Risk (11/10/2021)   Received from [facility]   Overall Financial Resource Strain (CARDIA)    Difficulty of Paying Living Expenses: Not hard at all  Food  Insecurity: No Food Insecurity (12/14/2023)   Epic    Worried About Radiation Protection Practitioner of Food in the Last Year: Never true    Ran Out of Food in the Last Year: Never true  Transportation Needs: No Transportation Needs (12/14/2023)   Epic    Lack of Transportation (Medical): No    Lack of Transportation (Non-Medical): No  Physical  Activity: Inactive (11/10/2021)   Received from [facility]   Exercise Vital Sign    On average, how many days per week do you engage in moderate to strenuous exercise (like a brisk walk)?: 0 days    On average, how many minutes do you engage in exercise at this level?: 0 min  Stress: No Stress Concern Present (11/10/2021)   Received from [facility] - Occupational Stress Questionnaire    Feeling of Stress : Not at all  Social Connections: Socially Isolated (11/19/2023)   Social Connection and Isolation Panel    Frequency of Communication with Friends and Family: More than three times a week    Frequency of Social Gatherings with Friends and Family: More than three times a week    Attends Religious Services: Never    Database Administrator or Organizations: No    Attends Banker Meetings: Never    Marital Status: Divorced  Depression (PHQ2-9): Low Risk (12/14/2023)   Depression (PHQ2-9)    PHQ-2 Score: 0  Alcohol Screen: Not on file  Housing: Low Risk (12/14/2023)   Epic    Unable to Pay for Housing in the Last Year: No    Number of Times Moved in the Last Year: 0    Homeless in the Last Year: No  Utilities: Not At Risk (12/14/2023)   Epic    Threatened with loss of utilities: No  Health Literacy: Medium Risk (11/10/2021)   Received from [facility] Literacy    How often do you need to have someone help you when you read instructions, pamphlets, or other written material from your doctor or pharmacy?: Sometimes     Imaging: No results found.  Physical Exam: Patient was in no  apparent distress on video call.  No shortness of breath with routine conversation.   Labs:  CBC: Recent Labs    11/20/23 0550 11/21/23 0418 11/22/23 0528 12/10/23 1821  WBC 3.7* 5.2 3.7* 4.2  HGB 8.5* 9.4* 9.0* 10.3*  HCT 26.2* 28.5* 26.8* 31.8*  PLT 60* 59* 64* 100*    COAGS: Recent Labs    11/19/23 0630 11/20/23 0550 11/21/23 0418 11/22/23 0528  INR 1.2 1.4* 1.5* 1.4*    BMP: Recent Labs    12/10/23 1821 12/11/23 0311 12/12/23 0530 12/13/23 0443  NA 141 144 139 138  K 3.3* 3.8 4.8 4.5  CL 106 114* 111 111  CO2 20* 19* 17* 19*  GLUCOSE 169* 144* 134* 135*  BUN 13 11 9 9   CALCIUM  9.4 8.9 8.8* 8.3*  CREATININE 1.29* 1.02* 0.91 0.92  GFRNONAA 44* 58* >60 >60    LIVER FUNCTION TESTS: Recent Labs    11/21/23 0418 11/22/23 0528 12/10/23 1821 12/11/23 0311  BILITOT 1.5* 1.9* 5.5* 4.6*  AST 58* 53* 43* 33  ALT 27 36 19 13  ALKPHOS 87 82 198* 148*  PROT 5.6* 5.2* 7.7 6.0*  ALBUMIN  3.5 3.0* 4.4 3.4*    Assessment/Plan:  74 year old female with a history of decompensated cirrhosis s/p TIPS on 11/19/23.  Patient had fluid overload following the TIPS and required a prolonged recovery in the hospital.  She was doing well until she was admitted on 12/10/23 due to altered mental status likely from not taking the lactulose . She has not required a paracentesis since the TIPS and her weight has been stable.  Currently, patient does not report any mental status problems.  Her main  complaint is the loose stools and difficulty sleeping.   Although she is complaining of the loose stools, she is only having 2 stools per day and I am reluctant to decrease her lactulose  (currently 30 g BID) due to risk of encephalopathy.  She is scheduled to see GI on 01/20/23.  Plan for routine follow up in IR in 6 months with liver duplex / TIPS US .      I spent a total of    15 Minutes in remote  clinical consultation, greater than 50% of which was counseling/coordinating care for post  TIPS.    Visit type: Audio and video (EPIC).    Patient ID: Andrea Dickson, female   DOB: 1950-09-11, 73 y.o.   MRN: [id number]

## 2024-01-19 NOTE — Progress Notes (Signed)
 "  GI Office Note    Referring Provider: Lari Elspeth BRAVO, MD Primary Care Physician:  Lari Elspeth BRAVO, MD Primary Gastroenterologist: Lamar HERO.Rourk, MD  Date:  01/20/2024  ID:  Andrea Dickson, DOB Nov 20, 1950, MRN [id number]   Chief Complaint   Chief Complaint  Patient presents with   Follow-up    Follow up. Stomach cramps down low and sometimes feels nauseated    History of Present Illness  Andrea Dickson is a 74 y.o. female with a history of cirrhosis, chronic diarrhea nausea, HLD, essential tremor, bipolar, and diabetes presenting today for cirrhosis follow up.   EGD 01/19/2022: -3 columns of grade 2/limited grade 3 varices -Portal hypertensive gastropathy -Friable gastric mucosa -Continue Inderal  daily -No repeat EGD needed as long as no evidence of upper GI bleed   Colonoscopy Jan 2020: colonic lipoma, tubular adenomas, random colonic biopsies negative.    Underwent paracentesis in June 2024.   CTA/BRTO protocol 08/17/2022: -Cirrhosis -Evidence of portal hypertension with ascites and splenomegaly -Small hiatal hernia -Decompressed gallbladder -No evidence of portal vein thrombosis   OV 09/17/2022.  Patient reported history of chronic diarrhea with evaluation that included a trial of Xifaxan  in January 2024 of IBS-D without any significant improvement.  Continues to have intermittent soft and loose stools.  Noted to have prior negative workup of negative colon biopsies, celiac screen negative, and trialed off metformin with no improvement.  Fecal elastase normal.  Dicyclomine  also not helpful in the past.  Was given trial of Zenpep  samples with some improvement.  Still sometimes has diarrhea no matter what she eats.  Denied any abdominal distention, confusion, mental status changes, jaundice, or pruritus.  Given increasing creatinine she is currently doing diuretics every other day initially and then went back to every day given increase of fluid in her extremities.    On 10/7 -patient reported feeling like she needed paracentesis, and I recommended that she could obtain a para at [facility] to remove no more than 4 L and give 25 g of albumin .  Appears patient was referred to nephrology in the latter part of September.   EGD 09/25/2022: -Grade 3 esophageal varices -Portal hypertensive gastropathy -Normal duodenum -Advised platelets -Start propranolol  80 mg daily after colonoscopy   Colonoscopy 09/26/2022: -Hemorrhoids and perianal exam, internal hemorrhoids -Two 2-4 mm polyps in the descending and ascending colon -2-3-4 mm polyps in the descending colon -path: tubular adenomas -Repeat in 5 years.    OV 10/26/22. Stools start normal then turn loose. Wearing depends just in case. Has urgency. Zenpep  worked briefly for a few days and then reported a return of symptoms.  Reportedly having relief with Levsin .  She is taking her diuretics as prescribed as she states she cannot breathe if she does not take them.  Had reported some very little BRBPR.  Seeing nephrology.  Taking Zofran  in the morning to help with nausea as well as continuing her PPI twice daily.  She has stopped Imodium at recommendation of PCP.  They also removed her Trimpex  and ibuprofen .  Recalled for ultrasound in February.  Advised to continue Lasix  20 and Aldactone  50 mg daily.  Continue propranolol  as well as omeprazole .  Increased Zenpep  to 60,000 units with meals.  Advised another trial of Xifaxan  if no improvement with Zenpep .  Complete MELD labs with AFP in 3 months.  Discussed 2 g sodium diet.   OV  3/37/73. Prior to this OV her diuretics were increased (lasix  40mg , aldactone  100mg ) given weight  gain and advised recheck of labs.  This visit she presented with significant fatigue and having issues with mobility at home and some frequent falls.  Reports more fluid in her belly feeling very distended in the mornings although does have some mild improvement after taking diuretics.  Reported she is  seeing some black stool but not any blood in her stool.  Reported bruising/petechiae on her skin on her arms and legs.  Reportedly eating mostly chips and dips as well as tomato soup, not eating much meat.  He recently quit smoking.  Continues to take Zenpep  60,000 units with meals and only having 3-4 BMs daily.  Labs ordered as well as right upper quadrant ultrasound and EGD ASAP to assess for bleeding varices.  Advised continue Zenpep , omeprazole , and diuretics at current dose.  Reinforced need for 2 g sodium diet.   RUQ US  04/12/23: - cirrhosis -no focal liver lesion   EGD 04/19/23: - Grade II esophageal varices with no bleeding and no stigmata of recent bleeding.  - Portal hypertensive gastropathy.  - Normal duodenal bulb, first portion of the duodenum and second portion of the duodenum.  - No specimens collected. - Consider TIPS evaluation given ongoing issues with hypervolemia and sensitivity to diuretics.  - Patient distended today. Will order paracentesis.  - Follow up in GI office in 4- 6 weeks  - Consider capsule endoscopy for anemia.   IR eval for TIPS 4/16: Reported to be a good candidate for TIPS however she will want to discuss this with her family and see how currently ascites reaccumulate's.  Plan for 58-month follow-up and rediscuss at that time.   Last office visit 06/09/23.Taking diuretics but still having distention. 2-3 BM daily on average although no BM in couple days recently. History of varices without bleeding. Bloating after meals and staying fatigued. Having some back pain. Taking Zenpep  only once daily. Despite not wanting to eat she snacks often and usually is dips, chips, pickles, and tomato soup. Advised givens capsule, labs, 2g sodium diet. Protein intake encouraged and continue diuretics . Continue zenpep  and pantoprazole . Also continue NSBB (propranolol ). Again encouraged TIPs.    Capsule study 06/16/23: Portal hypertensive enteropathy and linear erosions noted. Some  views obscured. Anemia suspected to be secondary ot portal gastropathy and enteropathy. Continued to recommend TIPS and monitoring for GI bleeding and regular labs.    CT angio A/P 07/27/23: 1. Portal venous system is patent. No significant gastric or esophageal varices. 2. Main hepatic veins are patent. 3. Atherosclerotic disease in the abdominal aorta without aneurysm. 4. Cirrhosis with portal hypertension. Splenomegaly and ascites has progressed since the exam on 08/17/2022.   Has been completing paracentesis every 2 weeks since 06/16/23 obtaining average of 5L removed. No SBP.    Echo 08/31/23:  -EF 70-75% -Grade 1 diastolic dysfunction.   Last office visit 09/07/23.  Had issues with lower extremity weakness and falls, fluid overload, advised update labs including hemoglobin and iron panel.  Repeat EGD if drop in hemoglobin.  Discussed her malnutrition and muscle wasting secondary to her liver disease.  Advised ultrasound in 6 months and for her to rediscuss TIPS.  Continue paracenteses as needed.  Advised continue omeprazole  for GERD and Zofran  as needed for nausea  Underwent TIPS 11/19/2023, was subsequently admitted to the hospital given chest pain post procedure. She was treated with an IV Lasix  given some shortness of breath, chest x-ray with slight left pleural effusion.  EKG without any elevation ST segment.  Chest  pain felt to be secondary to her HFpEF.  Decided to be cautious with ischemic testing given requirement of contrast.  Cardiology advised outpatient follow-up.  Hospitalization for HE 12/10/23 - 12/13/23.  Had not been taking her lactulose  and presented with altered mental status.  Her ammonia was elevated in the ED and she was restarted on her lactulose .  CT brain negative, UA negative.  B12 65, TSH 0.978, folate 19.9.  Her lactulose  was at 30 g 3 times daily.  She also had some mild lactic acidosis with blood cultures that were negative as well.  Propranolol  was decreased due to  soft blood pressures.  Follow up with IR 12/28/23.  Having occasional left abdominal cramping, weight stable.  Does not feel distended.  No paracentesis since TIPS.  Occasional central chest pain that has been having since even prior to TIPS with episode occurring a week prior.  Having decreased right lower extremity swelling, awaiting compression stockings.  Propranolol  dose decreased and has been having some increase shaking since then.  Appetite good and stable.  Still having diarrhea with 2 bowel movements per day.  Plan was to keep her lactulose  at 30 g twice daily.  Plan for 56-month liver duplex/TIPS ultrasound.    Today:  MELD 3.0: 15 at 11/23/2023  2:49 AM MELD-Na: 14 at 11/23/2023  2:49 AM Calculated from: Serum Creatinine: 1.13 mg/dL at 88/88/7974  7:50 AM Serum Sodium: 140 mmol/L (Using max of 137 mmol/L) at 11/23/2023  2:49 AM Total Bilirubin: 1.9 mg/dL at 88/89/7974  4:71 AM Serum Albumin : 3 g/dL at 88/89/7974  4:71 AM INR(ratio): 1.4 at 11/22/2023  5:28 AM Age at listing (hypothetical): 73 years Sex: Female at 11/23/2023  2:49 AM   Hepatoma screening: CT A/P July 2025 without evidence of hepatoma.  Has had multiple paracenteses and IR for TIPS but does not include hepatoma screening. AFP: 2.17 March 2023 EGD: April 2025 with grade 2 varices without bleeding. BB: Propranolol  80 mg daily Ascites/peripheral edema: S/p TIPS - last paracentesis 11/19/2023 on day of TIPS Diuretics: Lasix  40 mg and Aldactone  100 mg daily Paracentesis:  History of SBP: no Encephalopathy: Recent admission end of November given not taking lactulose .   Discussed the use of AI scribe software for clinical note transcription with the patient, who gave verbal consent to proceed.  Underwent TIPS procedure 8-10 weeks ago for management of portal hypertension and ascites. Post-procedure course included transient chest pain without significant complications. Hospitalized from the day after Thanksgiving until  December 1st for confusion and abnormal behavior, including attempting to eat a blanket. During this admission, lactulose  was initiated three times daily, resulting in daily liquid bowel movements. Reported improvement in breathing and overall strength since TIPS.  Currently takes lactulose  20 mL twice daily, typically at 9 AM and 9 PM. Bowel movements occur two to three times daily, with the first being formed and subsequent stools loose or diarrheal. Experiences abdomi nal cramping and gas, with pain localized to the lower abdomen that improves after defecation. Dislikes loose stools but tolerates the taste of lactulose . No further episodes of confusion since resuming regular lactulose  dosing. Occasionally has difficulty sleeping after the nighttime dose, sometimes staying awake until early morning.  Also takes Zenpep  for malabsorption and omeprazole  for GERD, with no recent need for refills. Continues to use compression socks for lower extremity edema, which worsens when not worn.  History of falls, including a significant fall in the hospital resulting in head contusions without serious injury. Previously required a  boot and scooter, now discontinued. Completed physical therapy and regained strength and mobility, currently living independently without recent falls. Lives alone and uses a life alert device for safety.  Chronic thrombocytopenia with easy bruising, developing bruises with minimal trauma. Propranolol  dose was reduced during hospitalization from 80 mg extended release daily to 10 mg twice daily. Initially experienced increased tremors after dose reduction, which have since improved but occasionally recur. Denies current reflux symptoms. Reports ears are stopped up and some trouble hearing.      Wt Readings from Last 6 Encounters:  01/20/24 143 lb 12.8 oz (65.2 kg)  12/10/23 146 lb (66.2 kg)  12/07/23 150 lb (68 kg)  11/22/23 158 lb 15.2 oz (72.1 kg)  09/07/23 165 lb 3.2 oz (74.9 kg)   06/09/23 168 lb (76.2 kg)    Body mass index is 23.21 kg/m.   Current Outpatient Medications  Medication Sig Dispense Refill   acetaminophen  (TYLENOL ) 500 MG tablet Take 1,000 mg by mouth 2 (two) times daily as needed for moderate pain or headache.     buPROPion  (WELLBUTRIN  XL) 150 MG 24 hr tablet Take 1 tablet (150 mg total) by mouth every morning. 90 tablet 2   Cholecalciferol (VITAMIN D3) 50 MCG (2000 UT) TABS Take 2,000 Units by mouth daily.      diazepam  (VALIUM ) 2 MG tablet Take 1 tablet (2 mg total) by mouth daily as needed for anxiety. 90 tablet 2   estrogens , conjugated, (PREMARIN ) 0.625 MG tablet Take 0.625 mg by mouth daily.     ezetimibe  (ZETIA ) 10 MG tablet Take 10 mg by mouth at bedtime.     fluticasone (FLONASE) 50 MCG/ACT nasal spray Place 2 sprays into both nostrils daily as needed for rhinitis or allergies.     furosemide  (LASIX ) 40 MG tablet Take 1 tablet (40 mg total) by mouth daily. 30 tablet 11   hyoscyamine  (LEVSIN  SL) 0.125 MG SL tablet Place 1 tablet (0.125 mg total) under the tongue every 6 (six) hours as needed for cramping. 30 tablet 1   Lactobacillus-Inulin (CULTURELLE DIGESTIVE HEALTH) CAPS Take 1 capsule by mouth daily.     lactulose  (CHRONULAC ) 10 GM/15ML solution Take 45 mLs (30 g total) by mouth in the morning and at bedtime. 2700 mL 6   mirtazapine  (REMERON ) 15 MG tablet Take 1 tablet (15 mg total) by mouth at bedtime. 90 tablet 2   nitroGLYCERIN  (NITROSTAT ) 0.4 MG SL tablet Place 1 tablet (0.4 mg total) under the tongue every 5 (five) minutes as needed for chest pain. 25 tablet 3   omeprazole  (PRILOSEC) 40 MG capsule TAKE ONE (1) CAPSULE BY MOUTH TWICE DAILY AT BREAKFAST AND AT BEDTIME 60 capsule 11   ondansetron  (ZOFRAN ) 4 MG tablet TAKE 1 TABLET BY MOUTH EVERY 8 HOURS AS NEEDED FOR NAUSEA & VOMITING 30 tablet 10   Pancrelipase , Lip-Prot-Amyl, (ZENPEP ) 60000-189600 units CPEP Take 1 capsule by mouth 3 (three) times daily with meals. 100 capsule 11    propranolol  (INDERAL ) 10 MG tablet Take 1 tablet (10 mg total) by mouth 2 (two) times daily. 60 tablet 1   spironolactone  (ALDACTONE ) 100 MG tablet Take 1 tablet (100 mg total) by mouth daily. 30 tablet 11   trimethoprim  (TRIMPEX ) 100 MG tablet TAKE 1 TABLET BY MOUTH EVERY DAY AT BEDTIME 289 tablet 11   No current facility-administered medications for this visit.    Past Medical History:  Diagnosis Date   Anxiety    Asthma    Bipolar 1 disorder (HCC)  Chronic diarrhea    Cirrhosis (HCC)    Diagnosed September 2020, likely secondary to [facility], s/p Hep A/B vaccination in 2021.    Depression    Diabetes (HCC)    Essential tremor    GERD (gastroesophageal reflux disease)    Headache    High cholesterol     Past Surgical History:  Procedure Laterality Date   ABDOMINAL HYSTERECTOMY  1997   BIOPSY  01/17/2018   Procedure: BIOPSY;  Surgeon: Shaaron Lamar HERO, MD;  [facility];  Service: Endoscopy;;  colon   carpal tunnel Bilateral 1999, 06/2009   COLONOSCOPY  2013   Dr. Donnel: no colon polyps. quality of prep was fair. repeat colonoscopy in five years.    COLONOSCOPY WITH PROPOFOL  N/A 01/17/2018   Dr. Shaaron: Colonic lipoma, 2 tubular adenomas removed.  Random colon biopsies negative.  Next colonoscopy 5 years.   COLONOSCOPY WITH PROPOFOL  N/A 09/26/2022   Procedure: COLONOSCOPY WITH PROPOFOL ;  Surgeon: Eartha Angelia Sieving, MD;  [facility];  Service: Gastroenterology;  Laterality: N/A;   ESOPHAGOGASTRODUODENOSCOPY N/A 04/19/2023   Procedure: EGD (ESOPHAGOGASTRODUODENOSCOPY);  Surgeon: Cindie Carlin POUR, DO;  [facility];  Service: Endoscopy;  Laterality: N/A;  1:45 pm, asa 3   ESOPHAGOGASTRODUODENOSCOPY (EGD) WITH PROPOFOL  N/A 04/06/2019   Procedure: ESOPHAGOGASTRODUODENOSCOPY (EGD) WITH PROPOFOL ;  Surgeon: Shaaron Lamar HERO, MD;   Normal esophagus, mild portal hypertensive gastropathy, otherwise normal exam.  Recommend repeat EGD in 2 years for variceal  screening.    ESOPHAGOGASTRODUODENOSCOPY (EGD) WITH PROPOFOL  N/A 01/19/2022   Procedure: ESOPHAGOGASTRODUODENOSCOPY (EGD) WITH PROPOFOL ;  Surgeon: Shaaron Lamar HERO, MD;  [facility];  Service: Endoscopy;  Laterality: N/A;  12:45 pm, pt knows to arrive at 9:15   ESOPHAGOGASTRODUODENOSCOPY (EGD) WITH PROPOFOL  N/A 09/25/2022   Procedure: ESOPHAGOGASTRODUODENOSCOPY (EGD) WITH PROPOFOL ;  Surgeon: Eartha Angelia Sieving, MD;  [facility];  Service: Gastroenterology;  Laterality: N/A;   GIVENS CAPSULE STUDY N/A 06/16/2023   Procedure: IMAGING PROCEDURE, GI TRACT, INTRALUMINAL, VIA CAPSULE;  Surgeon: Cindie Carlin POUR, DO;  [facility];  Service: Endoscopy;  Laterality: N/A;  730am   IR INTRAVASCULAR ULTRASOUND NON CORONARY  11/19/2023   IR PARACENTESIS  11/19/2023   IR RADIOLOGIST EVAL & MGMT  04/28/2023   IR RADIOLOGIST EVAL & MGMT  12/28/2023   IR TIPS  11/19/2023   IR US  GUIDE VASC ACCESS RIGHT  11/19/2023   IR US  GUIDE VASC ACCESS RIGHT  11/19/2023   POLYPECTOMY  01/17/2018   Procedure: POLYPECTOMY;  Surgeon: Shaaron Lamar HERO, MD;  [facility];  Service: Endoscopy;;  colon   POLYPECTOMY  09/26/2022   Procedure: POLYPECTOMY;  Surgeon: Eartha Angelia Sieving, MD;  [facility];  Service: Gastroenterology;;   SKIN CANCER EXCISION  2007   TIPS PROCEDURE N/A 11/19/2023   Procedure: INSERTION, SHUNT, INTRAHEPATIC PORTOSYSTEMIC, TRANSJUGULAR;  Surgeon: Philip Cornet, MD;  Location: [facility] OR;  Service: Radiology;  Laterality: N/A;   TONSILLECTOMY  1965   ulner nerve  Left 06/2009   decompression    Family History  Problem Relation Age of Onset   Diabetes Mother    Brain cancer Mother    Diabetes Father    Heart attack Father    Heart disease Father    Diabetes Sister    Bipolar disorder Brother    Diabetes Brother    Heart disease Brother    Colon cancer Neg Hx    Breast cancer Neg Hx  Allergies as of 01/20/2024 - Review Complete 01/20/2024   Allergen Reaction Noted   Pramipexole Other (See Comments) 04/24/2015   Crestor [rosuvastatin calcium ] Other (See Comments) 03/28/2019   Bee pollen Other (See Comments) 04/24/2015   Pollen extract Other (See Comments) 04/24/2015    Social History   Socioeconomic History   Marital status: Divorced    Spouse name: Not on file   Number of children: 1   Years of education: 12   Highest education level: Not on file  Occupational History   Occupation: retired    Comment: employed at [employer]  Tobacco Use   Smoking status: Former    Current packs/day: 0.00    Average packs/day: 0.5 packs/day for 43.0 years (21.5 ttl pk-yrs)    Types: Cigarettes    Quit date: 2024    Years since quitting: 2.0   Smokeless tobacco: Never   Tobacco comments:    smoking since 74yrs old.    Vaping Use   Vaping status: Never Used  Substance and Sexual Activity   Alcohol use: No    Alcohol/week: 0.0 standard drinks of alcohol   Drug use: No   Sexual activity: Not Currently  Other Topics Concern   Not on file  Social History Narrative   Lives alone.  Retired.  Education 12th grade.  One child.     Caffeine use- sodas, 2 daily   Social Drivers of Health   Tobacco Use: Medium Risk (01/20/2024)   Patient History    Smoking Tobacco Use: Former    Smokeless Tobacco Use: Never    Passive Exposure: Not on file  Financial Resource Strain: Low Risk (11/10/2021)   [facility]   Overall Financial Resource Strain (CARDIA)    Difficulty of Paying Living Expenses: Not hard at all  Food Insecurity: No Food Insecurity (12/14/2023)   Epic    Worried About Radiation Protection Practitioner of Food in the Last Year: Never true    Ran Out of Food in the Last Year: Never true  Transportation Needs: No Transportation Needs (12/14/2023)   Epic    Lack of Transportation (Medical): No    Lack of Transportation (Non-Medical): No  Physical Activity: Inactive (11/10/2021)   Received from [facility]   Exercise  Vital Sign    On average, how many days per week do you engage in moderate to strenuous exercise (like a brisk walk)?: 0 days    On average, how many minutes do you engage in exercise at this level?: 0 min  Stress: No Stress Concern Present (11/10/2021)   Received from [facility] - Occupational Stress Questionnaire    Feeling of Stress : Not at all  Social Connections: Socially Isolated (11/19/2023)   Social Connection and Isolation Panel    Frequency of Communication with Friends and Family: More than three times a week    Frequency of Social Gatherings with Friends and Family: More than three times a week    Attends Religious Services: Never    Database Administrator or Organizations: No    Attends Banker Meetings: Never    Marital Status: Divorced  Depression (PHQ2-9): Low Risk (12/14/2023)   Depression (PHQ2-9)    PHQ-2 Score: 0  Alcohol Screen: Not on file  Housing: Low Risk (12/14/2023)   Epic    Unable to Pay for Housing in the Last Year: No    Number of Times Moved in the  Last Year: 0    Homeless in the Last Year: No  Utilities: Not At Risk (12/14/2023)   Epic    Threatened with loss of utilities: No  Health Literacy: Medium Risk (11/10/2021)   Received from [facility] Literacy    How often do you need to have someone help you when you read instructions, pamphlets, or other written material from your doctor or pharmacy?: Sometimes    Review of Systems   Gen: Denies fever, chills, anorexia. Denies fatigue, weakness, weight loss.  CV: + intermittent chest discomfort. Denies syncope, peripheral edema, and claudication. Resp: Denies dyspnea at rest, cough, wheezing, coughing up blood, and pleurisy. GI: See HPI Derm: Denies rash, itching, dry skin Psych: Denies depression, anxiety, memory loss, confusion. No homicidal or suicidal ideation.  Neuro: + mild tremors Heme: Denies bruising, bleeding, and  enlarged lymph nodes.  Physical Exam   BP 136/63 (BP Location: Right Arm, Patient Position: Sitting, Cuff Size: Normal)   Pulse 75   Temp 97.7 F (36.5 C) (Temporal)   Ht 5' 6 (1.676 m)   Wt 143 lb 12.8 oz (65.2 kg)   BMI 23.21 kg/m   General:   Alert and oriented. No distress noted. Pleasant and cooperative.  Head:  Normocephalic and atraumatic. Eyes:  Conjuctiva clear without scleral icterus. Lungs:  Clear to auscultation bilaterally. No wheezes, rales, or rhonchi. No distress.  Heart:  S1, S2 present without murmurs appreciated.  Abdomen:  +BS, soft, non-distended. Mild ttp to LLQ. No rebound or guarding. No HSM or masses noted. Rectal: deferred Msk:  Symmetrical without gross deformities. Normal posture. Normal gait.  Extremities:  Without edema. Compression stockings in place.  Neurologic:  Alert and  oriented x4. No asterixis. Mild tremors baseline to bilateral hands.  Psych:  Alert and cooperative. Normal mood and affect.  Assessment & Plan  Andrea Dickson is a 74 y.o. female presenting today for cirrhosis follow up.      Cirrhosis with ascites and portal hypertension, status post TIPS History of MASH cirrhosis with prior ascites requiring weekly paracenteses.  Status post-TIPS now 11/19/2023, she demonstrates well-controlled edema and without any significant abdominal distention or ascites and stable vital signs. Chronic thrombocytopenia and easy bruising persist, consistent with advanced liver disease.  Currently due for hepatoma screening.  Last EGD April 2025, not currently due for surveillance.  May consider repeating EGD after next follow-up pending labs.  Has had 1 instance of hepatic encephalopathy as below. - Ordered RUQ US  for hepatoma screening - Ordered MELD labs, AFP, and iron levels. - Continue propranolol  10 mg twice daily -if BP and heart rate tolerating at next visit, may consider brief increase to also help with her tremors.  Goal heart rate 50-60  although variceal prophylaxis should be improved post TIPS - Continued compression stockings for lower extremity edema. - MELD labs every 6 months - US  every 6 months - 2g sodium diet  Hepatic encephalopathy She remains free of confusion and drowsiness on lactulose  30g BID since her discharge the first of December, with bowel movement frequency at borderline target. Goal BM 3 semi formed stools daily.  Currently having 2 semiformed bowel movements daily followed by frequent small amounts of loose/watery stools.  Nocturnal symptoms are present given she takes lactulose  at 9 PM but tolerable; alternative therapy may be considered if side effects worsen. - Continued lactulose  30 g twice daily, with administration in the morning and at dinnertime to reduce nocturnal symptoms. -  Monitored bowel movement frequency and consistency, targeting 2-3 semiformed per day. - Monitored for recurrence of confusion or drowsiness. - Considered dose reduction of lactulose  if nocturnal symptoms persist, with close monitoring for encephalopathy. - Considered initiation of rifaximin  if lactulose  side effects become intolerable or if bowel movement frequency increases excessively.  Chronic diarrhea, IBS, malabsorption She experiences ongoing loose stools and abdominal cramping, likely secondary to lactulose , with pancreatic enzyme therapy continued for malabsorption history and chronic diarrhea given other treatments have been ineffective. Symptoms are currently manageable. - Continued Zenpep  60K capsule - one with meals - Monitored stool consistency and frequency. - Adjusted lactulose  timing to minimize nocturnal symptoms. - Monitored for abdominal cramping and gas; will reassess if symptoms worsen.  GERD Reflux symptoms are well controlled on omeprazole  40 mg daily, with no new complaints or need for medication adjustment. - Continue omeprazole  40 mg daily     Follow up   Follow up 3 months.    Charmaine Melia, MSN, FNP-BC, AGACNP-BC [facility] "

## 2024-01-20 ENCOUNTER — Ambulatory Visit (INDEPENDENT_AMBULATORY_CARE_PROVIDER_SITE_OTHER): Admitting: Gastroenterology

## 2024-01-20 ENCOUNTER — Encounter: Payer: Self-pay | Admitting: *Deleted

## 2024-01-20 ENCOUNTER — Encounter: Payer: Self-pay | Admitting: Gastroenterology

## 2024-01-20 VITALS — BP 136/63 | HR 75 | Temp 97.7°F | Ht 66.0 in | Wt 143.8 lb

## 2024-01-20 DIAGNOSIS — K529 Noninfective gastroenteritis and colitis, unspecified: Secondary | ICD-10-CM | POA: Diagnosis not present

## 2024-01-20 DIAGNOSIS — K766 Portal hypertension: Secondary | ICD-10-CM | POA: Diagnosis not present

## 2024-01-20 DIAGNOSIS — D649 Anemia, unspecified: Secondary | ICD-10-CM

## 2024-01-20 DIAGNOSIS — Z9889 Other specified postprocedural states: Secondary | ICD-10-CM | POA: Diagnosis not present

## 2024-01-20 DIAGNOSIS — K580 Irritable bowel syndrome with diarrhea: Secondary | ICD-10-CM

## 2024-01-20 DIAGNOSIS — R188 Other ascites: Secondary | ICD-10-CM | POA: Diagnosis not present

## 2024-01-20 DIAGNOSIS — K9089 Other intestinal malabsorption: Secondary | ICD-10-CM

## 2024-01-20 DIAGNOSIS — K7460 Unspecified cirrhosis of liver: Secondary | ICD-10-CM

## 2024-01-20 DIAGNOSIS — K7290 Hepatic failure, unspecified without coma: Secondary | ICD-10-CM | POA: Diagnosis not present

## 2024-01-20 DIAGNOSIS — K219 Gastro-esophageal reflux disease without esophagitis: Secondary | ICD-10-CM | POA: Diagnosis not present

## 2024-01-20 DIAGNOSIS — K7682 Hepatic encephalopathy: Secondary | ICD-10-CM

## 2024-01-20 NOTE — Patient Instructions (Addendum)
 Cirrhosis Lifestyle Recommendations:  High-protein diet from a primarily plant-based diet. Avoid red meat.  No raw or undercooked meat, seafood, or shellfish. Low-fat/cholesterol/carbohydrate diet. Limit sodium to no more than 2000 mg/day including everything that you eat and drink. Recommend at least 30 minutes of aerobic and resistance exercise 3 days/week. Limit Tylenol  to 2000 mg daily.   Continue lactulose  30 g twice daily but I want you to start taking this in the morning and then you can take at dinnertime around 6 PM to try to avoid any lingering bouts of looser stool overnight.  Pending how you are doing at next visit we may can try decreasing your dose however I would like for you to have about 3 semiformed bowel movements a day.   Continue taking Lasix  and Aldactone  as prescribed as well as your pancreatic enzymes with meals.  Continue omeprazole  40 mg daily for your reflux.  We will get you scheduled for your ultrasound in the near future along with your labs.  Please take your labs this with you to your ultrasound appointment.  We will follow-up in 3 months, sooner if needed.  If you develop any recurrent confusion, dizziness, ongoing drowsiness then please contact the office.  At that point we may need to add Xifaxan  onto your medication regimen.  It was a pleasure to see you today. I want to create trusting relationships with patients. If you receive a survey regarding your visit,  I greatly appreciate you taking time to fill this out on paper or through your MyChart. I value your feedback.  Charmaine Melia, MSN, FNP-BC, AGACNP-BC [facility]

## 2024-01-25 ENCOUNTER — Encounter (HOSPITAL_COMMUNITY): Payer: Self-pay | Admitting: Psychiatry

## 2024-01-25 ENCOUNTER — Telehealth (HOSPITAL_COMMUNITY): Admitting: Psychiatry

## 2024-01-25 DIAGNOSIS — F3162 Bipolar disorder, current episode mixed, moderate: Secondary | ICD-10-CM | POA: Diagnosis not present

## 2024-01-25 DIAGNOSIS — F411 Generalized anxiety disorder: Secondary | ICD-10-CM | POA: Diagnosis not present

## 2024-01-25 MED ORDER — MIRTAZAPINE 15 MG PO TABS: 15.0000 mg | ORAL_TABLET | Freq: Every day | ORAL | 2 refills | Status: AC

## 2024-01-25 MED ORDER — PROPRANOLOL HCL 10 MG PO TABS: 10.0000 mg | ORAL_TABLET | Freq: Two times a day (BID) | ORAL | 2 refills | Status: AC

## 2024-01-25 MED ORDER — BUPROPION HCL ER (XL) 150 MG PO TB24: 150.0000 mg | ORAL_TABLET | ORAL | 2 refills | Status: AC

## 2024-01-25 MED ORDER — MIRTAZAPINE 15 MG PO TABS: 15.0000 mg | ORAL_TABLET | Freq: Every day | ORAL | 2 refills | Status: DC

## 2024-01-25 MED ORDER — BUPROPION HCL ER (XL) 150 MG PO TB24: 150.0000 mg | ORAL_TABLET | ORAL | 2 refills | Status: DC

## 2024-01-25 MED ORDER — DIAZEPAM 2 MG PO TABS: 2.0000 mg | ORAL_TABLET | Freq: Every day | ORAL | 2 refills | Status: AC | PRN

## 2024-01-25 MED ORDER — DIAZEPAM 2 MG PO TABS: 2.0000 mg | ORAL_TABLET | Freq: Every day | ORAL | 2 refills | Status: DC | PRN

## 2024-01-25 MED ORDER — PROPRANOLOL HCL 10 MG PO TABS: 10.0000 mg | ORAL_TABLET | Freq: Two times a day (BID) | ORAL | 2 refills | Status: DC

## 2024-01-25 NOTE — Progress Notes (Signed)
 Virtual Visit via Video Note  I connected with Andrea Dickson on 01/25/2024 at  1:20 PM EST by a video enabled telemedicine application and verified that I am speaking with the correct person using two identifiers.  Location: Patient: home Provider: office   I discussed the limitations of evaluation and management by telemedicine and the availability of in person appointments. The patient expressed understanding and agreed to proceed.    I discussed the assessment and treatment plan with the patient. The patient was provided an opportunity to ask questions and all were answered. The patient agreed with the plan and demonstrated an understanding of the instructions.   The patient was advised to call back or seek an in-person evaluation if the symptoms worsen or if the condition fails to improve as anticipated.  I provided 20 minutes of non-face-to-face time during this encounter.   Barnie Gull, MD  [facility] MD/PA/NP OP Progress Note  01/25/2024 1:32 PM Andrea Dickson  MRN:  [id number]  Chief Complaint:  Chief Complaint  Patient presents with   Anxiety   Depression   Follow-up   HPI: This patient is a 74 year old separated white female who lives alone in [place]. She has 1 son and 3 grandchildren. She is retired from [employer]. She has a history of depression anxiety and possible bipolar disorder   The patient returns for follow-up after 3 months regarding her major depression generalized anxiety and tremor.  She had a TIPS procedure done in November for her idiopathic liver cirrhosis.  Following that she went home and did not take her lactulose  for several days and developed metabolic encephalopathy.  She was put back in the hospital the day after Thanksgiving.  While there her propranolol  LA was changed to just propranolol  10 mg twice daily because her blood pressures had dropped.  Her tremor is a bit worse than it used to be but she does not want to go back on a higher dose of  propranolol .  Her confusion abated when she got back on the lactulose  and went through diuresis.  She is now back home.  She is back on the lactulose  but is causing frequent diarrhea.  Her GI provider is aware of this.  She is also on diuretics which causes nighttime frequency of urination and is making it hard for her to stay asleep.  She continues to lose weight and is down to 138 pounds.  Despite this she states that her mood is good and she denies significant depression anxiety thoughts of self-harm or suicide. Visit Diagnosis:    ICD-10-CM   1. Bipolar 1 disorder, mixed, moderate (HCC)  F31.62       Past Psychiatric History: Past outpatient treatment  Past Medical History:  Past Medical History:  Diagnosis Date   Anxiety    Asthma    Bipolar 1 disorder (HCC)    Chronic diarrhea    Cirrhosis (HCC)    Diagnosed September 2020, likely secondary to [facility], s/p Hep A/B vaccination in 2021.    Depression    Diabetes (HCC)    Essential tremor    GERD (gastroesophageal reflux disease)    Headache    High cholesterol     Past Surgical History:  Procedure Laterality Date   ABDOMINAL HYSTERECTOMY  1997   BIOPSY  01/17/2018   Procedure: BIOPSY;  Surgeon: Shaaron Lamar HERO, MD;  [facility];  Service: Endoscopy;;  colon   carpal tunnel Bilateral 1999, 06/2009   COLONOSCOPY  2013  Dr. Donnel: no colon polyps. quality of prep was fair. repeat colonoscopy in five years.    COLONOSCOPY WITH PROPOFOL  N/A 01/17/2018   Dr. Shaaron: Colonic lipoma, 2 tubular adenomas removed.  Random colon biopsies negative.  Next colonoscopy 5 years.   COLONOSCOPY WITH PROPOFOL  N/A 09/26/2022   Procedure: COLONOSCOPY WITH PROPOFOL ;  Surgeon: Eartha Angelia Sieving, MD;  [facility];  Service: Gastroenterology;  Laterality: N/A;   ESOPHAGOGASTRODUODENOSCOPY N/A 04/19/2023   Procedure: EGD (ESOPHAGOGASTRODUODENOSCOPY);  Surgeon: Cindie Carlin POUR, DO;  [facility];  Service:  Endoscopy;  Laterality: N/A;  1:45 pm, asa 3   ESOPHAGOGASTRODUODENOSCOPY (EGD) WITH PROPOFOL  N/A 04/06/2019   Procedure: ESOPHAGOGASTRODUODENOSCOPY (EGD) WITH PROPOFOL ;  Surgeon: Shaaron Lamar HERO, MD;   Normal esophagus, mild portal hypertensive gastropathy, otherwise normal exam.  Recommend repeat EGD in 2 years for variceal screening.    ESOPHAGOGASTRODUODENOSCOPY (EGD) WITH PROPOFOL  N/A 01/19/2022   Procedure: ESOPHAGOGASTRODUODENOSCOPY (EGD) WITH PROPOFOL ;  Surgeon: Shaaron Lamar HERO, MD;  [facility];  Service: Endoscopy;  Laterality: N/A;  12:45 pm, pt knows to arrive at 9:15   ESOPHAGOGASTRODUODENOSCOPY (EGD) WITH PROPOFOL  N/A 09/25/2022   Procedure: ESOPHAGOGASTRODUODENOSCOPY (EGD) WITH PROPOFOL ;  Surgeon: Eartha Angelia Sieving, MD;  [facility];  Service: Gastroenterology;  Laterality: N/A;   GIVENS CAPSULE STUDY N/A 06/16/2023   Procedure: IMAGING PROCEDURE, GI TRACT, INTRALUMINAL, VIA CAPSULE;  Surgeon: Cindie Carlin POUR, DO;  [facility];  Service: Endoscopy;  Laterality: N/A;  730am   IR INTRAVASCULAR ULTRASOUND NON CORONARY  11/19/2023   IR PARACENTESIS  11/19/2023   IR RADIOLOGIST EVAL & MGMT  04/28/2023   IR RADIOLOGIST EVAL & MGMT  12/28/2023   IR TIPS  11/19/2023   IR US  GUIDE VASC ACCESS RIGHT  11/19/2023   IR US  GUIDE VASC ACCESS RIGHT  11/19/2023   POLYPECTOMY  01/17/2018   Procedure: POLYPECTOMY;  Surgeon: Shaaron Lamar HERO, MD;  [facility];  Service: Endoscopy;;  colon   POLYPECTOMY  09/26/2022   Procedure: POLYPECTOMY;  Surgeon: Eartha Angelia Sieving, MD;  [facility];  Service: Gastroenterology;;   SKIN CANCER EXCISION  2007   TIPS PROCEDURE N/A 11/19/2023   Procedure: INSERTION, SHUNT, INTRAHEPATIC PORTOSYSTEMIC, TRANSJUGULAR;  Surgeon: Philip Cornet, MD;  Location[facility] OR;  Service: Radiology;  Laterality: N/A;   TONSILLECTOMY  1965   ulner nerve  Left 06/2009   decompression    Family Psychiatric History: See  below  Family History:  Family History  Problem Relation Age of Onset   Diabetes Mother    Brain cancer Mother    Diabetes Father    Heart attack Father    Heart disease Father    Diabetes Sister    Bipolar disorder Brother    Diabetes Brother    Heart disease Brother    Colon cancer Neg Hx    Breast cancer Neg Hx     Social History:  Social History   Socioeconomic History   Marital status: Divorced    Spouse name: Not on file   Number of children: 1   Years of education: 12   Highest education level: Not on file  Occupational History   Occupation: retired    Comment: employed at [employer]  Tobacco Use   Smoking status: Former    Current packs/day: 0.00    Average packs/day: 0.5 packs/day for 43.0 years (21.5 ttl pk-yrs)    Types: Cigarettes    Quit date: 2024  Years since quitting: 2.0   Smokeless tobacco: Never   Tobacco comments:    smoking since 74yrs old.    Vaping Use   Vaping status: Never Used  Substance and Sexual Activity   Alcohol use: No    Alcohol/week: 0.0 standard drinks of alcohol   Drug use: No   Sexual activity: Not Currently  Other Topics Concern   Not on file  Social History Narrative   Lives alone.  Retired.  Education 12th grade.  One child.     Caffeine use- sodas, 2 daily   Social Drivers of Health   Tobacco Use: Medium Risk (01/20/2024)   Patient History    Smoking Tobacco Use: Former    Smokeless Tobacco Use: Never    Passive Exposure: Not on file  Financial Resource Strain: Low Risk (11/10/2021)   [facility]   Overall Financial Resource Strain (CARDIA)    Difficulty of Paying Living Expenses: Not hard at all  Food Insecurity: No Food Insecurity (12/14/2023)   Epic    Worried About Radiation Protection Practitioner of Food in the Last Year: Never true    Ran Out of Food in the Last Year: Never true  Transportation Needs: No Transportation Needs (12/14/2023)   Epic    Lack of Transportation (Medical): No    Lack of  Transportation (Non-Medical): No  Physical Activity: Inactive (11/10/2021)   Received from [facility]   Exercise Vital Sign    On average, how many days per week do you engage in moderate to strenuous exercise (like a brisk walk)?: 0 days    On average, how many minutes do you engage in exercise at this level?: 0 min  Stress: No Stress Concern Present (11/10/2021)   Received from [facility] - Occupational Stress Questionnaire    Feeling of Stress : Not at all  Social Connections: Socially Isolated (11/19/2023)   Social Connection and Isolation Panel    Frequency of Communication with Friends and Family: More than three times a week    Frequency of Social Gatherings with Friends and Family: More than three times a week    Attends Religious Services: Never    Database Administrator or Organizations: No    Attends Banker Meetings: Never    Marital Status: Divorced  Depression (PHQ2-9): Low Risk (12/14/2023)   Depression (PHQ2-9)    PHQ-2 Score: 0  Alcohol Screen: Not on file  Housing: Low Risk (12/14/2023)   Epic    Unable to Pay for Housing in the Last Year: No    Number of Times Moved in the Last Year: 0    Homeless in the Last Year: No  Utilities: Not At Risk (12/14/2023)   Epic    Threatened with loss of utilities: No  Health Literacy: Medium Risk (11/10/2021)   Received from [facility] Literacy    How often do you need to have someone help you when you read instructions, pamphlets, or other written material from your doctor or pharmacy?: Sometimes    Allergies: Allergies[1]  Metabolic Disorder Labs: Lab Results  Component Value Date   HGBA1C 5.1 12/11/2023   MPG 100 12/11/2023   MPG 108 04/24/2015   No results found for: PROLACTIN Lab Results  Component Value Date   CHOL 178 02/17/2016   TRIG 229 (H) 02/17/2016   HDL 30 (L) 02/17/2016   CHOLHDL 5.9 (H) 02/17/2016  VLDL 46 (H)  02/17/2016   LDLCALC 102 (H) 02/17/2016   LDLCALC 126 (H) 11/18/2015   Lab Results  Component Value Date   TSH 0.978 12/11/2023   TSH 1.00 12/24/2021    Therapeutic Level Labs: Lab Results  Component Value Date   LITHIUM  0.8 08/29/2019   LITHIUM  0.4 (L) 10/13/2018   Lab Results  Component Value Date   VALPROATE 62.2 08/20/2016   VALPROATE 83.0 10/14/2015   No results found for: CBMZ  Current Medications: Current Outpatient Medications  Medication Sig Dispense Refill   acetaminophen  (TYLENOL ) 500 MG tablet Take 1,000 mg by mouth 2 (two) times daily as needed for moderate pain or headache.     buPROPion  (WELLBUTRIN  XL) 150 MG 24 hr tablet Take 1 tablet (150 mg total) by mouth every morning. 90 tablet 2   Cholecalciferol (VITAMIN D3) 50 MCG (2000 UT) TABS Take 2,000 Units by mouth daily.      diazepam  (VALIUM ) 2 MG tablet Take 1 tablet (2 mg total) by mouth daily as needed for anxiety. 90 tablet 2   estrogens , conjugated, (PREMARIN ) 0.625 MG tablet Take 0.625 mg by mouth daily.     ezetimibe  (ZETIA ) 10 MG tablet Take 10 mg by mouth at bedtime.     fluticasone (FLONASE) 50 MCG/ACT nasal spray Place 2 sprays into both nostrils daily as needed for rhinitis or allergies.     furosemide  (LASIX ) 40 MG tablet Take 1 tablet (40 mg total) by mouth daily. 30 tablet 11   hyoscyamine  (LEVSIN  SL) 0.125 MG SL tablet Place 1 tablet (0.125 mg total) under the tongue every 6 (six) hours as needed for cramping. 30 tablet 1   Lactobacillus-Inulin (CULTURELLE DIGESTIVE HEALTH) CAPS Take 1 capsule by mouth daily.     lactulose  (CHRONULAC ) 10 GM/15ML solution Take 45 mLs (30 g total) by mouth in the morning and at bedtime. 2700 mL 6   mirtazapine  (REMERON ) 15 MG tablet Take 1 tablet (15 mg total) by mouth at bedtime. 90 tablet 2   nitroGLYCERIN  (NITROSTAT ) 0.4 MG SL tablet Place 1 tablet (0.4 mg total) under the tongue every 5 (five) minutes as needed for chest pain. 25 tablet 3   omeprazole   (PRILOSEC) 40 MG capsule TAKE ONE (1) CAPSULE BY MOUTH TWICE DAILY AT BREAKFAST AND AT BEDTIME 60 capsule 11   ondansetron  (ZOFRAN ) 4 MG tablet TAKE 1 TABLET BY MOUTH EVERY 8 HOURS AS NEEDED FOR NAUSEA & VOMITING 30 tablet 10   Pancrelipase , Lip-Prot-Amyl, (ZENPEP ) 60000-189600 units CPEP Take 1 capsule by mouth 3 (three) times daily with meals. 100 capsule 11   propranolol  (INDERAL ) 10 MG tablet Take 1 tablet (10 mg total) by mouth 2 (two) times daily. 60 tablet 2   spironolactone  (ALDACTONE ) 100 MG tablet Take 1 tablet (100 mg total) by mouth daily. 30 tablet 11   trimethoprim  (TRIMPEX ) 100 MG tablet TAKE 1 TABLET BY MOUTH EVERY DAY AT BEDTIME 289 tablet 11   No current facility-administered medications for this visit.     Musculoskeletal: Strength & Muscle Tone: decreased Gait & Station: normal Patient leans: N/A  Psychiatric Specialty Exam: Review of Systems  Constitutional:  Positive for unexpected weight change.  Gastrointestinal:  Positive for diarrhea.  Genitourinary:  Positive for frequency.  Neurological:  Positive for tremors.    There were no vitals taken for this visit.There is no height or weight on file to calculate BMI.  General Appearance: Casual and Fairly Groomed  Eye Contact:  Good  Speech:  Clear and Coherent  Volume:  Normal  Mood:  Euthymic  Affect:  Congruent  Thought Process:  Goal Directed  Orientation:  Full (Time, Place, and Person)  Thought Content: WDL   Suicidal Thoughts:  No  Homicidal Thoughts:  No  Memory:  Immediate;   Good Recent;   Fair Remote;   NA  Judgement:  Fair  Insight:  Fair  Psychomotor Activity:  Decreased  Concentration:  Concentration: Good and Attention Span: Good  Recall:  Fair  Fund of Knowledge: Good  Language: Good  Akathisia:  No  Handed:  Right  AIMS (if indicated): not done  Assets:  Communication Skills Desire for Improvement Resilience Social Support  ADL's:  Intact  Cognition: WNL  Sleep:  Fair    Screenings: PHQ2-9    Flowsheet Row Telephone from 12/14/2023 in [place] [facility] Video Visit from 09/30/2021 in [facility] [place] Video Visit from 06/26/2021 in [facility] [place] Video Visit from 03[facility]26/2021 in [facility] at [place] Video Visit from 12/18/2020 in [facility] at [facility] Total Score 0 0 0 0 0   Flowsheet Row ED to Hosp-Admission (Discharged) from 12/10/2023 in [place] [facility] Admission (Discharged) from 11/19/2023 in [place] [facility] Admission (Discharged) from 04/19/2023 in [place] [facility]  C-SSRS RISK CATEGORY No Risk No Risk No Risk     Assessment and Plan: This patient is a 74 year old female with a history of bipolar disorder generalized anxiety disorder tremor muscle wasting.  Since the TIPS procedure she has been rehospitalized and her propranolol  has been cut back.  For now she will continue propranolol  10 mg twice daily for tremor.  She will continue mirtazapine  15 mg at bedtime for depression anxiety and sleep, Valium  2 mg once daily as needed for anxiety and Wellbutrin  XL 150 mg for depression.  She will return to see me in 3 months  Collaboration of Care: Collaboration of Care: Other provider involved in patient's care AEB notes are shared with GI on the epic system  Patient/Guardian was advised Release of Information must be obtained prior to any record release in order to collaborate their care with an outside provider. Patient/Guardian was advised if they have not already done so to contact the registration department to sign all necessary forms in order for us  to release information regarding their care.   Consent: Patient/Guardian gives verbal consent for treatment and assignment of benefits for services provided during this visit. Patient/Guardian expressed  understanding and agreed to proceed.    Barnie Gull, MD 01/25/2024, 1:32 PM     [1]  Allergies Allergen Reactions   Pramipexole Other (See Comments)    Shaking, palpitations, headache, faint feeling   Crestor [Rosuvastatin Calcium ] Other (See Comments)    Muscle pain   Bee Pollen Other (See Comments)    Eyes and nose run   Pollen Extract Other (See Comments)    Eyes and nose run

## 2024-01-28 ENCOUNTER — Other Ambulatory Visit (HOSPITAL_COMMUNITY)
Admission: RE | Admit: 2024-01-28 | Discharge: 2024-01-28 | Disposition: A | Discharge status: Home / Self Care | Source: Ambulatory Visit | Attending: Gastroenterology | Admitting: Gastroenterology

## 2024-01-28 ENCOUNTER — Ambulatory Visit (HOSPITAL_COMMUNITY)
Admission: RE | Admit: 2024-01-28 | Discharge: 2024-01-28 | Disposition: A | Discharge status: Home / Self Care | Source: Ambulatory Visit | Attending: Gastroenterology | Admitting: Gastroenterology

## 2024-01-28 DIAGNOSIS — K7460 Unspecified cirrhosis of liver: Secondary | ICD-10-CM | POA: Insufficient documentation

## 2024-01-28 DIAGNOSIS — R188 Other ascites: Secondary | ICD-10-CM | POA: Diagnosis present

## 2024-01-28 DIAGNOSIS — D649 Anemia, unspecified: Secondary | ICD-10-CM | POA: Diagnosis present

## 2024-01-28 LAB — CBC
HCT: 31.7 % — ABNORMAL LOW (ref 36.0–46.0)
Hemoglobin: 10.7 g/dL — ABNORMAL LOW (ref 12.0–15.0)
MCH: 29.9 pg (ref 26.0–34.0)
MCHC: 33.8 g/dL (ref 30.0–36.0)
MCV: 88.5 fL (ref 80.0–100.0)
Platelets: 79 K/uL — ABNORMAL LOW (ref 150–400)
RBC: 3.58 MIL/uL — ABNORMAL LOW (ref 3.87–5.11)
RDW: 15.6 % — ABNORMAL HIGH (ref 11.5–15.5)
WBC: 3.8 K/uL — ABNORMAL LOW (ref 4.0–10.5)
nRBC: 0 % (ref 0.0–0.2)

## 2024-01-28 LAB — COMPREHENSIVE METABOLIC PANEL WITH GFR
ALT: 27 U/L (ref 0–44)
AST: 44 U/L — ABNORMAL HIGH (ref 15–41)
Albumin: 3.8 g/dL (ref 3.5–5.0)
Alkaline Phosphatase: 235 U/L — ABNORMAL HIGH (ref 38–126)
Anion gap: 12 (ref 5–15)
BUN: 8 mg/dL (ref 8–23)
CO2: 22 mmol/L (ref 22–32)
Calcium: 9.4 mg/dL (ref 8.9–10.3)
Chloride: 101 mmol/L (ref 98–111)
Creatinine, Ser: 0.95 mg/dL (ref 0.44–1.00)
GFR, Estimated: >60 mL/min
Glucose, Bld: 289 mg/dL — ABNORMAL HIGH (ref 70–99)
Potassium: 4.6 mmol/L (ref 3.5–5.1)
Sodium: 135 mmol/L (ref 135–145)
Total Bilirubin: 2.7 mg/dL — ABNORMAL HIGH (ref 0.0–1.2)
Total Protein: 6.6 g/dL (ref 6.5–8.1)

## 2024-01-28 LAB — IRON AND TIBC
Iron: 66 ug/dL (ref 28–170)
Saturation Ratios: 19 % (ref 10.4–31.8)
TIBC: 344 ug/dL (ref 250–450)
UIBC: 278 ug/dL

## 2024-01-28 LAB — PROTIME-INR
INR: 1.3 — ABNORMAL HIGH (ref 0.8–1.2)
Prothrombin Time: 16.9 s — ABNORMAL HIGH (ref 11.4–15.2)

## 2024-01-28 LAB — FERRITIN: Ferritin: 30 ng/mL (ref 11–307)

## 2024-01-29 LAB — AFP TUMOR MARKER: AFP, Serum, Tumor Marker: <1.8 ng/mL (ref 0.0–9.2)

## 2024-01-30 ENCOUNTER — Ambulatory Visit: Payer: Self-pay | Admitting: Gastroenterology

## 2024-02-07 ENCOUNTER — Ambulatory Visit: Payer: Self-pay | Admitting: Gastroenterology

## 2024-02-07 DIAGNOSIS — R748 Abnormal levels of other serum enzymes: Secondary | ICD-10-CM

## 2024-02-07 DIAGNOSIS — K7460 Unspecified cirrhosis of liver: Secondary | ICD-10-CM

## 2024-02-09 ENCOUNTER — Other Ambulatory Visit: Payer: Self-pay | Admitting: *Deleted

## 2024-02-09 DIAGNOSIS — R748 Abnormal levels of other serum enzymes: Secondary | ICD-10-CM

## 2024-02-09 DIAGNOSIS — R188 Other ascites: Secondary | ICD-10-CM

## 2024-02-10 ENCOUNTER — Ambulatory Visit: Admitting: Student

## 2024-02-16 ENCOUNTER — Ambulatory Visit: Admitting: Student

## 2024-04-19 ENCOUNTER — Ambulatory Visit: Admitting: Gastroenterology

## 2024-04-27 ENCOUNTER — Ambulatory Visit: Admitting: Student
